# Patient Record
Sex: Female | Born: 1965 | Race: White | Hispanic: No | Marital: Married | Smoking: Never smoker
Health system: Southern US, Community
[De-identification: ages and names within clinical notes are randomized; demographics above are authoritative.]

## PROBLEM LIST (undated history)

## (undated) DIAGNOSIS — R195 Other fecal abnormalities: Secondary | ICD-10-CM

## (undated) DIAGNOSIS — C799 Secondary malignant neoplasm of unspecified site: Secondary | ICD-10-CM

## (undated) DIAGNOSIS — K5790 Diverticulosis of intestine, part unspecified, without perforation or abscess without bleeding: Secondary | ICD-10-CM

## (undated) DIAGNOSIS — C801 Malignant (primary) neoplasm, unspecified: Secondary | ICD-10-CM

## (undated) DIAGNOSIS — D259 Leiomyoma of uterus, unspecified: Secondary | ICD-10-CM

## (undated) DIAGNOSIS — K635 Polyp of colon: Secondary | ICD-10-CM

## (undated) DIAGNOSIS — K631 Perforation of intestine (nontraumatic): Secondary | ICD-10-CM

## (undated) DIAGNOSIS — K449 Diaphragmatic hernia without obstruction or gangrene: Secondary | ICD-10-CM

## (undated) DIAGNOSIS — R102 Pelvic and perineal pain: Secondary | ICD-10-CM

## (undated) HISTORY — PX: ESOPHAGOGASTRODUODENOSCOPY: SHX1529

## (undated) HISTORY — PX: COLONOSCOPY: SHX174

## (undated) HISTORY — DX: Secondary malignant neoplasm of unspecified site: C79.9

## (undated) HISTORY — DX: Malignant (primary) neoplasm, unspecified: C80.1

## (undated) HISTORY — DX: Pelvic and perineal pain: R10.2

## (undated) HISTORY — DX: Perforation of intestine (nontraumatic): K63.1

## (undated) HISTORY — PX: TONSILLECTOMY: SUR1361

## (undated) HISTORY — DX: Other fecal abnormalities: R19.5

---

## 2004-06-21 ENCOUNTER — Ambulatory Visit: Payer: Self-pay | Admitting: Obstetrics and Gynecology

## 2004-07-03 ENCOUNTER — Ambulatory Visit: Payer: Self-pay | Admitting: Obstetrics and Gynecology

## 2005-04-08 ENCOUNTER — Emergency Department: Payer: Self-pay | Admitting: Emergency Medicine

## 2005-04-08 ENCOUNTER — Other Ambulatory Visit: Payer: Self-pay

## 2005-08-09 ENCOUNTER — Ambulatory Visit: Payer: Self-pay | Admitting: Obstetrics and Gynecology

## 2006-07-24 ENCOUNTER — Ambulatory Visit: Payer: Self-pay

## 2007-08-17 ENCOUNTER — Ambulatory Visit: Payer: Self-pay | Admitting: Obstetrics and Gynecology

## 2008-07-14 ENCOUNTER — Ambulatory Visit: Payer: Self-pay | Admitting: Obstetrics and Gynecology

## 2009-07-18 ENCOUNTER — Ambulatory Visit: Payer: Self-pay | Admitting: Obstetrics and Gynecology

## 2010-07-25 ENCOUNTER — Ambulatory Visit: Payer: Self-pay | Admitting: Obstetrics and Gynecology

## 2011-07-30 ENCOUNTER — Ambulatory Visit: Payer: Self-pay | Admitting: Obstetrics and Gynecology

## 2012-07-30 ENCOUNTER — Ambulatory Visit: Payer: Self-pay | Admitting: Obstetrics and Gynecology

## 2013-08-02 ENCOUNTER — Ambulatory Visit: Payer: Self-pay | Admitting: Obstetrics and Gynecology

## 2014-08-01 DIAGNOSIS — R195 Other fecal abnormalities: Secondary | ICD-10-CM

## 2014-08-01 HISTORY — DX: Other fecal abnormalities: R19.5

## 2014-08-03 ENCOUNTER — Ambulatory Visit: Payer: Self-pay | Admitting: Obstetrics and Gynecology

## 2014-08-22 ENCOUNTER — Ambulatory Visit: Payer: Self-pay | Admitting: Gastroenterology

## 2014-12-19 LAB — SURGICAL PATHOLOGY

## 2015-03-15 ENCOUNTER — Emergency Department
Admission: EM | Admit: 2015-03-15 | Discharge: 2015-03-15 | Disposition: A | Discharge status: Home / Self Care | Payer: Managed Care, Other (non HMO) | Attending: Emergency Medicine | Admitting: Emergency Medicine

## 2015-03-15 ENCOUNTER — Encounter: Payer: Self-pay | Admitting: Urgent Care

## 2015-03-15 DIAGNOSIS — Z880 Allergy status to penicillin: Secondary | ICD-10-CM | POA: Diagnosis not present

## 2015-03-15 DIAGNOSIS — R197 Diarrhea, unspecified: Secondary | ICD-10-CM | POA: Diagnosis not present

## 2015-03-15 DIAGNOSIS — R1033 Periumbilical pain: Secondary | ICD-10-CM | POA: Insufficient documentation

## 2015-03-15 DIAGNOSIS — R110 Nausea: Secondary | ICD-10-CM | POA: Insufficient documentation

## 2015-03-15 LAB — CBC WITH DIFFERENTIAL/PLATELET
BASOS ABS: 0 10*3/uL (ref 0–0.1)
Basophils Relative: 0 %
EOS ABS: 0 10*3/uL (ref 0–0.7)
Eosinophils Relative: 0 %
HEMATOCRIT: 39 % (ref 35.0–47.0)
HEMOGLOBIN: 13.2 g/dL (ref 12.0–16.0)
LYMPHS ABS: 0.7 10*3/uL — AB (ref 1.0–3.6)
LYMPHS PCT: 8 %
MCH: 33.5 pg (ref 26.0–34.0)
MCHC: 33.8 g/dL (ref 32.0–36.0)
MCV: 98.9 fL (ref 80.0–100.0)
MONOS PCT: 4 %
Monocytes Absolute: 0.4 10*3/uL (ref 0.2–0.9)
Neutro Abs: 7.9 10*3/uL — ABNORMAL HIGH (ref 1.4–6.5)
Neutrophils Relative %: 88 %
Platelets: 250 10*3/uL (ref 150–440)
RBC: 3.94 MIL/uL (ref 3.80–5.20)
RDW: 12.7 % (ref 11.5–14.5)
WBC: 9.1 10*3/uL (ref 3.6–11.0)

## 2015-03-15 LAB — URINALYSIS COMPLETE WITH MICROSCOPIC (ARMC ONLY)
Bacteria, UA: NONE SEEN
Bilirubin Urine: NEGATIVE
Glucose, UA: NEGATIVE mg/dL
LEUKOCYTES UA: NEGATIVE
Nitrite: NEGATIVE
PH: 5 (ref 5.0–8.0)
PROTEIN: NEGATIVE mg/dL
SPECIFIC GRAVITY, URINE: 1.015 (ref 1.005–1.030)

## 2015-03-15 LAB — COMPREHENSIVE METABOLIC PANEL
ALBUMIN: 4 g/dL (ref 3.5–5.0)
ALT: 24 U/L (ref 14–54)
AST: 27 U/L (ref 15–41)
Alkaline Phosphatase: 39 U/L (ref 38–126)
Anion gap: 9 (ref 5–15)
BUN: 10 mg/dL (ref 6–20)
CO2: 27 mmol/L (ref 22–32)
CREATININE: 0.82 mg/dL (ref 0.44–1.00)
Calcium: 8.9 mg/dL (ref 8.9–10.3)
Chloride: 101 mmol/L (ref 101–111)
GFR calc Af Amer: >60 mL/min (ref >60)
GFR calc non Af Amer: >60 mL/min (ref >60)
Glucose, Bld: 109 mg/dL — ABNORMAL HIGH (ref 65–99)
Potassium: 3.4 mmol/L — ABNORMAL LOW (ref 3.5–5.1)
Sodium: 137 mmol/L (ref 135–145)
Total Bilirubin: 0.6 mg/dL (ref 0.3–1.2)
Total Protein: 7.3 g/dL (ref 6.5–8.1)

## 2015-03-15 LAB — LIPASE, BLOOD: LIPASE: 28 U/L (ref 22–51)

## 2015-03-15 MED ORDER — DICYCLOMINE HCL 20 MG PO TABS: 20.0000 mg | ORAL_TABLET | Freq: Three times a day (TID) | ORAL | Status: DC | PRN

## 2015-03-15 NOTE — Discharge Instructions (Signed)
Please seek medical attention for any high fevers, chest pain, shortness of breath, change in behavior, persistent vomiting, bloody stool or any other new or concerning symptoms.  Abdominal Pain Many things can cause abdominal pain. Usually, abdominal pain is not caused by a disease and will improve without treatment. It can often be observed and treated at home. Your health care provider will do a physical exam and possibly order blood tests and X-rays to help determine the seriousness of your pain. However, in many cases, more time must pass before a clear cause of the pain can be found. Before that point, your health care provider may not know if you need more testing or further treatment. HOME CARE INSTRUCTIONS  Monitor your abdominal pain for any changes. The following actions may help to alleviate any discomfort you are experiencing:  Only take over-the-counter or prescription medicines as directed by your health care provider.  Do not take laxatives unless directed to do so by your health care provider.  Try a clear liquid diet (broth, tea, or water) as directed by your health care provider. Slowly move to a bland diet as tolerated. SEEK MEDICAL CARE IF:  You have unexplained abdominal pain.  You have abdominal pain associated with nausea or diarrhea.  You have pain when you urinate or have a bowel movement.  You experience abdominal pain that wakes you in the night.  You have abdominal pain that is worsened or improved by eating food.  You have abdominal pain that is worsened with eating fatty foods.  You have a fever. SEEK IMMEDIATE MEDICAL CARE IF:   Your pain does not go away within 2 hours.  You keep throwing up (vomiting).  Your pain is felt only in portions of the abdomen, such as the right side or the left lower portion of the abdomen.  You pass bloody or black tarry stools. MAKE SURE YOU:  Understand these instructions.   Will watch your condition.   Will  get help right away if you are not doing well or get worse.  Document Released: 05/22/2005 Document Revised: 08/17/2013 Document Reviewed: 04/21/2013 [facility] Patient Information 2015 [place], [place]. This information is not intended to replace advice given to you by your health care provider. Make sure you discuss any questions you have with your health care provider.

## 2015-03-15 NOTE — ED Notes (Signed)
Patient presents with c/o mid-abdominal pain that began earlier today. (+) diarrhea and nausea. Denies urinary symptoms.

## 2015-03-15 NOTE — ED Provider Notes (Signed)
[facility] Emergency Department Provider Note   ____________________________________________  Time seen: 1925  I have reviewed the triage vital signs and the nursing notes.   HISTORY  Chief Complaint Abdominal Pain and Diarrhea   History limited by: Not Limited   HPI Anna [name] is a 49 y.o. female who presents to the emergency department today because of concerns of abdominal pain and some nausea. The patient states that the pain started early this morning. She states it is located periumbilically. It was mild. She was able to go to work. She states towards the end of work and started to get worse. She did note 3 episodes of diarrhea today. These were all nonbloody. Patient states the pain never changed location or radiated. She denies similar pain in the past. Denies any recent fevers. Denies any recent travel or unusual oral intake.    History reviewed. No pertinent past medical history.  There are no active problems to display for this patient.   Past Surgical History  Procedure Laterality Date  . Tonsillectomy      No current outpatient prescriptions on file.  Allergies Penicillins  No family history on file.  Social History History  Substance Use Topics  . Smoking status: Never Smoker   . Smokeless tobacco: Not on file  . Alcohol Use: No    Review of Systems  Constitutional: Negative for fever. Cardiovascular: Negative for chest pain. Respiratory: Negative for shortness of breath. Gastrointestinal: Positive for periumbilical pain, nausea and diarrhea Genitourinary: Negative for dysuria. Musculoskeletal: Negative for back pain. Skin: Negative for rash. Neurological: Negative for headaches, focal weakness or numbness.   10-point ROS otherwise negative.  ____________________________________________   PHYSICAL EXAM:  VITAL SIGNS: ED Triage Vitals  Enc Vitals Group     BP 03/15/15 1857 153/89 mmHg     Pulse Rate  03/15/15 1857 105     Resp 03/15/15 1857 20     Temp 03/15/15 1857 98.3 F (36.8 C)     Temp Source 03/15/15 1857 Oral     SpO2 03/15/15 1857 100 %     Weight 03/15/15 1857 140 lb (63.504 kg)     Height 03/15/15 1857 5\' 3"  (1.6 m)     Head Cir --      Peak Flow --      Pain Score 03/15/15 1857 4   Constitutional: Alert and oriented. Well appearing and in no distress. Eyes: Conjunctivae are normal. PERRL. Normal extraocular movements. ENT   Head: Normocephalic and atraumatic.   Nose: No congestion/rhinnorhea.   Mouth/Throat: Mucous membranes are moist.   Neck: No stridor. Hematological/Lymphatic/Immunilogical: No cervical lymphadenopathy. Cardiovascular: Normal rate, regular rhythm.  No murmurs, rubs, or gallops. Respiratory: Normal respiratory effort without tachypnea nor retractions. Breath sounds are clear and equal bilaterally. No wheezes/rales/rhonchi. Gastrointestinal: Soft and nontender. No rebound. No guarding. No distention. There is no CVA tenderness. Genitourinary: Deferred Musculoskeletal: Normal range of motion in all extremities. No joint effusions.  No lower extremity tenderness nor edema. Neurologic:  Normal speech and language. No gross focal neurologic deficits are appreciated. Speech is normal.  Skin:  Skin is warm, dry and intact. No rash noted. Psychiatric: Mood and affect are normal. Speech and behavior are normal. Patient exhibits appropriate insight and judgment.  ____________________________________________    LABS (pertinent positives/negatives)  Labs Reviewed  CBC WITH DIFFERENTIAL/PLATELET - Abnormal; Notable for the following:    Neutro Abs 7.9 (*)    Lymphs Abs 0.7 (*)    All  other components within normal limits  COMPREHENSIVE METABOLIC PANEL - Abnormal; Notable for the following:    Potassium 3.4 (*)    Glucose, Bld 109 (*)    All other components within normal limits  URINALYSIS COMPLETEWITH MICROSCOPIC ([facility] ONLY) - Abnormal;  Notable for the following:    Color, Urine YELLOW (*)    APPearance CLEAR (*)    Ketones, ur 1+ (*)    Hgb urine dipstick 2+ (*)    Squamous Epithelial / LPF 0-5 (*)    All other components within normal limits  LIPASE, BLOOD     ____________________________________________   EKG  None  ____________________________________________    RADIOLOGY  None  ____________________________________________   PROCEDURES  Procedure(s) performed: None  Critical Care performed: No  ____________________________________________   INITIAL IMPRESSION / ASSESSMENT AND PLAN / ED COURSE  Pertinent labs & imaging results that were available during my care of the patient were reviewed by me and considered in my medical decision making (see chart for details).  Patient presents to the emergency department today because of abdominal pain, nausea and diarrhea. Physical exam benign without any concerning findings. Will recheck basic blood work and urine.  No concerning findings on blood or urine. Patient without any recurrence pain. Will plan on discharging home. Discussed return precautions with patient.  ____________________________________________   FINAL CLINICAL IMPRESSION(S) / ED DIAGNOSES  Final diagnoses:  Periumbilical abdominal pain     Nance Pear, MD 03/15/15 2117

## 2015-06-15 ENCOUNTER — Other Ambulatory Visit: Payer: Self-pay | Admitting: Obstetrics and Gynecology

## 2015-06-15 DIAGNOSIS — Z1231 Encounter for screening mammogram for malignant neoplasm of breast: Secondary | ICD-10-CM

## 2015-08-07 ENCOUNTER — Ambulatory Visit
Admission: RE | Admit: 2015-08-07 | Discharge: 2015-08-07 | Disposition: A | Discharge status: Home / Self Care | Payer: Managed Care, Other (non HMO) | Source: Ambulatory Visit | Attending: Obstetrics and Gynecology | Admitting: Obstetrics and Gynecology

## 2015-08-07 DIAGNOSIS — Z1231 Encounter for screening mammogram for malignant neoplasm of breast: Secondary | ICD-10-CM | POA: Diagnosis not present

## 2016-07-08 ENCOUNTER — Other Ambulatory Visit: Payer: Self-pay | Admitting: Obstetrics and Gynecology

## 2016-07-08 DIAGNOSIS — Z1231 Encounter for screening mammogram for malignant neoplasm of breast: Secondary | ICD-10-CM

## 2016-08-14 ENCOUNTER — Ambulatory Visit
Admission: RE | Admit: 2016-08-14 | Discharge: 2016-08-14 | Disposition: A | Discharge status: Home / Self Care | Payer: Managed Care, Other (non HMO) | Source: Ambulatory Visit | Attending: Obstetrics and Gynecology | Admitting: Obstetrics and Gynecology

## 2016-08-14 DIAGNOSIS — Z1231 Encounter for screening mammogram for malignant neoplasm of breast: Secondary | ICD-10-CM | POA: Diagnosis not present

## 2016-12-11 ENCOUNTER — Encounter
Admission: RE | Admit: 2016-12-11 | Discharge: 2016-12-11 | Disposition: A | Discharge status: Home / Self Care | Payer: Managed Care, Other (non HMO) | Source: Ambulatory Visit | Attending: Obstetrics and Gynecology | Admitting: Obstetrics and Gynecology

## 2016-12-11 DIAGNOSIS — Z0183 Encounter for blood typing: Secondary | ICD-10-CM | POA: Insufficient documentation

## 2016-12-11 DIAGNOSIS — Z01812 Encounter for preprocedural laboratory examination: Secondary | ICD-10-CM | POA: Diagnosis present

## 2016-12-11 DIAGNOSIS — N83292 Other ovarian cyst, left side: Secondary | ICD-10-CM | POA: Diagnosis not present

## 2016-12-11 HISTORY — DX: Polyp of colon: K63.5

## 2016-12-11 HISTORY — DX: Diverticulosis of intestine, part unspecified, without perforation or abscess without bleeding: K57.90

## 2016-12-11 HISTORY — DX: Diaphragmatic hernia without obstruction or gangrene: K44.9

## 2016-12-11 HISTORY — DX: Leiomyoma of uterus, unspecified: D25.9

## 2016-12-11 LAB — BASIC METABOLIC PANEL
Anion gap: 10 (ref 5–15)
BUN: 9 mg/dL (ref 6–20)
CHLORIDE: 100 mmol/L — AB (ref 101–111)
CO2: 30 mmol/L (ref 22–32)
Calcium: 9.3 mg/dL (ref 8.9–10.3)
Creatinine, Ser: 0.71 mg/dL (ref 0.44–1.00)
GFR calc non Af Amer: >60 mL/min (ref >60)
Glucose, Bld: 87 mg/dL (ref 65–99)
POTASSIUM: 3.4 mmol/L — AB (ref 3.5–5.1)
Sodium: 140 mmol/L (ref 135–145)

## 2016-12-11 LAB — CBC
HEMATOCRIT: 39 % (ref 35.0–47.0)
Hemoglobin: 13.1 g/dL (ref 12.0–16.0)
MCH: 31.4 pg (ref 26.0–34.0)
MCHC: 33.6 g/dL (ref 32.0–36.0)
MCV: 93.3 fL (ref 80.0–100.0)
Platelets: 444 10*3/uL — ABNORMAL HIGH (ref 150–440)
RBC: 4.18 MIL/uL (ref 3.80–5.20)
RDW: 12.3 % (ref 11.5–14.5)
WBC: 10.1 10*3/uL (ref 3.6–11.0)

## 2016-12-11 LAB — TYPE AND SCREEN
ABO/RH(D): A POS
Antibody Screen: NEGATIVE

## 2016-12-11 NOTE — H&P (Signed)
[name] is a 51 y.o. female here L/S LSO  And right salpingectomy . Follow up for Complex left ovarian mass ( 4.2 x 3.9 cm ). Ca125 , AFP , LDH estradiol all wnl . Mass is mainly solid . Morphology index =4 ( 0.3% cancer risk) Evaluation and Management of Ultrasonographically detected ovarian tumors in asymptomatic , [facility] vol 127, no.5, may 2016  Past Medical History:  has a past medical history of Colon polyp (08/22/14); Diverticulosis (08/22/14); Fibroid uterus; Fibroma of foot, right; and HH (hiatus hernia) (08/22/14).  Past Surgical History:  has a past surgical history that includes Tonsillectomy; Colonoscopy (08/22/14); and egd (08/22/14). Family History: family history includes Asthma in her father; Cancer in her mother; Colon cancer in her mother; Diabetes in her mother; High blood pressure (Hypertension) in her mother. Social History:  reports that she has never smoked. She has never used smokeless tobacco. She reports that she does not drink alcohol or use drugs. OB/GYN History:          OB History    Gravida Para Term Preterm AB Living   0 0 0 0 0 0   SAB TAB Ectopic Molar Multiple Live Births   0 0 0   0        Allergies: is allergic to penicillin. Medications:  Current Outpatient Prescriptions:  .  simethicone (MYLICON) 80 MG chewable tablet, Take 1 tablet (80 mg total) by mouth 4 (four) times daily., Disp: 120 tablet, Rfl: 2  Review of Systems: General:                      No fatigue or weight loss Eyes:                           No vision changes Ears:                            No hearing difficulty Respiratory:                No cough or shortness of breath Pulmonary:                  No asthma or shortness of breath Cardiovascular:           No chest pain, palpitations, dyspnea on exertion Gastrointestinal:          No abdominal bloating, chronic diarrhea, constipations, masses, pain or hematochezia Genitourinary:             No  hematuria, dysuria, abnormal vaginal discharge, pelvic pain, Menometrorrhagia Lymphatic:                   No swollen lymph nodes Musculoskeletal:         No muscle weakness Neurologic:                  No extremity weakness, syncope, seizure disorder Psychiatric:                  No history of depression, delusions or suicidal/homicidal ideation    Exam:      Vitals:   11/28/16 1433  BP: (!) 145/96  Pulse: 89    Body mass index is 25.86 kg/m.  WDWN white/ female in NAD   Lungs: CTA  CV : RRR without murmur   Pelvic : V/V : nl cx no lesions  UTX : NSSC  No adnexal mass    Impression:   The encounter diagnosis was Complex cyst of left ovary. Mainly solid  Normal tumor marker   Plan:    L/S LSO and right salpingectomy possible BSO Pt leaning towards former and will  .   risks of the procedure discussed with the pt   .  Caroline Sauger, MD       Electronically signed by Caroline Sauger, MD at 11/28/2016 5:14 PM      Office Visit on 11/28/2016       Note viewed by patient  Department   Name Address Phone Fax  [facility] [address]

## 2016-12-11 NOTE — Patient Instructions (Signed)
  Your procedure is scheduled on: Friday, December 20, 2016 Report to Same Day Surgery 2nd floor medical mall ([place] Entrance-take elevator on left to 2nd floor.  Check in with surgery information desk.) To find out your arrival time please call [phone] between 1PM - 3PM on Thursday, April 26th  Remember: Instructions that are not followed completely may result in serious medical risk, up to and including death, or upon the discretion of your surgeon and anesthesiologist your surgery may need to be rescheduled.    _x___ 1. Do not eat food or drink liquids after midnight. No gum chewing or hard candies.      __x__ 2. No Alcohol for 24 hours before or after surgery.   __x__3. No Smoking for 24 prior to surgery.   ____  4. Bring all medications with you on the day of surgery if instructed.    __x__ 5. Notify your doctor if there is any change in your medical condition     (cold, fever, infections).     Do not wear jewelry, make-up, hairpins, clips or nail polish.  Do not wear lotions, powders, or perfumes. You may wear deodorant.  Do not shave 48 hours prior to surgery. Men may shave face and neck.  Do not bring valuables to the hospital.    [facility] is not responsible for any belongings or valuables.               Contacts, dentures or bridgework may not be worn into surgery.     Patients discharged the day of surgery will not be allowed to drive home.  You will need someone to drive you home and stay with you the night of your procedure.    Please read over the following fact sheets that you were given:   [facility] Preparing for Surgery and or MRSA Information   _x___ Take no medications the morning of surgery     _x___ Use CHG Soap or sage wipes as directed on instruction sheet   X____Stop Anti-inflammatories such as Advil, Aleve, Ibuprofen, Motrin, Naproxen, Naprosyn, Goodies powders or aspirin products. OK to take Tylenol.   _x___ Stop supplements until after  surgery.

## 2016-12-20 ENCOUNTER — Encounter
Admission: AD | Disposition: A | Discharge status: Home / Self Care | Payer: Self-pay | Source: Ambulatory Visit | Attending: Obstetrics and Gynecology

## 2016-12-20 ENCOUNTER — Ambulatory Visit: Payer: Managed Care, Other (non HMO) | Admitting: Anesthesiology

## 2016-12-20 ENCOUNTER — Inpatient Hospital Stay
Admission: AD | Admit: 2016-12-20 | Discharge: 2016-12-21 | DRG: 743 | Disposition: A | Discharge status: Home / Self Care | Payer: Managed Care, Other (non HMO) | Source: Ambulatory Visit | Attending: Obstetrics and Gynecology | Admitting: Obstetrics and Gynecology

## 2016-12-20 ENCOUNTER — Encounter: Payer: Self-pay | Admitting: *Deleted

## 2016-12-20 DIAGNOSIS — D259 Leiomyoma of uterus, unspecified: Secondary | ICD-10-CM | POA: Diagnosis present

## 2016-12-20 DIAGNOSIS — Z880 Allergy status to penicillin: Secondary | ICD-10-CM | POA: Diagnosis not present

## 2016-12-20 DIAGNOSIS — N83292 Other ovarian cyst, left side: Principal | ICD-10-CM | POA: Diagnosis present

## 2016-12-20 DIAGNOSIS — C799 Secondary malignant neoplasm of unspecified site: Secondary | ICD-10-CM | POA: Diagnosis present

## 2016-12-20 DIAGNOSIS — C7962 Secondary malignant neoplasm of left ovary: Secondary | ICD-10-CM | POA: Diagnosis not present

## 2016-12-20 DIAGNOSIS — C786 Secondary malignant neoplasm of retroperitoneum and peritoneum: Secondary | ICD-10-CM | POA: Diagnosis not present

## 2016-12-20 DIAGNOSIS — N736 Female pelvic peritoneal adhesions (postinfective): Secondary | ICD-10-CM | POA: Diagnosis present

## 2016-12-20 DIAGNOSIS — R102 Pelvic and perineal pain: Secondary | ICD-10-CM

## 2016-12-20 DIAGNOSIS — N7093 Salpingitis and oophoritis, unspecified: Secondary | ICD-10-CM | POA: Diagnosis present

## 2016-12-20 DIAGNOSIS — C801 Malignant (primary) neoplasm, unspecified: Secondary | ICD-10-CM

## 2016-12-20 HISTORY — PX: SIGMOIDOSCOPY: SHX6686

## 2016-12-20 HISTORY — PX: LAPAROSCOPIC LYSIS OF ADHESIONS: SHX5905

## 2016-12-20 HISTORY — DX: Secondary malignant neoplasm of unspecified site: C79.9

## 2016-12-20 HISTORY — PX: CYSTOSCOPY: SHX5120

## 2016-12-20 HISTORY — PX: LAPAROSCOPIC BILATERAL SALPINGECTOMY: SHX5889

## 2016-12-20 LAB — ABO/RH: ABO/RH(D): A POS

## 2016-12-20 SURGERY — SALPINGECTOMY, BILATERAL, LAPAROSCOPIC
Anesthesia: General | Laterality: Left

## 2016-12-20 MED ORDER — SUGAMMADEX SODIUM 200 MG/2ML IV SOLN
INTRAVENOUS | Status: AC
  Filled 2016-12-20: qty 2

## 2016-12-20 MED ORDER — BUPIVACAINE HCL 0.5 % IJ SOLN
INTRAMUSCULAR | Status: DC | PRN
  Administered 2016-12-20: 13 mL

## 2016-12-20 MED ORDER — DEXTROSE 5 % IV SOLN
5.0000 mg/kg | INTRAVENOUS | Status: DC
  Administered 2016-12-20: 300 mg via INTRAVENOUS
  Filled 2016-12-20 (×2): qty 7.5

## 2016-12-20 MED ORDER — PHENYLEPHRINE HCL 10 MG/ML IJ SOLN
INTRAMUSCULAR | Status: AC
  Filled 2016-12-20: qty 1

## 2016-12-20 MED ORDER — FAMOTIDINE 20 MG PO TABS
20.0000 mg | ORAL_TABLET | Freq: Once | ORAL | Status: AC
  Administered 2016-12-20: 20 mg via ORAL

## 2016-12-20 MED ORDER — DEXAMETHASONE SODIUM PHOSPHATE 10 MG/ML IJ SOLN
INTRAMUSCULAR | Status: DC | PRN
  Administered 2016-12-20: 10 mg via INTRAVENOUS

## 2016-12-20 MED ORDER — FENTANYL CITRATE (PF) 100 MCG/2ML IJ SOLN
INTRAMUSCULAR | Status: AC
  Filled 2016-12-20: qty 2

## 2016-12-20 MED ORDER — LACTATED RINGERS IV SOLN
INTRAVENOUS | Status: DC
  Administered 2016-12-21: via INTRAVENOUS

## 2016-12-20 MED ORDER — KETOROLAC TROMETHAMINE 30 MG/ML IJ SOLN
INTRAMUSCULAR | Status: DC | PRN
  Administered 2016-12-20: 30 mg via INTRAVENOUS

## 2016-12-20 MED ORDER — ONDANSETRON HCL 4 MG/2ML IJ SOLN: 4.0000 mg | Freq: Once | INTRAMUSCULAR | Status: DC | PRN

## 2016-12-20 MED ORDER — HYDROMORPHONE HCL 1 MG/ML IJ SOLN
INTRAMUSCULAR | Status: AC
  Filled 2016-12-20: qty 1

## 2016-12-20 MED ORDER — ONDANSETRON HCL 4 MG/2ML IJ SOLN
INTRAMUSCULAR | Status: DC | PRN
  Administered 2016-12-20: 4 mg via INTRAVENOUS

## 2016-12-20 MED ORDER — FLUORESCEIN SODIUM 10 % IV SOLN
INTRAVENOUS | Status: DC | PRN
Start: 2016-12-20 — End: 2016-12-20
  Administered 2016-12-20: 50 mg via INTRAVENOUS

## 2016-12-20 MED ORDER — HYDROMORPHONE HCL 1 MG/ML IJ SOLN
INTRAMUSCULAR | Status: DC | PRN
  Administered 2016-12-20 (×3): 0.5 mg via INTRAVENOUS

## 2016-12-20 MED ORDER — OXYCODONE HCL 5 MG PO TABS
5.0000 mg | ORAL_TABLET | ORAL | Status: DC | PRN
  Administered 2016-12-21 (×2): 5 mg via ORAL
  Filled 2016-12-20 (×2): qty 1

## 2016-12-20 MED ORDER — ONDANSETRON HCL 4 MG/2ML IJ SOLN
INTRAMUSCULAR | Status: AC
  Filled 2016-12-20: qty 2

## 2016-12-20 MED ORDER — LIDOCAINE HCL (PF) 2 % IJ SOLN
INTRAMUSCULAR | Status: AC
  Filled 2016-12-20: qty 2

## 2016-12-20 MED ORDER — FENTANYL CITRATE (PF) 100 MCG/2ML IJ SOLN
INTRAMUSCULAR | Status: DC | PRN
  Administered 2016-12-20 (×4): 50 ug via INTRAVENOUS

## 2016-12-20 MED ORDER — MORPHINE SULFATE (PF) 4 MG/ML IV SOLN: 2.0000 mg | INTRAVENOUS | Status: DC | PRN

## 2016-12-20 MED ORDER — PROPOFOL 10 MG/ML IV BOLUS
INTRAVENOUS | Status: AC
  Filled 2016-12-20: qty 20

## 2016-12-20 MED ORDER — GLYCOPYRROLATE 0.2 MG/ML IJ SOLN
INTRAMUSCULAR | Status: AC
  Filled 2016-12-20: qty 1

## 2016-12-20 MED ORDER — FENTANYL CITRATE (PF) 100 MCG/2ML IJ SOLN: 25.0000 ug | INTRAMUSCULAR | Status: DC | PRN

## 2016-12-20 MED ORDER — SILVER NITRATE-POT NITRATE 75-25 % EX MISC
CUTANEOUS | Status: AC
  Filled 2016-12-20: qty 2

## 2016-12-20 MED ORDER — PHENYLEPHRINE HCL 10 MG/ML IJ SOLN
INTRAMUSCULAR | Status: DC | PRN
  Administered 2016-12-20: 100 ug via INTRAVENOUS

## 2016-12-20 MED ORDER — DEXAMETHASONE SODIUM PHOSPHATE 10 MG/ML IJ SOLN
INTRAMUSCULAR | Status: AC
  Filled 2016-12-20: qty 1

## 2016-12-20 MED ORDER — ACETAMINOPHEN 325 MG PO TABS: 650.0000 mg | ORAL_TABLET | ORAL | Status: DC | PRN

## 2016-12-20 MED ORDER — ROCURONIUM BROMIDE 50 MG/5ML IV SOLN
INTRAVENOUS | Status: AC
  Filled 2016-12-20: qty 1

## 2016-12-20 MED ORDER — EPHEDRINE SULFATE 50 MG/ML IJ SOLN
INTRAMUSCULAR | Status: AC
  Filled 2016-12-20: qty 1

## 2016-12-20 MED ORDER — FLUORESCEIN SODIUM 10 % IV SOLN
INTRAVENOUS | Status: AC
  Filled 2016-12-20: qty 5

## 2016-12-20 MED ORDER — MIDAZOLAM HCL 2 MG/2ML IJ SOLN
INTRAMUSCULAR | Status: AC
  Filled 2016-12-20: qty 2

## 2016-12-20 MED ORDER — PROPOFOL 10 MG/ML IV BOLUS
INTRAVENOUS | Status: DC | PRN
  Administered 2016-12-20: 130 mg via INTRAVENOUS

## 2016-12-20 MED ORDER — FAMOTIDINE 20 MG PO TABS
ORAL_TABLET | ORAL | Status: AC
  Filled 2016-12-20: qty 1

## 2016-12-20 MED ORDER — SUCCINYLCHOLINE CHLORIDE 20 MG/ML IJ SOLN
INTRAMUSCULAR | Status: AC
  Filled 2016-12-20: qty 1

## 2016-12-20 MED ORDER — SUGAMMADEX SODIUM 200 MG/2ML IV SOLN
INTRAVENOUS | Status: DC | PRN
  Administered 2016-12-20: 200 mg via INTRAVENOUS

## 2016-12-20 MED ORDER — LACTATED RINGERS IV SOLN
INTRAVENOUS | Status: DC
  Administered 2016-12-20: 06:00:00 via INTRAVENOUS

## 2016-12-20 MED ORDER — LIDOCAINE HCL (CARDIAC) 20 MG/ML IV SOLN
INTRAVENOUS | Status: DC | PRN
  Administered 2016-12-20: 60 mg via INTRAVENOUS

## 2016-12-20 MED ORDER — KETOROLAC TROMETHAMINE 30 MG/ML IJ SOLN
30.0000 mg | Freq: Three times a day (TID) | INTRAMUSCULAR | Status: DC | PRN
  Administered 2016-12-20: 30 mg via INTRAVENOUS
  Filled 2016-12-20: qty 1

## 2016-12-20 MED ORDER — KETOROLAC TROMETHAMINE 30 MG/ML IJ SOLN
INTRAMUSCULAR | Status: AC
  Filled 2016-12-20: qty 1

## 2016-12-20 MED ORDER — ACETAMINOPHEN NICU IV SYRINGE 10 MG/ML
INTRAVENOUS | Status: AC
  Filled 2016-12-20: qty 1

## 2016-12-20 MED ORDER — CLINDAMYCIN PHOSPHATE 900 MG/50ML IV SOLN
900.0000 mg | Freq: Three times a day (TID) | INTRAVENOUS | Status: DC
  Administered 2016-12-20 – 2016-12-21 (×3): 900 mg via INTRAVENOUS
  Filled 2016-12-20 (×5): qty 50

## 2016-12-20 MED ORDER — ACETAMINOPHEN 10 MG/ML IV SOLN
INTRAVENOUS | Status: DC | PRN
  Administered 2016-12-20: 1000 mg via INTRAVENOUS

## 2016-12-20 MED ORDER — BUPIVACAINE HCL (PF) 0.5 % IJ SOLN
INTRAMUSCULAR | Status: AC
  Filled 2016-12-20: qty 30

## 2016-12-20 MED ORDER — ROCURONIUM BROMIDE 100 MG/10ML IV SOLN
INTRAVENOUS | Status: DC | PRN
  Administered 2016-12-20: 10 mg via INTRAVENOUS
  Administered 2016-12-20: 30 mg via INTRAVENOUS
  Administered 2016-12-20: 10 mg via INTRAVENOUS
  Administered 2016-12-20: 15 mg via INTRAVENOUS
  Administered 2016-12-20 (×2): 5 mg via INTRAVENOUS
  Administered 2016-12-20: 10 mg via INTRAVENOUS

## 2016-12-20 MED ORDER — MIDAZOLAM HCL 2 MG/2ML IJ SOLN
INTRAMUSCULAR | Status: DC | PRN
  Administered 2016-12-20: 2 mg via INTRAVENOUS

## 2016-12-20 MED ORDER — PRENATAL MULTIVITAMIN CH
1.0000 | ORAL_TABLET | Freq: Every day | ORAL | Status: DC
  Administered 2016-12-20: 1 via ORAL
  Filled 2016-12-20: qty 1

## 2016-12-20 SURGICAL SUPPLY — 46 items
BAG URO DRAIN 2000ML W/SPOUT (MISCELLANEOUS) ×5 IMPLANT
BLADE SURG SZ11 CARB STEEL (BLADE) ×5 IMPLANT
CANISTER SUCT 1200ML W/VALVE (MISCELLANEOUS) ×5 IMPLANT
CATH ROBINSON RED A/P 16FR (CATHETERS) ×5 IMPLANT
CHLORAPREP W/TINT 26ML (MISCELLANEOUS) ×5 IMPLANT
CLOSURE WOUND 1/4X4 (GAUZE/BANDAGES/DRESSINGS) ×1
DISSECTOR KITTNER STICK (MISCELLANEOUS) ×3 IMPLANT
DISSECTORS/KITTNER STICK (MISCELLANEOUS) ×5
DRAPE INCISE 23X17 IOBAN STRL (DRAPES) ×2
DRAPE INCISE IOBAN 23X17 STRL (DRAPES) ×3 IMPLANT
DRSG TEGADERM 2-3/8X2-3/4 SM (GAUZE/BANDAGES/DRESSINGS) ×5 IMPLANT
ENDOPOUCH RETRIEVER 10 (MISCELLANEOUS) ×10 IMPLANT
GAUZE SPONGE NON-WVN 2X2 STRL (MISCELLANEOUS) ×3 IMPLANT
GLOVE BIO SURGEON STRL SZ8 (GLOVE) ×10 IMPLANT
GOWN STRL REUS W/ TWL LRG LVL3 (GOWN DISPOSABLE) ×6 IMPLANT
GOWN STRL REUS W/ TWL XL LVL3 (GOWN DISPOSABLE) ×3 IMPLANT
GOWN STRL REUS W/TWL LRG LVL3 (GOWN DISPOSABLE) ×4
GOWN STRL REUS W/TWL XL LVL3 (GOWN DISPOSABLE) ×2
GRASPER SUT TROCAR 14GX15 (MISCELLANEOUS) ×5 IMPLANT
IRRIGATION STRYKERFLOW (MISCELLANEOUS) IMPLANT
IRRIGATOR STRYKERFLOW (MISCELLANEOUS)
IV NS 1000ML (IV SOLUTION) ×2
IV NS 1000ML BAXH (IV SOLUTION) ×3 IMPLANT
JACKSON PRATT 7MM (INSTRUMENTS) ×5 IMPLANT
KIT RM TURNOVER CYSTO AR (KITS) ×5 IMPLANT
LABEL OR SOLS (LABEL) ×5 IMPLANT
NS IRRIG 500ML POUR BTL (IV SOLUTION) ×5 IMPLANT
PACK GYN LAPAROSCOPIC (MISCELLANEOUS) ×5 IMPLANT
PAD OB MATERNITY 4.3X12.25 (PERSONAL CARE ITEMS) ×5 IMPLANT
PAD PREP 24X41 OB/GYN DISP (PERSONAL CARE ITEMS) ×5 IMPLANT
SCISSORS METZENBAUM CVD 33 (INSTRUMENTS) ×5 IMPLANT
SET CYSTO W/LG BORE CLAMP LF (SET/KITS/TRAYS/PACK) ×5 IMPLANT
SHEARS HARMONIC ACE PLUS 36CM (ENDOMECHANICALS) ×5 IMPLANT
SLEEVE ENDOPATH XCEL 5M (ENDOMECHANICALS) ×5 IMPLANT
SPONGE VERSALON 2X2 STRL (MISCELLANEOUS) ×2
STRIP CLOSURE SKIN 1/4X4 (GAUZE/BANDAGES/DRESSINGS) ×4 IMPLANT
SUT VIC AB 0 CT2 27 (SUTURE) ×10 IMPLANT
SUT VIC AB 1 CT1 36 (SUTURE) ×5 IMPLANT
SUT VIC AB 2-0 UR6 27 (SUTURE) IMPLANT
SUT VIC AB 4-0 SH 27 (SUTURE)
SUT VIC AB 4-0 SH 27XANBCTRL (SUTURE) IMPLANT
SWABSTK COMLB BENZOIN TINCTURE (MISCELLANEOUS) ×5 IMPLANT
TROCAR ENDO BLADELESS 11MM (ENDOMECHANICALS) ×5 IMPLANT
TROCAR XCEL NON-BLD 5MMX100MML (ENDOMECHANICALS) ×5 IMPLANT
TROCAR XCEL UNIV SLVE 11M 100M (ENDOMECHANICALS) ×5 IMPLANT
TUBING INSUFFLATOR HI FLOW (MISCELLANEOUS) ×5 IMPLANT

## 2016-12-20 NOTE — Progress Notes (Signed)
Patient ID: Anna [name], female   DOB: 1966/06/05, 51 y.o.   MRN: [id number] Reviewed case with patient  Her pain is in good control  VSS  drain : serosanginous fluid  Good urine output A: stable  P; advance diet  Anticipate d/c tomorrow

## 2016-12-20 NOTE — Anesthesia Preprocedure Evaluation (Signed)
Anesthesia Evaluation  Patient identified by MRN, date of birth, ID band Patient awake    Reviewed: Allergy & Precautions, NPO status , Patient's Chart, lab work & pertinent test results, reviewed documented beta blocker date and time   Airway Mallampati: II  TM Distance: >3 FB     Dental  (+) Chipped   Pulmonary           Cardiovascular      Neuro/Psych  Neuromuscular disease    GI/Hepatic   Endo/Other    Renal/GU      Musculoskeletal   Abdominal   Peds  Hematology   Anesthesia Other Findings   Reproductive/Obstetrics                             Anesthesia Physical Anesthesia Plan  ASA: II  Anesthesia Plan: General   Post-op Pain Management:    Induction: Intravenous  Airway Management Planned: Oral ETT  Additional Equipment:   Intra-op Plan:   Post-operative Plan:   Informed Consent: I have reviewed the patients History and Physical, chart, labs and discussed the procedure including the risks, benefits and alternatives for the proposed anesthesia with the patient or authorized representative who has indicated his/her understanding and acceptance.     Plan Discussed with: CRNA  Anesthesia Plan Comments:         Anesthesia Quick Evaluation

## 2016-12-20 NOTE — Brief Op Note (Signed)
12/20/2016  10:56 AM  PATIENT:  Anna [name]  51 y.o. female  PRE-OPERATIVE DIAGNOSIS: pelvic pain ,   Complex left ovarian cyst  POST-OPERATIVE DIAGNOSIS: complex adnexal mass if chronic infection   PROCEDURE:  Laparoscopic BSO                            Extensive adhesiolysis of right adnexa from inferior colon                             Excision of anterior 5 cm calcified uterine fibroid                             Rigid sigmoidoscopy- Dr Hampton Abbot                             Placement of [place] peritoneal drain                              Cystoscopy                               peritoneal washings SURGEON:  Surgeon(s) and Role: Panel 1:    * Boykin Nearing, MD - Primary    * Honor Loh Ward, MD first assist    * Olean Ree, MD - Gen surgery consultant Panel 2:    * Olean Ree, MD - Primary  PHYSICIAN ASSISTANT:   ASSISTANTS: none   ANESTHESIA:   general  EBL:  Total I/O In: 1200 [I.V.:1200] Out: 400 [Urine:300; Blood:100]  BLOOD ADMINISTERED:none  DRAINS: Urinary Catheter (Foley)   LOCAL MEDICATIONS USED:  MARCAINE    and Amount: 5 ml  SPECIMEN:  Source of Specimen:  right tube and ovary with infectious process, left tube and ovary , peritoneal biopsy , peritoneal washings, calcified uterine fibroid   DISPOSITION OF SPECIMEN:  PATHOLOGY  COUNTS:  YES  TOURNIQUET:  * No tourniquets in log *  DICTATION: .Other Dictation: Dictation Number verbal  PLAN OF CARE: Admit to inpatient   PATIENT DISPOSITION:  PACU - hemodynamically stable.   Delay start of Pharmacological VTE agent (>24hrs) due to surgical blood loss or risk of bleeding: not applicable

## 2016-12-20 NOTE — Anesthesia Procedure Notes (Signed)
Procedure Name: Intubation Date/Time: 12/20/2016 7:41 AM Performed by: Darlyne Russian Pre-anesthesia Checklist: Patient identified, Emergency Drugs available, Suction available, Patient being monitored and Timeout performed Patient Re-evaluated:Patient Re-evaluated prior to inductionOxygen Delivery Method: Circle system utilized Preoxygenation: Pre-oxygenation with 100% oxygen Intubation Type: IV induction Ventilation: Mask ventilation without difficulty Laryngoscope Size: Mac and 3 Grade View: Grade I Tube type: Oral Tube size: 7.0 mm Number of attempts: 1 Airway Equipment and Method: Stylet Placement Confirmation: ETT inserted through vocal cords under direct vision,  positive ETCO2 and breath sounds checked- equal and bilateral Secured at: 21 cm Tube secured with: Tape Dental Injury: Teeth and Oropharynx as per pre-operative assessment

## 2016-12-20 NOTE — Op Note (Signed)
  Procedure Date:  12/20/2016  Pre-operative Diagnosis:  Complex left ovarian mass  Post-operative Diagnosis:  Complex left ovarian mass  Procedure:  Laparoscopic adhesiolysis of the complex left ovarian mass to the distal sigmoid colon and rectum, excision of complex left ovarian mass, excision of uterine fibroid, rigid sigmoidoscopy  Surgeon:  Melvyn Neth, MD  Assistant:  Laverta Baltimore, MD.  Anesthesia:  General endotracheal  Estimated Blood Loss:  10 ml  Specimens:  Complex left ovarian mass, uterine fibroid  Complications:  None  Indications for Procedure:  This is a 51 y.o. female who presented with pelvic pain and a complex left ovarian mass.  She was in the operating room today with Dr. Ouida Sills and Dr. Leonides Schanz.  During laparoscopy, it was noted that the mass was significantly adhered to the distal sigmoid colon and rectum, and General Surgery was asked to come into room for assistance with adhesiolysis.  Description of Procedure: The patient was already intubated and prepped/draped.  The patient had an umbilical port for camera, a 12 mm left lower quadrant port, and a 5 mm right lower quadrant port.  An additional 5 mm right upper quadrant port was placed for an additional instrument.  The sigmoid colon was retracted cephalad, with counter-tension being provided on the left ovarian mass.  Using the suction-irrigator, blunt dissection was performed in the plane between the colon and mass.  Laparoscopic scissors were also used for sharp dissection.  There was a significant amount of fibrosis and scarring, concerning for a possible prior abscess formation.  As we were dissecting distally through the adhesion, the ovarian mass was mobilized anteriorly and superiorly in order to free it from the abdominal wall.  The Harmonic device was used to do that dissection.  Careful attention was given to avoid the left iliac vessels.  The left ureter was not visualized but our dissection  plane was away from its usual location and away from the retroperitoneum.  As the mass was mobilized further, any remaining adhesions to the intestine were freed.  On further inspection, there was no injury to the bowel noted. The mass then was evident to be free off the abdominal wall and intestine and then the Harmonic was used to dissect it off the uterine wall.  The uterine fibroid was then dissected off the anterior / superior uterine wall using the Harmonic device, and placed in an EndoCatch bag and removed via the left lower quadrant incision.    The patient was then placed in reverse Trendelenburg position and the pelvis was filled with saline.  A rigid sigmoidoscope was introduced and insufflation leak test was performed.  The sigmoid colon was noted to be distended with air, and there were no bubbles coming from the pelvis, consistent with no bowel injury.  The air was desufflated and the sigmoidoscope was removed.  The fluid in the pelvis was suctioned out.  At that point, Dr. Leonides Schanz scrubbed in to continue helping Dr. Ouida Sills and I scrubbed out, completing my portion of the surgery.   Melvyn Neth, MD

## 2016-12-20 NOTE — Progress Notes (Signed)
ANTIBIOTIC CONSULT NOTE - INITIAL  Pharmacy Consult for Gentamicin Indication: Tubo-ovarian abscess  Allergies  Allergen Reactions  . Penicillins Rash    As a child. Has patient had a PCN reaction causing immediate rash, facial/tongue/throat swelling, SOB or lightheadedness with hypotension: Unknown Has patient had a PCN reaction causing severe rash involving mucus membranes or skin necrosis: Unknown Has patient had a PCN reaction that required hospitalization: No Has patient had a PCN reaction occurring within the last 10 years: No If all of the above answers are "NO", then may proceed with Cephalosporin use.     Patient Measurements: Height: 5\' 3"  (160 cm) Weight: 134 lb (60.8 kg) IBW/kg (Calculated) : 52.4   Vital Signs: Temp: 97.8 F (36.6 C) (04/27 1300) Temp Source: Axillary (04/27 1300) BP: 132/63 (04/27 1300) Pulse Rate: 84 (04/27 1300) Intake/Output from previous day: No intake/output data recorded. Intake/Output from this shift: Total I/O In: 1200 [I.V.:1200] Out: 520 [Urine:390; Drains:30; Blood:100]  Labs: No results for input(s): WBC, HGB, PLT, LABCREA, CREATININE in the last 72 hours. Estimated Creatinine Clearance: 68 mL/min (by C-G formula based on SCr of 0.71 mg/dL). No results for input(s): VANCOTROUGH, VANCOPEAK, VANCORANDOM, GENTTROUGH, GENTPEAK, GENTRANDOM, TOBRATROUGH, TOBRAPEAK, TOBRARND, AMIKACINPEAK, AMIKACINTROU, AMIKACIN in the last 72 hours.   Microbiology: No results found for this or any previous visit (from the past 720 hour(s)).  Medical History: Past Medical History:  Diagnosis Date  . Colon polyp   . Diverticulosis   . Fibroid uterus   . HH (hiatus hernia)     Medications:  Anti-infectives    Start     Dose/Rate Route Frequency Ordered Stop   12/20/16 1400  clindamycin (CLEOCIN) IVPB 900 mg     900 mg 100 mL/hr over 30 Minutes Intravenous Every 8 hours 12/20/16 1116     12/20/16 1300  gentamicin (GARAMYCIN) 300 mg in  dextrose 5 % 100 mL IVPB     5 mg/kg  60.8 kg 107.5 mL/hr over 60 Minutes Intravenous Every 24 hours 12/20/16 1116       Assessment: 51 yo female admitted with pelvic pain, underwent Lap BSO for complex ovarian cyst w/ abscess  Goal of Therapy:  Gentamicin trough level <2 mcg/ml  Plan:  Treat with Gentamicin 5 mg/kg q24hr in combination with Clindamycin .   Will follow up SCr and check random Gentamicin Level 10 hrs after start.  Dalon Reichart K, RPh 12/20/2016,1:39 PM

## 2016-12-20 NOTE — Anesthesia Postprocedure Evaluation (Signed)
Anesthesia Post Note  Patient: Anna [name]  Procedure(s) Performed: Procedure(s) (LRB): LAPAROSCOPIC BILATERAL SALPINGOOPHERECTOMY,  Excision of anterior 5 cm calcified uterine fibroid (Bilateral) CYSTOSCOPY LAPAROSCOPIC LYSIS OF ADHESIONS WITH DISSECTION OF LEFT TUBE AND OVARY. RIGID SIGMOIDOSCOPY  Patient location during evaluation: PACU Anesthesia Type: General Level of consciousness: awake and alert Pain management: pain level controlled Vital Signs Assessment: post-procedure vital signs reviewed and stable Respiratory status: spontaneous breathing, nonlabored ventilation, respiratory function stable and patient connected to nasal cannula oxygen Cardiovascular status: blood pressure returned to baseline and stable Postop Assessment: no signs of nausea or vomiting Anesthetic complications: no     Last Vitals:  Vitals:   12/20/16 1459 12/20/16 1603  BP: (!) 114/56 (!) 105/57  Pulse: 79 79  Resp: 16 16  Temp: 36.6 C 36.8 C    Last Pain:  Vitals:   12/20/16 1603  TempSrc: Oral  PainSc:                  Cameran Pettey S

## 2016-12-20 NOTE — Progress Notes (Signed)
Ready ready for l/s LSO , right salpingectomy and possible right oophorectomy .  Marland Kitchen NPO . All questions answered . Proceed

## 2016-12-20 NOTE — Transfer of Care (Signed)
Immediate Anesthesia Transfer of Care Note  Patient: [place]  Procedure(s) Performed: Procedure(s): LAPAROSCOPIC BILATERAL SALPINGOOPHERECTOMY,  Excision of anterior 5 cm calcified uterine fibroid (Bilateral) CYSTOSCOPY LAPAROSCOPIC LYSIS OF ADHESIONS WITH DISSECTION OF LEFT TUBE AND OVARY. RIGID SIGMOIDOSCOPY  Patient Location: PACU  Anesthesia Type:General  Level of Consciousness: responds to stimulation  Airway & Oxygen Therapy: Patient Spontanous Breathing and Patient connected to nasal cannula oxygen  Post-op Assessment: Report given to RN and Post -op Vital signs reviewed and stable  Post vital signs: Reviewed and stable  Last Vitals:  Vitals:   12/20/16 1114 12/20/16 1117  BP:  128/64  Pulse: 88 90  Resp:  17  Temp:      Last Pain:  Vitals:   12/20/16 0600  TempSrc: Oral         Complications: No apparent anesthesia complications

## 2016-12-20 NOTE — OR Nursing (Signed)
No abx per Dr. Ouida Sills verbal @ [phone]

## 2016-12-20 NOTE — OR Nursing (Signed)
Lab tech in for abo/rh draw @ [phone]

## 2016-12-20 NOTE — Anesthesia Post-op Follow-up Note (Cosign Needed)
Anesthesia QCDR form completed.        

## 2016-12-21 LAB — COMPREHENSIVE METABOLIC PANEL
ALBUMIN: 2.9 g/dL — AB (ref 3.5–5.0)
ALT: 21 U/L (ref 14–54)
ANION GAP: 7 (ref 5–15)
AST: 26 U/L (ref 15–41)
Alkaline Phosphatase: 72 U/L (ref 38–126)
BUN: 7 mg/dL (ref 6–20)
CO2: 30 mmol/L (ref 22–32)
Calcium: 8.7 mg/dL — ABNORMAL LOW (ref 8.9–10.3)
Chloride: 100 mmol/L — ABNORMAL LOW (ref 101–111)
Creatinine, Ser: 0.65 mg/dL (ref 0.44–1.00)
GFR calc Af Amer: >60 mL/min (ref >60)
GFR calc non Af Amer: >60 mL/min (ref >60)
GLUCOSE: 93 mg/dL (ref 65–99)
POTASSIUM: 4.4 mmol/L (ref 3.5–5.1)
Sodium: 137 mmol/L (ref 135–145)
Total Bilirubin: 0.5 mg/dL (ref 0.3–1.2)
Total Protein: 6.3 g/dL — ABNORMAL LOW (ref 6.5–8.1)

## 2016-12-21 LAB — CBC WITH DIFFERENTIAL/PLATELET
BASOS ABS: 0.1 10*3/uL (ref 0–0.1)
Basophils Relative: 0 %
EOS PCT: 0 %
Eosinophils Absolute: 0 10*3/uL (ref 0–0.7)
HEMATOCRIT: 34 % — AB (ref 35.0–47.0)
Hemoglobin: 11.4 g/dL — ABNORMAL LOW (ref 12.0–16.0)
LYMPHS ABS: 0.7 10*3/uL — AB (ref 1.0–3.6)
LYMPHS PCT: 4 %
MCH: 31.4 pg (ref 26.0–34.0)
MCHC: 33.5 g/dL (ref 32.0–36.0)
MCV: 93.6 fL (ref 80.0–100.0)
MONO ABS: 1 10*3/uL — AB (ref 0.2–0.9)
Monocytes Relative: 5 %
NEUTROS ABS: 17.5 10*3/uL — AB (ref 1.4–6.5)
Neutrophils Relative %: 91 %
PLATELETS: 458 10*3/uL — AB (ref 150–440)
RBC: 3.64 MIL/uL — AB (ref 3.80–5.20)
RDW: 12.5 % (ref 11.5–14.5)
WBC: 19.4 10*3/uL — AB (ref 3.6–11.0)

## 2016-12-21 LAB — GENTAMICIN LEVEL, RANDOM: Gentamicin Rm: 0.7 ug/mL

## 2016-12-21 MED ORDER — DOCUSATE SODIUM 100 MG PO CAPS: 100.0000 mg | ORAL_CAPSULE | Freq: Two times a day (BID) | ORAL | 0 refills | Status: DC

## 2016-12-21 NOTE — Discharge Summary (Addendum)
Physician Discharge Summary  Patient ID: Anna [name] MRN: [id number] DOB/AGE: [birth year] 51 y.o.  Admit date: 12/20/2016 Discharge date: 12/21/2016  Admission Diagnoses:complex left ovarian cyst   Discharge Diagnoses: probable left tubovarian abscess Significant pelvic adhesive ds    Discharged Condition: good  Hospital Course: underwent L/S BSO , adhesiolysis colon to left adnexa  Drain removed POD#1 . Gentamycin and cleocin for 24 hrs  Consults: general surgery intra op Dr Hampton Abbot   Significant Diagnostic Studies: labs: Results for orders placed or performed during the hospital encounter of 12/20/16 (from the past 24 hour(s))  Gentamicin level, random     Status: None   Collection Time: 12/21/16  1:51 AM  Result Value Ref Range   Gentamicin Rm 0.7 ug/mL  Comprehensive metabolic panel     Status: Abnormal   Collection Time: 12/21/16  1:51 AM  Result Value Ref Range   Sodium 137 135 - 145 mmol/L   Potassium 4.4 3.5 - 5.1 mmol/L   Chloride 100 (L) 101 - 111 mmol/L   CO2 30 22 - 32 mmol/L   Glucose, Bld 93 65 - 99 mg/dL   BUN 7 6 - 20 mg/dL   Creatinine, Ser 0.65 0.44 - 1.00 mg/dL   Calcium 8.7 (L) 8.9 - 10.3 mg/dL   Total Protein 6.3 (L) 6.5 - 8.1 g/dL   Albumin 2.9 (L) 3.5 - 5.0 g/dL   AST 26 15 - 41 U/L   ALT 21 14 - 54 U/L   Alkaline Phosphatase 72 38 - 126 U/L   Total Bilirubin 0.5 0.3 - 1.2 mg/dL   GFR calc non Af Amer >60 >60 mL/min   GFR calc Af Amer >60 >60 mL/min   Anion gap 7 5 - 15  CBC WITH DIFFERENTIAL     Status: Abnormal   Collection Time: 12/21/16  1:51 AM  Result Value Ref Range   WBC 19.4 (H) 3.6 - 11.0 K/uL   RBC 3.64 (L) 3.80 - 5.20 MIL/uL   Hemoglobin 11.4 (L) 12.0 - 16.0 g/dL   HCT 34.0 (L) 35.0 - 47.0 %   MCV 93.6 80.0 - 100.0 fL   MCH 31.4 26.0 - 34.0 pg   MCHC 33.5 32.0 - 36.0 g/dL   RDW 12.5 11.5 - 14.5 %   Platelets 458 (H) 150 - 440 K/uL   Neutrophils Relative % 91 %   Neutro Abs 17.5 (H) 1.4 - 6.5 K/uL   Lymphocytes  Relative 4 %   Lymphs Abs 0.7 (L) 1.0 - 3.6 K/uL   Monocytes Relative 5 %   Monocytes Absolute 1.0 (H) 0.2 - 0.9 K/uL   Eosinophils Relative 0 %   Eosinophils Absolute 0.0 0 - 0.7 K/uL   Basophils Relative 0 %   Basophils Absolute 0.1 0 - 0.1 K/uL   Treatments:  Discharge Exam: Blood pressure (!) 101/51, pulse 81, temperature 98.3 F (36.8 C), temperature source Oral, resp. rate 18, height 5\' 3"  (1.6 m), weight 134 lb (60.8 kg), last menstrual period 03/01/2015, SpO2 95 %. General appearance: alert and cooperative Head: Normocephalic, without obvious abnormality, atraumatic Resp: clear to auscultation bilaterally Cardio: regular rate and rhythm, S1, S2 normal, no murmur, click, rub or gallop GI: hypoactive BS , non distended abd . Appropriate TTP   Disposition: 01-Home or Self Care   Already dispensed Naprosyn 500 mg BID and norco 5/325 1-2 q 4-6 prn pain  Colace 100 mg bid prn  miralax to prevent constipation    Pt is instructed  to call if increasing abdominal pain , fever, SOB . , diarrhea .  Regular easily digested food. No heavy lifting  Signed: Gwen Her Schermerhorn 12/21/2016, 9:51 AM

## 2016-12-21 NOTE — Progress Notes (Signed)
Patient discharged home with spouse. Discharge instructions, prescriptions and follow up appointment given to and reviewed with patient and spouse. Patient verbalized understanding. Escorted out via wheelchair by auxiliary.  

## 2016-12-21 NOTE — Progress Notes (Signed)
ANTIBIOTIC CONSULT NOTE - INITIAL  Pharmacy Consult for Gentamicin Indication: Tubo-ovarian abscess       Allergies  Allergen Reactions  . Penicillins Rash    As a child. Has patient had a PCN reaction causing immediate rash, facial/tongue/throat swelling, SOB or lightheadedness with hypotension: Unknown Has patient had a PCN reaction causing severe rash involving mucus membranes or skin necrosis: Unknown Has patient had a PCN reaction that required hospitalization: No Has patient had a PCN reaction occurring within the last 10 years: No If all of the above answers are "NO", then may proceed with Cephalosporin use.     Patient Measurements: Height: 5\' 3"  (160 cm) Weight: 134 lb (60.8 kg) IBW/kg (Calculated) : 52.4   Vital Signs: Temp: 97.8 F (36.6 C) (04/27 1300) Temp Source: Axillary (04/27 1300) BP: 132/63 (04/27 1300) Pulse Rate: 84 (04/27 1300) Intake/Output from previous day: No intake/output data recorded. Intake/Output from this shift: Total I/O In: 1200 [I.V.:1200] Out: 520 [Urine:390; Drains:30; Blood:100]  Labs: Recent Labs (last 2 labs)   No results for input(s): WBC, HGB, PLT, LABCREA, CREATININE in the last 72 hours.   Estimated Creatinine Clearance: 68 mL/min (by C-G formula based on SCr of 0.71 mg/dL). Recent Labs (last 2 labs)   No results for input(s): VANCOTROUGH, VANCOPEAK, VANCORANDOM, GENTTROUGH, GENTPEAK, GENTRANDOM, TOBRATROUGH, TOBRAPEAK, TOBRARND, AMIKACINPEAK, AMIKACINTROU, AMIKACIN in the last 72 hours.     Microbiology: No results found for this or any previous visit (from the past 720 hour(s)).  Medical History:     Past Medical History:  Diagnosis Date  . Colon polyp   . Diverticulosis   . Fibroid uterus   . HH (hiatus hernia)     Medications:            Anti-infectives    Start     Dose/Rate Route Frequency Ordered Stop   12/20/16 1400  clindamycin (CLEOCIN) IVPB 900 mg     900 mg 100 mL/hr over 30  Minutes Intravenous Every 8 hours 12/20/16 1116     12/20/16 1300  gentamicin (GARAMYCIN) 300 mg in dextrose 5 % 100 mL IVPB     5 mg/kg  60.8 kg 107.5 mL/hr over 60 Minutes Intravenous Every 24 hours 12/20/16 1116       Assessment: 51 yo female admitted with pelvic pain, underwent Lap BSO for complex ovarian cyst w/ abscess  Goal of Therapy:  Gentamicin trough level <2 mcg/ml  Plan:  Treat with Gentamicin 5 mg/kg q24hr in combination with Clindamycin .   Will follow up SCr and check random Gentamicin Level 10 hrs after start.  4/28 @ 0151 Gent trough: 0.7 appropriate. Level is < 1 mcg/mL after 12 hours which is appropriate, will continue current regimen and recheck gent trough 4/29 @ 0100. Scr 0.71 - 0.65 stable  Thank you for this consult.  Tobie Lords, PharmD, BCPS Clinical Pharmacist 12/21/2016

## 2016-12-23 NOTE — Op Note (Signed)
NAME:  [name], Anna                     ACCOUNT NO.:  MEDICAL RECORD NO.:  [id number]  LOCATION:                                 FACILITY:  PHYSICIAN:  Laverta Baltimore, MD     DATE OF BIRTH:  DATE OF PROCEDURE: DATE OF DISCHARGE:                              OPERATIVE REPORT   PREOPERATIVE DIAGNOSIS:  Complex left ovarian cyst.  POSTOPERATIVE DIAGNOSIS: 1. Chronic left tubo-ovarian abscess. 2. Extensive abdominopelvic adhesions. 3. Peritoneal nodules.  PROCEDURE PERFORMED: 1. Extensive pelvic abdominal adhesiolysis incorporating greater than     75% of the operating case. 2. Bilateral salpingo-oophorectomy. 3. Pelvic washings. 4. Peritoneal biopsy. 5. Cystoscopy. 6. Sigmoidoscopy.  SURGEON:  Laverta Baltimore, MD  ANESTHESIA:  General endotracheal anesthesia.  SURGEON:  Laverta Baltimore, MD.  FIRST ASSISTANT:  Honor Loh Ward, MD.  CONSULTING PHYSICIAN:  Dr. Olean Ree, General Surgery.  INDICATIONS:  A 51 year old gravida 0 presented to me approximately 4 months ago with bloating and pelvic pain.  The patient underwent a pelvic ultrasound that showed a complex left ovarian cyst, mainly solid. The patient denies any prior fever or unintentional weight loss.  The patient does have a history of diverticulosis.  DESCRIPTION OF PROCEDURE:  After adequate general endotracheal anesthesia, the patient was placed in dorsal supine position and legs in the [place] stirrups.  Abdomen, perineum, and vagina were prepped and draped in normal sterile fashion.  A time-out was performed.  A 15 mm infraumbilical incision was made after injecting with 0.5% Marcaine. The laparoscope was advanced into the abdominal cavity under direct visualization with the Optiview cannula.  The abdomen was insufflated with carbon dioxide.  A second port placement was placed 3 cm medial to the left anterior iliac spine and under direct visualization, the trocar was placed.  A third port was  placed 3 cm medial to the right anterior iliac spine and a 5 mm trocar was placed under direct visualization. The patient was placed in Trendelenburg.  Initial impression showed some serosanguineous free fluid, flat peritoneal nodules approximately 3 to 5 mm in size in whitish appearance throughout the lower abdomen.  The patient's left adnexa revealed a dilated adnexal mass that was firm and densely adherent to the descending colon deep into the pelvis.  There was a 5 cm calcified pedunculated fibroid to the anterior uterus and the right fallopian tube and ovary appeared normal.  Given the dense adherence of the descending pelvic colon to the left adnexal mass, General Surgery was consulted and Dr. Olean Ree presented approximately 10 minutes later.  During that time, some gentle dissection of the colon that was more proximally heaves to the left sidewall.  This was taken down with Harmonic Scalpel with traction and countertraction.  The left adnexal mass was gently dissected off the left lateral sidewall as well.  Once Dr. Hampton Abbot scrubbed into the case for the next approximately 2 hours, meticulous dissection of the colon from the adnexal mass was performed.  It appeared that this adnexal mass was inflammatory in nature, therefore consistent with either a TOA or potential diverticular abscess that adhered from the colon to the adnexal mass.  The  left infundibulopelvic ligament was cauterized with the dissection and ultimately the mass, which was extremely nodular and firm, was disarticulated from the left cornua.  The mass measured approximately 5 x 4 cm.  There was some serosal oozing from the dissection, which ultimately abated.  The right infundibulopelvic ligament was cauterized and the right fallopian tube and ovary were removed with Harmonic Scalpel without difficulty.  Again, they were transected from the right uterine cornua.  The calcified fibroid was removed with the  Harmonic Scalpel as well.  Oozing portion of the uterine serosa ultimately was controlled with Bovie cautery.  Given the amount of dissection around the intestine, Dr. Hampton Abbot opted to perform a sigmoidoscopy with insufflation of air while the abdominal cavity was filled with water to look for gas bubbles coming from the colon, which would reflect bowel injury, there was none.  The patient's abdomen was then irrigated and suctioned.  There was one of the representative peritoneal nodules that was removed with Harmonic Scalpel and sent to pathology for permanent section.  The patient's abdomen was copiously irrigated.  Good hemostasis was noted.  Of note, Dr. Hampton Abbot did place a fourth port site in the right upper quadrant 5 mm port under direct visualization to aid for traction and countertraction during the dissection.  The upper abdomen showed omental adhesions, especially to the left upper quadrant.  These were left intact.  Again, given the amount of dissection to the left sidewall and the left pelvic brim, surgeon felt it was prudent to do a cystoscopy, which was performed with normal saline distending medium and a 0.5 mL of fluorescein that was injected intravenously.  The ureteral orifices were identified bilaterally and normal pluming of urine with fluorescein was noted from both ostia.  The previously placed Foley was then replaced again to aid in drainage of the bladder.  Gloves were changed.  Attention was then redirected to the abdomen again.  All surgical sites appeared hemostatic.  An endobag was placed into the patient's abdomen and the left adnexum.  The left adnexal structures were removed through the endobag and a second bag was placed and the uterine fibroid was removed through this left lower port site.  The patient's abdomen was deflated then and the infraumbilical fascia was closed with 2-0 Vicryl suture. The left lower port site was closed with a running 2-0 Vicryl  fascial suture and all skin incisions were closed with 4-0 Vicryl suture.  Of note, a #7 Blake flat drain was placed prior to removal of the right lower port and the drain was brought through this port site and attached to a bulb.  There were no complications.  ESTIMATED BLOOD LOSS:  100 mL.  INTRAOPERATIVE FLUIDS:  1200 mL.  The patient tolerated the procedure well and was taken to the recovery room in good condition.          ______________________________ Laverta Baltimore, MD    TS/MEDQ  D:  12/20/2016  T:  12/21/2016  Job:  [id number]

## 2016-12-25 ENCOUNTER — Other Ambulatory Visit: Payer: Self-pay | Admitting: Pathology

## 2016-12-25 LAB — SURGICAL PATHOLOGY

## 2016-12-25 LAB — CYTOLOGY - NON PAP

## 2016-12-30 ENCOUNTER — Encounter: Payer: Self-pay | Admitting: Radiology

## 2016-12-30 ENCOUNTER — Inpatient Hospital Stay
Admission: EM | Admit: 2016-12-30 | Discharge: 2017-01-10 | DRG: 853 | Disposition: A | Discharge status: Home / Self Care | Payer: Managed Care, Other (non HMO) | Attending: Surgery | Admitting: Surgery

## 2016-12-30 ENCOUNTER — Emergency Department: Payer: Managed Care, Other (non HMO)

## 2016-12-30 DIAGNOSIS — E876 Hypokalemia: Secondary | ICD-10-CM | POA: Diagnosis not present

## 2016-12-30 DIAGNOSIS — Z79899 Other long term (current) drug therapy: Secondary | ICD-10-CM

## 2016-12-30 DIAGNOSIS — C801 Malignant (primary) neoplasm, unspecified: Secondary | ICD-10-CM | POA: Diagnosis not present

## 2016-12-30 DIAGNOSIS — C7960 Secondary malignant neoplasm of unspecified ovary: Secondary | ICD-10-CM | POA: Diagnosis present

## 2016-12-30 DIAGNOSIS — D696 Thrombocytopenia, unspecified: Secondary | ICD-10-CM | POA: Diagnosis not present

## 2016-12-30 DIAGNOSIS — K631 Perforation of intestine (nontraumatic): Secondary | ICD-10-CM | POA: Diagnosis present

## 2016-12-30 DIAGNOSIS — K5780 Diverticulitis of intestine, part unspecified, with perforation and abscess without bleeding: Secondary | ICD-10-CM | POA: Diagnosis present

## 2016-12-30 DIAGNOSIS — K651 Peritoneal abscess: Secondary | ICD-10-CM | POA: Diagnosis present

## 2016-12-30 DIAGNOSIS — E871 Hypo-osmolality and hyponatremia: Secondary | ICD-10-CM | POA: Diagnosis present

## 2016-12-30 DIAGNOSIS — R918 Other nonspecific abnormal finding of lung field: Secondary | ICD-10-CM | POA: Diagnosis not present

## 2016-12-30 DIAGNOSIS — K449 Diaphragmatic hernia without obstruction or gangrene: Secondary | ICD-10-CM | POA: Diagnosis present

## 2016-12-30 DIAGNOSIS — R0600 Dyspnea, unspecified: Secondary | ICD-10-CM

## 2016-12-30 DIAGNOSIS — T8149XA Infection following a procedure, other surgical site, initial encounter: Secondary | ICD-10-CM

## 2016-12-30 DIAGNOSIS — Z9049 Acquired absence of other specified parts of digestive tract: Secondary | ICD-10-CM | POA: Diagnosis not present

## 2016-12-30 DIAGNOSIS — K630 Abscess of intestine: Secondary | ICD-10-CM | POA: Diagnosis present

## 2016-12-30 DIAGNOSIS — C187 Malignant neoplasm of sigmoid colon: Secondary | ICD-10-CM | POA: Diagnosis present

## 2016-12-30 DIAGNOSIS — R6521 Severe sepsis with septic shock: Secondary | ICD-10-CM | POA: Diagnosis present

## 2016-12-30 DIAGNOSIS — C7802 Secondary malignant neoplasm of left lung: Secondary | ICD-10-CM | POA: Diagnosis not present

## 2016-12-30 DIAGNOSIS — K567 Ileus, unspecified: Secondary | ICD-10-CM | POA: Diagnosis not present

## 2016-12-30 DIAGNOSIS — R109 Unspecified abdominal pain: Secondary | ICD-10-CM

## 2016-12-30 DIAGNOSIS — K5790 Diverticulosis of intestine, part unspecified, without perforation or abscess without bleeding: Secondary | ICD-10-CM | POA: Diagnosis not present

## 2016-12-30 DIAGNOSIS — N739 Female pelvic inflammatory disease, unspecified: Secondary | ICD-10-CM

## 2016-12-30 DIAGNOSIS — E860 Dehydration: Secondary | ICD-10-CM | POA: Diagnosis present

## 2016-12-30 DIAGNOSIS — Z90722 Acquired absence of ovaries, bilateral: Secondary | ICD-10-CM | POA: Diagnosis not present

## 2016-12-30 DIAGNOSIS — Z880 Allergy status to penicillin: Secondary | ICD-10-CM

## 2016-12-30 DIAGNOSIS — C562 Malignant neoplasm of left ovary: Secondary | ICD-10-CM | POA: Diagnosis not present

## 2016-12-30 DIAGNOSIS — A419 Sepsis, unspecified organism: Principal | ICD-10-CM | POA: Diagnosis present

## 2016-12-30 DIAGNOSIS — Z90710 Acquired absence of both cervix and uterus: Secondary | ICD-10-CM | POA: Diagnosis not present

## 2016-12-30 DIAGNOSIS — R1900 Intra-abdominal and pelvic swelling, mass and lump, unspecified site: Secondary | ICD-10-CM | POA: Diagnosis not present

## 2016-12-30 DIAGNOSIS — D473 Essential (hemorrhagic) thrombocythemia: Secondary | ICD-10-CM | POA: Diagnosis not present

## 2016-12-30 DIAGNOSIS — Z86010 Personal history of colonic polyps: Secondary | ICD-10-CM | POA: Diagnosis not present

## 2016-12-30 DIAGNOSIS — T8143XA Infection following a procedure, organ and space surgical site, initial encounter: Secondary | ICD-10-CM

## 2016-12-30 DIAGNOSIS — C7801 Secondary malignant neoplasm of right lung: Secondary | ICD-10-CM | POA: Diagnosis not present

## 2016-12-30 DIAGNOSIS — C569 Malignant neoplasm of unspecified ovary: Secondary | ICD-10-CM | POA: Diagnosis not present

## 2016-12-30 DIAGNOSIS — Z931 Gastrostomy status: Secondary | ICD-10-CM | POA: Diagnosis not present

## 2016-12-30 DIAGNOSIS — Z933 Colostomy status: Secondary | ICD-10-CM | POA: Diagnosis not present

## 2016-12-30 DIAGNOSIS — D649 Anemia, unspecified: Secondary | ICD-10-CM | POA: Diagnosis not present

## 2016-12-30 DIAGNOSIS — C775 Secondary and unspecified malignant neoplasm of intrapelvic lymph nodes: Secondary | ICD-10-CM | POA: Diagnosis not present

## 2016-12-30 DIAGNOSIS — C787 Secondary malignant neoplasm of liver and intrahepatic bile duct: Secondary | ICD-10-CM | POA: Diagnosis not present

## 2016-12-30 DIAGNOSIS — C7962 Secondary malignant neoplasm of left ovary: Secondary | ICD-10-CM | POA: Diagnosis not present

## 2016-12-30 DIAGNOSIS — D631 Anemia in chronic kidney disease: Secondary | ICD-10-CM | POA: Diagnosis not present

## 2016-12-30 HISTORY — DX: Malignant (primary) neoplasm, unspecified: C80.1

## 2016-12-30 LAB — BASIC METABOLIC PANEL
ANION GAP: 15 (ref 5–15)
BUN: 21 mg/dL — ABNORMAL HIGH (ref 6–20)
CALCIUM: 8.4 mg/dL — AB (ref 8.9–10.3)
CO2: 32 mmol/L (ref 22–32)
Chloride: 81 mmol/L — ABNORMAL LOW (ref 101–111)
Creatinine, Ser: 0.72 mg/dL (ref 0.44–1.00)
GLUCOSE: 110 mg/dL — AB (ref 65–99)
POTASSIUM: 3.4 mmol/L — AB (ref 3.5–5.1)
Sodium: 128 mmol/L — ABNORMAL LOW (ref 135–145)

## 2016-12-30 LAB — URINALYSIS, COMPLETE (UACMP) WITH MICROSCOPIC
BACTERIA UA: NONE SEEN
BILIRUBIN URINE: NEGATIVE
GLUCOSE, UA: NEGATIVE mg/dL
Hgb urine dipstick: NEGATIVE
KETONES UR: 20 mg/dL — AB
LEUKOCYTES UA: NEGATIVE
NITRITE: NEGATIVE
PROTEIN: 30 mg/dL — AB
Specific Gravity, Urine: 1.02 (ref 1.005–1.030)
pH: 5 (ref 5.0–8.0)

## 2016-12-30 LAB — CBC
HEMATOCRIT: 34.7 % — AB (ref 35.0–47.0)
HEMOGLOBIN: 11.9 g/dL — AB (ref 12.0–16.0)
MCH: 30.6 pg (ref 26.0–34.0)
MCHC: 34.3 g/dL (ref 32.0–36.0)
MCV: 89.2 fL (ref 80.0–100.0)
Platelets: 635 10*3/uL — ABNORMAL HIGH (ref 150–440)
RBC: 3.89 MIL/uL (ref 3.80–5.20)
RDW: 13.6 % (ref 11.5–14.5)
WBC: 41 10*3/uL — ABNORMAL HIGH (ref 3.6–11.0)

## 2016-12-30 MED ORDER — MEPERIDINE HCL 50 MG/ML IJ SOLN: 50.0000 mg | INTRAMUSCULAR | Status: DC | PRN

## 2016-12-30 MED ORDER — LEVOFLOXACIN IN D5W 750 MG/150ML IV SOLN
750.0000 mg | Freq: Once | INTRAVENOUS | Status: AC
  Administered 2016-12-30: 750 mg via INTRAVENOUS
  Filled 2016-12-30 (×2): qty 150

## 2016-12-30 MED ORDER — LACTATED RINGERS IV SOLN: INTRAVENOUS | Status: DC

## 2016-12-30 MED ORDER — LEVOFLOXACIN IN D5W 750 MG/150ML IV SOLN
750.0000 mg | INTRAVENOUS | Status: DC
  Administered 2016-12-31 – 2017-01-01 (×2): 750 mg via INTRAVENOUS
  Filled 2016-12-30 (×3): qty 150

## 2016-12-30 MED ORDER — ONDANSETRON HCL 4 MG PO TABS
4.0000 mg | ORAL_TABLET | Freq: Four times a day (QID) | ORAL | Status: DC | PRN
  Filled 2016-12-30: qty 1

## 2016-12-30 MED ORDER — PRENATAL MULTIVITAMIN CH
1.0000 | ORAL_TABLET | Freq: Every day | ORAL | Status: DC
  Administered 2016-12-31: 1 via ORAL
  Filled 2016-12-30 (×2): qty 1

## 2016-12-30 MED ORDER — IOPAMIDOL (ISOVUE-300) INJECTION 61%
15.0000 mL | INTRAVENOUS | Status: AC
  Administered 2016-12-30 (×2): 15 mL via ORAL
  Filled 2016-12-30 (×2): qty 15

## 2016-12-30 MED ORDER — SODIUM CHLORIDE 0.9 % IV BOLUS (SEPSIS)
1000.0000 mL | Freq: Once | INTRAVENOUS | Status: AC
  Administered 2016-12-30: 1000 mL via INTRAVENOUS

## 2016-12-30 MED ORDER — ONDANSETRON HCL 4 MG/2ML IJ SOLN: 4.0000 mg | Freq: Four times a day (QID) | INTRAMUSCULAR | Status: DC | PRN

## 2016-12-30 MED ORDER — ONDANSETRON HCL 4 MG/2ML IJ SOLN
INTRAMUSCULAR | Status: AC
  Administered 2016-12-30: 4 mg via INTRAVENOUS
  Filled 2016-12-30: qty 2

## 2016-12-30 MED ORDER — ONDANSETRON HCL 4 MG/2ML IJ SOLN
4.0000 mg | Freq: Once | INTRAMUSCULAR | Status: AC
  Administered 2016-12-30: 4 mg via INTRAVENOUS

## 2016-12-30 MED ORDER — RANITIDINE NICU IV SYRINGE 25 MG/ML: 50.0000 mg | INJECTION | Freq: Two times a day (BID) | INTRAMUSCULAR | Status: DC

## 2016-12-30 MED ORDER — FAMOTIDINE IN NACL 20-0.9 MG/50ML-% IV SOLN
20.0000 mg | Freq: Two times a day (BID) | INTRAVENOUS | Status: DC
  Administered 2016-12-31 – 2017-01-03 (×8): 20 mg via INTRAVENOUS
  Filled 2016-12-30 (×10): qty 50

## 2016-12-30 MED ORDER — METRONIDAZOLE IN NACL 5-0.79 MG/ML-% IV SOLN
500.0000 mg | Freq: Three times a day (TID) | INTRAVENOUS | Status: DC
  Administered 2016-12-31 – 2017-01-01 (×5): 500 mg via INTRAVENOUS
  Filled 2016-12-30 (×9): qty 100

## 2016-12-30 MED ORDER — ENOXAPARIN SODIUM 40 MG/0.4ML ~~LOC~~ SOLN: 40.0000 mg | SUBCUTANEOUS | Status: DC

## 2016-12-30 MED ORDER — METRONIDAZOLE IN NACL 5-0.79 MG/ML-% IV SOLN
500.0000 mg | Freq: Once | INTRAVENOUS | Status: AC
  Administered 2016-12-30: 500 mg via INTRAVENOUS
  Filled 2016-12-30 (×2): qty 100

## 2016-12-30 MED ORDER — IOPAMIDOL (ISOVUE-370) INJECTION 76%
75.0000 mL | Freq: Once | INTRAVENOUS | Status: AC | PRN
  Administered 2016-12-30: 75 mL via INTRAVENOUS
  Filled 2016-12-30: qty 75

## 2016-12-30 NOTE — Progress Notes (Signed)
Pharmacy Antibiotic Note  Anna [name] is a 51 y.o. female admitted on 12/30/2016 with intra-abdominal infection.  Pharmacy has been consulted for levaquin/metronidazole dosing.  Plan: Patient received levaquin 750 mg IV and metronidazole 500 mg IV x 1  Will continue levaquin 750 mg IV daily and metronidazole 500 mg IV q8h. QTc 452 5/7  Height: 5\' 3"  (160 cm) Weight: 134 lb (60.8 kg) IBW/kg (Calculated) : 52.4  Temp (24hrs), Avg:98.5 F (36.9 C), Min:98.5 F (36.9 C), Max:98.5 F (36.9 C)   Recent Labs Lab 12/30/16 1803  WBC 41.0*  CREATININE 0.72    Estimated Creatinine Clearance: 68 mL/min (by C-G formula based on SCr of 0.72 mg/dL).    Allergies  Allergen Reactions  . Penicillins Rash    As a child. Has patient had a PCN reaction causing immediate rash, facial/tongue/throat swelling, SOB or lightheadedness with hypotension: Unknown Has patient had a PCN reaction causing severe rash involving mucus membranes or skin necrosis: Unknown Has patient had a PCN reaction that required hospitalization: No Has patient had a PCN reaction occurring within the last 10 years: No If all of the above answers are "NO", then may proceed with Cephalosporin use.      Thank you for allowing pharmacy to be a part of this patient's care.  Tobie Lords, PharmD, BCPS Clinical Pharmacist 12/30/2016

## 2016-12-30 NOTE — ED Triage Notes (Signed)
Pt via pov from Dr Schermerhorn's office with sob, weakness, fatigue. She states she had surgery to remove ovaries, which turned into more serious surgery which has ended with a cancer diagnosis. Pt states she has not had any energy, has had diarrhea, has not slept, and sob started yesterday. She states she "just can't breathe right." She is winded by going across the room. NAD noted.

## 2016-12-30 NOTE — ED Provider Notes (Signed)
[facility] Emergency Department Provider Note  ____________________________________________  Time seen: Approximately 9:38 PM  I have reviewed the triage vital signs and the nursing notes.   HISTORY  Chief Complaint Shortness of Breath and Weakness    HPI Anna [name] is a 51 y.o. female sent to the ED from gynecology clinic due to shortness of breath weakness and fatigue.She recently had abdominal surgery 2 weeks ago for removal of a left ovarian mass which was then determined to be a left TOA complicated by adhesion to the colon. She had bilateral salpingo-oophorectomy. 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 She reports worsening generalized abdominal pain as well as shortness of breath. Denies any chest pain or pleuritic pain. No fever or chills. Reports that she is eating and drinking fluids, but unable to eat solids and mostly just eating Jell-O.   Past Medical History:  Diagnosis Date  . Cancer ([place])   . Cancer with unknown primary site [facility])   . Colon polyp   . Diverticulosis   . Fibroid uterus   . HH (hiatus hernia)      Patient Active Problem List   Diagnosis Date Noted  . Large bowel perforation ([place]) 12/31/2016  . Perforation of sigmoid colon ([place])   . Abscess of abdominal cavity ([place])   . Dehydration, moderate 12/30/2016  . Metastatic adenocarcinoma ([place]) 12/20/2016     Past Surgical History:  Procedure Laterality Date  . COLONOSCOPY    . CYSTOSCOPY  12/20/2016   Procedure: CYSTOSCOPY;  Surgeon: Boykin Nearing, MD;  Location: [facility] ORS;  Service: Gynecology;;  . ESOPHAGOGASTRODUODENOSCOPY    . LAPAROSCOPIC BILATERAL SALPINGECTOMY Bilateral 12/20/2016   Procedure: LAPAROSCOPIC BILATERAL SALPINGOOPHERECTOMY,  Excision of anterior 5 cm calcified uterine fibroid;  Surgeon: Boykin Nearing, MD;  Location: [facility] ORS;  Service: Gynecology;  Laterality: Bilateral;  . LAPAROSCOPIC LYSIS OF ADHESIONS  12/20/2016   Procedure:  LAPAROSCOPIC LYSIS OF ADHESIONS WITH DISSECTION OF LEFT TUBE AND OVARY.;  Surgeon: Olean Ree, MD;  Location: [facility] ORS;  Service: General;;  . LAPAROTOMY N/A 12/31/2016   Procedure: EXPLORATORY LAPAROTOMY, sigmoid colectomy, removal of omentum, colostomy;  Surgeon: Jules Husbands, MD;  Location: [facility] ORS;  Service: General;  Laterality: N/A;  . SIGMOIDOSCOPY  12/20/2016   Procedure: RIGID SIGMOIDOSCOPY;  Surgeon: Olean Ree, MD;  Location: [facility] ORS;  Service: General;;  . TONSILLECTOMY       Prior to Admission medications   Medication Sig Start Date End Date Taking? Authorizing Provider  acetaminophen (TYLENOL) 500 MG tablet Take 500 mg by mouth as needed for mild pain.   Yes [provider]  estradiol (VIVELLE-DOT) 0.025 MG/24HR Place 1 patch onto the skin 2 (two) times a week.   Yes [provider]  HYDROcodone-acetaminophen (NORCO/VICODIN) 5-325 MG tablet Take 1 tablet by mouth every 6 (six) hours as needed for moderate pain.   Yes [provider]  naproxen (NAPROSYN) 500 MG tablet Take 500 mg by mouth 2 (two) times daily with a meal.   Yes [provider]     Allergies Penicillins   Family History  Problem Relation Age of Onset  . Colon cancer Mother 75  . Diabetes Mother   . Asthma Father   . Breast cancer Neg Hx     Social History Social History  Substance Use Topics  . Smoking status: Never Smoker  . Smokeless tobacco: Never Used  . Alcohol use No    Review of Systems  Constitutional:   No fever  or chills.  ENT:   No sore throat. No rhinorrhea. Lymphatic: No swollen glands, No extremity swelling Endocrine: No hot/cold flashes. No significant weight change. No neck swelling. Cardiovascular:   No chest pain or syncope. Respiratory:   Positive shortness of breath without cough. Gastrointestinal:   Positive generalized abdominal pain with diarrhea. No vomiting..  Genitourinary:   Negative for dysuria or difficulty  urinating. Musculoskeletal:   Negative for focal pain or swelling Neurological:   Negative for headaches or weakness. All other systems reviewed and are negative except as documented above in ROS and HPI.  ____________________________________________   PHYSICAL EXAM:  VITAL SIGNS: ED Triage Vitals [12/30/16 1751]  Enc Vitals Group     BP 110/61     Pulse Rate (!) 121     Resp 20     Temp 98.5 F (36.9 C)     Temp Source Oral     SpO2 98 %     Weight 134 lb (60.8 kg)     Height 5\' 3"  (1.6 m)     Head Circumference      Peak Flow      Pain Score 4     Pain Loc      Pain Edu?      Excl. in [place]?     Vital signs reviewed, nursing assessments reviewed.   Constitutional:   Alert and oriented. Well appearing and in no distress. Eyes:   No scleral icterus. No conjunctival pallor. PERRL. EOMI.  No nystagmus. ENT   Head:   Normocephalic and atraumatic.   Nose:   No congestion/rhinnorhea. No septal hematoma   Mouth/Throat:   Dry mucous membranes, no pharyngeal erythema. No peritonsillar mass.    Neck:   No stridor. No SubQ emphysema. No meningismus. Hematological/Lymphatic/Immunilogical:   No cervical lymphadenopathy. Cardiovascular:   Tachycardia heart rate 120. Symmetric bilateral radial and DP pulses.  No murmurs.  Respiratory:   Normal respiratory effort without tachypnea nor retractions. Breath sounds are clear and equal bilaterally. No wheezes/rales/rhonchi. Gastrointestinal:   Soft with generalized tenderness. Non distended. There is no CVA tenderness.  No rebound, rigidity, or guarding. Genitourinary:   deferred Musculoskeletal:   Normal range of motion in all extremities. No joint effusions.  No lower extremity tenderness.  No edema. Neurologic:   Normal speech and language.  CN 2-10 normal. Motor grossly intact. No gross focal neurologic deficits are appreciated.  Skin:    Skin is warm, dry and intact. No rash noted.  No petechiae, purpura, or  bullae.  ____________________________________________    LABS (pertinent positives/negatives) (all labs ordered are listed, but only abnormal results are displayed) Labs Reviewed  BASIC METABOLIC PANEL - Abnormal; Notable for the following:       Result Value   Sodium 128 (*)    Potassium 3.4 (*)    Chloride 81 (*)    Glucose, Bld 110 (*)    BUN 21 (*)    Calcium 8.4 (*)    All other components within normal limits  CBC - Abnormal; Notable for the following:    WBC 41.0 (*)    Hemoglobin 11.9 (*)    HCT 34.7 (*)    Platelets 635 (*)    All other components within normal limits  URINALYSIS, COMPLETE (UACMP) WITH MICROSCOPIC - Abnormal; Notable for the following:    Color, Urine AMBER (*)    APPearance HAZY (*)    Ketones, ur 20 (*)    Protein, ur 30 (*)  Squamous Epithelial / LPF 0-5 (*)    All other components within normal limits  COMPREHENSIVE METABOLIC PANEL - Abnormal; Notable for the following:    Sodium 130 (*)    Potassium 3.2 (*)    Chloride 93 (*)    Glucose, Bld 102 (*)    Calcium 7.3 (*)    Total Protein 4.5 (*)    Albumin 1.7 (*)    AST 43 (*)    Total Bilirubin 3.0 (*)    All other components within normal limits  CBC WITH DIFFERENTIAL/PLATELET - Abnormal; Notable for the following:    WBC 33.2 (*)    RBC 3.49 (*)    Hemoglobin 10.5 (*)    HCT 31.1 (*)    Platelets 580 (*)    Neutro Abs 31.8 (*)    Lymphs Abs 0.7 (*)    All other components within normal limits  BASIC METABOLIC PANEL - Abnormal; Notable for the following:    Sodium 133 (*)    Chloride 96 (*)    Calcium 7.1 (*)    All other components within normal limits  CBC - Abnormal; Notable for the following:    WBC 23.5 (*)    RBC 2.52 (*)    Hemoglobin 7.7 (*)    HCT 22.9 (*)    All other components within normal limits  CBC - Abnormal; Notable for the following:    WBC 22.6 (*)    RBC 2.46 (*)    Hemoglobin 7.4 (*)    HCT 21.9 (*)    All other components within normal limits   BASIC METABOLIC PANEL - Abnormal; Notable for the following:    Sodium 134 (*)    Chloride 99 (*)    Glucose, Bld 118 (*)    Calcium 7.0 (*)    All other components within normal limits  CBC - Abnormal; Notable for the following:    WBC 17.9 (*)    RBC 2.52 (*)    Hemoglobin 7.8 (*)    HCT 22.7 (*)    Platelets 572 (*)    All other components within normal limits  BASIC METABOLIC PANEL - Abnormal; Notable for the following:    Potassium 3.0 (*)    Chloride 100 (*)    Glucose, Bld 126 (*)    Calcium 7.1 (*)    All other components within normal limits  CBC - Abnormal; Notable for the following:    WBC 17.7 (*)    RBC 2.77 (*)    Hemoglobin 8.4 (*)    HCT 25.0 (*)    Platelets 685 (*)    All other components within normal limits  BASIC METABOLIC PANEL - Abnormal; Notable for the following:    Potassium 3.2 (*)    Chloride 98 (*)    CO2 33 (*)    Glucose, Bld 104 (*)    Calcium 7.4 (*)    All other components within normal limits  CBC - Abnormal; Notable for the following:    WBC 21.0 (*)    RBC 2.53 (*)    Hemoglobin 7.8 (*)    HCT 23.0 (*)    Platelets 774 (*)    All other components within normal limits  BASIC METABOLIC PANEL - Abnormal; Notable for the following:    Potassium 3.4 (*)    Chloride 100 (*)    Glucose, Bld 106 (*)    Calcium 7.2 (*)    All other components within normal limits  CULTURE, BLOOD (  ROUTINE X 2)  CULTURE, BLOOD (ROUTINE X 2)  ANAEROBIC CULTURE  AEROBIC CULTURE (SUPERFICIAL SPECIMEN)  MRSA PCR SCREENING  AEROBIC/ANAEROBIC CULTURE (SURGICAL/DEEP WOUND)  HIV ANTIBODY (ROUTINE TESTING)  MAGNESIUM  PHOSPHORUS  GLUCOSE, CAPILLARY  PROCALCITONIN  LACTIC ACID, PLASMA  POTASSIUM  PROCALCITONIN  MAGNESIUM  CEA  CANCER ANTIGEN 27.29  CANCER ANTIGEN 19-9  PROCALCITONIN  CANCER ANTIGEN 15-3  MAGNESIUM  CBC  BASIC METABOLIC PANEL  CBG MONITORING, ED  SURGICAL PATHOLOGY    ____________________________________________   EKG  Interpreted by me Sinus tachycardia rate 128, normal axis and intervals. Normal QRS and ST segments. Unremarkable T waves.  ____________________________________________    RADIOLOGY  Ct Abdomen Pelvis W Contrast  Result Date: 01/05/2017 CLINICAL DATA:  History of adenocarcinoma. Recent perforation of the sigmoid colon with subsequent surgery. Leukocytosis. EXAM: CT ABDOMEN AND PELVIS WITH CONTRAST TECHNIQUE: Multidetector CT imaging of the abdomen and pelvis was performed using the standard protocol following bolus administration of intravenous contrast. CONTRAST:  181mL ISOVUE-300 IOPAMIDOL (ISOVUE-300) INJECTION 61%, <See Chart> ISOVUE-300 IOPAMIDOL (ISOVUE-300) INJECTION 61% COMPARISON:  Dec 30, 2016 FINDINGS: Lower chest: Pulmonary nodularity again identified, suspicious for metastatic disease given history. There is a small left pleural effusion. Bibasilar opacities are likely atelectasis. No other acute abnormalities in the lung bases. Hepatobiliary: A low-attenuation lesion in the left hepatic lobe measures 14 mm, unchanged. A cyst is seen along the inferior right hepatic lobe. No other definitive solid masses are seen within the liver. The portal vein is patent. The gallbladder is unremarkable. No other abnormalities in the liver. Pancreas: Unremarkable. No pancreatic ductal dilatation or surrounding inflammatory changes. Spleen: Normal in size without focal abnormality. Adrenals/Urinary Tract: The adrenal glands, kidneys, and ureters are normal. Air in the bladder is likely from recent catheterization. Stomach/Bowel: The distal esophagus and stomach are normal. Small bowel loops are mildly prominent but not grossly dilated. Contrast does not reach the terminal ileum but does traverse the small bowel into the region of the mid ileum. The patient is status post partial colonic resection. There is gas in the colon to the level of the left  lower quadrant ostomy. Vascular/Lymphatic: The abdominal aorta is normal in caliber. Shotty nodes in the retroperitoneum are again identified. The previously identified 12 mm node to the left of the aorta on image 35 is unchanged. No new adenopathy identified. Reproductive: Fibroids in the uterus. By report, the ovaries have been removed. Other: There is free fluid within the abdomen and pelvis which could be postoperative. There is an organized collection of fluid as seen on axial image 73. There is peripheral enhancement as well some air within this collection. The main body of this collection measures 10.4 by 5.5 by 4.7 cm. A smaller collection of fluid is seen in the bowel mesenteries on series 2, image 53 measuring 4.7 by 2.1 cm. This is not nearly as organized and could simply be postoperative. No other definitive organized collections are seen. There is free air scattered throughout the abdomen and pelvis consistent with the recent postoperative status. Musculoskeletal: No acute changes. IMPRESSION: 1. There is an organizing collection of fluid measuring 10 x 6 x 5 cm in the pelvis. There is peripheral enhancement around this collection as well some air in the collection. Given history, the findings are suspicious for developing abscess. A smaller collection in the bowel mesentary is less specific and could simply be postoperative. 2. Pulmonary nodule suspicious for metastatic disease. 3. Low-attenuation lesion in the left hepatic lobe is nonspecific.  However, given history, a metastatic lesion could have this appearance. 4. Probable mild ileus.  No definite obstruction. 5. Shotty nodes in the retroperitoneum could be reactive. However, given history, it would be difficult to completely exclude metastatic disease. These results will be called to the ordering clinician or representative by the Radiologist Assistant, and communication documented in the PACS or zVision Dashboard. Electronically Signed   By:  Dorise Bullion III M.D   On: 01/05/2017 12:26   Ct Image Guided Drainage By Percutaneous Catheter  Result Date: 01/05/2017 INDICATION: 51 year old female with a peripherally enhancing fluid collection and increasing leukocytosis status post sigmoid colon resection, left lower quadrant colostomy creation and Hartmann's pouch procedure for perforated sigmoid colon. Imaging findings are concerning for postoperative abscess. EXAM: CT GUIDED DRAINAGE OF right lower quadrant intraperitoneal abdominal ABSCESS MEDICATIONS: The patient is currently admitted to the hospital and receiving intravenous antibiotics. The antibiotics were administered within an appropriate time frame prior to the initiation of the procedure. ANESTHESIA/SEDATION: 2 mg IV Versed 100 mcg IV Fentanyl Moderate Sedation Time:  20 minutes The patient was continuously monitored during the procedure by the interventional radiology nurse under my direct supervision. COMPLICATIONS: None immediate. TECHNIQUE: Informed written consent was obtained from the patient after a thorough discussion of the procedural risks, benefits and alternatives. All questions were addressed. Maximal Sterile Barrier Technique was utilized including caps, mask, sterile gowns, sterile gloves, sterile drape, hand hygiene and skin antiseptic. A timeout was performed prior to the initiation of the procedure. PROCEDURE: The operative field was prepped with Chlorhexidine in a sterile fashion, and a sterile drape was applied covering the operative field. A sterile gown and sterile gloves were used for the procedure. Local anesthesia was provided with 1% Lidocaine. A small dermatotomy was made. Using intermittent CT guidance, an 18 gauge trocar needle was advanced through the right lower quadrant abdominal wall taking care to avoid the inferior epigastric vessels. The needle was advanced into the fluid collection. An Amplatz wire was then advanced the needle and coiled within the fluid  collection. The needle was removed. The skin tract was dilated to 47 [place] and a Cook 12 [place] all-purpose drainage catheter was advanced over the wire and formed in the fluid collection. Aspiration yields approximately 40 mL of turbid serosanguineous fluid containing internal debris. A sample was sent to the laboratory for culture. Post drain placement axial CT imaging demonstrates excellent position of the drainage catheter and near complete resolution of the fluid collection. The catheter was secured to the skin with 0 Prolene suture and a sterile bandage. The patient tolerated the procedure well. FINDINGS: Approximately 40 mL turbid serosanguineous fluid with internal debris. IMPRESSION: Technically successful placement of a 40 French percutaneous drain into the right lower quadrant postoperative fluid collection with aspiration as described above. Maintain tube to JP bulb suction. When drain output is minimal, recommend repeat CT scan of the pelvis with intravenous contrast prior to drain removal. Electronically Signed   By: Jacqulynn Cadet M.D.   On: 01/05/2017 16:00    ____________________________________________   PROCEDURES Procedures CRITICAL CARE Performed by: Joni Fears, Jonnatan Hanners   Total critical care time: 35 minutes  Critical care time was exclusive of separately billable procedures and treating other patients.  Critical care was necessary to treat or prevent imminent or life-threatening deterioration.  Critical care was time spent personally by me on the following activities: development of treatment plan with patient and/or surrogate as well as nursing, discussions with consultants, evaluation of patient's response  to treatment, examination of patient, obtaining history from patient or surrogate, ordering and performing treatments and interventions, ordering and review of laboratory studies, ordering and review of radiographic studies, pulse oximetry and re-evaluation of patient's  condition.  ____________________________________________   INITIAL IMPRESSION / ASSESSMENT AND PLAN / ED COURSE  Pertinent labs & imaging results that were available during my care of the patient were reviewed by me and considered in my medical decision making (see chart for details).  Patient presents for shortness of breath tachycardia generalized abdominal pain after recent surgery and new diagnosis of cancer. Differential includes bowel obstruction, ileus, pulmonary embolism, C. difficile colitis, dehydration, abdominal abscess, among others. Found to be hyponatremic with a severely elevated white blood cell count of 41,000 on labs.  Started with IV saline. CT chest abdomen pelvis to evaluate for PE or acute intra-abdominal process. Levaquin and Flagyl given penicillin allergy with possible intra-abdominal infection. Plan for hospitalization.         ____________________________________________   FINAL CLINICAL IMPRESSION(S) / ED DIAGNOSES  Final diagnoses:  Dyspnea  Hyponatremia  Perforation of sigmoid colon ([place])  Abscess of abdominal cavity [facility])      Current Discharge Medication List       Portions of this note were generated with dragon dictation software. Dictation errors may occur despite best attempts at proofreading.    Carrie Mew, MD 01/05/17 2326

## 2016-12-31 ENCOUNTER — Encounter
Admission: EM | Disposition: A | Discharge status: Home / Self Care | Payer: Self-pay | Source: Home / Self Care | Attending: Surgery

## 2016-12-31 ENCOUNTER — Inpatient Hospital Stay: Payer: Managed Care, Other (non HMO) | Admitting: Anesthesiology

## 2016-12-31 ENCOUNTER — Encounter: Payer: Self-pay | Admitting: Anesthesiology

## 2016-12-31 DIAGNOSIS — K631 Perforation of intestine (nontraumatic): Secondary | ICD-10-CM

## 2016-12-31 DIAGNOSIS — K651 Peritoneal abscess: Secondary | ICD-10-CM

## 2016-12-31 HISTORY — PX: LAPAROTOMY: SHX154

## 2016-12-31 HISTORY — DX: Perforation of intestine (nontraumatic): K63.1

## 2016-12-31 LAB — COMPREHENSIVE METABOLIC PANEL
ALT: 30 U/L (ref 14–54)
AST: 43 U/L — AB (ref 15–41)
Albumin: 1.7 g/dL — ABNORMAL LOW (ref 3.5–5.0)
Alkaline Phosphatase: 100 U/L (ref 38–126)
Anion gap: 10 (ref 5–15)
BUN: 16 mg/dL (ref 6–20)
CHLORIDE: 93 mmol/L — AB (ref 101–111)
CO2: 27 mmol/L (ref 22–32)
Calcium: 7.3 mg/dL — ABNORMAL LOW (ref 8.9–10.3)
Creatinine, Ser: 0.62 mg/dL (ref 0.44–1.00)
GFR calc Af Amer: >60 mL/min (ref >60)
Glucose, Bld: 102 mg/dL — ABNORMAL HIGH (ref 65–99)
POTASSIUM: 3.2 mmol/L — AB (ref 3.5–5.1)
Sodium: 130 mmol/L — ABNORMAL LOW (ref 135–145)
Total Bilirubin: 3 mg/dL — ABNORMAL HIGH (ref 0.3–1.2)
Total Protein: 4.5 g/dL — ABNORMAL LOW (ref 6.5–8.1)

## 2016-12-31 LAB — MAGNESIUM: MAGNESIUM: 1.7 mg/dL (ref 1.7–2.4)

## 2016-12-31 LAB — CBC WITH DIFFERENTIAL/PLATELET
Basophils Absolute: 0 10*3/uL (ref 0–0.1)
Basophils Relative: 0 %
EOS PCT: 0 %
Eosinophils Absolute: 0 10*3/uL (ref 0–0.7)
HEMATOCRIT: 31.1 % — AB (ref 35.0–47.0)
Hemoglobin: 10.5 g/dL — ABNORMAL LOW (ref 12.0–16.0)
LYMPHS ABS: 0.7 10*3/uL — AB (ref 1.0–3.6)
Lymphocytes Relative: 2 %
MCH: 30.2 pg (ref 26.0–34.0)
MCHC: 33.9 g/dL (ref 32.0–36.0)
MCV: 89 fL (ref 80.0–100.0)
MONOS PCT: 2 %
Monocytes Absolute: 0.7 10*3/uL (ref 0.2–0.9)
Neutro Abs: 31.8 10*3/uL — ABNORMAL HIGH (ref 1.4–6.5)
Neutrophils Relative %: 96 %
PLATELETS: 580 10*3/uL — AB (ref 150–440)
RBC: 3.49 MIL/uL — AB (ref 3.80–5.20)
RDW: 13.9 % (ref 11.5–14.5)
WBC: 33.2 10*3/uL — AB (ref 3.6–11.0)

## 2016-12-31 LAB — MRSA PCR SCREENING: MRSA BY PCR: NEGATIVE

## 2016-12-31 LAB — GLUCOSE, CAPILLARY: GLUCOSE-CAPILLARY: 95 mg/dL (ref 65–99)

## 2016-12-31 LAB — PHOSPHORUS: PHOSPHORUS: 2.9 mg/dL (ref 2.5–4.6)

## 2016-12-31 LAB — LACTIC ACID, PLASMA: Lactic Acid, Venous: 1 mmol/L (ref 0.5–1.9)

## 2016-12-31 LAB — POTASSIUM: Potassium: 3.9 mmol/L (ref 3.5–5.1)

## 2016-12-31 LAB — PROCALCITONIN: Procalcitonin: 1.27 ng/mL

## 2016-12-31 SURGERY — LAPAROTOMY, EXPLORATORY
Anesthesia: General | Site: Abdomen | Wound class: Contaminated

## 2016-12-31 MED ORDER — SODIUM CHLORIDE 0.9 % IV SOLN
INTRAVENOUS | Status: DC | PRN
  Administered 2016-12-31: 03:00:00

## 2016-12-31 MED ORDER — LACTATED RINGERS IV SOLN
INTRAVENOUS | Status: DC
  Administered 2016-12-31 – 2017-01-01 (×5): via INTRAVENOUS

## 2016-12-31 MED ORDER — POTASSIUM CHLORIDE CRYS ER 20 MEQ PO TBCR
40.0000 meq | EXTENDED_RELEASE_TABLET | Freq: Once | ORAL | Status: AC
  Administered 2016-12-31: 40 meq via ORAL
  Filled 2016-12-31: qty 2

## 2016-12-31 MED ORDER — SODIUM CHLORIDE 0.9 % IV BOLUS (SEPSIS)
1000.0000 mL | Freq: Once | INTRAVENOUS | Status: AC
  Administered 2016-12-31: 1000 mL via INTRAVENOUS

## 2016-12-31 MED ORDER — LACTATED RINGERS IV SOLN
INTRAVENOUS | Status: DC | PRN
  Administered 2016-12-31: 03:00:00 via INTRAVENOUS

## 2016-12-31 MED ORDER — ONDANSETRON 4 MG PO TBDP
4.0000 mg | ORAL_TABLET | Freq: Four times a day (QID) | ORAL | Status: DC | PRN
  Filled 2016-12-31: qty 1

## 2016-12-31 MED ORDER — CIPROFLOXACIN IN D5W 400 MG/200ML IV SOLN: 400.0000 mg | Freq: Two times a day (BID) | INTRAVENOUS | Status: DC

## 2016-12-31 MED ORDER — CIPROFLOXACIN IN D5W 400 MG/200ML IV SOLN
400.0000 mg | Freq: Two times a day (BID) | INTRAVENOUS | Status: DC
  Administered 2016-12-31 – 2017-01-10 (×21): 400 mg via INTRAVENOUS
  Filled 2016-12-31 (×22): qty 200

## 2016-12-31 MED ORDER — MORPHINE SULFATE (PF) 4 MG/ML IV SOLN
4.0000 mg | INTRAVENOUS | Status: DC | PRN
  Administered 2016-12-31: 4 mg via INTRAVENOUS
  Filled 2016-12-31: qty 1

## 2016-12-31 MED ORDER — DIPHENHYDRAMINE HCL 12.5 MG/5ML PO ELIX
12.5000 mg | ORAL_SOLUTION | Freq: Four times a day (QID) | ORAL | Status: DC | PRN
  Filled 2016-12-31: qty 5

## 2016-12-31 MED ORDER — PROPOFOL 10 MG/ML IV BOLUS
INTRAVENOUS | Status: DC | PRN
  Administered 2016-12-31: 120 mg via INTRAVENOUS

## 2016-12-31 MED ORDER — ROCURONIUM BROMIDE 100 MG/10ML IV SOLN
INTRAVENOUS | Status: DC | PRN
  Administered 2016-12-31 (×2): 20 mg via INTRAVENOUS
  Administered 2016-12-31: 10 mg via INTRAVENOUS

## 2016-12-31 MED ORDER — ALBUMIN HUMAN 25 % IV SOLN
25.0000 g | Freq: Once | INTRAVENOUS | Status: AC
  Administered 2016-12-31: 25 g via INTRAVENOUS
  Filled 2016-12-31 (×2): qty 100

## 2016-12-31 MED ORDER — SUGAMMADEX SODIUM 200 MG/2ML IV SOLN
INTRAVENOUS | Status: AC
  Filled 2016-12-31: qty 2

## 2016-12-31 MED ORDER — LIDOCAINE HCL (CARDIAC) 20 MG/ML IV SOLN
INTRAVENOUS | Status: DC | PRN
  Administered 2016-12-31: 100 mg via INTRAVENOUS

## 2016-12-31 MED ORDER — ROCURONIUM BROMIDE 100 MG/10ML IV SOLN
INTRAVENOUS | Status: AC
  Filled 2016-12-31: qty 1

## 2016-12-31 MED ORDER — ALBUMIN HUMAN 25 % IV SOLN
12.5000 g | Freq: Once | INTRAVENOUS | Status: AC
  Administered 2016-12-31: 12.5 g via INTRAVENOUS
  Filled 2016-12-31: qty 50

## 2016-12-31 MED ORDER — ENOXAPARIN SODIUM 40 MG/0.4ML ~~LOC~~ SOLN
40.0000 mg | SUBCUTANEOUS | Status: DC
  Administered 2017-01-01 – 2017-01-10 (×9): 40 mg via SUBCUTANEOUS
  Filled 2016-12-31 (×10): qty 0.4

## 2016-12-31 MED ORDER — FENTANYL CITRATE (PF) 100 MCG/2ML IJ SOLN
25.0000 ug | INTRAMUSCULAR | Status: AC | PRN
  Administered 2016-12-31 (×6): 25 ug via INTRAVENOUS

## 2016-12-31 MED ORDER — FENTANYL CITRATE (PF) 100 MCG/2ML IJ SOLN
50.0000 ug | Freq: Once | INTRAMUSCULAR | Status: AC
  Administered 2016-12-31 (×2): 25 ug via INTRAVENOUS

## 2016-12-31 MED ORDER — PANTOPRAZOLE SODIUM 40 MG IV SOLR: 40.0000 mg | Freq: Every day | INTRAVENOUS | Status: DC

## 2016-12-31 MED ORDER — FENTANYL CITRATE (PF) 100 MCG/2ML IJ SOLN
INTRAMUSCULAR | Status: AC
Start: 2016-12-31 — End: 2016-12-31
  Administered 2016-12-31: 25 ug via INTRAVENOUS
  Filled 2016-12-31: qty 2

## 2016-12-31 MED ORDER — SUCCINYLCHOLINE CHLORIDE 20 MG/ML IJ SOLN
INTRAMUSCULAR | Status: DC | PRN
  Administered 2016-12-31: 100 mg via INTRAVENOUS

## 2016-12-31 MED ORDER — FENTANYL CITRATE (PF) 100 MCG/2ML IJ SOLN
INTRAMUSCULAR | Status: AC
  Filled 2016-12-31: qty 2

## 2016-12-31 MED ORDER — ALBUMIN HUMAN 5 % IV SOLN
INTRAVENOUS | Status: DC | PRN
  Administered 2016-12-31: 01:00:00 via INTRAVENOUS

## 2016-12-31 MED ORDER — BUPIVACAINE-EPINEPHRINE (PF) 0.25% -1:200000 IJ SOLN
INTRAMUSCULAR | Status: DC | PRN
  Administered 2016-12-31: 30 mL via PERINEURAL

## 2016-12-31 MED ORDER — METRONIDAZOLE IN NACL 5-0.79 MG/ML-% IV SOLN
500.0000 mg | Freq: Three times a day (TID) | INTRAVENOUS | Status: DC
  Filled 2016-12-31 (×5): qty 100

## 2016-12-31 MED ORDER — DIPHENHYDRAMINE HCL 50 MG/ML IJ SOLN: 12.5000 mg | Freq: Four times a day (QID) | INTRAMUSCULAR | Status: DC | PRN

## 2016-12-31 MED ORDER — LACTATED RINGERS IV SOLN
INTRAVENOUS | Status: DC | PRN
  Administered 2016-12-31 (×3): via INTRAVENOUS

## 2016-12-31 MED ORDER — ACETAMINOPHEN 10 MG/ML IV SOLN
1000.0000 mg | Freq: Four times a day (QID) | INTRAVENOUS | Status: DC
  Filled 2016-12-31 (×2): qty 100

## 2016-12-31 MED ORDER — FENTANYL CITRATE (PF) 250 MCG/5ML IJ SOLN
INTRAMUSCULAR | Status: AC
  Filled 2016-12-31: qty 5

## 2016-12-31 MED ORDER — SUCCINYLCHOLINE CHLORIDE 20 MG/ML IJ SOLN
INTRAMUSCULAR | Status: AC
  Filled 2016-12-31: qty 1

## 2016-12-31 MED ORDER — SUGAMMADEX SODIUM 200 MG/2ML IV SOLN
INTRAVENOUS | Status: DC | PRN
  Administered 2016-12-31: 121.6 mg via INTRAVENOUS

## 2016-12-31 MED ORDER — FENTANYL CITRATE (PF) 100 MCG/2ML IJ SOLN
INTRAMUSCULAR | Status: DC | PRN
  Administered 2016-12-31 (×7): 50 ug via INTRAVENOUS

## 2016-12-31 MED ORDER — FENTANYL CITRATE (PF) 100 MCG/2ML IJ SOLN
INTRAMUSCULAR | Status: AC
  Administered 2016-12-31: 25 ug via INTRAVENOUS
  Filled 2016-12-31: qty 2

## 2016-12-31 MED ORDER — MAGNESIUM SULFATE 2 GM/50ML IV SOLN
2.0000 g | Freq: Once | INTRAVENOUS | Status: AC
  Administered 2016-12-31: 2 g via INTRAVENOUS
  Filled 2016-12-31: qty 50

## 2016-12-31 MED ORDER — ONDANSETRON HCL 4 MG/2ML IJ SOLN
4.0000 mg | Freq: Four times a day (QID) | INTRAMUSCULAR | Status: DC | PRN
  Filled 2016-12-31: qty 2

## 2016-12-31 MED ORDER — PROMETHAZINE HCL 25 MG/ML IJ SOLN: 6.2500 mg | INTRAMUSCULAR | Status: DC | PRN

## 2016-12-31 MED ORDER — ONDANSETRON HCL 4 MG/2ML IJ SOLN
INTRAMUSCULAR | Status: DC | PRN
  Administered 2016-12-31: 4 mg via INTRAVENOUS

## 2016-12-31 MED ORDER — KETOROLAC TROMETHAMINE 30 MG/ML IJ SOLN
30.0000 mg | Freq: Four times a day (QID) | INTRAMUSCULAR | Status: AC
  Administered 2016-12-31 – 2017-01-04 (×20): 30 mg via INTRAVENOUS
  Filled 2016-12-31 (×21): qty 1

## 2016-12-31 MED ORDER — ONDANSETRON HCL 4 MG/2ML IJ SOLN
INTRAMUSCULAR | Status: AC
  Filled 2016-12-31: qty 2

## 2016-12-31 SURGICAL SUPPLY — 61 items
ADHESIVE MASTISOL STRL (MISCELLANEOUS) ×3 IMPLANT
APPLIER CLIP 11 MED OPEN (CLIP)
APPLIER CLIP 13 LRG OPEN (CLIP)
BLADE CLIPPER SURG (BLADE) ×3 IMPLANT
BLADE SURG 15 STRL LF DISP TIS (BLADE) ×1 IMPLANT
BLADE SURG 15 STRL SS (BLADE) ×2
BNDG GAUZE 4.5X4.1 6PLY STRL (MISCELLANEOUS) ×3 IMPLANT
BULB RESERV EVAC DRAIN JP 100C (MISCELLANEOUS) ×6 IMPLANT
CANISTER SUCT 1200ML W/VALVE (MISCELLANEOUS) ×3 IMPLANT
CANISTER SUCT 3000ML PPV (MISCELLANEOUS) ×6 IMPLANT
CHLORAPREP W/TINT 26ML (MISCELLANEOUS) ×3 IMPLANT
CLIP APPLIE 11 MED OPEN (CLIP) IMPLANT
CLIP APPLIE 13 LRG OPEN (CLIP) IMPLANT
DRAIN CHANNEL JP 19F (MISCELLANEOUS) ×6 IMPLANT
DRAPE LAPAROTOMY 100X77 ABD (DRAPES) ×3 IMPLANT
DRAPE TABLE BACK 80X90 (DRAPES) IMPLANT
DRSG TEGADERM 2-3/8X2-3/4 SM (GAUZE/BANDAGES/DRESSINGS) IMPLANT
DRSG TELFA 3X8 NADH (GAUZE/BANDAGES/DRESSINGS) IMPLANT
ELECT BLADE 6.5 EXT (BLADE) ×3 IMPLANT
ELECT CAUTERY BLADE 6.4 (BLADE) ×3 IMPLANT
ELECT REM PT RETURN 9FT ADLT (ELECTROSURGICAL) ×3
ELECTRODE REM PT RTRN 9FT ADLT (ELECTROSURGICAL) ×1 IMPLANT
GAUZE SPONGE 4X4 12PLY STRL (GAUZE/BANDAGES/DRESSINGS) ×3 IMPLANT
GLOVE BIO SURGEON STRL SZ7 (GLOVE) ×27 IMPLANT
GOWN STRL REUS W/ TWL LRG LVL3 (GOWN DISPOSABLE) ×6 IMPLANT
GOWN STRL REUS W/TWL LRG LVL3 (GOWN DISPOSABLE) ×12
HANDLE SUCTION POOLE (INSTRUMENTS) IMPLANT
HANDLE YANKAUER SUCT BULB TIP (MISCELLANEOUS) IMPLANT
LIGASURE IMPACT 36 18CM CVD LR (INSTRUMENTS) ×3 IMPLANT
NEEDLE HYPO 22GX1.5 SAFETY (NEEDLE) IMPLANT
NEEDLE HYPO 25X1 1.5 SAFETY (NEEDLE) ×3 IMPLANT
NS IRRIG 1000ML POUR BTL (IV SOLUTION) ×12 IMPLANT
NS IRRIG 500ML POUR BTL (IV SOLUTION) ×6 IMPLANT
PACK BASIN MAJOR ARMC (MISCELLANEOUS) ×3 IMPLANT
PAD DRESSING TELFA 3X8 NADH (GAUZE/BANDAGES/DRESSINGS) IMPLANT
POUCH DRAIN 1 3/4 SMALL GREEN (OSTOMY) ×3 IMPLANT
RELOAD PROXIMATE 75MM BLUE (ENDOMECHANICALS) IMPLANT
RELOAD STAPLE 75 3.8 BLU REG (ENDOMECHANICALS) IMPLANT
SPONGE LAP 18X18 5 PK (GAUZE/BANDAGES/DRESSINGS) ×9 IMPLANT
SPONGE LAP 18X36 2PK (MISCELLANEOUS) IMPLANT
STAPLER CUT CVD 40MM BLUE (STAPLE) ×3 IMPLANT
STAPLER PROXIMATE 75MM BLUE (STAPLE) ×3 IMPLANT
STAPLER SKIN PROX 35W (STAPLE) ×3 IMPLANT
SUCTION POOLE HANDLE (INSTRUMENTS)
SUT ETHILON 3-0 FS-10 30 BLK (SUTURE) ×3
SUT PDS AB 0 CT1 27 (SUTURE) ×3 IMPLANT
SUT PDS AB 1 TP1 96 (SUTURE) ×6 IMPLANT
SUT SILK 2 0 (SUTURE) ×2
SUT SILK 2 0 SH CR/8 (SUTURE) ×3 IMPLANT
SUT SILK 2 0SH CR/8 30 (SUTURE) ×3 IMPLANT
SUT SILK 2-0 18XBRD TIE 12 (SUTURE) ×1 IMPLANT
SUT VIC AB 0 CT1 36 (SUTURE) ×6 IMPLANT
SUT VIC AB 2-0 SH 27 (SUTURE) ×8
SUT VIC AB 2-0 SH 27XBRD (SUTURE) ×4 IMPLANT
SUTURE EHLN 3-0 FS-10 30 BLK (SUTURE) ×1 IMPLANT
SWAB CULTURE AMIES ANAERIB BLU (MISCELLANEOUS) ×3 IMPLANT
SYR 30ML LL (SYRINGE) ×6 IMPLANT
SYR 3ML LL SCALE MARK (SYRINGE) IMPLANT
TAPE MICROFOAM 4IN (TAPE) IMPLANT
TRAY FOLEY W/METER SILVER 16FR (SET/KITS/TRAYS/PACK) ×3 IMPLANT
WAFER FLANGE 1 3/4 SMALL GREEN (OSTOMY) ×3 IMPLANT

## 2016-12-31 NOTE — Progress Notes (Addendum)
Patient seen and examined. She has done remarkably well. She is still tachycardic and showing signs of sepsis. Hemoglobin is stable Urine output is about 350 cc per nursing records  PE NAD Abd: soft, JP serosanguinous, colostomy pink and patent, no gas No peritonitis  A/P Doing well Continue resuscitation, will add albumin and may need crystalloid bolus Keep foley and NGT May transfer to floor later today PICC lina and TPN tomorrow expected ileus D/w nursing staff in detail about importance of strict I/O and continuation of IVF

## 2016-12-31 NOTE — Progress Notes (Signed)
Notes and surgery report  reviewed today   Met with pt and her husband tonight and again reviewed the initial surgery with the help of Dr Hampton Abbot.  Probable delayed perforation which led to abscess formation . Pt stable for now  and under the care of Dr Dahlia Byes.  I will arrange for the oncologist to speak with the pt while she is in the hospital to give the next plan of care .  I will continue to follow .   Gwen Her Demetrias Goodbar MD

## 2016-12-31 NOTE — Anesthesia Postprocedure Evaluation (Signed)
Anesthesia Post Note  Patient: Anna [name]  Procedure(s) Performed: Procedure(s) (LRB): EXPLORATORY LAPAROTOMY, sigmoid colectomy, removal of omentum, colostomy (N/A)  Patient location during evaluation: ICU Anesthesia Type: General Level of consciousness: awake, awake and alert, oriented and patient cooperative Pain management: pain level controlled Vital Signs Assessment: post-procedure vital signs reviewed and stable Respiratory status: spontaneous breathing, nonlabored ventilation and respiratory function stable Cardiovascular status: stable Anesthetic complications: no     Last Vitals:  Vitals:   12/31/16 0600 12/31/16 0700  BP: 130/70 132/72  Pulse: (!) 118 (!) 115  Resp: 19 (!) 22  Temp:      Last Pain:  Vitals:   12/31/16 0530  TempSrc:   PainSc: 2                  Carlye Panameno,  Shayann Garbutt R

## 2016-12-31 NOTE — H&P (Signed)
H/P ( Note done under different tittle before)  HPI Anna [name] is a 51 y.o. 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 Shepresents with increased abdominal pain, nausea vomiting and diarrhea. She states that she has never felt well after the surgery and she is has taken a significant amount of time to recover. She does feel weak. Decrease appetite. He went to her gynecology oncology office today for a follow-up visit and was told to go to the emergency room for further workup. Here in the ER her white count was 41,000, sodium was 128 and chloride of 81 BUN of 21. CT scan I have personally reviewed there is evidence of a large abscess along with a perforation in the sigmoid colon. There is also evidence of free air and significant inflammatory response throughout the descending colon.  HPI      Past Medical History:  Diagnosis Date  . Cancer ([place])   . Colon polyp   . Diverticulosis   . Fibroid uterus   . HH (hiatus hernia)          Past Surgical History:  Procedure Laterality Date  . COLONOSCOPY    . CYSTOSCOPY  12/20/2016   Procedure: CYSTOSCOPY;  Surgeon: Boykin Nearing, MD;  Location: [facility] ORS;  Service: Gynecology;;  . ESOPHAGOGASTRODUODENOSCOPY    . LAPAROSCOPIC BILATERAL SALPINGECTOMY Bilateral 12/20/2016   Procedure: LAPAROSCOPIC BILATERAL SALPINGOOPHERECTOMY,  Excision of anterior 5 cm calcified uterine fibroid;  Surgeon: Boykin Nearing, MD;  Location: [facility] ORS;  Service: Gynecology;  Laterality: Bilateral;  . LAPAROSCOPIC LYSIS OF ADHESIONS  12/20/2016   Procedure: LAPAROSCOPIC LYSIS OF ADHESIONS WITH DISSECTION OF LEFT TUBE AND OVARY.;  Surgeon: Olean Ree, MD;  Location: [facility] ORS;  Service: General;;  . SIGMOIDOSCOPY  12/20/2016   Procedure: RIGID SIGMOIDOSCOPY;  Surgeon: Olean Ree, MD;  Location: [facility] ORS;  Service:  General;;  . TONSILLECTOMY           Family History  Problem Relation Age of Onset  . Colon cancer Mother 62  . Diabetes Mother   . Asthma Father   . Breast cancer Neg Hx     Social History     Social History  Substance Use Topics  . Smoking status: Never Smoker  . Smokeless tobacco: Never Used  . Alcohol use No         Allergies  Allergen Reactions  . Penicillins Rash    As a child. Has patient had a PCN reaction causing immediate rash, facial/tongue/throat swelling, SOB or lightheadedness with hypotension: Unknown Has patient had a PCN reaction causing severe rash involving mucus membranes or skin necrosis: Unknown Has patient had a PCN reaction that required hospitalization: No Has patient had a PCN reaction occurring within the last 10 years: No If all of the above answers are "NO", then may proceed with Cephalosporin use.              Current Facility-Administered Medications  Medication Dose Route Frequency Provider Last Rate Last Dose  . albumin human 25 % solution 12.5 g  12.5 g Intravenous Once [place]on, [place] F, MD      . famotidine (PEPCID) IVPB 20 mg premix  20 mg Intravenous Q12H Schermerhorn, Gwen Her, MD      . lactated ringers infusion   Intravenous Continuous Schermerhorn, Gwen Her, MD      . levofloxacin (LEVAQUIN) IVPB 750 mg  750  mg Intravenous Q24H Carrie Mew, MD      . meperidine (DEMEROL) injection 50 mg  50 mg Intravenous Q4H PRN Schermerhorn, Gwen Her, MD      . metroNIDAZOLE (FLAGYL) IVPB 500 mg  500 mg Intravenous Vida Roller, MD      . ondansetron [facility]) tablet 4 mg  4 mg Oral Q6H PRN Schermerhorn, Gwen Her, MD       Or  . ondansetron [facility]) injection 4 mg  4 mg Intravenous Q6H PRN Schermerhorn, Gwen Her, MD      . prenatal multivitamin tablet 1 tablet  1 tablet Oral Q1200 Schermerhorn, Gwen Her, MD             Current Outpatient Prescriptions  Medication Sig Dispense Refill  . acetaminophen (TYLENOL)  500 MG tablet Take 500 mg by mouth as needed for mild pain.    Marland Kitchen estradiol (VIVELLE-DOT) 0.025 MG/24HR Place 1 patch onto the skin 2 (two) times a week.    Marland Kitchen HYDROcodone-acetaminophen (NORCO/VICODIN) 5-325 MG tablet Take 1 tablet by mouth every 6 (six) hours as needed for moderate pain.    . naproxen (NAPROSYN) 500 MG tablet Take 500 mg by mouth 2 (two) times daily with a meal.       Review of Systems Full ROS  was asked and was negative except for the information on the HPI  Physical Exam Blood pressure 133/76, pulse (!) 118, temperature 98.3 F (36.8 C), temperature source Oral, resp. rate (!) 24, height 5\' 3"  (1.6 m), weight 60.8 kg (134 lb), last menstrual period 03/01/2015, SpO2 96 %. CONSTITUTIONAL: debilitated female , tachycardic EYES: Pupils are equal, round, and reactive to light, Sclera are non-icteric. EARS, NOSE, MOUTH AND THROAT: The oropharynx is clear. The oral mucosa is pink and moist. Hearing is intact to voice. LYMPH NODES:  Lymph nodes in the neck are normal. RESPIRATORY:  Lungs are clear. There is normal respiratory effort, with equal breath sounds bilaterally, and without pathologic use of accessory muscles. CARDIOVASCULAR: Heart is regular without murmurs, Tachy in the 130s no  gallops, or rubs. GI: is midly distended, incisions c/d/I. There is some focal peritonitis suprapubic area w rebound tenderness GU: Rectal deferred.   MUSCULOSKELETAL: Normal muscle strength and tone. No cyanosis or edema.   SKIN: Turgor is good and there are no pathologic skin lesions or ulcers. NEUROLOGIC: Motor and sensation is grossly normal. Cranial nerves are grossly intact. PSYCH:  Oriented to person, place and time. Affect is normal.  Data Reviewed  I have personally reviewed the patient's imaging, laboratory findings and medical records.    Assessment   Plan 51 year old female with an acute abdomen following recent gynecological surgery involving significant  lysis of adhesions now with evidence of perforation in the sigmoid colon and with signs of sepsis including tachycardia increasing white count and an acute abdomen on physical exam. I do think that the best course of action is to take the patient to the operating room for exploratory laparotomy and possibly Hartmann's procedure. We'll definitely start resuscitating her to try to correct her electrolyte abnormalities but I do think that the most prudent and safest thing to do is to explore her. I discussed with her and the husband in detail about the operation, risk benefits and possible complications including but not limited to: Bleeding, infection, anesthetic complications, chronic pain, prolonged hospitalization and even death. They understand and wish to proceed. Unfortunately I do not see any other reasonable alternative other than laparotomy for damage  control. We will also start broad-spectrum antibiotics. For all of the above obvious reasons I do consider this a surgical emergency.  Caroleen Hamman, MD [facility] General Surgeon

## 2016-12-31 NOTE — ED Provider Notes (Signed)
-----------------------------------------  12:16 AM on 12/31/2016 -----------------------------------------   Blood pressure 133/76, pulse (!) 118, temperature 98.3 F (36.8 C), temperature source Oral, resp. rate (!) 24, height 5' 3" (1.6 m), weight 134 lb (60.8 kg), last menstrual period 03/01/2015, SpO2 96 %.  Assuming care from Dr. Joni Fears.  In short, Anna [name] is a 51 y.o. female with a chief complaint of Shortness of Breath and Weakness .  Refer to the original H&P for additional details.  The current plan of care is to disposition the patient.      The patient was seen by the surgeon and he feels that transfer and the patient will delay her care. She needs surgery immediately to drain her abscesses with resection of her sigmoid. I spoke to Dr. Drue Flirt at [facility] who is GYN oncology and he agrees that the patient needs her emergent surgical needs met and they will consider taking on the patient afterwards. The patient will be admitted to the surgical service and Dr. Ambrose Mantle, Eduard Roux, MD 12/31/16 [phone]

## 2016-12-31 NOTE — Progress Notes (Addendum)
Pharmacy Consult for Electrolyte Monitoring and Replacement Indication: hypokalemia  Allergies  Allergen Reactions  . Penicillins Rash    As a child. Has patient had a PCN reaction causing immediate rash, facial/tongue/throat swelling, SOB or lightheadedness with hypotension: Unknown Has patient had a PCN reaction causing severe rash involving mucus membranes or skin necrosis: Unknown Has patient had a PCN reaction that required hospitalization: No Has patient had a PCN reaction occurring within the last 10 years: No If all of the above answers are "NO", then may proceed with Cephalosporin use.     Patient Measurements: Height: 5\' 3"  (160 cm) Weight: 143 lb 11.8 oz (65.2 kg) IBW/kg (Calculated) : 52.4  Vital Signs: Temp: 98.6 F (37 C) (05/08 1200) Temp Source: Oral (05/08 1200) BP: 119/80 (05/08 1100) Pulse Rate: 124 (05/08 1100) Intake/Output from previous day: 05/07 0701 - 05/08 0700 In: 8891 [I.V.:3600; IV Piggyback:250] Out: 540 [Urine:300; Drains:90; Blood:100] Intake/Output from this shift: Total I/O In: 870 [I.V.:720; IV Piggyback:150] Out: 50 [Urine:50]  Labs:  Recent Labs  12/30/16 1803 12/31/16 0627  WBC 41.0* 33.2*  HGB 11.9* 10.5*  HCT 34.7* 31.1*  PLT 635* 580*  CREATININE 0.72 0.62  MG  --  1.7  PHOS  --  2.9  ALBUMIN  --  1.7*  PROT  --  4.5*  AST  --  43*  ALT  --  30  ALKPHOS  --  100  BILITOT  --  3.0*   Estimated Creatinine Clearance: 74.7 mL/min (by C-G formula based on SCr of 0.62 mg/dL).   Potassium (mmol/L)  Date Value  12/31/2016 3.2 (L)   Sodium (mmol/L)  Date Value  12/31/2016 130 (L)   Calcium (mg/dL)  Date Value  12/31/2016 7.3 (L)   CCa= 9.1   Medical History: Past Medical History:  Diagnosis Date  . Cancer ([place])   . Colon polyp   . Diverticulosis   . Fibroid uterus   . HH (hiatus hernia)     Assessment: 51 y/o F s/p ex lap for perforated sigmoid colon.    Plan:  Potassium chloride 40 meq po once and  magnesium sulfate 2 g iv once. Will recheck K at 1800.   5/8 1804 K WNL. Recheck with AM labs. -NAC  Ulice Dash D 12/31/2016,3:07 PM

## 2016-12-31 NOTE — Op Note (Addendum)
PROCEDURES: 1. lysis of adhesions 2. Hartmann's procedure 3. Omentectomy  4. Drainage of multiple interloop abscesses 5. Placement blake drains x 2  Pre-operative Diagnosis: perforated sigmoid colon  Post-operative Diagnosis: same  Anesthesia: General endotracheal anesthesia  ASA Class: 3  Surgeon: Caroleen Hamman , MD FACS  Anesthesia: Gen. with endotracheal tube  Findings: Perforated mid sigmoid colon with purulent peritonitis and obvious contamination of the abdominal cavity. Multiple interloop small bowel abscesses and pelvic abscess Necrotic omentum Significant inflammatory response within the small bowel secondary to perforation. Very hostile abdomen due to significant inflammatory response  Estimated Blood Loss: 100cc         Drains: x 2 # 19 FR blake drain one in the pelvis and the other on the right side          Specimens: Sigmoid colon and greater omentum          Complications: none          Procedure Details  The patient was seen again in the Holding Room. The benefits, complications, treatment options, and expected outcomes were discussed with the patient. The risks of bleeding, infection, recurrence of symptoms, failure to resolve symptoms,  bowel injury, any of which could require further surgery were reviewed with the patient.   The patient was taken to Operating Room, identified as Anna [name] and the procedure verified.  A Time Out was held and the above information confirmed.  Prior to the induction of general anesthesia, antibiotic prophylaxis was administered. VTE prophylaxis was in place. General endotracheal anesthesia was then administered and tolerated well. After the induction, the abdomen was prepped with Chloraprep and draped in the sterile fashion. The patient was positioned in the supine position.   Midline laparotomy was performance standard fashion using 10 blade knife. Electrocautery was used to dissect through the cutaneous tissue and  incised the fascia. The peritoneum was elevated and entered sharply. The abdominal incision was extended superiorly and inferiorly to have adequate exposure. We encountered significant possible once we entered into abdominal cavity. We drained multiple interloop abscesses and once we entered the multiple cavities. We obtained cultures. Also dissection was used and a finger fracture technique was used to drain a pelvic abscess that was in continuity with the perforation. The abdomen was very hostile due to significant inflammatory response and there was significant thick adhesions from the small bowel to small bowel and from the abdominal wall to the small bowel. Very meticulous dissection was used with a combination of sharp Metzenbaum's and finger fracturing. Once we have a good exposure of the small bowel were able to visualize and to gain adequate exposure to the pelvis. The omentum and small bowel was retracted cephalad and we gained access to the pelvis and visualize the perforation in the sigmoid colon there was significant inflammatory response around it. Using a Balfour retractor and a bladder blade  retractor  I was able to gain more exposure to the pelvis and commenced our dissection. Cautery was used to score the mesentery of the sigmoid. We were very careful to stay in close proximity to the sigmoid colon. Using LigaSure device we were able to divide the mesentery of the sigmoid colon and creating ad windowed distally to the perforation. Once we have adequate circumferential dissection around the distal sigmoid we divided the sigmoid using standard contour stapler. Attention then was turned to the proximal side and using the LigaSure device were able to divide the mesentery until a proximal viable area was  identified. The proximal colon was divided with a standard Endo GIA 75 mm stapler. We were very careful not to injure the ureter and because of the significant inflammatory response we were unable to  visualize it but we stayed very close to the sigmoid colon to prevent any potential injury. The abdominal cavity was irrigated with multiple liters of warm normal saline until clear fluid was aspirated. We confirmed adequate position of the NG tube. The small bowel was run from the ligament of Treitz all the way to the terminal ileum and there was no evidence of bowel injury again there was significant inflammatory response reactive from the perforation. The attention was turned to the greater omentum that there was a significant portion that was necrotic. Using the LigaSure device were able to divide the greater omentum in the standard fashion. The specimen was sent.  A 19 Blake drain was placed in the pelvis and another drain was placed in the right side were multiple interloop abscesses were already drained.  The drains weresutured in place with a 2-0 silk. A second look showed no evidence of any bleeding or any other injuries.  We created the defect on the left side of the abdominal wall to be able to externalize the colon. We perform a cruciate incision on the fascia and make sure we brought the descending colon stump through the defect and through the muscle.  We changed gloves and place a new tray to close the abdomen with a 0 PDS suture in a running fashion. wet to dry was used as a dressing for the sub q. The Colostomy was matured in the standard fashion using multiple interrupted 2-0 Vicryl. Liposomal Marcaine was injected on the incision site incorporation sub q and fascia under direct visualization.  Needle and laparotomy count were correct and there were no immediate complications.  Caroleen Hamman, MD, FACS

## 2016-12-31 NOTE — Transfer of Care (Signed)
Immediate Anesthesia Transfer of Care Note  Patient: Anna [name]  Procedure(s) Performed: Procedure(s): EXPLORATORY LAPAROTOMY, sigmoid colectomy, removal of omentum, colostomy (N/A)  Patient Location: PACU  Anesthesia Type:General  Level of Consciousness: awake, alert , oriented and patient cooperative  Airway & Oxygen Therapy: Patient Spontanous Breathing and Patient connected to face mask oxygen  Post-op Assessment: Report given to RN and Post -op Vital signs reviewed and stable  Post vital signs: Reviewed and stable  Last Vitals:  Vitals:   12/30/16 2330 12/31/16 0000  BP: 133/76 136/74  Pulse: (!) 118 (!) 134  Resp: (!) 24 (!) 21  Temp:      Last Pain:  Vitals:   12/30/16 2157  TempSrc: Oral  PainSc:          Complications: No apparent anesthesia complications

## 2016-12-31 NOTE — Anesthesia Post-op Follow-up Note (Cosign Needed)
Anesthesia QCDR form completed.        

## 2016-12-31 NOTE — Progress Notes (Signed)
Pharmacy Antibiotic Note  Anna [name] is a 51 y.o. female admitted on 12/30/2016 with intra-abdominal infection.  Pharmacy has been consulted for levaquin/metronidazole dosing.  Plan: Will continue levaquin 750 mg IV daily and metronidazole 500 mg IV q8h. QTc 452 5/7  Height: 5\' 3"  (160 cm) Weight: 143 lb 11.8 oz (65.2 kg) IBW/kg (Calculated) : 52.4  Temp (24hrs), Avg:98.4 F (36.9 C), Min:97.9 F (36.6 C), Max:98.9 F (37.2 C)   Recent Labs Lab 12/30/16 1803 12/31/16 0627 12/31/16 0734  WBC 41.0* 33.2*  --   CREATININE 0.72 0.62  --   LATICACIDVEN  --   --  1.0    Estimated Creatinine Clearance: 74.7 mL/min (by C-G formula based on SCr of 0.62 mg/dL).    Allergies  Allergen Reactions  . Penicillins Rash    As a child. Has patient had a PCN reaction causing immediate rash, facial/tongue/throat swelling, SOB or lightheadedness with hypotension: Unknown Has patient had a PCN reaction causing severe rash involving mucus membranes or skin necrosis: Unknown Has patient had a PCN reaction that required hospitalization: No Has patient had a PCN reaction occurring within the last 10 years: No If all of the above answers are "NO", then may proceed with Cephalosporin use.      Thank you for allowing pharmacy to be a part of this patient's care.  Ulice Dash, PharmD Clinical Pharmacist  12/31/2016

## 2016-12-31 NOTE — Hospital Discharge Follow-Up (Addendum)
Patient ID: Anna [name], female   DOB: 03/06/1966, 51 y.o.   MRN: [id number]  HPI Anna [name] is a 51 y.o. 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 Shepresents with increased abdominal pain, nausea vomiting and diarrhea. She states that she has never felt well after the surgery and she is has taken a significant amount of time to recover. She does feel weak. Decrease appetite. He went to her gynecology oncology office today for a follow-up visit and was told to go to the emergency room for further workup. Here in the ER her white count was 41,000, sodium was 128 and chloride of 81 BUN of 21. CT scan I have personally reviewed there is evidence of a large abscess along with a perforation in the sigmoid colon. There is also evidence of free air and significant inflammatory response throughout the descending colon.  HPI  Past Medical History:  Diagnosis Date  . Cancer ([place])   . Colon polyp   . Diverticulosis   . Fibroid uterus   . HH (hiatus hernia)     Past Surgical History:  Procedure Laterality Date  . COLONOSCOPY    . CYSTOSCOPY  12/20/2016   Procedure: CYSTOSCOPY;  Surgeon: Boykin Nearing, MD;  Location: [facility];  Service: Gynecology;;  . ESOPHAGOGASTRODUODENOSCOPY    . LAPAROSCOPIC BILATERAL SALPINGECTOMY Bilateral 12/20/2016   Procedure: LAPAROSCOPIC BILATERAL SALPINGOOPHERECTOMY,  Excision of anterior 5 cm calcified uterine fibroid;  Surgeon: Boykin Nearing, MD;  Location: [facility];  Service: Gynecology;  Laterality: Bilateral;  . LAPAROSCOPIC LYSIS OF ADHESIONS  12/20/2016   Procedure: LAPAROSCOPIC LYSIS OF ADHESIONS WITH DISSECTION OF LEFT TUBE AND OVARY.;  Surgeon: Olean Ree, MD;  Location: [facility];  Service: General;;  . SIGMOIDOSCOPY  12/20/2016   Procedure: RIGID SIGMOIDOSCOPY;  Surgeon: Olean Ree, MD;  Location: [facility];  Service:  General;;  . TONSILLECTOMY      Family History  Problem Relation Age of Onset  . Colon cancer Mother 35  . Diabetes Mother   . Asthma Father   . Breast cancer Neg Hx     Social History Social History  Substance Use Topics  . Smoking status: Never Smoker  . Smokeless tobacco: Never Used  . Alcohol use No    Allergies  Allergen Reactions  . Penicillins Rash    As a child. Has patient had a PCN reaction causing immediate rash, facial/tongue/throat swelling, SOB or lightheadedness with hypotension: Unknown Has patient had a PCN reaction causing severe rash involving mucus membranes or skin necrosis: Unknown Has patient had a PCN reaction that required hospitalization: No Has patient had a PCN reaction occurring within the last 10 years: No If all of the above answers are "NO", then may proceed with Cephalosporin use.     Current Facility-Administered Medications  Medication Dose Route Frequency Provider Last Rate Last Dose  . albumin human 25 % solution 12.5 g  12.5 g Intravenous Once Jazman Reuter, [place] F, MD      . famotidine (PEPCID) IVPB 20 mg premix  20 mg Intravenous Q12H Schermerhorn, Gwen Her, MD      . lactated ringers infusion   Intravenous Continuous Schermerhorn, Gwen Her, MD      . levofloxacin (LEVAQUIN) IVPB 750 mg  750 mg Intravenous Q24H Carrie Mew, MD      . meperidine (DEMEROL) injection 50 mg  50 mg Intravenous Q4H PRN Schermerhorn,  Gwen Her, MD      . metroNIDAZOLE (FLAGYL) IVPB 500 mg  500 mg Intravenous Vida Roller, MD      . ondansetron [facility]) tablet 4 mg  4 mg Oral Q6H PRN Schermerhorn, Gwen Her, MD       Or  . ondansetron [facility]) injection 4 mg  4 mg Intravenous Q6H PRN Schermerhorn, Gwen Her, MD      . prenatal multivitamin tablet 1 tablet  1 tablet Oral Q1200 Schermerhorn, Gwen Her, MD       Current Outpatient Prescriptions  Medication Sig Dispense Refill  . acetaminophen (TYLENOL) 500 MG tablet Take 500 mg by mouth as needed for  mild pain.    Marland Kitchen estradiol (VIVELLE-DOT) 0.025 MG/24HR Place 1 patch onto the skin 2 (two) times a week.    Marland Kitchen HYDROcodone-acetaminophen (NORCO/VICODIN) 5-325 MG tablet Take 1 tablet by mouth every 6 (six) hours as needed for moderate pain.    . naproxen (NAPROSYN) 500 MG tablet Take 500 mg by mouth 2 (two) times daily with a meal.       Review of Systems Full ROS  was asked and was negative except for the information on the HPI  Physical Exam Blood pressure 133/76, pulse (!) 118, temperature 98.3 F (36.8 C), temperature source Oral, resp. rate (!) 24, height 5\' 3"  (1.6 m), weight 60.8 kg (134 lb), last menstrual period 03/01/2015, SpO2 96 %. CONSTITUTIONAL: debilitated female , tachycardic EYES: Pupils are equal, round, and reactive to light, Sclera are non-icteric. EARS, NOSE, MOUTH AND THROAT: The oropharynx is clear. The oral mucosa is pink and moist. Hearing is intact to voice. LYMPH NODES:  Lymph nodes in the neck are normal. RESPIRATORY:  Lungs are clear. There is normal respiratory effort, with equal breath sounds bilaterally, and without pathologic use of accessory muscles. CARDIOVASCULAR: Heart is regular without murmurs, Tachy in the 130s no  gallops, or rubs. GI: is midly distended, incisions c/d/I. There is some focal peritonitis suprapubic area w rebound tenderness GU: Rectal deferred.   MUSCULOSKELETAL: Normal muscle strength and tone. No cyanosis or edema.   SKIN: Turgor is good and there are no pathologic skin lesions or ulcers. NEUROLOGIC: Motor and sensation is grossly normal. Cranial nerves are grossly intact. PSYCH:  Oriented to person, place and time. Affect is normal.  Data Reviewed  I have personally reviewed the patient's imaging, laboratory findings and medical records.    Assessment   Plan 51 year old female with an acute abdomen following recent gynecological surgery involving significant lysis of adhesions now with evidence of perforation in the sigmoid  colon and with signs of sepsis including tachycardia increasing white count and an acute abdomen on physical exam. I do think that the best course of action is to take the patient to the operating room for exploratory laparotomy and possibly Hartmann's procedure. We'll definitely start resuscitating her to try to correct her electrolyte abnormalities but I do think that the most prudent and safest thing to do is to explore her. I discussed with her and the husband in detail about the operation, risk benefits and possible complications including but not limited to: Bleeding, infection, anesthetic complications, chronic pain, prolonged hospitalization and even death. They understand and wish to proceed. Unfortunately I do not see any other reasonable alternative other than laparotomy for damage control. We will also start broad-spectrum antibiotics. For all of the above obvious reasons I do consider this a surgical emergency.  Caroleen Hamman, MD [facility] 12/31/2016, 12:07  AM

## 2016-12-31 NOTE — Anesthesia Procedure Notes (Signed)
Procedure Name: Intubation Date/Time: 12/31/2016 1:23 AM Performed by: Lendon Colonel Pre-anesthesia Checklist: Patient identified, Patient being monitored, Timeout performed, Emergency Drugs available and Suction available Patient Re-evaluated:Patient Re-evaluated prior to inductionOxygen Delivery Method: Circle system utilized Preoxygenation: Pre-oxygenation with 100% oxygen Intubation Type: IV induction, Rapid sequence and Cricoid Pressure applied Laryngoscope Size: Miller and 2 Grade View: Grade II Tube type: Oral Tube size: 7.0 mm Number of attempts: 1 Airway Equipment and Method: Stylet Placement Confirmation: ETT inserted through vocal cords under direct vision,  positive ETCO2 and breath sounds checked- equal and bilateral Secured at: 20 cm Tube secured with: Tape Dental Injury: Teeth and Oropharynx as per pre-operative assessment

## 2016-12-31 NOTE — Consult Note (Signed)
Name: Anna [name] MRN: [id number] DOB: 03/13/1966    ADMISSION DATE:  12/30/2016 CONSULTATION DATE:  12/31/2016  REFERRING MD :  Dr. Dahlia Byes  CHIEF COMPLAINT:  s/p Exploratory Laporotomy due to perforated sigmoid colon with purulent peritonitis and contamination of the abdominal cavity  BRIEF PATIENT DESCRIPTION:  51 yo female admitted 05/7 s/p exploratory laparotomy due perforated sigmoid colon with placement of bilateral blake drains and LUQ colostomy.  SIGNIFICANT EVENTS  05/8-Pt admitted to ICU s/p exploratory laparotomy   STUDIES:  CT Abd Pelvis and CT Angio Chest 05/7>>Perforation at the mid sigmoid colon, with a large contiguous collection of free fluid and air tracking across the right side of the abdomen and left hemipelvis, measuring 20-30 cm in size. Underlying diffuse mesenteric edema, with scattered additional free air throughout the abdomen and pelvis. Findings compatible with evolving abscess throughout the abdomen and pelvis. Diffuse wall thickening along the ascending and proximal sigmoid colon, likely reflecting underlying infection. Diffuse distention of small-bowel loops likely reflects underlying ileus. No evidence of pulmonary embolus. Scattered small nodules of varying size throughout the lungs, measuring up to 1.1 cm in size. These are suspicious for metastatic disease, given the patient's known metastatic adenocarcinoma. Vague 1.4 cm hypodensity at the left hepatic lobe raises suspicion for metastatic disease. Retroperitoneal nodes measure up to 1.2 cm in short axis. This is nonspecific, though metastatic disease cannot be excluded. Underlying retroperitoneal edema noted. Irregular enhancing nodules arising at the uterus. These may reflect uterine fibroids, though metastatic disease cannot be excluded. No definite breast or pancreatic mass is seen. Given recent surgical pathology results, the primary source of the patient's adenocarcinoma is unclear.  HISTORY OF  PRESENT ILLNESS:   This is a 51 yo female with a PMH of Hiatal Hernia, Fibroid Uterus, Diverticulosis, and Colon Polyp. 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  According to the pt she never felt well following the surgery. She was suppose to get scheduled with [facility] by her OBGYN office yest 05/7 for additional workup and treatment recommendations.  However, she presented to [facility] ER 05/7 after a follow-up visit with her OBGYN who instructed her to come to the ER for further workup due to pt c/o abdominal pain, nausea, vomiting, diarrhea, poor po intake, and shortness of breath.  In the ER lab results revealed WBC 41,000, Na 128, chloride 81, and BUN 21. In the ER CT Abd Pelvis and CT Angio Chest results revealed a large abscess along with a perforation in the sigmoid colon, evidence of free air and significant inflammatory response throughout the descending colon.  Due to abnormal CT results surgery consult placed by ER physician who recommended emergent exploratory laparotomy to drain abscesses with resection of her sigmoid colon.  ER physician spoke with Dr. Drue Flirt at [facility] who is GYN Oncology who agreed with emergent surgical needs and he would consider pt transfer to [facility] once stable postop if necessary.  Therefore, she was emergently taken to the OR for lysis of adhesions, hartmann's procedure, omentectomy, drainage of multiple interloop abscesses, LUQ colostomy, and placement of bilateral blake drains on 05/8.  Following surgery she was admitted to the ICU for further workup and management and PCCM consulted.  PAST MEDICAL HISTORY :   has a past medical history of Cancer ([place]); Colon polyp; Diverticulosis; Fibroid uterus; and HH (hiatus hernia).  has a past surgical history that includes Tonsillectomy; Colonoscopy;  Esophagogastroduodenoscopy; Laparoscopic bilateral salpingectomy (Bilateral, 12/20/2016); Cystoscopy (12/20/2016); Laparoscopic lysis of adhesions (12/20/2016); and Sigmoidoscopy (12/20/2016). Prior to Admission medications   Medication Sig Start Date End Date Taking? Authorizing Provider  acetaminophen (TYLENOL) 500 MG tablet Take 500 mg by mouth as needed for mild pain.   Yes [provider]  estradiol (VIVELLE-DOT) 0.025 MG/24HR Place 1 patch onto the skin 2 (two) times a week.   Yes [provider]  HYDROcodone-acetaminophen (NORCO/VICODIN) 5-325 MG tablet Take 1 tablet by mouth every 6 (six) hours as needed for moderate pain.   Yes [provider]  naproxen (NAPROSYN) 500 MG tablet Take 500 mg by mouth 2 (two) times daily with a meal.   Yes [provider]   Allergies  Allergen Reactions  . Penicillins Rash    As a child. Has patient had a PCN reaction causing immediate rash, facial/tongue/throat swelling, SOB or lightheadedness with hypotension: Unknown Has patient had a PCN reaction causing severe rash involving mucus membranes or skin necrosis: Unknown Has patient had a PCN reaction that required hospitalization: No Has patient had a PCN reaction occurring within the last 10 years: No If all of the above answers are "NO", then may proceed with Cephalosporin use.     FAMILY HISTORY:  family history includes Asthma in her father; Colon cancer (age of onset: 39) in her mother; Diabetes in her mother. SOCIAL HISTORY:  reports that she has never smoked. She has never used smokeless tobacco. She reports that she does not drink alcohol or use drugs.  REVIEW OF SYSTEMS: Positives in BOLD  Constitutional: fever, chills, weight loss, malaise/fatigue, poor po intake, and diaphoresis.  HENT: Negative for hearing loss, ear pain, nosebleeds, congestion, sore throat, neck pain, tinnitus and ear discharge.  Eyes: Negative for blurred vision, double vision,  photophobia, pain, discharge and redness.  Respiratory: cough, hemoptysis, sputum production, shortness of breath, wheezing and stridor.   Cardiovascular: Negative for chest pain, palpitations, orthopnea, claudication, leg swelling and PND.  Gastrointestinal: heartburn, nausea, vomiting, abdominal pain, diarrhea, constipation, blood in stool and melena.  Genitourinary: Negative for dysuria, urgency, frequency, hematuria and flank pain.  Musculoskeletal: Negative for myalgias, back pain, joint pain and falls.  Skin: Negative for itching and rash.  Neurological: dizziness, tingling, tremors, sensory change, speech change, focal weakness, seizures, loss of consciousness, weakness and headaches.  Endo/Heme/Allergies: Negative for environmental allergies and polydipsia. Does not bruise/bleed easily.  SUBJECTIVE:  Pt states she is currently not having pain, however does have abdominal pressure with sensation of needing to void.  VITAL SIGNS: Temp:  [97.9 F (36.6 C)-98.9 F (37.2 C)] 97.9 F (36.6 C) (05/08 0510) Pulse Rate:  [114-134] 125 (05/08 0540) Resp:  [12-24] 16 (05/08 0540) BP: (110-140)/(61-76) 130/66 (05/08 0540) SpO2:  [92 %-100 %] 92 % (05/08 0540) Weight:  [60.8 kg (134 lb)] 60.8 kg (134 lb) (05/07 1801)  PHYSICAL EXAMINATION: General: well developed Caucasian female, NAD Neuro: alert and oriented, following commands HEENT: supple, no JVD Cardiovascular: sinus tach, s1s2, no M/R/G Lungs: rhonchi throughout, even, non labored Abdomen: no audible bowel sounds present, midline abdominal dressing in place dry and intact, bilateral abdominal jp drains with serosanguinous drainage, LUQ colostomy Musculoskeletal: normal bulk and tone, no edema Skin: midline abdominal incision dressing dry and intact   Recent Labs Lab 12/30/16 1803  NA 128*  K 3.4*  CL 81*  CO2 32  BUN 21*  CREATININE 0.72  GLUCOSE 110*  Recent Labs Lab 12/30/16 1803  HGB 11.9*  HCT 34.7*  WBC  41.0*  PLT 635*   Ct Angio Chest Pe W Or Wo Contrast  Result Date: 12/30/2016 CLINICAL DATA:  Acute onset of generalized weakness and lethargy. Shortness of breath, generalized abdominal pain and diarrhea. Vomiting. Status post recent bilateral salpingo-oophorectomy. Initial encounter. EXAM: CT ANGIOGRAPHY CHEST CT ABDOMEN AND PELVIS WITH CONTRAST TECHNIQUE: Multidetector CT imaging of the chest was performed using the standard protocol during bolus administration of intravenous contrast. Multiplanar CT image reconstructions and MIPs were obtained to evaluate the vascular anatomy. Multidetector CT imaging of the abdomen and pelvis was performed using the standard protocol during bolus administration of intravenous contrast. CONTRAST:  75 mL of Isovue 370 IV contrast COMPARISON:  None. FINDINGS: CTA CHEST FINDINGS Cardiovascular:  There is no evidence of pulmonary embolus. The heart remains normal in size. The thoracic aorta is unremarkable in appearance. No calcific atherosclerotic disease is seen. The great vessels are unremarkable in appearance. Mediastinum/Nodes: No mediastinal lymphadenopathy is seen. No pericardial effusion is identified. The thyroid gland is unremarkable. No axillary lymphadenopathy is appreciated. Lungs/Pleura: Scattered small nodules of varying size are noted throughout the lungs, measuring up to 1.1 cm in size and suspicious for metastatic disease. Mild bibasilar atelectasis is noted. No pleural effusion or pneumothorax is seen. Musculoskeletal: No acute osseous abnormalities are identified. The visualized musculature is unremarkable in appearance. The breasts are unremarkable in appearance, though the lateral left breast is incompletely characterized. Review of the MIP images confirms the above findings. CT ABDOMEN and PELVIS FINDINGS Hepatobiliary: A vague 1.4 cm hypodensity at the left hepatic lobe raises suspicion for metastatic disease. The liver is otherwise unremarkable. The  gallbladder is grossly unremarkable in appearance. The common bile duct remains normal in caliber. Pancreas: The pancreas is grossly unremarkable in appearance. No definite pancreatic mass is identified. Spleen: The spleen is unremarkable in appearance. Adrenals/Urinary Tract: The adrenal glands are grossly unremarkable. The kidneys are within normal limits. There is no evidence of hydronephrosis. No renal or ureteral stones are identified. No perinephric stranding is seen. Stomach/Bowel: There appears to be perforation at the mid sigmoid colon, with a large contiguous collection of free fluid and air tracking across the right side of the abdomen and left hemipelvis, measuring 20-30 cm in size. Underlying diffuse mesenteric edema is noted, and scattered additional free air is seen tracking throughout the abdomen and pelvis. Findings are compatible with evolving abscess throughout the abdomen and pelvis. There is diffuse distention of small-bowel loops, concerning for underlying ileus. The stomach is grossly unremarkable in appearance. There is diffuse wall thickening along the ascending and proximal sigmoid colon, likely reflecting underlying infection. Vascular/Lymphatic: The abdominal aorta is unremarkable in appearance. The inferior vena cava is grossly unremarkable. Retroperitoneal nodes measure up to 1.2 cm in short axis. Diffuse retroperitoneal edema is seen. No definite pelvic sidewall lymphadenopathy is appreciated. Reproductive: Irregular enhancing nodules are seen arising at the uterus. These may reflect uterine fibroids, though metastatic disease cannot be excluded. The bladder is mildly distended and grossly unremarkable. Other: No additional soft tissue abnormalities are seen. Musculoskeletal: No acute osseous abnormalities are identified. The visualized musculature is unremarkable in appearance. Review of the MIP images confirms the above findings. IMPRESSION: 1. Perforation at the mid sigmoid colon,  with a large contiguous collection of free fluid and air tracking across the right side of the abdomen and left hemipelvis, measuring 20-30 cm in size. Underlying diffuse mesenteric edema, with scattered additional  free air throughout the abdomen and pelvis. Findings compatible with evolving abscess throughout the abdomen and pelvis. 2. Diffuse wall thickening along the ascending and proximal sigmoid colon, likely reflecting underlying infection. 3. Diffuse distention of small-bowel loops likely reflects underlying ileus. 4. No evidence of pulmonary embolus. 5. Scattered small nodules of varying size throughout the lungs, measuring up to 1.1 cm in size. These are suspicious for metastatic disease, given the patient's known metastatic adenocarcinoma. 6. Vague 1.4 cm hypodensity at the left hepatic lobe raises suspicion for metastatic disease. 7. Retroperitoneal nodes measure up to 1.2 cm in short axis. This is nonspecific, though metastatic disease cannot be excluded. Underlying retroperitoneal edema noted. 8. Irregular enhancing nodules arising at the uterus. These may reflect uterine fibroids, though metastatic disease cannot be excluded. 9. No definite breast or pancreatic mass is seen. Given recent surgical pathology results, the primary source of the patient's adenocarcinoma is unclear. Critical Value/emergent results were called by telephone at the time of interpretation on 12/30/2016 at 10:51 pm to Dr. Carrie Mew, who verbally acknowledged these results. Electronically Signed   By: Garald Balding M.D.   On: 12/30/2016 23:05   Ct Abdomen Pelvis W Contrast  Result Date: 12/30/2016 CLINICAL DATA:  Acute onset of generalized weakness and lethargy. Shortness of breath, generalized abdominal pain and diarrhea. Vomiting. Status post recent bilateral salpingo-oophorectomy. Initial encounter. EXAM: CT ANGIOGRAPHY CHEST CT ABDOMEN AND PELVIS WITH CONTRAST TECHNIQUE: Multidetector CT imaging of the chest was  performed using the standard protocol during bolus administration of intravenous contrast. Multiplanar CT image reconstructions and MIPs were obtained to evaluate the vascular anatomy. Multidetector CT imaging of the abdomen and pelvis was performed using the standard protocol during bolus administration of intravenous contrast. CONTRAST:  75 mL of Isovue 370 IV contrast COMPARISON:  None. FINDINGS: CTA CHEST FINDINGS Cardiovascular:  There is no evidence of pulmonary embolus. The heart remains normal in size. The thoracic aorta is unremarkable in appearance. No calcific atherosclerotic disease is seen. The great vessels are unremarkable in appearance. Mediastinum/Nodes: No mediastinal lymphadenopathy is seen. No pericardial effusion is identified. The thyroid gland is unremarkable. No axillary lymphadenopathy is appreciated. Lungs/Pleura: Scattered small nodules of varying size are noted throughout the lungs, measuring up to 1.1 cm in size and suspicious for metastatic disease. Mild bibasilar atelectasis is noted. No pleural effusion or pneumothorax is seen. Musculoskeletal: No acute osseous abnormalities are identified. The visualized musculature is unremarkable in appearance. The breasts are unremarkable in appearance, though the lateral left breast is incompletely characterized. Review of the MIP images confirms the above findings. CT ABDOMEN and PELVIS FINDINGS Hepatobiliary: A vague 1.4 cm hypodensity at the left hepatic lobe raises suspicion for metastatic disease. The liver is otherwise unremarkable. The gallbladder is grossly unremarkable in appearance. The common bile duct remains normal in caliber. Pancreas: The pancreas is grossly unremarkable in appearance. No definite pancreatic mass is identified. Spleen: The spleen is unremarkable in appearance. Adrenals/Urinary Tract: The adrenal glands are grossly unremarkable. The kidneys are within normal limits. There is no evidence of hydronephrosis. No renal  or ureteral stones are identified. No perinephric stranding is seen. Stomach/Bowel: There appears to be perforation at the mid sigmoid colon, with a large contiguous collection of free fluid and air tracking across the right side of the abdomen and left hemipelvis, measuring 20-30 cm in size. Underlying diffuse mesenteric edema is noted, and scattered additional free air is seen tracking throughout the abdomen and pelvis. Findings are compatible with evolving abscess  throughout the abdomen and pelvis. There is diffuse distention of small-bowel loops, concerning for underlying ileus. The stomach is grossly unremarkable in appearance. There is diffuse wall thickening along the ascending and proximal sigmoid colon, likely reflecting underlying infection. Vascular/Lymphatic: The abdominal aorta is unremarkable in appearance. The inferior vena cava is grossly unremarkable. Retroperitoneal nodes measure up to 1.2 cm in short axis. Diffuse retroperitoneal edema is seen. No definite pelvic sidewall lymphadenopathy is appreciated. Reproductive: Irregular enhancing nodules are seen arising at the uterus. These may reflect uterine fibroids, though metastatic disease cannot be excluded. The bladder is mildly distended and grossly unremarkable. Other: No additional soft tissue abnormalities are seen. Musculoskeletal: No acute osseous abnormalities are identified. The visualized musculature is unremarkable in appearance. Review of the MIP images confirms the above findings. IMPRESSION: 1. Perforation at the mid sigmoid colon, with a large contiguous collection of free fluid and air tracking across the right side of the abdomen and left hemipelvis, measuring 20-30 cm in size. Underlying diffuse mesenteric edema, with scattered additional free air throughout the abdomen and pelvis. Findings compatible with evolving abscess throughout the abdomen and pelvis. 2. Diffuse wall thickening along the ascending and proximal sigmoid colon,  likely reflecting underlying infection. 3. Diffuse distention of small-bowel loops likely reflects underlying ileus. 4. No evidence of pulmonary embolus. 5. Scattered small nodules of varying size throughout the lungs, measuring up to 1.1 cm in size. These are suspicious for metastatic disease, given the patient's known metastatic adenocarcinoma. 6. Vague 1.4 cm hypodensity at the left hepatic lobe raises suspicion for metastatic disease. 7. Retroperitoneal nodes measure up to 1.2 cm in short axis. This is nonspecific, though metastatic disease cannot be excluded. Underlying retroperitoneal edema noted. 8. Irregular enhancing nodules arising at the uterus. These may reflect uterine fibroids, though metastatic disease cannot be excluded. 9. No definite breast or pancreatic mass is seen. Given recent surgical pathology results, the primary source of the patient's adenocarcinoma is unclear. Critical Value/emergent results were called by telephone at the time of interpretation on 12/30/2016 at 10:51 pm to Dr. Carrie Mew, who verbally acknowledged these results. Electronically Signed   By: Garald Balding M.D.   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  P: Supplemental O2 to maintain O2 sats >92% Prn CXR and ABG's Pulmonary hygiene Prn bronchodilator therapy  Continue abx Trend PCT's and Lactic acid Trend WBC's and monitor fever curve Aggressive fluid resuscitation to maintain map >65 if unable to maintain map goal following fluid resuscitation will start pressor therapy Follow cultures Trend BMP's Replace electrolytes as indicated  Monitor UOP Prn  morphine and toradol for pain management  Surgery consulted appreciate input Keep NPO for now will defer to surgery  Lovenox for VTE prophylaxis  Monitor for s/sx of bleeding Trend CBC's   Marda Stalker, [place] Pager [phone] (please enter 7 digits) PCCM Consult Pager [phone] (please enter 7 digits)

## 2016-12-31 NOTE — Progress Notes (Signed)
Pt now being transferred to room 203.  Leda Gauze from the IV team called to verify that Dr. Dahlia Byes wants PICC line placed due to pending blood cultures.  Dr. Dahlia Byes was paged and information was relayed.  Per Dr. Dahlia Byes, place PICC line in AM.  Will make Leda Gauze from the IV team aware.

## 2016-12-31 NOTE — Progress Notes (Signed)
Day of Surgery   Subjective:  Patient awake and conversant. Voiced understanding of what happened overnight. States she has some soreness to her lower abdomen but otherwise no complaints. He was weaned off of vasopressors early this morning.  Vital signs in last 24 hours: Temp:  [97.9 F (36.6 C)-98.9 F (37.2 C)] 97.9 F (36.6 C) (05/08 0510) Pulse Rate:  [114-134] 115 (05/08 0700) Resp:  [12-24] 22 (05/08 0700) BP: (110-140)/(61-76) 132/72 (05/08 0700) SpO2:  [92 %-100 %] 92 % (05/08 0700) Weight:  [60.8 kg (134 lb)-65.2 kg (143 lb 11.8 oz)] 65.2 kg (143 lb 11.8 oz) (05/08 0644) Last BM Date: 12/30/16  Intake/Output from previous day: 05/07 0701 - 05/08 0700 In: 3850 [I.V.:3600; IV Piggyback:250] Out: 540 [Urine:300; Drains:90; Blood:100]  GI: Abdomen is soft, nondistended, appropriately tender to palpation. Dressing is clean, dry, intact. Ostomy in place left lower quadrant without output. bilateral JP drains with serosanguineous output.  Lab Results:  CBC  Recent Labs  12/30/16 1803 12/31/16 0627  WBC 41.0* 33.2*  HGB 11.9* 10.5*  HCT 34.7* 31.1*  PLT 635* 580*   CMP     Component Value Date/Time   NA 130 (L) 12/31/2016 0627   K 3.2 (L) 12/31/2016 0627   CL 93 (L) 12/31/2016 0627   CO2 27 12/31/2016 0627   GLUCOSE 102 (H) 12/31/2016 0627   BUN 16 12/31/2016 0627   CREATININE 0.62 12/31/2016 0627   CALCIUM 7.3 (L) 12/31/2016 0627   PROT 4.5 (L) 12/31/2016 0627   ALBUMIN 1.7 (L) 12/31/2016 0627   AST 43 (H) 12/31/2016 0627   ALT 30 12/31/2016 0627   ALKPHOS 100 12/31/2016 0627   BILITOT 3.0 (H) 12/31/2016 0627   GFRNONAA >60 12/31/2016 0627   GFRAA >60 12/31/2016 6270   PT/INR No results for input(s): LABPROT, INR in the last 72 hours.  Studies/Results: Ct Angio Chest Pe W Or Wo Contrast  Result Date: 12/30/2016 CLINICAL DATA:  Acute onset of generalized weakness and lethargy. Shortness of breath, generalized abdominal pain and diarrhea. Vomiting. Status  post recent bilateral salpingo-oophorectomy. Initial encounter. EXAM: CT ANGIOGRAPHY CHEST CT ABDOMEN AND PELVIS WITH CONTRAST TECHNIQUE: Multidetector CT imaging of the chest was performed using the standard protocol during bolus administration of intravenous contrast. Multiplanar CT image reconstructions and MIPs were obtained to evaluate the vascular anatomy. Multidetector CT imaging of the abdomen and pelvis was performed using the standard protocol during bolus administration of intravenous contrast. CONTRAST:  75 mL of Isovue 370 IV contrast COMPARISON:  None. FINDINGS: CTA CHEST FINDINGS Cardiovascular:  There is no evidence of pulmonary embolus. The heart remains normal in size. The thoracic aorta is unremarkable in appearance. No calcific atherosclerotic disease is seen. The great vessels are unremarkable in appearance. Mediastinum/Nodes: No mediastinal lymphadenopathy is seen. No pericardial effusion is identified. The thyroid gland is unremarkable. No axillary lymphadenopathy is appreciated. Lungs/Pleura: Scattered small nodules of varying size are noted throughout the lungs, measuring up to 1.1 cm in size and suspicious for metastatic disease. Mild bibasilar atelectasis is noted. No pleural effusion or pneumothorax is seen. Musculoskeletal: No acute osseous abnormalities are identified. The visualized musculature is unremarkable in appearance. The breasts are unremarkable in appearance, though the lateral left breast is incompletely characterized. Review of the MIP images confirms the above findings. CT ABDOMEN and PELVIS FINDINGS Hepatobiliary: A vague 1.4 cm hypodensity at the left hepatic lobe raises suspicion for metastatic disease. The liver is otherwise unremarkable. The gallbladder is grossly unremarkable in appearance. The  common bile duct remains normal in caliber. Pancreas: The pancreas is grossly unremarkable in appearance. No definite pancreatic mass is identified. Spleen: The spleen is  unremarkable in appearance. Adrenals/Urinary Tract: The adrenal glands are grossly unremarkable. The kidneys are within normal limits. There is no evidence of hydronephrosis. No renal or ureteral stones are identified. No perinephric stranding is seen. Stomach/Bowel: There appears to be perforation at the mid sigmoid colon, with a large contiguous collection of free fluid and air tracking across the right side of the abdomen and left hemipelvis, measuring 20-30 cm in size. Underlying diffuse mesenteric edema is noted, and scattered additional free air is seen tracking throughout the abdomen and pelvis. Findings are compatible with evolving abscess throughout the abdomen and pelvis. There is diffuse distention of small-bowel loops, concerning for underlying ileus. The stomach is grossly unremarkable in appearance. There is diffuse wall thickening along the ascending and proximal sigmoid colon, likely reflecting underlying infection. Vascular/Lymphatic: The abdominal aorta is unremarkable in appearance. The inferior vena cava is grossly unremarkable. Retroperitoneal nodes measure up to 1.2 cm in short axis. Diffuse retroperitoneal edema is seen. No definite pelvic sidewall lymphadenopathy is appreciated. Reproductive: Irregular enhancing nodules are seen arising at the uterus. These may reflect uterine fibroids, though metastatic disease cannot be excluded. The bladder is mildly distended and grossly unremarkable. Other: No additional soft tissue abnormalities are seen. Musculoskeletal: No acute osseous abnormalities are identified. The visualized musculature is unremarkable in appearance. Review of the MIP images confirms the above findings. IMPRESSION: 1. Perforation at the mid sigmoid colon, with a large contiguous collection of free fluid and air tracking across the right side of the abdomen and left hemipelvis, measuring 20-30 cm in size. Underlying diffuse mesenteric edema, with scattered additional free air  throughout the abdomen and pelvis. Findings compatible with evolving abscess throughout the abdomen and pelvis. 2. Diffuse wall thickening along the ascending and proximal sigmoid colon, likely reflecting underlying infection. 3. Diffuse distention of small-bowel loops likely reflects underlying ileus. 4. No evidence of pulmonary embolus. 5. Scattered small nodules of varying size throughout the lungs, measuring up to 1.1 cm in size. These are suspicious for metastatic disease, given the patient's known metastatic adenocarcinoma. 6. Vague 1.4 cm hypodensity at the left hepatic lobe raises suspicion for metastatic disease. 7. Retroperitoneal nodes measure up to 1.2 cm in short axis. This is nonspecific, though metastatic disease cannot be excluded. Underlying retroperitoneal edema noted. 8. Irregular enhancing nodules arising at the uterus. These may reflect uterine fibroids, though metastatic disease cannot be excluded. 9. No definite breast or pancreatic mass is seen. Given recent surgical pathology results, the primary source of the patient's adenocarcinoma is unclear. Critical Value/emergent results were called by telephone at the time of interpretation on 12/30/2016 at 10:51 pm to Dr. Carrie Mew, who verbally acknowledged these results. Electronically Signed   By: Garald Balding M.D.   On: 12/30/2016 23:05   Ct Abdomen Pelvis W Contrast  Result Date: 12/30/2016 CLINICAL DATA:  Acute onset of generalized weakness and lethargy. Shortness of breath, generalized abdominal pain and diarrhea. Vomiting. Status post recent bilateral salpingo-oophorectomy. Initial encounter. EXAM: CT ANGIOGRAPHY CHEST CT ABDOMEN AND PELVIS WITH CONTRAST TECHNIQUE: Multidetector CT imaging of the chest was performed using the standard protocol during bolus administration of intravenous contrast. Multiplanar CT image reconstructions and MIPs were obtained to evaluate the vascular anatomy. Multidetector CT imaging of the abdomen and  pelvis was performed using the standard protocol during bolus administration of intravenous contrast. CONTRAST:  75 mL of Isovue 370 IV contrast COMPARISON:  None. FINDINGS: CTA CHEST FINDINGS Cardiovascular:  There is no evidence of pulmonary embolus. The heart remains normal in size. The thoracic aorta is unremarkable in appearance. No calcific atherosclerotic disease is seen. The great vessels are unremarkable in appearance. Mediastinum/Nodes: No mediastinal lymphadenopathy is seen. No pericardial effusion is identified. The thyroid gland is unremarkable. No axillary lymphadenopathy is appreciated. Lungs/Pleura: Scattered small nodules of varying size are noted throughout the lungs, measuring up to 1.1 cm in size and suspicious for metastatic disease. Mild bibasilar atelectasis is noted. No pleural effusion or pneumothorax is seen. Musculoskeletal: No acute osseous abnormalities are identified. The visualized musculature is unremarkable in appearance. The breasts are unremarkable in appearance, though the lateral left breast is incompletely characterized. Review of the MIP images confirms the above findings. CT ABDOMEN and PELVIS FINDINGS Hepatobiliary: A vague 1.4 cm hypodensity at the left hepatic lobe raises suspicion for metastatic disease. The liver is otherwise unremarkable. The gallbladder is grossly unremarkable in appearance. The common bile duct remains normal in caliber. Pancreas: The pancreas is grossly unremarkable in appearance. No definite pancreatic mass is identified. Spleen: The spleen is unremarkable in appearance. Adrenals/Urinary Tract: The adrenal glands are grossly unremarkable. The kidneys are within normal limits. There is no evidence of hydronephrosis. No renal or ureteral stones are identified. No perinephric stranding is seen. Stomach/Bowel: There appears to be perforation at the mid sigmoid colon, with a large contiguous collection of free fluid and air tracking across the right side  of the abdomen and left hemipelvis, measuring 20-30 cm in size. Underlying diffuse mesenteric edema is noted, and scattered additional free air is seen tracking throughout the abdomen and pelvis. Findings are compatible with evolving abscess throughout the abdomen and pelvis. There is diffuse distention of small-bowel loops, concerning for underlying ileus. The stomach is grossly unremarkable in appearance. There is diffuse wall thickening along the ascending and proximal sigmoid colon, likely reflecting underlying infection. Vascular/Lymphatic: The abdominal aorta is unremarkable in appearance. The inferior vena cava is grossly unremarkable. Retroperitoneal nodes measure up to 1.2 cm in short axis. Diffuse retroperitoneal edema is seen. No definite pelvic sidewall lymphadenopathy is appreciated. Reproductive: Irregular enhancing nodules are seen arising at the uterus. These may reflect uterine fibroids, though metastatic disease cannot be excluded. The bladder is mildly distended and grossly unremarkable. Other: No additional soft tissue abnormalities are seen. Musculoskeletal: No acute osseous abnormalities are identified. The visualized musculature is unremarkable in appearance. Review of the MIP images confirms the above findings. IMPRESSION: 1. Perforation at the mid sigmoid colon, with a large contiguous collection of free fluid and air tracking across the right side of the abdomen and left hemipelvis, measuring 20-30 cm in size. Underlying diffuse mesenteric edema, with scattered additional free air throughout the abdomen and pelvis. Findings compatible with evolving abscess throughout the abdomen and pelvis. 2. Diffuse wall thickening along the ascending and proximal sigmoid colon, likely reflecting underlying infection. 3. Diffuse distention of small-bowel loops likely reflects underlying ileus. 4. No evidence of pulmonary embolus. 5. Scattered small nodules of varying size throughout the lungs, measuring  up to 1.1 cm in size. These are suspicious for metastatic disease, given the patient's known metastatic adenocarcinoma. 6. Vague 1.4 cm hypodensity at the left hepatic lobe raises suspicion for metastatic disease. 7. Retroperitoneal nodes measure up to 1.2 cm in short axis. This is nonspecific, though metastatic disease cannot be excluded. Underlying retroperitoneal edema noted. 8. Irregular enhancing nodules arising at  the uterus. These may reflect uterine fibroids, though metastatic disease cannot be excluded. 9. No definite breast or pancreatic mass is seen. Given recent surgical pathology results, the primary source of the patient's adenocarcinoma is unclear. Critical Value/emergent results were called by telephone at the time of interpretation on 12/30/2016 at 10:51 pm to Dr. Carrie Mew, who verbally acknowledged these results. Electronically Signed   By: Garald Balding M.D.   On: 12/30/2016 23:05    Assessment/Plan: 51 year old female status post export her laparotomy with sigmoid colectomy and colostomy formation. Doing well. Discussed with critical care, okay to transfer out of the ICU today. Continue IV fluids, NG tube decompression, incentive spirometer usage. Patient will need to get out of bed to chair today. Anticipate significant ileus due to abdominal infections. Appreciate critical care assistance. Continue IV antibiotics.   Clayburn Pert, MD [facility]  [facility] [phone] [facility] [phone]  12/31/2016

## 2016-12-31 NOTE — Progress Notes (Signed)
Report called by Glennie Hawk to assigned nurse taking care of pt scheduled to be moved to room 203.  Pt is alert and oriented.  Fluids running at 150cc/hr.  Scheduled flagyl currently infusing.  Husband at bedside awaiting transport.

## 2016-12-31 NOTE — Anesthesia Preprocedure Evaluation (Addendum)
Anesthesia Evaluation  Patient identified by MRN, date of birth, ID band Patient awake    Reviewed: Allergy & Precautions, NPO status , Patient's Chart, lab work & pertinent test results, reviewed documented beta blocker date and time   History of Anesthesia Complications Negative for: history of anesthetic complications  Airway Mallampati: II  TM Distance: >3 FB     Dental  (+) Chipped, Loose   Pulmonary neg pulmonary ROS,           Cardiovascular Exercise Tolerance: Good negative cardio ROS       Neuro/Psych negative neurological ROS  negative psych ROS   GI/Hepatic Neg liver ROS, hiatal hernia, GERD  ,  Endo/Other  negative endocrine ROS  Renal/GU negative Renal ROS  negative genitourinary   Musculoskeletal   Abdominal   Peds  Hematology negative hematology ROS (+)   Anesthesia Other Findings Past Medical History: No date: Cancer ([place]) No date: Colon polyp No date: Diverticulosis No date: Fibroid uterus No date: HH (hiatus hernia)   Reproductive/Obstetrics negative OB ROS                             Anesthesia Physical  Anesthesia Plan  ASA: II and emergent  Anesthesia Plan: General ETT, Rapid Sequence and Cricoid Pressure   Post-op Pain Management:    Induction: Intravenous  Airway Management Planned: Oral ETT  Additional Equipment:   Intra-op Plan:   Post-operative Plan:   Informed Consent: I have reviewed the patients History and Physical, chart, labs and discussed the procedure including the risks, benefits and alternatives for the proposed anesthesia with the patient or authorized representative who has indicated his/her understanding and acceptance.     Plan Discussed with: CRNA  Anesthesia Plan Comments:        Anesthesia Quick Evaluation

## 2016-12-31 NOTE — Progress Notes (Signed)
Spoke to Floor RN about pending blood culture. She will call MD to verifiy if we will place picc before or after blood culture result.

## 2017-01-01 DIAGNOSIS — C801 Malignant (primary) neoplasm, unspecified: Secondary | ICD-10-CM

## 2017-01-01 DIAGNOSIS — Z86010 Personal history of colonic polyps: Secondary | ICD-10-CM

## 2017-01-01 DIAGNOSIS — K5790 Diverticulosis of intestine, part unspecified, without perforation or abscess without bleeding: Secondary | ICD-10-CM

## 2017-01-01 DIAGNOSIS — K449 Diaphragmatic hernia without obstruction or gangrene: Secondary | ICD-10-CM

## 2017-01-01 DIAGNOSIS — Z7901 Long term (current) use of anticoagulants: Secondary | ICD-10-CM

## 2017-01-01 DIAGNOSIS — Z9049 Acquired absence of other specified parts of digestive tract: Secondary | ICD-10-CM

## 2017-01-01 DIAGNOSIS — Z90722 Acquired absence of ovaries, bilateral: Secondary | ICD-10-CM

## 2017-01-01 DIAGNOSIS — Z931 Gastrostomy status: Secondary | ICD-10-CM

## 2017-01-01 DIAGNOSIS — R109 Unspecified abdominal pain: Secondary | ICD-10-CM

## 2017-01-01 DIAGNOSIS — R1900 Intra-abdominal and pelvic swelling, mass and lump, unspecified site: Secondary | ICD-10-CM

## 2017-01-01 DIAGNOSIS — Z800 Family history of malignant neoplasm of digestive organs: Secondary | ICD-10-CM

## 2017-01-01 DIAGNOSIS — R918 Other nonspecific abnormal finding of lung field: Secondary | ICD-10-CM

## 2017-01-01 DIAGNOSIS — Z79899 Other long term (current) drug therapy: Secondary | ICD-10-CM

## 2017-01-01 LAB — MAGNESIUM: Magnesium: 2.3 mg/dL (ref 1.7–2.4)

## 2017-01-01 LAB — BASIC METABOLIC PANEL
Anion gap: 6 (ref 5–15)
BUN: 13 mg/dL (ref 6–20)
CALCIUM: 7.1 mg/dL — AB (ref 8.9–10.3)
CO2: 31 mmol/L (ref 22–32)
Chloride: 96 mmol/L — ABNORMAL LOW (ref 101–111)
Creatinine, Ser: 0.44 mg/dL (ref 0.44–1.00)
GFR calc Af Amer: >60 mL/min (ref >60)
GFR calc non Af Amer: >60 mL/min (ref >60)
GLUCOSE: 92 mg/dL (ref 65–99)
Potassium: 3.6 mmol/L (ref 3.5–5.1)
Sodium: 133 mmol/L — ABNORMAL LOW (ref 135–145)

## 2017-01-01 LAB — HIV ANTIBODY (ROUTINE TESTING W REFLEX): HIV SCREEN 4TH GENERATION: NONREACTIVE

## 2017-01-01 LAB — CBC
HCT: 22.9 % — ABNORMAL LOW (ref 35.0–47.0)
HEMOGLOBIN: 7.7 g/dL — AB (ref 12.0–16.0)
MCH: 30.5 pg (ref 26.0–34.0)
MCHC: 33.6 g/dL (ref 32.0–36.0)
MCV: 90.8 fL (ref 80.0–100.0)
Platelets: 424 10*3/uL (ref 150–440)
RBC: 2.52 MIL/uL — AB (ref 3.80–5.20)
RDW: 13.8 % (ref 11.5–14.5)
WBC: 23.5 10*3/uL — ABNORMAL HIGH (ref 3.6–11.0)

## 2017-01-01 LAB — PROCALCITONIN: Procalcitonin: 1.34 ng/mL

## 2017-01-01 MED ORDER — METRONIDAZOLE IN NACL 5-0.79 MG/ML-% IV SOLN
500.0000 mg | Freq: Three times a day (TID) | INTRAVENOUS | Status: DC
  Administered 2017-01-02 – 2017-01-10 (×26): 500 mg via INTRAVENOUS
  Filled 2017-01-01 (×28): qty 100

## 2017-01-01 MED ORDER — DEXTROSE IN LACTATED RINGERS 5 % IV SOLN
INTRAVENOUS | Status: DC
  Administered 2017-01-01 – 2017-01-05 (×4): via INTRAVENOUS

## 2017-01-01 NOTE — Consult Note (Signed)
[facility] NOTE  Patient Care Team: Schermerhorn, Gwen Her, MD as PCP - General (Obstetrics and Gynecology)  CHIEF COMPLAINTS/PURPOSE OF CONSULTATION:  High-grade adenocarcinoma  HISTORY OF PRESENTING ILLNESS:  Anna [name] 51 y.o.  female who recently had bilateral salpingo-oophorectomy- complex left adnexal mass- thought likely to be of benign origin [ pre-op CA 125-Normal]. Unfortunately at surgery the left adnexal mass was closely attached to the descending colon; the mass had to be dissected off the colon. Interestingly patient noted to have peritoneal nodules. Pathology- interestingly showed high grade adenocarcinoma- immunohistochemistry not suggestive of ovarian/fallopian; but of pancreas/breast.    Patient was admitted to the hospital yesterday for worsening abdominal pain feeling poorly- noted to have a sigmoid colon perforation/pelvic abscess. She is currently status post colectomy; status post colostomy.    Today's patient's postoperative day 1; overall seems to be improving. No fever no chills. Complaints of pain at the site of the surgery. Has NG tube in place.  ROS: A complete 10 point review of system is done which is negative except mentioned above in history of present illness  MEDICAL HISTORY:  Past Medical History:  Diagnosis Date  . Cancer ([place])   . Colon polyp   . Diverticulosis   . Fibroid uterus   . HH (hiatus hernia)     SURGICAL HISTORY: Past Surgical History:  Procedure Laterality Date  . COLONOSCOPY    . CYSTOSCOPY  12/20/2016   Procedure: CYSTOSCOPY;  Surgeon: Boykin Nearing, MD;  Location: [facility];  Service: Gynecology;;  . ESOPHAGOGASTRODUODENOSCOPY    . LAPAROSCOPIC BILATERAL SALPINGECTOMY Bilateral 12/20/2016   Procedure: LAPAROSCOPIC BILATERAL SALPINGOOPHERECTOMY,  Excision of anterior 5 cm calcified uterine fibroid;  Surgeon: Boykin Nearing, MD;  Location: [facility];  Service: Gynecology;   Laterality: Bilateral;  . LAPAROSCOPIC LYSIS OF ADHESIONS  12/20/2016   Procedure: LAPAROSCOPIC LYSIS OF ADHESIONS WITH DISSECTION OF LEFT TUBE AND OVARY.;  Surgeon: Olean Ree, MD;  Location: [facility];  Service: General;;  . LAPAROTOMY N/A 12/31/2016   Procedure: EXPLORATORY LAPAROTOMY, sigmoid colectomy, removal of omentum, colostomy;  Surgeon: Jules Husbands, MD;  Location: [facility];  Service: General;  Laterality: N/A;  . SIGMOIDOSCOPY  12/20/2016   Procedure: RIGID SIGMOIDOSCOPY;  Surgeon: Olean Ree, MD;  Location: [facility];  Service: General;;  . TONSILLECTOMY      SOCIAL HISTORY:No smoking or alcohol. Patient works in [employer]. She lives with her husband in [place]. No children.  Social History   Social History  . Marital status: Married    Spouse name: N/A  . Number of children: N/A  . Years of education: N/A   Occupational History  . Not on file.   Social History Main Topics  . Smoking status: Never Smoker  . Smokeless tobacco: Never Used  . Alcohol use No  . Drug use: No  . Sexual activity: Not on file   Other Topics Concern  . Not on file   Social History Narrative  . No narrative on file    FAMILY HISTORY: Family History  Problem Relation Age of Onset  . Colon cancer Mother 83  . Diabetes Mother   . Asthma Father   . Breast cancer Neg Hx     ALLERGIES:  is allergic to penicillins.  MEDICATIONS:  Current Facility-Administered Medications  Medication Dose Route Frequency Provider Last Rate Last Dose  . ciprofloxacin (CIPRO) IVPB 400 mg  400 mg Intravenous Q12H Pabon, Marjory Lies, MD  Stopped at 01/01/17 4098  . diphenhydrAMINE (BENADRYL) 12.5 MG/5ML elixir 12.5 mg  12.5 mg Oral Q6H PRN Pabon, Diego F, MD       Or  . diphenhydrAMINE (BENADRYL) injection 12.5 mg  12.5 mg Intravenous Q6H PRN Pabon, Diego F, MD      . enoxaparin (LOVENOX) injection 40 mg  40 mg Subcutaneous Q24H Pabon, Diego F, MD   40 mg at 01/01/17 0839  . famotidine (PEPCID) IVPB 20  mg premix  20 mg Intravenous Q12H Schermerhorn, Gwen Her, MD   Stopped at 01/01/17 1120  . ketorolac (TORADOL) 30 MG/ML injection 30 mg  30 mg Intravenous Q6H Pabon, Diego F, MD   30 mg at 01/01/17 1200  . lactated ringers infusion   Intravenous Continuous Pabon, [place] F, MD 150 mL/hr at 01/01/17 1200    . levofloxacin (LEVAQUIN) IVPB 750 mg  750 mg Intravenous Q24H Carrie Mew, MD   Stopped at 12/31/16 2306  . metroNIDAZOLE (FLAGYL) IVPB 500 mg  500 mg Intravenous Q8H Carrie Mew, MD   Stopped at 01/01/17 1153  . morphine 4 MG/ML injection 4 mg  4 mg Intravenous Q2H PRN Caroleen Hamman F, MD   4 mg at 12/31/16 0859  . ondansetron (ZOFRAN) tablet 4 mg  4 mg Oral Q6H PRN Schermerhorn, Gwen Her, MD       Or  . ondansetron [facility]) injection 4 mg  4 mg Intravenous Q6H PRN Schermerhorn, Gwen Her, MD      . ondansetron (ZOFRAN-ODT) disintegrating tablet 4 mg  4 mg Oral Q6H PRN Pabon, Diego F, MD       Or  . ondansetron (ZOFRAN) injection 4 mg  4 mg Intravenous Q6H PRN Pabon, Diego F, MD          .  PHYSICAL EXAMINATION:  Vitals:   12/31/16 2116 01/01/17 0516  BP: 111/63 (!) 120/57  Pulse: (!) 109 (!) 114  Resp: 20 20  Temp: 98.4 F (36.9 C) 98.5 F (36.9 C)   Filed Weights   12/30/16 1801 12/31/16 0644 01/01/17 0401  Weight: 134 lb (60.8 kg) 143 lb 11.8 oz (65.2 kg) 137 lb (62.1 kg)    GENERAL: Well-nourished well-developed; Alert, no distress and comfortable.   Alone. NG tube in place. EYES: no pallor or icterus OROPHARYNX: no thrush or ulceration. NECK: supple, no masses felt LYMPH:  no palpable lymphadenopathy in the cervical, axillary or inguinal regions LUNGS: decreased breath sounds to auscultation at bases and  No wheeze or crackles HEART/CVS: regular rate & rhythm and no murmurs; No lower extremity edema ABDOMEN: abdomen soft, non-tender and colostomy in place.  Musculoskeletal:no cyanosis of digits and no clubbing  PSYCH: alert & oriented x 3 with fluent  speech NEURO: no focal motor/sensory deficits SKIN:  no rashes or significant lesions  LABORATORY DATA:  I have reviewed the data as listed Lab Results  Component Value Date   WBC 23.5 (H) 01/01/2017   HGB 7.7 (L) 01/01/2017   HCT 22.9 (L) 01/01/2017   MCV 90.8 01/01/2017   PLT 424 01/01/2017    Recent Labs  12/21/16 0151 12/30/16 1803 12/31/16 0627 12/31/16 1804 01/01/17 0609  NA 137 128* 130*  --  133*  K 4.4 3.4* 3.2* 3.9 3.6  CL 100* 81* 93*  --  96*  CO2 30 32 27  --  31  GLUCOSE 93 110* 102*  --  92  BUN 7 21* 16  --  13  CREATININE 0.65 0.72 0.62  --  0.44  CALCIUM 8.7* 8.4* 7.3*  --  7.1*  GFRNONAA >60 >60 >60  --  >60  GFRAA >60 >60 >60  --  >60  PROT 6.3*  --  4.5*  --   --   ALBUMIN 2.9*  --  1.7*  --   --   AST 26  --  43*  --   --   ALT 21  --  30  --   --   ALKPHOS 72  --  100  --   --   BILITOT 0.5  --  3.0*  --   --     RADIOGRAPHIC STUDIES: I have personally reviewed the radiological images as listed and agreed with the findings in the report. Ct Angio Chest Pe W Or Wo Contrast  Result Date: 12/30/2016 CLINICAL DATA:  Acute onset of generalized weakness and lethargy. Shortness of breath, generalized abdominal pain and diarrhea. Vomiting. Status post recent bilateral salpingo-oophorectomy. Initial encounter. EXAM: CT ANGIOGRAPHY CHEST CT ABDOMEN AND PELVIS WITH CONTRAST TECHNIQUE: Multidetector CT imaging of the chest was performed using the standard protocol during bolus administration of intravenous contrast. Multiplanar CT image reconstructions and MIPs were obtained to evaluate the vascular anatomy. Multidetector CT imaging of the abdomen and pelvis was performed using the standard protocol during bolus administration of intravenous contrast. CONTRAST:  75 mL of Isovue 370 IV contrast COMPARISON:  None. FINDINGS: CTA CHEST FINDINGS Cardiovascular:  There is no evidence of pulmonary embolus. The heart remains normal in size. The thoracic aorta is  unremarkable in appearance. No calcific atherosclerotic disease is seen. The great vessels are unremarkable in appearance. Mediastinum/Nodes: No mediastinal lymphadenopathy is seen. No pericardial effusion is identified. The thyroid gland is unremarkable. No axillary lymphadenopathy is appreciated. Lungs/Pleura: Scattered small nodules of varying size are noted throughout the lungs, measuring up to 1.1 cm in size and suspicious for metastatic disease. Mild bibasilar atelectasis is noted. No pleural effusion or pneumothorax is seen. Musculoskeletal: No acute osseous abnormalities are identified. The visualized musculature is unremarkable in appearance. The breasts are unremarkable in appearance, though the lateral left breast is incompletely characterized. Review of the MIP images confirms the above findings. CT ABDOMEN and PELVIS FINDINGS Hepatobiliary: A vague 1.4 cm hypodensity at the left hepatic lobe raises suspicion for metastatic disease. The liver is otherwise unremarkable. The gallbladder is grossly unremarkable in appearance. The common bile duct remains normal in caliber. Pancreas: The pancreas is grossly unremarkable in appearance. No definite pancreatic mass is identified. Spleen: The spleen is unremarkable in appearance. Adrenals/Urinary Tract: The adrenal glands are grossly unremarkable. The kidneys are within normal limits. There is no evidence of hydronephrosis. No renal or ureteral stones are identified. No perinephric stranding is seen. Stomach/Bowel: There appears to be perforation at the mid sigmoid colon, with a large contiguous collection of free fluid and air tracking across the right side of the abdomen and left hemipelvis, measuring 20-30 cm in size. Underlying diffuse mesenteric edema is noted, and scattered additional free air is seen tracking throughout the abdomen and pelvis. Findings are compatible with evolving abscess throughout the abdomen and pelvis. There is diffuse distention of  small-bowel loops, concerning for underlying ileus. The stomach is grossly unremarkable in appearance. There is diffuse wall thickening along the ascending and proximal sigmoid colon, likely reflecting underlying infection. Vascular/Lymphatic: The abdominal aorta is unremarkable in appearance. The inferior vena cava is grossly unremarkable. Retroperitoneal nodes measure up to 1.2 cm in short axis. Diffuse retroperitoneal edema is seen.  No definite pelvic sidewall lymphadenopathy is appreciated. Reproductive: Irregular enhancing nodules are seen arising at the uterus. These may reflect uterine fibroids, though metastatic disease cannot be excluded. The bladder is mildly distended and grossly unremarkable. Other: No additional soft tissue abnormalities are seen. Musculoskeletal: No acute osseous abnormalities are identified. The visualized musculature is unremarkable in appearance. Review of the MIP images confirms the above findings. IMPRESSION: 1. Perforation at the mid sigmoid colon, with a large contiguous collection of free fluid and air tracking across the right side of the abdomen and left hemipelvis, measuring 20-30 cm in size. Underlying diffuse mesenteric edema, with scattered additional free air throughout the abdomen and pelvis. Findings compatible with evolving abscess throughout the abdomen and pelvis. 2. Diffuse wall thickening along the ascending and proximal sigmoid colon, likely reflecting underlying infection. 3. Diffuse distention of small-bowel loops likely reflects underlying ileus. 4. No evidence of pulmonary embolus. 5. Scattered small nodules of varying size throughout the lungs, measuring up to 1.1 cm in size. These are suspicious for metastatic disease, given the patient's known metastatic adenocarcinoma. 6. Vague 1.4 cm hypodensity at the left hepatic lobe raises suspicion for metastatic disease. 7. Retroperitoneal nodes measure up to 1.2 cm in short axis. This is nonspecific, though  metastatic disease cannot be excluded. Underlying retroperitoneal edema noted. 8. Irregular enhancing nodules arising at the uterus. These may reflect uterine fibroids, though metastatic disease cannot be excluded. 9. No definite breast or pancreatic mass is seen. Given recent surgical pathology results, the primary source of the patient's adenocarcinoma is unclear. Critical Value/emergent results were called by telephone at the time of interpretation on 12/30/2016 at 10:51 pm to Dr. Carrie Mew, who verbally acknowledged these results. Electronically Signed   By: Garald Balding M.D.   On: 12/30/2016 23:05   Ct Abdomen Pelvis W Contrast  Result Date: 12/30/2016 CLINICAL DATA:  Acute onset of generalized weakness and lethargy. Shortness of breath, generalized abdominal pain and diarrhea. Vomiting. Status post recent bilateral salpingo-oophorectomy. Initial encounter. EXAM: CT ANGIOGRAPHY CHEST CT ABDOMEN AND PELVIS WITH CONTRAST TECHNIQUE: Multidetector CT imaging of the chest was performed using the standard protocol during bolus administration of intravenous contrast. Multiplanar CT image reconstructions and MIPs were obtained to evaluate the vascular anatomy. Multidetector CT imaging of the abdomen and pelvis was performed using the standard protocol during bolus administration of intravenous contrast. CONTRAST:  75 mL of Isovue 370 IV contrast COMPARISON:  None. FINDINGS: CTA CHEST FINDINGS Cardiovascular:  There is no evidence of pulmonary embolus. The heart remains normal in size. The thoracic aorta is unremarkable in appearance. No calcific atherosclerotic disease is seen. The great vessels are unremarkable in appearance. Mediastinum/Nodes: No mediastinal lymphadenopathy is seen. No pericardial effusion is identified. The thyroid gland is unremarkable. No axillary lymphadenopathy is appreciated. Lungs/Pleura: Scattered small nodules of varying size are noted throughout the lungs, measuring up to 1.1 cm  in size and suspicious for metastatic disease. Mild bibasilar atelectasis is noted. No pleural effusion or pneumothorax is seen. Musculoskeletal: No acute osseous abnormalities are identified. The visualized musculature is unremarkable in appearance. The breasts are unremarkable in appearance, though the lateral left breast is incompletely characterized. Review of the MIP images confirms the above findings. CT ABDOMEN and PELVIS FINDINGS Hepatobiliary: A vague 1.4 cm hypodensity at the left hepatic lobe raises suspicion for metastatic disease. The liver is otherwise unremarkable. The gallbladder is grossly unremarkable in appearance. The common bile duct remains normal in caliber. Pancreas: The pancreas is grossly unremarkable in  appearance. No definite pancreatic mass is identified. Spleen: The spleen is unremarkable in appearance. Adrenals/Urinary Tract: The adrenal glands are grossly unremarkable. The kidneys are within normal limits. There is no evidence of hydronephrosis. No renal or ureteral stones are identified. No perinephric stranding is seen. Stomach/Bowel: There appears to be perforation at the mid sigmoid colon, with a large contiguous collection of free fluid and air tracking across the right side of the abdomen and left hemipelvis, measuring 20-30 cm in size. Underlying diffuse mesenteric edema is noted, and scattered additional free air is seen tracking throughout the abdomen and pelvis. Findings are compatible with evolving abscess throughout the abdomen and pelvis. There is diffuse distention of small-bowel loops, concerning for underlying ileus. The stomach is grossly unremarkable in appearance. There is diffuse wall thickening along the ascending and proximal sigmoid colon, likely reflecting underlying infection. Vascular/Lymphatic: The abdominal aorta is unremarkable in appearance. The inferior vena cava is grossly unremarkable. Retroperitoneal nodes measure up to 1.2 cm in short axis. Diffuse  retroperitoneal edema is seen. No definite pelvic sidewall lymphadenopathy is appreciated. Reproductive: Irregular enhancing nodules are seen arising at the uterus. These may reflect uterine fibroids, though metastatic disease cannot be excluded. The bladder is mildly distended and grossly unremarkable. Other: No additional soft tissue abnormalities are seen. Musculoskeletal: No acute osseous abnormalities are identified. The visualized musculature is unremarkable in appearance. Review of the MIP images confirms the above findings. IMPRESSION: 1. Perforation at the mid sigmoid colon, with a large contiguous collection of free fluid and air tracking across the right side of the abdomen and left hemipelvis, measuring 20-30 cm in size. Underlying diffuse mesenteric edema, with scattered additional free air throughout the abdomen and pelvis. Findings compatible with evolving abscess throughout the abdomen and pelvis. 2. Diffuse wall thickening along the ascending and proximal sigmoid colon, likely reflecting underlying infection. 3. Diffuse distention of small-bowel loops likely reflects underlying ileus. 4. No evidence of pulmonary embolus. 5. Scattered small nodules of varying size throughout the lungs, measuring up to 1.1 cm in size. These are suspicious for metastatic disease, given the patient's known metastatic adenocarcinoma. 6. Vague 1.4 cm hypodensity at the left hepatic lobe raises suspicion for metastatic disease. 7. Retroperitoneal nodes measure up to 1.2 cm in short axis. This is nonspecific, though metastatic disease cannot be excluded. Underlying retroperitoneal edema noted. 8. Irregular enhancing nodules arising at the uterus. These may reflect uterine fibroids, though metastatic disease cannot be excluded. 9. No definite breast or pancreatic mass is seen. Given recent surgical pathology results, the primary source of the patient's adenocarcinoma is unclear. Critical Value/emergent results were called by  telephone at the time of interpretation on 12/30/2016 at 10:51 pm to Dr. Carrie Mew, who verbally acknowledged these results. Electronically Signed   By: Garald Balding M.D.   On: 12/30/2016 23:05    ASSESSMENT & PLAN:   # 51 year old female patient status post recent bilateral salpingo-oophorectomy; currently admitted to the hospital for sigmoid bowel perforation.  # High grade adenocarcinoma; CT imaging- bilateral lung nodules;liver met- no definite pancreatic mass. s/p BSO- however, IHC- s/o metastatic disease rather than primary ovarian/ fallopian origin. Question primary breast or pancreatic based on IHC. Patient will need further pathologic work up [facility]; [facility]]. Will order Tumor markers. Discussed with Dr.Berchuck- recommend follow up in GynOnc clinic after discharge for endometrial biopsy. Patient will need outpatient PET scan/mammogram.   # Sigmoid colon perforation- status post colectomy with colostomy. Followed by surgery.   #  We'll continue to follow the patient in the hospital. The above plan of care was discussed with patient in detail.  # Thank you Dr. Ouida Sills for allowing me to participate in the care of your pleasant patient. Please do not hesitate to contact me with questions or concerns in the interim. Also spoke to [place].   All questions were answered. The patient knows to call the clinic with any problems, questions or concerns.    Cammie Sickle, MD 01/01/2017 1:39 PM

## 2017-01-01 NOTE — Progress Notes (Signed)
1 Day Post-Op   Subjective:  Patient reports no acute events overnight. States her abdomen is sore but improving. Denies any nausea. Has not yet been out of bed.  Vital signs in last 24 hours: Temp:  [98.4 F (36.9 C)-98.6 F (37 C)] 98.5 F (36.9 C) (05/09 0516) Pulse Rate:  [109-124] 114 (05/09 0516) Resp:  [13-24] 20 (05/09 0516) BP: (111-132)/(57-81) 120/57 (05/09 0516) SpO2:  [87 %-97 %] 95 % (05/09 0516) Weight:  [62.1 kg (137 lb)] 62.1 kg (137 lb) (05/09 0401) Last BM Date: 12/30/16  Intake/Output from previous day: 05/08 0701 - 05/09 0700 In: 4737.5 [I.V.:3487.5; IV Piggyback:950] Out: 1230 [Urine:900; Emesis/NG output:50; Drains:280]  GI: Ostomy present left lower quadrant without any gas or stool yet. Midline dressing is in place and is clean, dry, intact. No evidence of purulence or spreading erythema.  Lab Results:  CBC  Recent Labs  12/31/16 0627 01/01/17 0609  WBC 33.2* 23.5*  HGB 10.5* 7.7*  HCT 31.1* 22.9*  PLT 580* 424   CMP     Component Value Date/Time   NA 133 (L) 01/01/2017 0609   K 3.6 01/01/2017 0609   CL 96 (L) 01/01/2017 0609   CO2 31 01/01/2017 0609   GLUCOSE 92 01/01/2017 0609   BUN 13 01/01/2017 0609   CREATININE 0.44 01/01/2017 0609   CALCIUM 7.1 (L) 01/01/2017 0609   PROT 4.5 (L) 12/31/2016 0627   ALBUMIN 1.7 (L) 12/31/2016 0627   AST 43 (H) 12/31/2016 0627   ALT 30 12/31/2016 0627   ALKPHOS 100 12/31/2016 0627   BILITOT 3.0 (H) 12/31/2016 0627   GFRNONAA >60 01/01/2017 0609   GFRAA >60 01/01/2017 0609   PT/INR No results for input(s): LABPROT, INR in the last 72 hours.  Studies/Results: Ct Angio Chest Pe W Or Wo Contrast  Result Date: 12/30/2016 CLINICAL DATA:  Acute onset of generalized weakness and lethargy. Shortness of breath, generalized abdominal pain and diarrhea. Vomiting. Status post recent bilateral salpingo-oophorectomy. Initial encounter. EXAM: CT ANGIOGRAPHY CHEST CT ABDOMEN AND PELVIS WITH CONTRAST TECHNIQUE:  Multidetector CT imaging of the chest was performed using the standard protocol during bolus administration of intravenous contrast. Multiplanar CT image reconstructions and MIPs were obtained to evaluate the vascular anatomy. Multidetector CT imaging of the abdomen and pelvis was performed using the standard protocol during bolus administration of intravenous contrast. CONTRAST:  75 mL of Isovue 370 IV contrast COMPARISON:  None. FINDINGS: CTA CHEST FINDINGS Cardiovascular:  There is no evidence of pulmonary embolus. The heart remains normal in size. The thoracic aorta is unremarkable in appearance. No calcific atherosclerotic disease is seen. The great vessels are unremarkable in appearance. Mediastinum/Nodes: No mediastinal lymphadenopathy is seen. No pericardial effusion is identified. The thyroid gland is unremarkable. No axillary lymphadenopathy is appreciated. Lungs/Pleura: Scattered small nodules of varying size are noted throughout the lungs, measuring up to 1.1 cm in size and suspicious for metastatic disease. Mild bibasilar atelectasis is noted. No pleural effusion or pneumothorax is seen. Musculoskeletal: No acute osseous abnormalities are identified. The visualized musculature is unremarkable in appearance. The breasts are unremarkable in appearance, though the lateral left breast is incompletely characterized. Review of the MIP images confirms the above findings. CT ABDOMEN and PELVIS FINDINGS Hepatobiliary: A vague 1.4 cm hypodensity at the left hepatic lobe raises suspicion for metastatic disease. The liver is otherwise unremarkable. The gallbladder is grossly unremarkable in appearance. The common bile duct remains normal in caliber. Pancreas: The pancreas is grossly unremarkable in appearance. No  definite pancreatic mass is identified. Spleen: The spleen is unremarkable in appearance. Adrenals/Urinary Tract: The adrenal glands are grossly unremarkable. The kidneys are within normal limits. There is  no evidence of hydronephrosis. No renal or ureteral stones are identified. No perinephric stranding is seen. Stomach/Bowel: There appears to be perforation at the mid sigmoid colon, with a large contiguous collection of free fluid and air tracking across the right side of the abdomen and left hemipelvis, measuring 20-30 cm in size. Underlying diffuse mesenteric edema is noted, and scattered additional free air is seen tracking throughout the abdomen and pelvis. Findings are compatible with evolving abscess throughout the abdomen and pelvis. There is diffuse distention of small-bowel loops, concerning for underlying ileus. The stomach is grossly unremarkable in appearance. There is diffuse wall thickening along the ascending and proximal sigmoid colon, likely reflecting underlying infection. Vascular/Lymphatic: The abdominal aorta is unremarkable in appearance. The inferior vena cava is grossly unremarkable. Retroperitoneal nodes measure up to 1.2 cm in short axis. Diffuse retroperitoneal edema is seen. No definite pelvic sidewall lymphadenopathy is appreciated. Reproductive: Irregular enhancing nodules are seen arising at the uterus. These may reflect uterine fibroids, though metastatic disease cannot be excluded. The bladder is mildly distended and grossly unremarkable. Other: No additional soft tissue abnormalities are seen. Musculoskeletal: No acute osseous abnormalities are identified. The visualized musculature is unremarkable in appearance. Review of the MIP images confirms the above findings. IMPRESSION: 1. Perforation at the mid sigmoid colon, with a large contiguous collection of free fluid and air tracking across the right side of the abdomen and left hemipelvis, measuring 20-30 cm in size. Underlying diffuse mesenteric edema, with scattered additional free air throughout the abdomen and pelvis. Findings compatible with evolving abscess throughout the abdomen and pelvis. 2. Diffuse wall thickening along  the ascending and proximal sigmoid colon, likely reflecting underlying infection. 3. Diffuse distention of small-bowel loops likely reflects underlying ileus. 4. No evidence of pulmonary embolus. 5. Scattered small nodules of varying size throughout the lungs, measuring up to 1.1 cm in size. These are suspicious for metastatic disease, given the patient's known metastatic adenocarcinoma. 6. Vague 1.4 cm hypodensity at the left hepatic lobe raises suspicion for metastatic disease. 7. Retroperitoneal nodes measure up to 1.2 cm in short axis. This is nonspecific, though metastatic disease cannot be excluded. Underlying retroperitoneal edema noted. 8. Irregular enhancing nodules arising at the uterus. These may reflect uterine fibroids, though metastatic disease cannot be excluded. 9. No definite breast or pancreatic mass is seen. Given recent surgical pathology results, the primary source of the patient's adenocarcinoma is unclear. Critical Value/emergent results were called by telephone at the time of interpretation on 12/30/2016 at 10:51 pm to Dr. Carrie Mew, who verbally acknowledged these results. Electronically Signed   By: Garald Balding M.D.   On: 12/30/2016 23:05   Ct Abdomen Pelvis W Contrast  Result Date: 12/30/2016 CLINICAL DATA:  Acute onset of generalized weakness and lethargy. Shortness of breath, generalized abdominal pain and diarrhea. Vomiting. Status post recent bilateral salpingo-oophorectomy. Initial encounter. EXAM: CT ANGIOGRAPHY CHEST CT ABDOMEN AND PELVIS WITH CONTRAST TECHNIQUE: Multidetector CT imaging of the chest was performed using the standard protocol during bolus administration of intravenous contrast. Multiplanar CT image reconstructions and MIPs were obtained to evaluate the vascular anatomy. Multidetector CT imaging of the abdomen and pelvis was performed using the standard protocol during bolus administration of intravenous contrast. CONTRAST:  75 mL of Isovue 370 IV contrast  COMPARISON:  None. FINDINGS: CTA CHEST FINDINGS Cardiovascular:  There is no evidence of pulmonary embolus. The heart remains normal in size. The thoracic aorta is unremarkable in appearance. No calcific atherosclerotic disease is seen. The great vessels are unremarkable in appearance. Mediastinum/Nodes: No mediastinal lymphadenopathy is seen. No pericardial effusion is identified. The thyroid gland is unremarkable. No axillary lymphadenopathy is appreciated. Lungs/Pleura: Scattered small nodules of varying size are noted throughout the lungs, measuring up to 1.1 cm in size and suspicious for metastatic disease. Mild bibasilar atelectasis is noted. No pleural effusion or pneumothorax is seen. Musculoskeletal: No acute osseous abnormalities are identified. The visualized musculature is unremarkable in appearance. The breasts are unremarkable in appearance, though the lateral left breast is incompletely characterized. Review of the MIP images confirms the above findings. CT ABDOMEN and PELVIS FINDINGS Hepatobiliary: A vague 1.4 cm hypodensity at the left hepatic lobe raises suspicion for metastatic disease. The liver is otherwise unremarkable. The gallbladder is grossly unremarkable in appearance. The common bile duct remains normal in caliber. Pancreas: The pancreas is grossly unremarkable in appearance. No definite pancreatic mass is identified. Spleen: The spleen is unremarkable in appearance. Adrenals/Urinary Tract: The adrenal glands are grossly unremarkable. The kidneys are within normal limits. There is no evidence of hydronephrosis. No renal or ureteral stones are identified. No perinephric stranding is seen. Stomach/Bowel: There appears to be perforation at the mid sigmoid colon, with a large contiguous collection of free fluid and air tracking across the right side of the abdomen and left hemipelvis, measuring 20-30 cm in size. Underlying diffuse mesenteric edema is noted, and scattered additional free air  is seen tracking throughout the abdomen and pelvis. Findings are compatible with evolving abscess throughout the abdomen and pelvis. There is diffuse distention of small-bowel loops, concerning for underlying ileus. The stomach is grossly unremarkable in appearance. There is diffuse wall thickening along the ascending and proximal sigmoid colon, likely reflecting underlying infection. Vascular/Lymphatic: The abdominal aorta is unremarkable in appearance. The inferior vena cava is grossly unremarkable. Retroperitoneal nodes measure up to 1.2 cm in short axis. Diffuse retroperitoneal edema is seen. No definite pelvic sidewall lymphadenopathy is appreciated. Reproductive: Irregular enhancing nodules are seen arising at the uterus. These may reflect uterine fibroids, though metastatic disease cannot be excluded. The bladder is mildly distended and grossly unremarkable. Other: No additional soft tissue abnormalities are seen. Musculoskeletal: No acute osseous abnormalities are identified. The visualized musculature is unremarkable in appearance. Review of the MIP images confirms the above findings. IMPRESSION: 1. Perforation at the mid sigmoid colon, with a large contiguous collection of free fluid and air tracking across the right side of the abdomen and left hemipelvis, measuring 20-30 cm in size. Underlying diffuse mesenteric edema, with scattered additional free air throughout the abdomen and pelvis. Findings compatible with evolving abscess throughout the abdomen and pelvis. 2. Diffuse wall thickening along the ascending and proximal sigmoid colon, likely reflecting underlying infection. 3. Diffuse distention of small-bowel loops likely reflects underlying ileus. 4. No evidence of pulmonary embolus. 5. Scattered small nodules of varying size throughout the lungs, measuring up to 1.1 cm in size. These are suspicious for metastatic disease, given the patient's known metastatic adenocarcinoma. 6. Vague 1.4 cm  hypodensity at the left hepatic lobe raises suspicion for metastatic disease. 7. Retroperitoneal nodes measure up to 1.2 cm in short axis. This is nonspecific, though metastatic disease cannot be excluded. Underlying retroperitoneal edema noted. 8. Irregular enhancing nodules arising at the uterus. These may reflect uterine fibroids, though metastatic disease cannot be excluded. 9. No definite  breast or pancreatic mass is seen. Given recent surgical pathology results, the primary source of the patient's adenocarcinoma is unclear. Critical Value/emergent results were called by telephone at the time of interpretation on 12/30/2016 at 10:51 pm to Dr. Carrie Mew, who verbally acknowledged these results. Electronically Signed   By: Garald Balding M.D.   On: 12/30/2016 23:05    Assessment/Plan: 51 year old female status post export her laparotomy with Hartmann procedure and drainage of pelvic abscesses. Doing very well. Due to low NG tube output discussed the patient that we will remove her NG tube today. Discussed that should she have any recurrence of distention or nausea another tube might be required to be placed. She voiced understanding. She had discussed PICC line and TPN with Dr. Dahlia Byes last night, however given that the NG tube feeding remove this morning we will hold off on that until she declares whether not she had some ileus. Encourage patient to get out of bed. Foley catheter be removed later today by her nurse if she is able to get out of bed. Encourage incentive spirometer usage.   Clayburn Pert, MD [facility]  Day [facility] [phone] Night [facility] [phone]  01/01/2017

## 2017-01-01 NOTE — Care Management (Signed)
Patient post op day 1 1. lysis of adhesions 2. Hartmann's procedure 3. Omentectomy  4. Drainage of multiple interloop abscesses 5. Placement blake drains x 2  Patient lives at home with husband.  WOC placed.  Wetzel Bjornstad to meet with husband and patient tomorrow for pouch change.  RNCM spoke with Dwanye at [employer].  Pe Dwanye home health services can be ordered now, and faxed to [facility].  At that time it will go to review, be determined if services will be covered, and [facility] will set up home health services.  RNCM following  .

## 2017-01-01 NOTE — Plan of Care (Signed)
Problem: Activity: Goal: Ability to tolerate increased activity will improve Outcome: Progressing Patient has ambulated in hallway multiple times as well as been up to chair most of day.  Tolerated well.

## 2017-01-01 NOTE — Progress Notes (Signed)
Initial Nutrition Assessment  DOCUMENTATION CODES:   Not applicable  INTERVENTION:  Agree with plan to hold off on TPN at this time as patient is not yet malnourished and NGT may be discontinued today.  Will continue to monitor plan of care and recommend appropriate nutrition intervention. Once diet able to be advanced patient would benefit from an oral nutrition supplement to prevent further unintentional weight loss.  NUTRITION DIAGNOSIS:   Inadequate oral intake related to inability to eat as evidenced by NPO status.  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 Patient presented with increased abdominal pain, N/V, diarrhea found to have large abscess and perforation in sigmoid colon and signs of sepsis.   -Patient s/p exploratory laparotomy with lysis of adhesions, Harmann's procedure, omentectomy, drainage of multiple interloop abscesses, placement of blake drains x 2, creation of colostomy on 5/8. -Per chart plan initially was for placement of PICC line today for initiation of TPN in setting of expected ileus. -Per chart today plan is to remove NGT due to low output and will hold off on PICC line for TPN unless she does develop ileus. Order for PICC line now discontinued.  Spoke with patient at bedside. She reports her appetite has been poor since before her first operation on 4/27, but got worse directly before current admission. She reports that for a few days after discharge on 4/28 she was able to eat small meals, but then  for at least the past wek she has only been able to tolerate foods such as gelatin and apple sauce. Her husband also purchased her some Ensure she has been sipping, but not regularly. She reports she has had reflux, loose stools, and two episodes of emesis after drinking water. Previously patient would have a small snack in the morning at work, then would eat a full lunch and dinner.  Patient reports UBW in "140s" range. Per chart patient was 145 lbs on 07/08/2016, 141 lbs on 11/21/2016, 136 lbs on 12/11/2016. She has lost approximately 9 lbs (6.2% body weight) over approximately 4 months, which is not significant for time frame. Even the weight loss of 5 lbs (3.5 % body weight) from 3/29 to 4/18 is not significant for time frame.  Medications reviewed and include: ciprofloxacin, famotidine, LR @ 150 ml/hr.  Labs reviewed: Sodium 133, Chloride 96. Calcium 7.3 on 5/8 (corrected to 9.14 with Albumin level of 1.7).   I/O: 50 ml output in NGT past 24 hrs.  Nutrition-Focused physical exam completed. Findings are mild-moderate fat depletion in upper arm region, mild-moderate muscle depletion in temple region, and no edema.   Patient is at risk for malnutrition, but does not meet the criteria at this time.  Discussed with RN.   Diet Order:  Diet NPO time specified Except for: Ice Chips  Skin:  Wound (see comment) (multiple closed surgical incisions)  Last BM:  12/30/2016  Height:   Ht Readings from Last 1 Encounters:  12/31/16 5\' 3"  (1.6 m)    Weight:   Wt Readings from Last 1 Encounters:  01/01/17 137 lb (62.1 kg)    Ideal Body Weight:  52.3 kg  BMI:  Body mass index is 24.27 kg/m.  Estimated Nutritional Needs:   Kcal:  1565-1805 (MSJ x 1.3-1.5)  Protein:  75-95 grams (1.2-1.5 grams/kg)  Fluid:  1.8-2.1 L/day (30-35 ml/kg)  EDUCATION NEEDS:   No education needs identified at this time  Willey Blade, MS, RD, LDN Pager: [phone] After Hours Pager: [phone]

## 2017-01-01 NOTE — Progress Notes (Signed)
Patient ID: Anna [name], female   DOB: Dec 13, 1965, 51 y.o.   MRN: [id number] Pt doing better today  Afebrile and decreasing WBC . NGT output improving .  Spoke with oncology today who was going to talk with dr Fransisca Connors , gyn onc to formulate a plan for identification and treatment .  Cont to follow

## 2017-01-01 NOTE — Progress Notes (Signed)
Pharmacy Consult for Electrolyte Monitoring and Replacement Indication: hypokalemia  Allergies  Allergen Reactions  . Penicillins Rash    As a child. Has patient had a PCN reaction causing immediate rash, facial/tongue/throat swelling, SOB or lightheadedness with hypotension: Unknown Has patient had a PCN reaction causing severe rash involving mucus membranes or skin necrosis: Unknown Has patient had a PCN reaction that required hospitalization: No Has patient had a PCN reaction occurring within the last 10 years: No If all of the above answers are "NO", then may proceed with Cephalosporin use.     Patient Measurements: Height: 5\' 3"  (160 cm) Weight: 137 lb (62.1 kg) IBW/kg (Calculated) : 52.4  Vital Signs: Temp: 98.5 F (36.9 C) (05/09 0516) Temp Source: Oral (05/09 0516) BP: 120/57 (05/09 0516) Pulse Rate: 114 (05/09 0516) Intake/Output from previous day: 05/08 0701 - 05/09 0700 In: 4737.5 [I.V.:3487.5; IV Piggyback:950] Out: 1230 [Urine:900; Emesis/NG output:50; Drains:280] Intake/Output from this shift: No intake/output data recorded.  Labs:  Recent Labs  12/30/16 1803 12/31/16 0627 01/01/17 0609  WBC 41.0* 33.2* 23.5*  HGB 11.9* 10.5* 7.7*  HCT 34.7* 31.1* 22.9*  PLT 635* 580* 424  CREATININE 0.72 0.62 0.44  MG  --  1.7 2.3  PHOS  --  2.9  --   ALBUMIN  --  1.7*  --   PROT  --  4.5*  --   AST  --  43*  --   ALT  --  30  --   ALKPHOS  --  100  --   BILITOT  --  3.0*  --    Estimated Creatinine Clearance: 68 mL/min (by C-G formula based on SCr of 0.44 mg/dL).   Potassium (mmol/L)  Date Value  01/01/2017 3.6   Sodium (mmol/L)  Date Value  01/01/2017 133 (L)   Calcium (mg/dL)  Date Value  01/01/2017 7.1 (L)   CCa= 9.1   Medical History: Past Medical History:  Diagnosis Date  . Cancer ([place])   . Colon polyp   . Diverticulosis   . Fibroid uterus   . HH (hiatus hernia)     Assessment: 51 y/o F s/p ex lap for perforated sigmoid colon.     Plan:  Potassium chloride 40 meq po once and magnesium sulfate 2 g iv once. Will recheck K at 1800.   5/8 1804 K WNL. Recheck with AM labs. -NAC  5/9 0609 K 3.6, Mg 2.3, Ca 7.1, adjusted Ca 8.9. No further supplement warranted at this time. Will recheck electrolytes tomorrow with AM labs.   Laural Benes, Pharm.D., BCPS Clinical Pharmacist 01/01/2017,11:41 AM

## 2017-01-01 NOTE — Consult Note (Signed)
[place] Nurse ostomy consult note Stoma type/location: LLQ Colostomy Stomal assessment/size: 1 3/8" visualized through pouch.  Pink and moist  edematous Peristomal assessment: not assessed Treatment options for stomal/peristomal skin: none today Output blood tinged liquid in pouch only at this time.  Ostomy pouching: 2pc. 2 1/4" pouch  Education provided: Arranged to meet with patient and spouse tomorrow at mid day for pouch change and teaching session. Left written materials and pouches.  Discussed emptying and encouraged to attempt this if oputput begins in pouch.  Enrolled patient in [facility] program: No WOC team will follow.   Domenic Moras RN BSN [place] Pager [phone]

## 2017-01-02 ENCOUNTER — Encounter: Payer: Self-pay | Admitting: *Deleted

## 2017-01-02 LAB — BASIC METABOLIC PANEL
Anion gap: 7 (ref 5–15)
BUN: 10 mg/dL (ref 6–20)
CO2: 28 mmol/L (ref 22–32)
CREATININE: 0.5 mg/dL (ref 0.44–1.00)
Calcium: 7 mg/dL — ABNORMAL LOW (ref 8.9–10.3)
Chloride: 99 mmol/L — ABNORMAL LOW (ref 101–111)
GFR calc Af Amer: >60 mL/min (ref >60)
GFR calc non Af Amer: >60 mL/min (ref >60)
GLUCOSE: 118 mg/dL — AB (ref 65–99)
POTASSIUM: 3.5 mmol/L (ref 3.5–5.1)
SODIUM: 134 mmol/L — AB (ref 135–145)

## 2017-01-02 LAB — CBC
HCT: 21.9 % — ABNORMAL LOW (ref 35.0–47.0)
Hemoglobin: 7.4 g/dL — ABNORMAL LOW (ref 12.0–16.0)
MCH: 29.9 pg (ref 26.0–34.0)
MCHC: 33.6 g/dL (ref 32.0–36.0)
MCV: 89 fL (ref 80.0–100.0)
PLATELETS: 403 10*3/uL (ref 150–440)
RBC: 2.46 MIL/uL — AB (ref 3.80–5.20)
RDW: 14.1 % (ref 11.5–14.5)
WBC: 22.6 10*3/uL — ABNORMAL HIGH (ref 3.6–11.0)

## 2017-01-02 LAB — CANCER ANTIGEN 27.29: CA 27.29: 10.9 U/mL (ref 0.0–38.6)

## 2017-01-02 LAB — CEA: CEA: 1 ng/mL (ref 0.0–4.7)

## 2017-01-02 LAB — CANCER ANTIGEN 19-9: CA 19 9: 3 U/mL (ref 0–35)

## 2017-01-02 LAB — PROCALCITONIN: Procalcitonin: 0.62 ng/mL

## 2017-01-02 MED ORDER — BOOST / RESOURCE BREEZE PO LIQD
1.0000 | Freq: Three times a day (TID) | ORAL | Status: DC
  Administered 2017-01-02 – 2017-01-06 (×5): 1 via ORAL

## 2017-01-02 MED ORDER — PRO-STAT SUGAR FREE PO LIQD
30.0000 mL | Freq: Three times a day (TID) | ORAL | Status: DC
  Administered 2017-01-02 – 2017-01-10 (×19): 30 mL via ORAL

## 2017-01-02 NOTE — Care Management (Signed)
Faxed home health orders, WOC consult, Surgeon notes to [facility].

## 2017-01-02 NOTE — Progress Notes (Addendum)
Nutrition Follow Up Note  DOCUMENTATION CODES:   Not applicable  INTERVENTION:   Boost Breeze po TID, each supplement provides 250 kcal and 9 grams of protein  Prostat liquid protein PO 30 ml TID with meals, each supplement provides 100 kcal, 15 grams protein.  NUTRITION DIAGNOSIS:   Inadequate oral intake related to inability to eat as evidenced by NPO status. Pt advanced to clear liquid diet.  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 Patient presented with increased abdominal pain, N/V, diarrhea found to have large abscess and perforation in sigmoid colon and signs of sepsis.   -Patient s/p exploratory laparotomy with lysis of adhesions, Harmann's procedure, omentectomy, drainage of multiple interloop abscesses, placement of blake drains x 2, creation of colostomy on 5/8.  Met with pt in room today. Pt reports that she is feeling better today and starting to walk around some. Pt still does not have a great appetite but reports that it is slowly returning. Pt advanced to clear liquid diet today but has not ordered a meal yet. Pt had NGT pulled 5/9. Still no output in ostomy bag. Pt starting to develop edema in BLE. RD discussed with pt the importance of adequate protein intake and improved nutritional status. Pt is willing to try supplements; RD will order Boost Breeze and Prostat. Per chart, pt with 9lbs weight gain since admit likely r/t edema and inflammation. Continue to encourage intake of meals and supplements. Advance diet as tolerated.   Medications reviewed and  include: lovenox, ciprofloxacin, famotidine, metronidazole  Labs reviewed: Na 134(L), Cl 99(L). Ca 7.0(L), P 2.9 wnl-5/8, Mg 2.3 wnl- 5/9 Wbc- 22.6(H), Hgb 7.4(L), Hct 21.9(L)  Diet Order:  Diet clear liquid Room service appropriate? Yes; Fluid consistency: Thin  Skin:  Wound (see comment) (multiple closed surgical incisions)  Last BM:  5/7 (colostomy )  Height:   Ht Readings from Last 1 Encounters:  12/31/16 5' 3"  (1.6 m)    Weight:   Wt Readings from Last 1 Encounters:  01/02/17 143 lb (64.9 kg)    Ideal Body Weight:  52.3 kg  BMI:  Body mass index is 25.33 kg/m.  Estimated Nutritional Needs:   Kcal:  1565-1805 (MSJ x 1.3-1.5)  Protein:  75-95 grams (1.2-1.5 grams/kg)  Fluid:  1.8-2.1 L/day (30-35 ml/kg)  EDUCATION NEEDS:   No education needs identified at this time  Koleen Distance MS, RD, LDN Pager #- [phone]

## 2017-01-02 NOTE — Progress Notes (Signed)
Anna [name]   DOB:September 13, 1965   NH#:[id number]    Subjective: Sleeping. Denies significant pain. Colostomy work well. No nausea no vomiting. Denies any significant shortness of breath or cough. No fevers or chills.  Objective:  Vitals:   01/02/17 0404 01/02/17 1326  BP: 122/65 135/63  Pulse: (!) 108 (!) 106  Resp: 16 20  Temp: 98.6 F (37 C) 98.4 F (36.9 C)     Intake/Output Summary (Last 24 hours) at 01/02/17 1338 Last data filed at 01/02/17 0730  Gross per 24 hour  Intake             2816 ml  Output             1170 ml  Net             1646 ml     GENERAL: Well-nourished well-developed; Alert, no distress and comfortable.   Alone. EYES: no pallor or icterus OROPHARYNX: no thrush or ulceration. NECK: supple, no masses felt LYMPH:  no palpable lymphadenopathy in the cervical, axillary or inguinal regions LUNGS: decreased breath sounds to auscultation at bases and  No wheeze or crackles HEART/CVS: regular rate & rhythm and no murmurs; No lower extremity edema ABDOMEN: abdomen soft, non-tender and colostomy in place.  Musculoskeletal:no cyanosis of digits and no clubbing  PSYCH: alert & oriented x 3 with fluent speech NEURO: no focal motor/sensory deficits SKIN:  no rashes or significant lesions  Labs:  Lab Results  Component Value Date   WBC 22.6 (H) 01/02/2017   HGB 7.4 (L) 01/02/2017   HCT 21.9 (L) 01/02/2017   MCV 89.0 01/02/2017   PLT 403 01/02/2017   NEUTROABS 31.8 (H) 12/31/2016    Lab Results  Component Value Date   NA 134 (L) 01/02/2017   K 3.5 01/02/2017   CL 99 (L) 01/02/2017   CO2 28 01/02/2017    Studies:  No results found.  Assessment & Plan:   # 51 year old female patient status post recent bilateral salpingo-oophorectomy; currently admitted to the hospital for sigmoid bowel perforation.  # High grade adenocarcinoma; CT imaging- bilateral lung nodules;liver met. Based on IHC not suggestive of ovarian/fallopian origin. Ordered cancer  type ID; [facility] 1. Awaiting on the tumor markers. Discussed with Dr.Berchuck- recommend follow up in GynOnc clinic after discharge for endometrial biopsy. Patient will need outpatient PET scan/mammogram.   # Sigmoid colon perforation- status post colectomy with colostomy. Based on preliminary pathology- concern for involvement by tumor. Awaiting final results. Discussed at the tumor conference today.  Cammie Sickle, MD 01/02/2017  1:38 PM

## 2017-01-02 NOTE — Consult Note (Signed)
[place] Nurse ostomy consult note Stoma type/location: LLQ colostomy  Midline abdominal wound, LLQ JP drain in place.  Pouch change with spouse at bedside.  Stomal assessment/size: 1 3/8" pink and moist stoma, edematous.  Peristomal assessment: intact.  Stoma is located in a skin fold at 3 and 9 o'clock.  Will add barrier ring to promote secure fit.  Treatment options for stomal/peristomal skin: barrier ring Output bloody liquid in pouch only.  Ostomy pouching: 1pc./2pc.  Education provided: Pouch change performed with spouse and patient observing.  Discussed showering, frequency of pouch change, emptying and peristomal skin care.  Enrolled patient in [place] program: Yes today [place] team will follow Domenic Moras RN BSN [facility] Pager [phone]

## 2017-01-02 NOTE — Progress Notes (Signed)
2 Days Post-Op   Subjective:  Patient reports continuing to do well. Has tolerated the removal of her NG tube without any nausea or vomiting. Her pain has been well controlled. She has been out of bed numerous times.  Vital signs in last 24 hours: Temp:  [98.4 F (36.9 C)-100 F (37.8 C)] 98.6 F (37 C) (05/10 0404) Pulse Rate:  [108-113] 108 (05/10 0404) Resp:  [16-20] 16 (05/10 0404) BP: (120-131)/(60-66) 122/65 (05/10 0404) SpO2:  [93 %-97 %] 93 % (05/10 0404) Weight:  [64.9 kg (143 lb)] 64.9 kg (143 lb) (05/10 0500) Last BM Date: 12/30/16  Intake/Output from previous day: 05/09 0701 - 05/10 0700 In: 3429 [P.O.:315; I.V.:2572; IV Piggyback:542] Out: 1320 [Urine:1250; Drains:70]  GI: Abdomen is soft, nondistended, appropriately tender to palpation. Midline wound with dressing in place. Deeper tissue is healthy appearing fat without any signs of infection or purulence. Ostomy present in the left lower quadrant with liquid in the ostomy bag and minimal gas but no stool. Bilateral JP drains with serosanguineous output.  Lab Results:  CBC  Recent Labs  01/01/17 0609 01/02/17 0537  WBC 23.5* 22.6*  HGB 7.7* 7.4*  HCT 22.9* 21.9*  PLT 424 403   CMP     Component Value Date/Time   NA 134 (L) 01/02/2017 0537   K 3.5 01/02/2017 0537   CL 99 (L) 01/02/2017 0537   CO2 28 01/02/2017 0537   GLUCOSE 118 (H) 01/02/2017 0537   BUN 10 01/02/2017 0537   CREATININE 0.50 01/02/2017 0537   CALCIUM 7.0 (L) 01/02/2017 0537   PROT 4.5 (L) 12/31/2016 0627   ALBUMIN 1.7 (L) 12/31/2016 0627   AST 43 (H) 12/31/2016 0627   ALT 30 12/31/2016 0627   ALKPHOS 100 12/31/2016 0627   BILITOT 3.0 (H) 12/31/2016 0627   GFRNONAA >60 01/02/2017 0537   GFRAA >60 01/02/2017 0537   PT/INR No results for input(s): LABPROT, INR in the last 72 hours.  Studies/Results: No results found.  Assessment/Plan: 51 year old female status post sigmoid colectomy with Hartman's colostomy. Drainage of pelvic  abscess. Doing well. Plan to start clear liquid diet today. Encourage ambulation, incentive spirometer usage. Continue IV antibiotics for intra-abdominal infection. Appreciate pharmacy assistance with antibiotic management.   Clayburn Pert, MD [facility]  Day [facility] [phone] Night [facility] [phone]  01/02/2017

## 2017-01-02 NOTE — Care Management (Addendum)
Spoke with Dr Adonis Huguenin regarding need for home health orders so can initiate auth with [facility].  he is unable to give estimated discharge date at present.  Still not sure if a wound vac will be needed.

## 2017-01-02 NOTE — Progress Notes (Signed)
Seen nad examined Doing great AVSS Good UO No complaints Taking Clears Ambulating  PE NAD Abd: dressing intact. Serous drainage. No peritonitis. Ostomy pink/ patent  A/p Doing well Decrease IVF Path d/w pt in detail, pending official final report

## 2017-01-02 NOTE — Progress Notes (Signed)
POD#2  Continued clinical improvement NGT out  Onc + gyn onc input on chart  I spoke with pt and family today . I will be out of town 'til [place], but I will continue with progress remotely .   Laverta Baltimore MD

## 2017-01-03 LAB — SURGICAL PATHOLOGY

## 2017-01-03 LAB — CBC
HCT: 22.7 % — ABNORMAL LOW (ref 35.0–47.0)
Hemoglobin: 7.8 g/dL — ABNORMAL LOW (ref 12.0–16.0)
MCH: 30.8 pg (ref 26.0–34.0)
MCHC: 34.2 g/dL (ref 32.0–36.0)
MCV: 90.1 fL (ref 80.0–100.0)
Platelets: 572 10*3/uL — ABNORMAL HIGH (ref 150–440)
RBC: 2.52 MIL/uL — ABNORMAL LOW (ref 3.80–5.20)
RDW: 14 % (ref 11.5–14.5)
WBC: 17.9 10*3/uL — ABNORMAL HIGH (ref 3.6–11.0)

## 2017-01-03 LAB — BASIC METABOLIC PANEL
Anion gap: 5 (ref 5–15)
BUN: 7 mg/dL (ref 6–20)
CHLORIDE: 100 mmol/L — AB (ref 101–111)
CO2: 31 mmol/L (ref 22–32)
CREATININE: 0.44 mg/dL (ref 0.44–1.00)
Calcium: 7.1 mg/dL — ABNORMAL LOW (ref 8.9–10.3)
Glucose, Bld: 126 mg/dL — ABNORMAL HIGH (ref 65–99)
POTASSIUM: 3 mmol/L — AB (ref 3.5–5.1)
SODIUM: 136 mmol/L (ref 135–145)

## 2017-01-03 LAB — CANCER ANTIGEN 15-3: CAN 15 3: 14.8 U/mL (ref 0.0–25.0)

## 2017-01-03 MED ORDER — POTASSIUM CHLORIDE CRYS ER 20 MEQ PO TBCR
40.0000 meq | EXTENDED_RELEASE_TABLET | Freq: Once | ORAL | Status: AC
  Administered 2017-01-03: 40 meq via ORAL
  Filled 2017-01-03: qty 2

## 2017-01-03 MED ORDER — ALUM & MAG HYDROXIDE-SIMETH 200-200-20 MG/5ML PO SUSP
30.0000 mL | Freq: Four times a day (QID) | ORAL | Status: DC | PRN
  Administered 2017-01-03: 30 mL via ORAL
  Filled 2017-01-03: qty 30

## 2017-01-03 NOTE — Progress Notes (Signed)
3 Days Post-Op   Subjective:  Patient continues to do well. She has tolerated her clear liquid diet. She has been ambulate eating. Pain has been well controlled.  Vital signs in last 24 hours: Temp:  [98 F (36.7 C)-98.4 F (36.9 C)] 98 F (36.7 C) (05/11 0404) Pulse Rate:  [106-115] 107 (05/11 0404) Resp:  [18-20] 18 (05/11 0404) BP: (126-135)/(63-71) 126/64 (05/11 0404) SpO2:  [94 %-98 %] 94 % (05/11 0404) Weight:  [67.9 kg (149 lb 9.6 oz)] 67.9 kg (149 lb 9.6 oz) (05/11 0406) Last BM Date: 12/30/16  Intake/Output from previous day: 05/10 0701 - 05/11 0700 In: 2670 [P.O.:120; I.V.:2000; IV Piggyback:550] Out: 2868 [Urine:2850; Drains:18]  GI: Abdomen is soft, appropriately tender to palpation, nondistended. Midline dressing is clean, dry, intact. Ostomy is pink, patent and present in the left lower quadrant with gas within the bag but no stool yet on my exam. JP drains bilaterally with serous and was output.  Lab Results:  CBC  Recent Labs  01/02/17 0537 01/03/17 0543  WBC 22.6* 17.9*  HGB 7.4* 7.8*  HCT 21.9* 22.7*  PLT 403 572*   CMP     Component Value Date/Time   NA 136 01/03/2017 0543   K 3.0 (L) 01/03/2017 0543   CL 100 (L) 01/03/2017 0543   CO2 31 01/03/2017 0543   GLUCOSE 126 (H) 01/03/2017 0543   BUN 7 01/03/2017 0543   CREATININE 0.44 01/03/2017 0543   CALCIUM 7.1 (L) 01/03/2017 0543   PROT 4.5 (L) 12/31/2016 0627   ALBUMIN 1.7 (L) 12/31/2016 0627   AST 43 (H) 12/31/2016 0627   ALT 30 12/31/2016 0627   ALKPHOS 100 12/31/2016 0627   BILITOT 3.0 (H) 12/31/2016 0627   GFRNONAA >60 01/03/2017 0543   GFRAA >60 01/03/2017 0543   PT/INR No results for input(s): LABPROT, INR in the last 72 hours.  Studies/Results: No results found.  Assessment/Plan: 51 year old female status post export her laparotomy with creation of an end colostomy. Patient has been tolerating her clear liquid diet. Possible advancement of diet later today if she continues to show  bowel function. Discussed with the nursing staff that we would attempt to place a wound VAC to the abdomen at the bedside today. Plan to remove 1 of the 2 JP drains at that time. Encourage ambulation, incentive spirometer usage. Continue IV antibiotics until white count has normalized.   Clayburn Pert, MD [facility]  Day [facility] [phone] Night [facility] [phone]  01/03/2017

## 2017-01-03 NOTE — Plan of Care (Signed)
Problem: Bowel/Gastric: Goal: Will not experience complications related to bowel motility Outcome: Progressing Colostomy passing flatus. Tolerating Clear liquid diet.

## 2017-01-03 NOTE — Plan of Care (Signed)
Problem: Bowel/Gastric: Goal: Will not experience complications related to bowel motility Outcome: Progressing Pt has had stool output from ostomy today.

## 2017-01-03 NOTE — Plan of Care (Signed)
Problem: Nutrition: Goal: Ability to attain and maintain optimal nutritional status will improve Outcome: Progressing Pt is tolerating prostat

## 2017-01-03 NOTE — Consult Note (Signed)
[place] Nurse ostomy follow up Stoma type/location: LLQ Colostomy with JP drains in place.  Surgical services to place NPWT [facility]) therapy.  Discharge plans tentatively for [facility] with [facility] when medically ready.  Spouse at bedside for teachign today.  Stomal assessment/size: 1 3/8" pink and moist  Small amount soft brown stool in pouch.   Peristomal assessment: intact.  Border trimmed to accomodate NPWT dressing.   Treatment options for stomal/peristomal skin: Barrier ring due to skin folds at 3 and 9 o'clock Output soft brown stool Ostomy pouching: 2pc. 2 1/4' pouch with barrier ring. Made "discharge bag" of 4 pouching systems with barrier ring in drawer of patient room in case of discharge.  Education provided: Pouch change performed with spouse assisting.   Reinforced showering, purpose of barrier ring and need to trim barrier due to NPWT dressing.  Enrolled patient in [place] Start Discharge program: Yes   [place] team will follow.  Domenic Moras RN BSN [place] Pager [phone]

## 2017-01-03 NOTE — Care Management (Signed)
Notified by Stanton Kidney from [facility] that patient has been approved for home health services at discharge.  Corene Cornea with [facility] notified.  Plan to place wound vac.

## 2017-01-04 LAB — CBC
HEMATOCRIT: 25 % — AB (ref 35.0–47.0)
HEMOGLOBIN: 8.4 g/dL — AB (ref 12.0–16.0)
MCH: 30.3 pg (ref 26.0–34.0)
MCHC: 33.6 g/dL (ref 32.0–36.0)
MCV: 90.2 fL (ref 80.0–100.0)
Platelets: 685 10*3/uL — ABNORMAL HIGH (ref 150–440)
RBC: 2.77 MIL/uL — AB (ref 3.80–5.20)
RDW: 14 % (ref 11.5–14.5)
WBC: 17.7 10*3/uL — ABNORMAL HIGH (ref 3.6–11.0)

## 2017-01-04 LAB — MAGNESIUM: MAGNESIUM: 1.7 mg/dL (ref 1.7–2.4)

## 2017-01-04 LAB — BASIC METABOLIC PANEL
Anion gap: 8 (ref 5–15)
BUN: 8 mg/dL (ref 6–20)
CHLORIDE: 98 mmol/L — AB (ref 101–111)
CO2: 33 mmol/L — AB (ref 22–32)
CREATININE: 0.57 mg/dL (ref 0.44–1.00)
Calcium: 7.4 mg/dL — ABNORMAL LOW (ref 8.9–10.3)
GFR calc non Af Amer: >60 mL/min (ref >60)
Glucose, Bld: 104 mg/dL — ABNORMAL HIGH (ref 65–99)
POTASSIUM: 3.2 mmol/L — AB (ref 3.5–5.1)
SODIUM: 139 mmol/L (ref 135–145)

## 2017-01-04 LAB — AEROBIC CULTURE  (SUPERFICIAL SPECIMEN)

## 2017-01-04 LAB — CULTURE, BLOOD (ROUTINE X 2)
CULTURE: NO GROWTH
CULTURE: NO GROWTH
SPECIAL REQUESTS: ADEQUATE
Special Requests: ADEQUATE

## 2017-01-04 LAB — AEROBIC CULTURE W GRAM STAIN (SUPERFICIAL SPECIMEN)

## 2017-01-04 MED ORDER — OXYCODONE HCL 5 MG PO TABS
5.0000 mg | ORAL_TABLET | ORAL | Status: DC | PRN
  Administered 2017-01-05: 5 mg via ORAL
  Administered 2017-01-06 – 2017-01-08 (×3): 10 mg via ORAL
  Filled 2017-01-04: qty 1
  Filled 2017-01-04 (×3): qty 2

## 2017-01-04 MED ORDER — MORPHINE SULFATE (PF) 2 MG/ML IV SOLN
2.0000 mg | INTRAVENOUS | Status: DC | PRN
  Administered 2017-01-10: 2 mg via INTRAVENOUS
  Filled 2017-01-04: qty 1

## 2017-01-04 MED ORDER — ACETAMINOPHEN 500 MG PO TABS: 1000.0000 mg | ORAL_TABLET | Freq: Four times a day (QID) | ORAL | Status: DC | PRN

## 2017-01-04 MED ORDER — POTASSIUM CHLORIDE CRYS ER 20 MEQ PO TBCR
40.0000 meq | EXTENDED_RELEASE_TABLET | Freq: Once | ORAL | Status: AC
Start: 2017-01-04 — End: 2017-01-04
  Administered 2017-01-04: 40 meq via ORAL
  Filled 2017-01-04: qty 2

## 2017-01-04 MED ORDER — PANTOPRAZOLE SODIUM 40 MG PO TBEC
40.0000 mg | DELAYED_RELEASE_TABLET | Freq: Every day | ORAL | Status: DC
  Administered 2017-01-06 – 2017-01-10 (×5): 40 mg via ORAL
  Filled 2017-01-04 (×5): qty 1

## 2017-01-04 MED ORDER — MAGNESIUM SULFATE 2 GM/50ML IV SOLN
2.0000 g | Freq: Once | INTRAVENOUS | Status: AC
  Administered 2017-01-04: 2 g via INTRAVENOUS
  Filled 2017-01-04: qty 50

## 2017-01-04 NOTE — Progress Notes (Signed)
4 Days Post-Op   Subjective:  Patient reports that she had an uneventful light. Wound VAC was placed by my partner Dr.Piscoya. She has been tolerating a diet and having ostomy function. She denies any active complaints. Her pain is currently well controlled.  Vital signs in last 24 hours: Temp:  [98.6 F (37 C)-99.8 F (37.7 C)] 99.8 F (37.7 C) (05/12 0437) Pulse Rate:  [103-116] 103 (05/12 0437) Resp:  [17-18] 18 (05/12 0437) BP: (130-148)/(77-82) 148/82 (05/12 0437) SpO2:  [96 %-98 %] 96 % (05/12 0437) Weight:  [66.4 kg (146 lb 4.8 oz)] 66.4 kg (146 lb 4.8 oz) (05/12 0444) Last BM Date: 01/03/17  Intake/Output from previous day: 05/11 0701 - 05/12 0700 In: 4403 [P.O.:1060; I.V.:267; IV Piggyback:150] Out: 4660 [Urine:4500; Drains:30; Stool:130]  GI: Abdomen is soft, minimally tender abdomen nondistended. Midline wound with wound VAC in place with a good seal. Left lower quadrant ostomy in place that is pink, patent, productive of gas and stool. Bilateral JP drains with serosanguineous fluid.  Lab Results:  CBC  Recent Labs  01/03/17 0543 01/04/17 0305  WBC 17.9* 17.7*  HGB 7.8* 8.4*  HCT 22.7* 25.0*  PLT 572* 685*   CMP     Component Value Date/Time   NA 139 01/04/2017 0305   K 3.2 (L) 01/04/2017 0305   CL 98 (L) 01/04/2017 0305   CO2 33 (H) 01/04/2017 0305   GLUCOSE 104 (H) 01/04/2017 0305   BUN 8 01/04/2017 0305   CREATININE 0.57 01/04/2017 0305   CALCIUM 7.4 (L) 01/04/2017 0305   PROT 4.5 (L) 12/31/2016 0627   ALBUMIN 1.7 (L) 12/31/2016 0627   AST 43 (H) 12/31/2016 0627   ALT 30 12/31/2016 0627   ALKPHOS 100 12/31/2016 0627   BILITOT 3.0 (H) 12/31/2016 0627   GFRNONAA >60 01/04/2017 0305   GFRAA >60 01/04/2017 0305   PT/INR No results for input(s): LABPROT, INR in the last 72 hours.  Studies/Results: No results found.  Assessment/Plan: 51 year old female status post sigmoid colectomy with Hartman's colostomy. Doing well. Continues to have mildly  elevated leukocytosis. Discussed with the patient should remain elevated tomorrow that we repeat a CT scan as she is a high risk for intra-abdominal abscess. Given the low volume and serous and was nature of her JP drains the left lower quadrant JP drain was removed today at bedside without difficulty as it was the lower output. Encourage ambulation, incentive spirometer usage. Continue IV antibiotics due to leukocytosis. Potassium being replaced today.   Clayburn Pert, MD [facility]  Day [facility] [phone] Night [facility] [phone]  01/04/2017

## 2017-01-05 ENCOUNTER — Inpatient Hospital Stay: Payer: Managed Care, Other (non HMO)

## 2017-01-05 ENCOUNTER — Encounter: Payer: Self-pay | Admitting: Radiology

## 2017-01-05 LAB — BASIC METABOLIC PANEL
ANION GAP: 5 (ref 5–15)
BUN: 9 mg/dL (ref 6–20)
CALCIUM: 7.2 mg/dL — AB (ref 8.9–10.3)
CHLORIDE: 100 mmol/L — AB (ref 101–111)
CO2: 30 mmol/L (ref 22–32)
Creatinine, Ser: 0.46 mg/dL (ref 0.44–1.00)
GFR calc non Af Amer: >60 mL/min (ref >60)
Glucose, Bld: 106 mg/dL — ABNORMAL HIGH (ref 65–99)
POTASSIUM: 3.4 mmol/L — AB (ref 3.5–5.1)
Sodium: 135 mmol/L (ref 135–145)

## 2017-01-05 LAB — CBC
HEMATOCRIT: 23 % — AB (ref 35.0–47.0)
HEMOGLOBIN: 7.8 g/dL — AB (ref 12.0–16.0)
MCH: 30.9 pg (ref 26.0–34.0)
MCHC: 33.9 g/dL (ref 32.0–36.0)
MCV: 91.1 fL (ref 80.0–100.0)
Platelets: 774 10*3/uL — ABNORMAL HIGH (ref 150–440)
RBC: 2.53 MIL/uL — AB (ref 3.80–5.20)
RDW: 14 % (ref 11.5–14.5)
WBC: 21 10*3/uL — ABNORMAL HIGH (ref 3.6–11.0)

## 2017-01-05 LAB — ANAEROBIC CULTURE

## 2017-01-05 MED ORDER — FENTANYL CITRATE (PF) 100 MCG/2ML IJ SOLN
INTRAMUSCULAR | Status: AC | PRN
  Administered 2017-01-05 (×2): 50 ug via INTRAVENOUS

## 2017-01-05 MED ORDER — MIDAZOLAM HCL 2 MG/2ML IJ SOLN
INTRAMUSCULAR | Status: AC | PRN
  Administered 2017-01-05 (×2): 1 mg via INTRAVENOUS

## 2017-01-05 MED ORDER — POTASSIUM CHLORIDE CRYS ER 20 MEQ PO TBCR: 40.0000 meq | EXTENDED_RELEASE_TABLET | Freq: Once | ORAL | Status: DC

## 2017-01-05 MED ORDER — IOPAMIDOL (ISOVUE-300) INJECTION 61%
100.0000 mL | Freq: Once | INTRAVENOUS | Status: AC | PRN
  Administered 2017-01-05: 100 mL via INTRAVENOUS

## 2017-01-05 MED ORDER — MIDAZOLAM HCL 2 MG/2ML IJ SOLN
INTRAMUSCULAR | Status: AC
  Filled 2017-01-05: qty 4

## 2017-01-05 MED ORDER — FENTANYL CITRATE (PF) 100 MCG/2ML IJ SOLN
INTRAMUSCULAR | Status: AC
  Filled 2017-01-05: qty 2

## 2017-01-05 MED ORDER — IOPAMIDOL (ISOVUE-300) INJECTION 61%
15.0000 mL | INTRAVENOUS | Status: AC
  Administered 2017-01-05 (×2): 15 mL via ORAL

## 2017-01-05 NOTE — Sedation Documentation (Signed)
Report called to Abby, will finish recovery in inpt room

## 2017-01-05 NOTE — Consult Note (Signed)
Chief Complaint: Patient was seen in consultation today for  Chief Complaint  Patient presents with  . Shortness of Breath  . Weakness   at the request of Dr. Clayburn Pert  Referring Physician(s): Clayburn Pert, MD   Patient Status: [facility] - In-pt  History of Present Illness: Anna [name] is a 51 y.o. female who presented on 12/31/16 with perforation of the sigmoid colon and multiple intraperitoneal abscesses.  She underwent ex-lap with sigmoid resection, LLQ colostomy and Hartman's pouch creation.  Additional clearing of abscesses and lysis of adhesions.    Has done well post-op and is relatively asymptomatic but WBC has been trending upward.  CT scan today demonstrates a peripherally enhancing fluid collection in the RLQ concerning for abscess.      Past Medical History:  Diagnosis Date  . Cancer ([place])   . Cancer with unknown primary site [facility])   . Colon polyp   . Diverticulosis   . Fibroid uterus   . HH (hiatus hernia)     Past Surgical History:  Procedure Laterality Date  . COLONOSCOPY    . CYSTOSCOPY  12/20/2016   Procedure: CYSTOSCOPY;  Surgeon: Boykin Nearing, MD;  Location: [facility] ORS;  Service: Gynecology;;  . ESOPHAGOGASTRODUODENOSCOPY    . LAPAROSCOPIC BILATERAL SALPINGECTOMY Bilateral 12/20/2016   Procedure: LAPAROSCOPIC BILATERAL SALPINGOOPHERECTOMY,  Excision of anterior 5 cm calcified uterine fibroid;  Surgeon: Boykin Nearing, MD;  Location: [facility] ORS;  Service: Gynecology;  Laterality: Bilateral;  . LAPAROSCOPIC LYSIS OF ADHESIONS  12/20/2016   Procedure: LAPAROSCOPIC LYSIS OF ADHESIONS WITH DISSECTION OF LEFT TUBE AND OVARY.;  Surgeon: Olean Ree, MD;  Location: [facility] ORS;  Service: General;;  . LAPAROTOMY N/A 12/31/2016   Procedure: EXPLORATORY LAPAROTOMY, sigmoid colectomy, removal of omentum, colostomy;  Surgeon: Jules Husbands, MD;  Location: [facility] ORS;  Service: General;  Laterality: N/A;  . SIGMOIDOSCOPY  12/20/2016   Procedure:  RIGID SIGMOIDOSCOPY;  Surgeon: Olean Ree, MD;  Location: [facility] ORS;  Service: General;;  . TONSILLECTOMY      Allergies: Penicillins  Medications: Prior to Admission medications   Medication Sig Start Date End Date Taking? Authorizing Provider  acetaminophen (TYLENOL) 500 MG tablet Take 500 mg by mouth as needed for mild pain.   Yes [provider]  estradiol (VIVELLE-DOT) 0.025 MG/24HR Place 1 patch onto the skin 2 (two) times a week.   Yes [provider]  HYDROcodone-acetaminophen (NORCO/VICODIN) 5-325 MG tablet Take 1 tablet by mouth every 6 (six) hours as needed for moderate pain.   Yes [provider]  naproxen (NAPROSYN) 500 MG tablet Take 500 mg by mouth 2 (two) times daily with a meal.   Yes [provider]     Family History  Problem Relation Age of Onset  . Colon cancer Mother 82  . Diabetes Mother   . Asthma Father   . Breast cancer Neg Hx     Social History   Social History  . Marital status: Married    Spouse name: N/A  . Number of children: N/A  . Years of education: N/A   Social History Main Topics  . Smoking status: Never Smoker  . Smokeless tobacco: Never Used  . Alcohol use No  . Drug use: No  . Sexual activity: Not Asked   Other Topics Concern  . None   Social History Narrative  . None    Review of Systems: A 12 point ROS discussed and pertinent positives are indicated in the  HPI above.  All other systems are negative.  Review of Systems  Vital Signs: BP 140/80 (BP Location: Right Arm)   Pulse (!) 109   Temp 97.9 F (36.6 C)   Resp 18   Ht 5\' 3"  (1.6 m)   Wt 149 lb 4.8 oz (67.7 kg)   LMP 03/01/2015   SpO2 99%   BMI 26.45 kg/m   Physical Exam  Constitutional: She is oriented to person, place, and time. She appears well-developed and well-nourished. No distress.  HENT:  Head: Normocephalic and atraumatic.  Eyes: Left eye exhibits no discharge. No scleral icterus.  Cardiovascular: Regular  rhythm.  Tachycardia present.   Pulmonary/Chest: Effort normal.  Abdominal: Soft. She exhibits no distension. There is tenderness.  Neurological: She is alert and oriented to person, place, and time.  Skin: Skin is warm and dry.  Psychiatric: She has a normal mood and affect. Her behavior is normal.  Vitals reviewed.   Imaging: Ct Angio Chest Pe W Or Wo Contrast  Result Date: 12/30/2016 CLINICAL DATA:  Acute onset of generalized weakness and lethargy. Shortness of breath, generalized abdominal pain and diarrhea. Vomiting. Status post recent bilateral salpingo-oophorectomy. Initial encounter. EXAM: CT ANGIOGRAPHY CHEST CT ABDOMEN AND PELVIS WITH CONTRAST TECHNIQUE: Multidetector CT imaging of the chest was performed using the standard protocol during bolus administration of intravenous contrast. Multiplanar CT image reconstructions and MIPs were obtained to evaluate the vascular anatomy. Multidetector CT imaging of the abdomen and pelvis was performed using the standard protocol during bolus administration of intravenous contrast. CONTRAST:  75 mL of Isovue 370 IV contrast COMPARISON:  None. FINDINGS: CTA CHEST FINDINGS Cardiovascular:  There is no evidence of pulmonary embolus. The heart remains normal in size. The thoracic aorta is unremarkable in appearance. No calcific atherosclerotic disease is seen. The great vessels are unremarkable in appearance. Mediastinum/Nodes: No mediastinal lymphadenopathy is seen. No pericardial effusion is identified. The thyroid gland is unremarkable. No axillary lymphadenopathy is appreciated. Lungs/Pleura: Scattered small nodules of varying size are noted throughout the lungs, measuring up to 1.1 cm in size and suspicious for metastatic disease. Mild bibasilar atelectasis is noted. No pleural effusion or pneumothorax is seen. Musculoskeletal: No acute osseous abnormalities are identified. The visualized musculature is unremarkable in appearance. The breasts are  unremarkable in appearance, though the lateral left breast is incompletely characterized. Review of the MIP images confirms the above findings. CT ABDOMEN and PELVIS FINDINGS Hepatobiliary: A vague 1.4 cm hypodensity at the left hepatic lobe raises suspicion for metastatic disease. The liver is otherwise unremarkable. The gallbladder is grossly unremarkable in appearance. The common bile duct remains normal in caliber. Pancreas: The pancreas is grossly unremarkable in appearance. No definite pancreatic mass is identified. Spleen: The spleen is unremarkable in appearance. Adrenals/Urinary Tract: The adrenal glands are grossly unremarkable. The kidneys are within normal limits. There is no evidence of hydronephrosis. No renal or ureteral stones are identified. No perinephric stranding is seen. Stomach/Bowel: There appears to be perforation at the mid sigmoid colon, with a large contiguous collection of free fluid and air tracking across the right side of the abdomen and left hemipelvis, measuring 20-30 cm in size. Underlying diffuse mesenteric edema is noted, and scattered additional free air is seen tracking throughout the abdomen and pelvis. Findings are compatible with evolving abscess throughout the abdomen and pelvis. There is diffuse distention of small-bowel loops, concerning for underlying ileus. The stomach is grossly unremarkable in appearance. There is diffuse wall thickening along the ascending and proximal  sigmoid colon, likely reflecting underlying infection. Vascular/Lymphatic: The abdominal aorta is unremarkable in appearance. The inferior vena cava is grossly unremarkable. Retroperitoneal nodes measure up to 1.2 cm in short axis. Diffuse retroperitoneal edema is seen. No definite pelvic sidewall lymphadenopathy is appreciated. Reproductive: Irregular enhancing nodules are seen arising at the uterus. These may reflect uterine fibroids, though metastatic disease cannot be excluded. The bladder is mildly  distended and grossly unremarkable. Other: No additional soft tissue abnormalities are seen. Musculoskeletal: No acute osseous abnormalities are identified. The visualized musculature is unremarkable in appearance. Review of the MIP images confirms the above findings. IMPRESSION: 1. Perforation at the mid sigmoid colon, with a large contiguous collection of free fluid and air tracking across the right side of the abdomen and left hemipelvis, measuring 20-30 cm in size. Underlying diffuse mesenteric edema, with scattered additional free air throughout the abdomen and pelvis. Findings compatible with evolving abscess throughout the abdomen and pelvis. 2. Diffuse wall thickening along the ascending and proximal sigmoid colon, likely reflecting underlying infection. 3. Diffuse distention of small-bowel loops likely reflects underlying ileus. 4. No evidence of pulmonary embolus. 5. Scattered small nodules of varying size throughout the lungs, measuring up to 1.1 cm in size. These are suspicious for metastatic disease, given the patient's known metastatic adenocarcinoma. 6. Vague 1.4 cm hypodensity at the left hepatic lobe raises suspicion for metastatic disease. 7. Retroperitoneal nodes measure up to 1.2 cm in short axis. This is nonspecific, though metastatic disease cannot be excluded. Underlying retroperitoneal edema noted. 8. Irregular enhancing nodules arising at the uterus. These may reflect uterine fibroids, though metastatic disease cannot be excluded. 9. No definite breast or pancreatic mass is seen. Given recent surgical pathology results, the primary source of the patient's adenocarcinoma is unclear. Critical Value/emergent results were called by telephone at the time of interpretation on 12/30/2016 at 10:51 pm to Dr. Carrie Mew, who verbally acknowledged these results. Electronically Signed   By: Garald Balding M.D.   On: 12/30/2016 23:05   Ct Abdomen Pelvis W Contrast  Result Date:  01/05/2017 CLINICAL DATA:  History of adenocarcinoma. Recent perforation of the sigmoid colon with subsequent surgery. Leukocytosis. EXAM: CT ABDOMEN AND PELVIS WITH CONTRAST TECHNIQUE: Multidetector CT imaging of the abdomen and pelvis was performed using the standard protocol following bolus administration of intravenous contrast. CONTRAST:  169mL ISOVUE-300 IOPAMIDOL (ISOVUE-300) INJECTION 61%, <See Chart> ISOVUE-300 IOPAMIDOL (ISOVUE-300) INJECTION 61% COMPARISON:  Dec 30, 2016 FINDINGS: Lower chest: Pulmonary nodularity again identified, suspicious for metastatic disease given history. There is a small left pleural effusion. Bibasilar opacities are likely atelectasis. No other acute abnormalities in the lung bases. Hepatobiliary: A low-attenuation lesion in the left hepatic lobe measures 14 mm, unchanged. A cyst is seen along the inferior right hepatic lobe. No other definitive solid masses are seen within the liver. The portal vein is patent. The gallbladder is unremarkable. No other abnormalities in the liver. Pancreas: Unremarkable. No pancreatic ductal dilatation or surrounding inflammatory changes. Spleen: Normal in size without focal abnormality. Adrenals/Urinary Tract: The adrenal glands, kidneys, and ureters are normal. Air in the bladder is likely from recent catheterization. Stomach/Bowel: The distal esophagus and stomach are normal. Small bowel loops are mildly prominent but not grossly dilated. Contrast does not reach the terminal ileum but does traverse the small bowel into the region of the mid ileum. The patient is status post partial colonic resection. There is gas in the colon to the level of the left lower quadrant ostomy. Vascular/Lymphatic: The abdominal  aorta is normal in caliber. Shotty nodes in the retroperitoneum are again identified. The previously identified 12 mm node to the left of the aorta on image 35 is unchanged. No new adenopathy identified. Reproductive: Fibroids in the uterus.  By report, the ovaries have been removed. Other: There is free fluid within the abdomen and pelvis which could be postoperative. There is an organized collection of fluid as seen on axial image 73. There is peripheral enhancement as well some air within this collection. The main body of this collection measures 10.4 by 5.5 by 4.7 cm. A smaller collection of fluid is seen in the bowel mesenteries on series 2, image 53 measuring 4.7 by 2.1 cm. This is not nearly as organized and could simply be postoperative. No other definitive organized collections are seen. There is free air scattered throughout the abdomen and pelvis consistent with the recent postoperative status. Musculoskeletal: No acute changes. IMPRESSION: 1. There is an organizing collection of fluid measuring 10 x 6 x 5 cm in the pelvis. There is peripheral enhancement around this collection as well some air in the collection. Given history, the findings are suspicious for developing abscess. A smaller collection in the bowel mesentary is less specific and could simply be postoperative. 2. Pulmonary nodule suspicious for metastatic disease. 3. Low-attenuation lesion in the left hepatic lobe is nonspecific. However, given history, a metastatic lesion could have this appearance. 4. Probable mild ileus.  No definite obstruction. 5. Shotty nodes in the retroperitoneum could be reactive. However, given history, it would be difficult to completely exclude metastatic disease. These results will be called to the ordering clinician or representative by the Radiologist Assistant, and communication documented in the PACS or zVision Dashboard. Electronically Signed   By: Dorise Bullion III M.D   On: 01/05/2017 12:26   Ct Abdomen Pelvis W Contrast  Result Date: 12/30/2016 CLINICAL DATA:  Acute onset of generalized weakness and lethargy. Shortness of breath, generalized abdominal pain and diarrhea. Vomiting. Status post recent bilateral salpingo-oophorectomy. Initial  encounter. EXAM: CT ANGIOGRAPHY CHEST CT ABDOMEN AND PELVIS WITH CONTRAST TECHNIQUE: Multidetector CT imaging of the chest was performed using the standard protocol during bolus administration of intravenous contrast. Multiplanar CT image reconstructions and MIPs were obtained to evaluate the vascular anatomy. Multidetector CT imaging of the abdomen and pelvis was performed using the standard protocol during bolus administration of intravenous contrast. CONTRAST:  75 mL of Isovue 370 IV contrast COMPARISON:  None. FINDINGS: CTA CHEST FINDINGS Cardiovascular:  There is no evidence of pulmonary embolus. The heart remains normal in size. The thoracic aorta is unremarkable in appearance. No calcific atherosclerotic disease is seen. The great vessels are unremarkable in appearance. Mediastinum/Nodes: No mediastinal lymphadenopathy is seen. No pericardial effusion is identified. The thyroid gland is unremarkable. No axillary lymphadenopathy is appreciated. Lungs/Pleura: Scattered small nodules of varying size are noted throughout the lungs, measuring up to 1.1 cm in size and suspicious for metastatic disease. Mild bibasilar atelectasis is noted. No pleural effusion or pneumothorax is seen. Musculoskeletal: No acute osseous abnormalities are identified. The visualized musculature is unremarkable in appearance. The breasts are unremarkable in appearance, though the lateral left breast is incompletely characterized. Review of the MIP images confirms the above findings. CT ABDOMEN and PELVIS FINDINGS Hepatobiliary: A vague 1.4 cm hypodensity at the left hepatic lobe raises suspicion for metastatic disease. The liver is otherwise unremarkable. The gallbladder is grossly unremarkable in appearance. The common bile duct remains normal in caliber. Pancreas: The pancreas is grossly  unremarkable in appearance. No definite pancreatic mass is identified. Spleen: The spleen is unremarkable in appearance. Adrenals/Urinary Tract: The  adrenal glands are grossly unremarkable. The kidneys are within normal limits. There is no evidence of hydronephrosis. No renal or ureteral stones are identified. No perinephric stranding is seen. Stomach/Bowel: There appears to be perforation at the mid sigmoid colon, with a large contiguous collection of free fluid and air tracking across the right side of the abdomen and left hemipelvis, measuring 20-30 cm in size. Underlying diffuse mesenteric edema is noted, and scattered additional free air is seen tracking throughout the abdomen and pelvis. Findings are compatible with evolving abscess throughout the abdomen and pelvis. There is diffuse distention of small-bowel loops, concerning for underlying ileus. The stomach is grossly unremarkable in appearance. There is diffuse wall thickening along the ascending and proximal sigmoid colon, likely reflecting underlying infection. Vascular/Lymphatic: The abdominal aorta is unremarkable in appearance. The inferior vena cava is grossly unremarkable. Retroperitoneal nodes measure up to 1.2 cm in short axis. Diffuse retroperitoneal edema is seen. No definite pelvic sidewall lymphadenopathy is appreciated. Reproductive: Irregular enhancing nodules are seen arising at the uterus. These may reflect uterine fibroids, though metastatic disease cannot be excluded. The bladder is mildly distended and grossly unremarkable. Other: No additional soft tissue abnormalities are seen. Musculoskeletal: No acute osseous abnormalities are identified. The visualized musculature is unremarkable in appearance. Review of the MIP images confirms the above findings. IMPRESSION: 1. Perforation at the mid sigmoid colon, with a large contiguous collection of free fluid and air tracking across the right side of the abdomen and left hemipelvis, measuring 20-30 cm in size. Underlying diffuse mesenteric edema, with scattered additional free air throughout the abdomen and pelvis. Findings compatible with  evolving abscess throughout the abdomen and pelvis. 2. Diffuse wall thickening along the ascending and proximal sigmoid colon, likely reflecting underlying infection. 3. Diffuse distention of small-bowel loops likely reflects underlying ileus. 4. No evidence of pulmonary embolus. 5. Scattered small nodules of varying size throughout the lungs, measuring up to 1.1 cm in size. These are suspicious for metastatic disease, given the patient's known metastatic adenocarcinoma. 6. Vague 1.4 cm hypodensity at the left hepatic lobe raises suspicion for metastatic disease. 7. Retroperitoneal nodes measure up to 1.2 cm in short axis. This is nonspecific, though metastatic disease cannot be excluded. Underlying retroperitoneal edema noted. 8. Irregular enhancing nodules arising at the uterus. These may reflect uterine fibroids, though metastatic disease cannot be excluded. 9. No definite breast or pancreatic mass is seen. Given recent surgical pathology results, the primary source of the patient's adenocarcinoma is unclear. Critical Value/emergent results were called by telephone at the time of interpretation on 12/30/2016 at 10:51 pm to Dr. Carrie Mew, who verbally acknowledged these results. Electronically Signed   By: Garald Balding M.D.   On: 12/30/2016 23:05    Labs:  CBC:  Recent Labs  01/02/17 0537 01/03/17 0543 01/04/17 0305 01/05/17 0421  WBC 22.6* 17.9* 17.7* 21.0*  HGB 7.4* 7.8* 8.4* 7.8*  HCT 21.9* 22.7* 25.0* 23.0*  PLT 403 572* 685* 774*    COAGS: No results for input(s): INR, APTT in the last 8760 hours.  BMP:  Recent Labs  01/02/17 0537 01/03/17 0543 01/04/17 0305 01/05/17 0421  NA 134* 136 139 135  K 3.5 3.0* 3.2* 3.4*  CL 99* 100* 98* 100*  CO2 28 31 33* 30  GLUCOSE 118* 126* 104* 106*  BUN 10 7 8 9   CALCIUM 7.0* 7.1* 7.4* 7.2*  CREATININE 0.50 0.44 0.57 0.46  GFRNONAA >60 >60 >60 >60  GFRAA >60 >60 >60 >60    LIVER FUNCTION TESTS:  Recent Labs  12/21/16 0151  12/31/16 0627  BILITOT 0.5 3.0*  AST 26 43*  ALT 21 30  ALKPHOS 72 100  PROT 6.3* 4.5*  ALBUMIN 2.9* 1.7*    TUMOR MARKERS:  Recent Labs  01/01/17 1411  CEA 1.0  CA199 3    Assessment and Plan:  Rim enhancing fluid collection is consistent with post operative abscess.  Relatively high risk for repeat laparotomy 4 days post-op.  Pt would benefit from percutaneous drainage.   Risks, benefits and alternatives discussed with the patient.  She understands and desires to proceed with drain placement under CT guidance.   1.) To CT for drain placement.   Thank you for this interesting consult.  I greatly enjoyed [place] and look forward to participating in their care.  A copy of this report was sent to the requesting provider on this date.  Electronically Signed: Jacqulynn Cadet 01/05/2017, 3:02 PM   I spent a total of 20 Minutes in face to face in clinical consultation, greater than 50% of which was counseling/coordinating care for intraperitoneal abscess.

## 2017-01-05 NOTE — Progress Notes (Signed)
5 Days Post-Op   Subjective:  Patient without active complaint this morning. She is tolerating her regular diet and having ostomy function. She has been ambulating with ease. Not having any significant pain.  Vital signs in last 24 hours: Temp:  [98 F (36.7 C)-98.9 F (37.2 C)] 98.9 F (37.2 C) (05/13 0351) Pulse Rate:  [99-118] 112 (05/13 0351) Resp:  [16-19] 18 (05/13 0351) BP: (129-136)/(73-76) 136/73 (05/13 0351) SpO2:  [96 %-99 %] 96 % (05/13 0351) Weight:  [67.7 kg (149 lb 4.8 oz)] 67.7 kg (149 lb 4.8 oz) (05/13 0351) Last BM Date: 01/04/17  Intake/Output from previous day: 05/12 0701 - 05/13 0700 In: 600 [P.O.:600] Out: 3745 [Urine:3300; Drains:15; Stool:430]  GI: Abdomen is soft, nontender, nondistended. Wound VAC in place the midline with a good seal. JP drain to right lower quadrant draining a serous and was fluid. Ostomy present left lower quadrant that is productive of gas and stool.  Lab Results:  CBC  Recent Labs  01/04/17 0305 01/05/17 0421  WBC 17.7* 21.0*  HGB 8.4* 7.8*  HCT 25.0* 23.0*  PLT 685* 774*   CMP     Component Value Date/Time   NA 135 01/05/2017 0421   K 3.4 (L) 01/05/2017 0421   CL 100 (L) 01/05/2017 0421   CO2 30 01/05/2017 0421   GLUCOSE 106 (H) 01/05/2017 0421   BUN 9 01/05/2017 0421   CREATININE 0.46 01/05/2017 0421   CALCIUM 7.2 (L) 01/05/2017 0421   PROT 4.5 (L) 12/31/2016 0627   ALBUMIN 1.7 (L) 12/31/2016 0627   AST 43 (H) 12/31/2016 0627   ALT 30 12/31/2016 0627   ALKPHOS 100 12/31/2016 0627   BILITOT 3.0 (H) 12/31/2016 0627   GFRNONAA >60 01/05/2017 0421   GFRAA >60 01/05/2017 0421   PT/INR No results for input(s): LABPROT, INR in the last 72 hours.  Studies/Results: No results found.  Assessment/Plan: 51 year old female status post emergent drainage of pelvic abscess and Hartman's procedure. Clinically doing well but her white blood cell count continues to trend upward. Discussed this finding with the patient and  the plan to obtain a CT scan. She'll be made nothing by mouth until after the CT scan is interpreted and a plan was formulated. She is at high risk for intra-abdominal abscess which is what we are looking for the CT scan. Encourage ambulation, incentive spirometer usage. Continue IV antibiotics.   Clayburn Pert, MD [facility]  Day [facility] [phone] Night [facility] [phone]  01/05/2017

## 2017-01-05 NOTE — Progress Notes (Signed)
Per Dr. Adonis Huguenin okay to place pt on a regular diet.

## 2017-01-05 NOTE — Progress Notes (Signed)
[facility] Radiology called CT scan results to RN. Dr. Adonis Huguenin notified that results are back and to look at results when able.

## 2017-01-05 NOTE — Procedures (Signed)
Interventional Radiology Procedure Note  Procedure: Placement of a 65F drain into the RLQ fluid collection.  Aspiration yields 40 mL turbid serosanguinous fluid with debris.  Sent for culture.  Complications: None  Estimated Blood Loss: None  Recommendations: - Cx pending - Monitor drain output  Signed,  Criselda Peaches, MD

## 2017-01-05 NOTE — Progress Notes (Signed)
Consult received "Post IR Procedure Consult - Anticoagulant/Antiplatelet PTA/Inpatient Med List Review by Pharmacist"  No PTA anticoagulants or antiplatelets identified.   Enoxaparin was ordered 40 mg subcutaneously once daily for VTE prophylaxis. The order was never stopped/discontinued, although dose was held this AM because patient was off the floor. Nurse may administer dose at this time. Order is still active. No other anticoagulants or antiplatelets were identified.  Anaalicia Reimann A. [place], [place].D., BCPS Clinical Pharmacist 01/05/2017 16:41

## 2017-01-06 DIAGNOSIS — C569 Malignant neoplasm of unspecified ovary: Secondary | ICD-10-CM

## 2017-01-06 DIAGNOSIS — C787 Secondary malignant neoplasm of liver and intrahepatic bile duct: Secondary | ICD-10-CM

## 2017-01-06 DIAGNOSIS — Z933 Colostomy status: Secondary | ICD-10-CM

## 2017-01-06 LAB — CBC
HCT: 23.6 % — ABNORMAL LOW (ref 35.0–47.0)
Hemoglobin: 7.9 g/dL — ABNORMAL LOW (ref 12.0–16.0)
MCH: 30.2 pg (ref 26.0–34.0)
MCHC: 33.3 g/dL (ref 32.0–36.0)
MCV: 90.8 fL (ref 80.0–100.0)
PLATELETS: 881 10*3/uL — AB (ref 150–440)
RBC: 2.6 MIL/uL — ABNORMAL LOW (ref 3.80–5.20)
RDW: 14.3 % (ref 11.5–14.5)
WBC: 18.6 10*3/uL — AB (ref 3.6–11.0)

## 2017-01-06 LAB — BASIC METABOLIC PANEL
ANION GAP: 8 (ref 5–15)
BUN: 6 mg/dL (ref 6–20)
CO2: 31 mmol/L (ref 22–32)
CREATININE: 0.41 mg/dL — AB (ref 0.44–1.00)
Calcium: 7.4 mg/dL — ABNORMAL LOW (ref 8.9–10.3)
Chloride: 98 mmol/L — ABNORMAL LOW (ref 101–111)
GFR calc Af Amer: >60 mL/min (ref >60)
GFR calc non Af Amer: >60 mL/min (ref >60)
Glucose, Bld: 107 mg/dL — ABNORMAL HIGH (ref 65–99)
Potassium: 3 mmol/L — ABNORMAL LOW (ref 3.5–5.1)
SODIUM: 137 mmol/L (ref 135–145)

## 2017-01-06 MED ORDER — KCL IN DEXTROSE-NACL 20-5-0.45 MEQ/L-%-% IV SOLN
INTRAVENOUS | Status: DC
  Administered 2017-01-06: 1000 mL via INTRAVENOUS
  Administered 2017-01-07 – 2017-01-09 (×2): via INTRAVENOUS
  Filled 2017-01-06 (×9): qty 1000

## 2017-01-06 MED ORDER — POTASSIUM CHLORIDE CRYS ER 20 MEQ PO TBCR
40.0000 meq | EXTENDED_RELEASE_TABLET | ORAL | Status: AC
  Administered 2017-01-06: 40 meq via ORAL
  Filled 2017-01-06: qty 2

## 2017-01-06 NOTE — Care Management (Signed)
Spoke with patient and husband about process of discharging home with home health services and wound vac changes.  Patient in agreement.  I have notified her that Christella Scheuermann has approved for home health services through [facility].  Jason from [facility] has met with patient at bedside.  Patient to contact [employer] to determine copay for woundvac.  Patient states that she does not have PCP.  PCP mew patient appointment has been made for Dr. Olivia Mackie Mclean-Scocuzza 5/25 at 10 am.  Added to discharge followup.  RNCM following.

## 2017-01-06 NOTE — Consult Note (Signed)
[facility] Nurse wound consult note Reason for Consult:Negative pressure wound therapy, VAC dressing change.  Wound type:Surgical wound Pressure Injury POA: N/a Measurement: 18 cm x 4 cm x 2.2 cm  Wound AES:LPNP pink nongranulating, adipose tissue present.  Drainage (amount, consistency, odor) minimal serosanguinous.  Periwound:intact.  LLQ Colostoy present Dressing procedure/placement/frequency:Wuond VAC. Change MON/WED/Fri. WOC Nurse ostomy follow up Stoma type/location: LLQ Colostomy Stomal assessment/size: 1 1/4"  Peristomal assessment: intact Treatment options for stomal/peristomal skin: barrier ring Output soft brown stool Ostomy pouching: 2pc. 2 1/4' pouch with barrier ring  Education provided: POuch change and dressing change performed today.  Patient tolerated well.  Enrolled patient in [place] Start Discharge program: Yes Will not follow at this time.  Please re-consult if needed.  Domenic Moras RN BSN Power Pager [phone]

## 2017-01-06 NOTE — Progress Notes (Signed)
6 Days Post-Op  Subjective: Status post Hartman's procedure. Patient will require a wound VAC change today. She has no complaints at this time and is tolerating a regular diet.  Objective: Vital signs in last 24 hours: Temp:  [97.9 F (36.6 C)-98.8 F (37.1 C)] 98.8 F (37.1 C) (05/14 0432) Pulse Rate:  [105-120] 120 (05/14 0432) Resp:  [17-30] 22 (05/14 0432) BP: (115-140)/(65-81) 131/71 (05/14 0432) SpO2:  [96 %-100 %] 96 % (05/14 0432) Weight:  [138 lb (62.6 kg)] 138 lb (62.6 kg) (05/14 0432) Last BM Date: 01/04/17  Intake/Output from previous day: 05/13 0701 - 05/14 0700 In: 1669.7 [P.O.:240; I.V.:1229.7; IV Piggyback:200] Out: 9371 [Urine:6300; Drains:30; Stool:125] Intake/Output this shift: No intake/output data recorded.  Physical exam:  Ostomy functional abdomen is soft and nontender wound VAC in place to separate drainage catheters are in place one of them having been placed yesterday. Abdomen is otherwise soft and nontender calves are nontender  Lab Results: CBC   Recent Labs  01/05/17 0421 01/06/17 0326  WBC 21.0* 18.6*  HGB 7.8* 7.9*  HCT 23.0* 23.6*  PLT 774* 881*   BMET  Recent Labs  01/05/17 0421 01/06/17 0326  NA 135 137  K 3.4* 3.0*  CL 100* 98*  CO2 30 31  GLUCOSE 106* 107*  BUN 9 6  CREATININE 0.46 0.41*  CALCIUM 7.2* 7.4*   PT/INR No results for input(s): LABPROT, INR in the last 72 hours. ABG No results for input(s): PHART, HCO3 in the last 72 hours.  Invalid input(s): PCO2, PO2  Studies/Results: Ct Abdomen Pelvis W Contrast  Result Date: 01/05/2017 CLINICAL DATA:  History of adenocarcinoma. Recent perforation of the sigmoid colon with subsequent surgery. Leukocytosis. EXAM: CT ABDOMEN AND PELVIS WITH CONTRAST TECHNIQUE: Multidetector CT imaging of the abdomen and pelvis was performed using the standard protocol following bolus administration of intravenous contrast. CONTRAST:  148mL ISOVUE-300 IOPAMIDOL (ISOVUE-300) INJECTION 61%,  <See Chart> ISOVUE-300 IOPAMIDOL (ISOVUE-300) INJECTION 61% COMPARISON:  Dec 30, 2016 FINDINGS: Lower chest: Pulmonary nodularity again identified, suspicious for metastatic disease given history. There is a small left pleural effusion. Bibasilar opacities are likely atelectasis. No other acute abnormalities in the lung bases. Hepatobiliary: A low-attenuation lesion in the left hepatic lobe measures 14 mm, unchanged. A cyst is seen along the inferior right hepatic lobe. No other definitive solid masses are seen within the liver. The portal vein is patent. The gallbladder is unremarkable. No other abnormalities in the liver. Pancreas: Unremarkable. No pancreatic ductal dilatation or surrounding inflammatory changes. Spleen: Normal in size without focal abnormality. Adrenals/Urinary Tract: The adrenal glands, kidneys, and ureters are normal. Air in the bladder is likely from recent catheterization. Stomach/Bowel: The distal esophagus and stomach are normal. Small bowel loops are mildly prominent but not grossly dilated. Contrast does not reach the terminal ileum but does traverse the small bowel into the region of the mid ileum. The patient is status post partial colonic resection. There is gas in the colon to the level of the left lower quadrant ostomy. Vascular/Lymphatic: The abdominal aorta is normal in caliber. Shotty nodes in the retroperitoneum are again identified. The previously identified 12 mm node to the left of the aorta on image 35 is unchanged. No new adenopathy identified. Reproductive: Fibroids in the uterus. By report, the ovaries have been removed. Other: There is free fluid within the abdomen and pelvis which could be postoperative. There is an organized collection of fluid as seen on axial image 73. There is peripheral enhancement as  well some air within this collection. The main body of this collection measures 10.4 by 5.5 by 4.7 cm. A smaller collection of fluid is seen in the bowel mesenteries on  series 2, image 53 measuring 4.7 by 2.1 cm. This is not nearly as organized and could simply be postoperative. No other definitive organized collections are seen. There is free air scattered throughout the abdomen and pelvis consistent with the recent postoperative status. Musculoskeletal: No acute changes. IMPRESSION: 1. There is an organizing collection of fluid measuring 10 x 6 x 5 cm in the pelvis. There is peripheral enhancement around this collection as well some air in the collection. Given history, the findings are suspicious for developing abscess. A smaller collection in the bowel mesentary is less specific and could simply be postoperative. 2. Pulmonary nodule suspicious for metastatic disease. 3. Low-attenuation lesion in the left hepatic lobe is nonspecific. However, given history, a metastatic lesion could have this appearance. 4. Probable mild ileus.  No definite obstruction. 5. Shotty nodes in the retroperitoneum could be reactive. However, given history, it would be difficult to completely exclude metastatic disease. These results will be called to the ordering clinician or representative by the Radiologist Assistant, and communication documented in the PACS or zVision Dashboard. Electronically Signed   By: Dorise Bullion III M.D   On: 01/05/2017 12:26   Ct Image Guided Drainage By Percutaneous Catheter  Result Date: 01/05/2017 INDICATION: 51 year old female with a peripherally enhancing fluid collection and increasing leukocytosis status post sigmoid colon resection, left lower quadrant colostomy creation and Hartmann's pouch procedure for perforated sigmoid colon. Imaging findings are concerning for postoperative abscess. EXAM: CT GUIDED DRAINAGE OF right lower quadrant intraperitoneal abdominal ABSCESS MEDICATIONS: The patient is currently admitted to the hospital and receiving intravenous antibiotics. The antibiotics were administered within an appropriate time frame prior to the initiation  of the procedure. ANESTHESIA/SEDATION: 2 mg IV Versed 100 mcg IV Fentanyl Moderate Sedation Time:  20 minutes The patient was continuously monitored during the procedure by the interventional radiology nurse under my direct supervision. COMPLICATIONS: None immediate. TECHNIQUE: Informed written consent was obtained from the patient after a thorough discussion of the procedural risks, benefits and alternatives. All questions were addressed. Maximal Sterile Barrier Technique was utilized including caps, mask, sterile gowns, sterile gloves, sterile drape, hand hygiene and skin antiseptic. A timeout was performed prior to the initiation of the procedure. PROCEDURE: The operative field was prepped with Chlorhexidine in a sterile fashion, and a sterile drape was applied covering the operative field. A sterile gown and sterile gloves were used for the procedure. Local anesthesia was provided with 1% Lidocaine. A small dermatotomy was made. Using intermittent CT guidance, an 18 gauge trocar needle was advanced through the right lower quadrant abdominal wall taking care to avoid the inferior epigastric vessels. The needle was advanced into the fluid collection. An Amplatz wire was then advanced the needle and coiled within the fluid collection. The needle was removed. The skin tract was dilated to 68 [place] and a [place] 12 [place] all-purpose drainage catheter was advanced over the wire and formed in the fluid collection. Aspiration yields approximately 40 mL of turbid serosanguineous fluid containing internal debris. A sample was sent to the laboratory for culture. Post drain placement axial CT imaging demonstrates excellent position of the drainage catheter and near complete resolution of the fluid collection. The catheter was secured to the skin with 0 Prolene suture and a sterile bandage. The patient tolerated the procedure well. FINDINGS:  Approximately 40 mL turbid serosanguineous fluid with internal debris. IMPRESSION:  Technically successful placement of a 40 French percutaneous drain into the right lower quadrant postoperative fluid collection with aspiration as described above. Maintain tube to JP bulb suction. When drain output is minimal, recommend repeat CT scan of the pelvis with intravenous contrast prior to drain removal. Electronically Signed   By: Jacqulynn Cadet M.D.   On: 01/05/2017 16:00    Anti-infectives: Anti-infectives    Start     Dose/Rate Route Frequency Ordered Stop   01/02/17 0100  metroNIDAZOLE (FLAGYL) IVPB 500 mg     500 mg 100 mL/hr over 60 Minutes Intravenous Every 8 hours 01/01/17 2333     12/31/16 2100  levofloxacin (LEVAQUIN) IVPB 750 mg  Status:  Discontinued     750 mg 100 mL/hr over 90 Minutes Intravenous Every 24 hours 12/30/16 2143 01/02/17 1402   12/31/16 0600  metroNIDAZOLE (FLAGYL) IVPB 500 mg  Status:  Discontinued     500 mg 100 mL/hr over 60 Minutes Intravenous Every 8 hours 12/30/16 2143 01/01/17 2333   12/31/16 0600  ciprofloxacin (CIPRO) IVPB 400 mg     400 mg 200 mL/hr over 60 Minutes Intravenous Every 12 hours 12/31/16 0414     12/31/16 0400  ciprofloxacin (CIPRO) IVPB 400 mg  Status:  Discontinued     400 mg 200 mL/hr over 60 Minutes Intravenous Every 12 hours 12/31/16 0358 12/31/16 0414   12/31/16 0400  metroNIDAZOLE (FLAGYL) IVPB 500 mg  Status:  Discontinued     500 mg 100 mL/hr over 60 Minutes Intravenous Every 8 hours 12/31/16 0358 12/31/16 0415   12/30/16 2130  levofloxacin (LEVAQUIN) IVPB 750 mg     750 mg 100 mL/hr over 90 Minutes Intravenous  Once 12/30/16 2129 12/30/16 2315   12/30/16 2130  metroNIDAZOLE (FLAGYL) IVPB 500 mg     500 mg 100 mL/hr over 60 Minutes Intravenous  Once 12/30/16 2129 12/30/16 2249      Assessment/Plan: s/p Procedure(s): EXPLORATORY LAPAROTOMY, sigmoid colectomy, removal of omentum, colostomy   Slight improvement in white blood cell count. Will check again tomorrow. She is tolerating a regular diet drainage  catheters are in place. Recommend continue IV antibiotics for now and possibly repeat CT scan at the end of the week.  Florene Glen, MD, FACS  01/06/2017

## 2017-01-06 NOTE — Progress Notes (Addendum)
Nutrition Follow Up Note  DOCUMENTATION CODES:   Not applicable  INTERVENTION:   Prostat liquid protein PO 30 ml TID with meals, each supplement provides 100 kcal, 15 grams protein.  NUTRITION DIAGNOSIS:   Increased nutrient needs related to cancer, other (see comment) (recent surgery ) as evidenced by increased estimated needs from protein.   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 Patient presented with increased abdominal pain, N/V, diarrhea found to have large abscess and perforation in sigmoid colon and signs of sepsis.   -Patient s/p exploratory laparotomy with lysis of adhesions, Harmann's procedure, omentectomy, drainage of multiple interloop abscesses, placement of blake drains x 2, creation of colostomy on 5/8.  Pt advanced to regular diet and tolerating well. Pt eating 75% meals. Pt likes Prostat and would like to continue them; RD offered Ensure and told pt she can ask for Ensure anytime. Per chart, pt weight stable. Pt with wound vac in place. Ostomy with good output.    Medications reviewed and include: lovenox, protonix, KCl, ciprofloxacin, oxycodone, metronidazole  Labs reviewed: K 3.0(L), Cl 98(L), creat 0.41(L), Ca 7.4(L), P 2.9 wnl-5/8, Mg 1.7 wnl- 5/12 Wbc- 18.6(H), Hgb 7.9(L), Hct 23.6(L)  Diet Order:  Diet regular Room service appropriate? Yes; Fluid consistency: Thin  Skin:  Wound (see comment) (multiple closed surgical incisions)  Last BM:  5/12 (Ostomy )  Height:   Ht Readings from Last 1 Encounters:  12/31/16 5\' 3"  (1.6 m)     Weight:   Wt Readings from Last 1 Encounters:  01/06/17 138 lb (62.6 kg)    Ideal Body Weight:  52.3 kg  BMI:  Body mass index is 24.45 kg/m.  Estimated Nutritional Needs:   Kcal:  1565-1805 (MSJ x 1.3-1.5)  Protein:  75-95 grams (1.2-1.5 grams/kg)  Fluid:  1.8-2.1 L/day (30-35 ml/kg)  EDUCATION NEEDS:   No education needs identified at this time  Koleen Distance MS, RD, LDN Pager #- [phone]

## 2017-01-06 NOTE — Progress Notes (Signed)
Anna [name]   DOB:Dec 11, 1965   YT#:[id number]    Subjective: Events over the weekend noted; patient had a CT scan that showed developing abscess in the pelvis for which she ended up getting a drain through IR. She continues to be on antibiotics. No fevers or chills. She is tolerating food. States the colostomy is working well.  Objective:  Vitals:   01/06/17 0432 01/06/17 1140  BP: 131/71 124/72  Pulse: (!) 120 (!) 120  Resp: (!) 22   Temp: 98.8 F (37.1 C) 98.3 F (36.8 C)     Intake/Output Summary (Last 24 hours) at 01/06/17 1701 Last data filed at 01/06/17 1140  Gross per 24 hour  Intake          1360.67 ml  Output             3705 ml  Net         -2344.33 ml     GENERAL: Well-nourished well-developed; Alert, no distress and comfortable.   Alone. EYES: no pallor or icterus OROPHARYNX: no thrush or ulceration. NECK: supple, no masses felt LYMPH:  no palpable lymphadenopathy in the cervical, axillary or inguinal regions LUNGS: decreased breath sounds to auscultation at bases and  No wheeze or crackles HEART/CVS: regular rate & rhythm and no murmurs; No lower extremity edema ABDOMEN: abdomen soft, non-tender and colostomy in place.  Musculoskeletal:no cyanosis of digits and no clubbing  PSYCH: alert & oriented x 3 with fluent speech NEURO: no focal motor/sensory deficits SKIN:  no rashes or significant lesions  Labs:  Lab Results  Component Value Date   WBC 18.6 (H) 01/06/2017   HGB 7.9 (L) 01/06/2017   HCT 23.6 (L) 01/06/2017   MCV 90.8 01/06/2017   PLT 881 (H) 01/06/2017   NEUTROABS 31.8 (H) 12/31/2016    Lab Results  Component Value Date   NA 137 01/06/2017   K 3.0 (L) 01/06/2017   CL 98 (L) 01/06/2017   CO2 31 01/06/2017    Studies:  Ct Abdomen Pelvis W Contrast  Result Date: 01/05/2017 CLINICAL DATA:  History of adenocarcinoma. Recent perforation of the sigmoid colon with subsequent surgery. Leukocytosis. EXAM: CT ABDOMEN AND PELVIS WITH CONTRAST  TECHNIQUE: Multidetector CT imaging of the abdomen and pelvis was performed using the standard protocol following bolus administration of intravenous contrast. CONTRAST:  137m ISOVUE-300 IOPAMIDOL (ISOVUE-300) INJECTION 61%, <See Chart> ISOVUE-300 IOPAMIDOL (ISOVUE-300) INJECTION 61% COMPARISON:  Dec 30, 2016 FINDINGS: Lower chest: Pulmonary nodularity again identified, suspicious for metastatic disease given history. There is a small left pleural effusion. Bibasilar opacities are likely atelectasis. No other acute abnormalities in the lung bases. Hepatobiliary: A low-attenuation lesion in the left hepatic lobe measures 14 mm, unchanged. A cyst is seen along the inferior right hepatic lobe. No other definitive solid masses are seen within the liver. The portal vein is patent. The gallbladder is unremarkable. No other abnormalities in the liver. Pancreas: Unremarkable. No pancreatic ductal dilatation or surrounding inflammatory changes. Spleen: Normal in size without focal abnormality. Adrenals/Urinary Tract: The adrenal glands, kidneys, and ureters are normal. Air in the bladder is likely from recent catheterization. Stomach/Bowel: The distal esophagus and stomach are normal. Small bowel loops are mildly prominent but not grossly dilated. Contrast does not reach the terminal ileum but does traverse the small bowel into the region of the mid ileum. The patient is status post partial colonic resection. There is gas in the colon to the level of the left lower quadrant ostomy. Vascular/Lymphatic: The abdominal  aorta is normal in caliber. Shotty nodes in the retroperitoneum are again identified. The previously identified 12 mm node to the left of the aorta on image 35 is unchanged. No new adenopathy identified. Reproductive: Fibroids in the uterus. By report, the ovaries have been removed. Other: There is free fluid within the abdomen and pelvis which could be postoperative. There is an organized collection of fluid as  seen on axial image 73. There is peripheral enhancement as well some air within this collection. The main body of this collection measures 10.4 by 5.5 by 4.7 cm. A smaller collection of fluid is seen in the bowel mesenteries on series 2, image 53 measuring 4.7 by 2.1 cm. This is not nearly as organized and could simply be postoperative. No other definitive organized collections are seen. There is free air scattered throughout the abdomen and pelvis consistent with the recent postoperative status. Musculoskeletal: No acute changes. IMPRESSION: 1. There is an organizing collection of fluid measuring 10 x 6 x 5 cm in the pelvis. There is peripheral enhancement around this collection as well some air in the collection. Given history, the findings are suspicious for developing abscess. A smaller collection in the bowel mesentary is less specific and could simply be postoperative. 2. Pulmonary nodule suspicious for metastatic disease. 3. Low-attenuation lesion in the left hepatic lobe is nonspecific. However, given history, a metastatic lesion could have this appearance. 4. Probable mild ileus.  No definite obstruction. 5. Shotty nodes in the retroperitoneum could be reactive. However, given history, it would be difficult to completely exclude metastatic disease. These results will be called to the ordering clinician or representative by the Radiologist Assistant, and communication documented in the PACS or zVision Dashboard. Electronically Signed   By: Dorise Bullion III M.D   On: 01/05/2017 12:26   Ct Image Guided Drainage By Percutaneous Catheter  Result Date: 01/05/2017 INDICATION: 51 year old female with a peripherally enhancing fluid collection and increasing leukocytosis status post sigmoid colon resection, left lower quadrant colostomy creation and Hartmann's pouch procedure for perforated sigmoid colon. Imaging findings are concerning for postoperative abscess. EXAM: CT GUIDED DRAINAGE OF right lower  quadrant intraperitoneal abdominal ABSCESS MEDICATIONS: The patient is currently admitted to the hospital and receiving intravenous antibiotics. The antibiotics were administered within an appropriate time frame prior to the initiation of the procedure. ANESTHESIA/SEDATION: 2 mg IV Versed 100 mcg IV Fentanyl Moderate Sedation Time:  20 minutes The patient was continuously monitored during the procedure by the interventional radiology nurse under my direct supervision. COMPLICATIONS: None immediate. TECHNIQUE: Informed written consent was obtained from the patient after a thorough discussion of the procedural risks, benefits and alternatives. All questions were addressed. Maximal Sterile Barrier Technique was utilized including caps, mask, sterile gowns, sterile gloves, sterile drape, hand hygiene and skin antiseptic. A timeout was performed prior to the initiation of the procedure. PROCEDURE: The operative field was prepped with Chlorhexidine in a sterile fashion, and a sterile drape was applied covering the operative field. A sterile gown and sterile gloves were used for the procedure. Local anesthesia was provided with 1% Lidocaine. A small dermatotomy was made. Using intermittent CT guidance, an 18 gauge trocar needle was advanced through the right lower quadrant abdominal wall taking care to avoid the inferior epigastric vessels. The needle was advanced into the fluid collection. An Amplatz wire was then advanced the needle and coiled within the fluid collection. The needle was removed. The skin tract was dilated to 72 [place] and a [place] 12 [place]  all-purpose drainage catheter was advanced over the wire and formed in the fluid collection. Aspiration yields approximately 40 mL of turbid serosanguineous fluid containing internal debris. A sample was sent to the laboratory for culture. Post drain placement axial CT imaging demonstrates excellent position of the drainage catheter and near complete resolution of the  fluid collection. The catheter was secured to the skin with 0 Prolene suture and a sterile bandage. The patient tolerated the procedure well. FINDINGS: Approximately 40 mL turbid serosanguineous fluid with internal debris. IMPRESSION: Technically successful placement of a 47 French percutaneous drain into the right lower quadrant postoperative fluid collection with aspiration as described above. Maintain tube to JP bulb suction. When drain output is minimal, recommend repeat CT scan of the pelvis with intravenous contrast prior to drain removal. Electronically Signed   By: Jacqulynn Cadet M.D.   On: 01/05/2017 16:00    Assessment & Plan:   # 51 year old female patient status post recent bilateral salpingo-oophorectomy; currently admitted to the hospital for sigmoid bowel perforation.  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 Discussed with pathology- IHC not suggestive of old region/fallopian origin. Awaiting cancer type ID Foundation 1 testing. Patient will need GynOnc evaluation. Patient will need outpatient PET scan/mammogram.   # Sigmoid colon perforation- status post colectomy with colostomy; again suspicious for involvement with malignancy [similar to one discussed above]. Again await further workup as discussed above.  # Discussed the patient at length of the above plan. I also spoke to the patient's husband at [phone] regarding the plan.   Cammie Sickle, MD 01/06/2017  5:01 PM

## 2017-01-07 LAB — BASIC METABOLIC PANEL
ANION GAP: 4 — AB (ref 5–15)
BUN: 9 mg/dL (ref 6–20)
CALCIUM: 7.5 mg/dL — AB (ref 8.9–10.3)
CO2: 31 mmol/L (ref 22–32)
CREATININE: 0.52 mg/dL (ref 0.44–1.00)
Chloride: 101 mmol/L (ref 101–111)
Glucose, Bld: 109 mg/dL — ABNORMAL HIGH (ref 65–99)
Potassium: 3.6 mmol/L (ref 3.5–5.1)
Sodium: 136 mmol/L (ref 135–145)

## 2017-01-07 LAB — CBC WITH DIFFERENTIAL/PLATELET
Band Neutrophils: 1 %
Basophils Absolute: 0 10*3/uL (ref 0–0.1)
Basophils Relative: 0 %
Blasts: 0 %
Eosinophils Absolute: 0 10*3/uL (ref 0–0.7)
Eosinophils Relative: 0 %
HCT: 23.9 % — ABNORMAL LOW (ref 35.0–47.0)
Hemoglobin: 8.1 g/dL — ABNORMAL LOW (ref 12.0–16.0)
Lymphocytes Relative: 9 %
Lymphs Abs: 1.6 10*3/uL (ref 1.0–3.6)
MCH: 31 pg (ref 26.0–34.0)
MCHC: 34.1 g/dL (ref 32.0–36.0)
MCV: 91 fL (ref 80.0–100.0)
Metamyelocytes Relative: 1 %
Monocytes Absolute: 0.7 10*3/uL (ref 0.2–0.9)
Monocytes Relative: 4 %
Myelocytes: 0 %
Neutro Abs: 15.8 10*3/uL — ABNORMAL HIGH (ref 1.4–6.5)
Neutrophils Relative %: 85 %
Other: 0 %
Platelets: 970 10*3/uL (ref 150–440)
Promyelocytes Absolute: 0 %
RBC: 2.62 MIL/uL — ABNORMAL LOW (ref 3.80–5.20)
RDW: 14.6 % — ABNORMAL HIGH (ref 11.5–14.5)
WBC: 18.1 10*3/uL — ABNORMAL HIGH (ref 3.6–11.0)
nRBC: 0 /100 WBC

## 2017-01-07 LAB — MAGNESIUM: Magnesium: 1.6 mg/dL — ABNORMAL LOW (ref 1.7–2.4)

## 2017-01-07 MED ORDER — IOPAMIDOL (ISOVUE-300) INJECTION 61%
15.0000 mL | INTRAVENOUS | Status: AC
  Administered 2017-01-08 (×2): 15 mL via ORAL

## 2017-01-07 NOTE — Progress Notes (Signed)
Notified dr.davis of critical platelets of 970. Acknowledged and no new orders placed.

## 2017-01-07 NOTE — Progress Notes (Signed)
7 Days Post-Op  Subjective: Patient is status post colostomy and recent IR drainage of a pelvic abscess. She feels well as tolerating a regular diet. Of note her platelet count has been elevated. This likely secondary thrombocytosis from inflammatory process.  Objective: Vital signs in last 24 hours: Temp:  [98 F (36.7 C)-98.6 F (37 C)] 98 F (36.7 C) (05/15 0523) Pulse Rate:  [113-120] 113 (05/15 0523) Resp:  [16] 16 (05/15 0523) BP: (120-135)/(64-72) 120/64 (05/15 0523) SpO2:  [98 %-100 %] 98 % (05/15 0523) Weight:  [139 lb 14.4 oz (63.5 kg)] 139 lb 14.4 oz (63.5 kg) (05/15 0523) Last BM Date: 01/05/17  Intake/Output from previous day: 05/14 0701 - 05/15 0700 In: 2380 [P.O.:600; I.V.:1180; IV Piggyback:600] Out: 2810 [Urine:2700; Drains:110] Intake/Output this shift: No intake/output data recorded.  Physical exam:  Ostomy functional abdomen soft nontender nondistended nontympanitic drains in place.  Lab Results: CBC   Recent Labs  01/06/17 0326 01/07/17 0454  WBC 18.6* 18.1*  HGB 7.9* 8.1*  HCT 23.6* 23.9*  PLT 881* 970*   BMET  Recent Labs  01/06/17 0326 01/07/17 0454  NA 137 136  K 3.0* 3.6  CL 98* 101  CO2 31 31  GLUCOSE 107* 109*  BUN 6 9  CREATININE 0.41* 0.52  CALCIUM 7.4* 7.5*   PT/INR No results for input(s): LABPROT, INR in the last 72 hours. ABG No results for input(s): PHART, HCO3 in the last 72 hours.  Invalid input(s): PCO2, PO2  Studies/Results: Ct Abdomen Pelvis W Contrast  Result Date: 01/05/2017 CLINICAL DATA:  History of adenocarcinoma. Recent perforation of the sigmoid colon with subsequent surgery. Leukocytosis. EXAM: CT ABDOMEN AND PELVIS WITH CONTRAST TECHNIQUE: Multidetector CT imaging of the abdomen and pelvis was performed using the standard protocol following bolus administration of intravenous contrast. CONTRAST:  13mL ISOVUE-300 IOPAMIDOL (ISOVUE-300) INJECTION 61%, <See Chart> ISOVUE-300 IOPAMIDOL (ISOVUE-300)  INJECTION 61% COMPARISON:  Dec 30, 2016 FINDINGS: Lower chest: Pulmonary nodularity again identified, suspicious for metastatic disease given history. There is a small left pleural effusion. Bibasilar opacities are likely atelectasis. No other acute abnormalities in the lung bases. Hepatobiliary: A low-attenuation lesion in the left hepatic lobe measures 14 mm, unchanged. A cyst is seen along the inferior right hepatic lobe. No other definitive solid masses are seen within the liver. The portal vein is patent. The gallbladder is unremarkable. No other abnormalities in the liver. Pancreas: Unremarkable. No pancreatic ductal dilatation or surrounding inflammatory changes. Spleen: Normal in size without focal abnormality. Adrenals/Urinary Tract: The adrenal glands, kidneys, and ureters are normal. Air in the bladder is likely from recent catheterization. Stomach/Bowel: The distal esophagus and stomach are normal. Small bowel loops are mildly prominent but not grossly dilated. Contrast does not reach the terminal ileum but does traverse the small bowel into the region of the mid ileum. The patient is status post partial colonic resection. There is gas in the colon to the level of the left lower quadrant ostomy. Vascular/Lymphatic: The abdominal aorta is normal in caliber. Shotty nodes in the retroperitoneum are again identified. The previously identified 12 mm node to the left of the aorta on image 35 is unchanged. No new adenopathy identified. Reproductive: Fibroids in the uterus. By report, the ovaries have been removed. Other: There is free fluid within the abdomen and pelvis which could be postoperative. There is an organized collection of fluid as seen on axial image 73. There is peripheral enhancement as well some air within this collection. The main body of  this collection measures 10.4 by 5.5 by 4.7 cm. A smaller collection of fluid is seen in the bowel mesenteries on series 2, image 53 measuring 4.7 by 2.1 cm.  This is not nearly as organized and could simply be postoperative. No other definitive organized collections are seen. There is free air scattered throughout the abdomen and pelvis consistent with the recent postoperative status. Musculoskeletal: No acute changes. IMPRESSION: 1. There is an organizing collection of fluid measuring 10 x 6 x 5 cm in the pelvis. There is peripheral enhancement around this collection as well some air in the collection. Given history, the findings are suspicious for developing abscess. A smaller collection in the bowel mesentary is less specific and could simply be postoperative. 2. Pulmonary nodule suspicious for metastatic disease. 3. Low-attenuation lesion in the left hepatic lobe is nonspecific. However, given history, a metastatic lesion could have this appearance. 4. Probable mild ileus.  No definite obstruction. 5. Shotty nodes in the retroperitoneum could be reactive. However, given history, it would be difficult to completely exclude metastatic disease. These results will be called to the ordering clinician or representative by the Radiologist Assistant, and communication documented in the PACS or zVision Dashboard. Electronically Signed   By: Dorise Bullion III M.D   On: 01/05/2017 12:26   Ct Image Guided Drainage By Percutaneous Catheter  Result Date: 01/05/2017 INDICATION: 51 year old female with a peripherally enhancing fluid collection and increasing leukocytosis status post sigmoid colon resection, left lower quadrant colostomy creation and Hartmann's pouch procedure for perforated sigmoid colon. Imaging findings are concerning for postoperative abscess. EXAM: CT GUIDED DRAINAGE OF right lower quadrant intraperitoneal abdominal ABSCESS MEDICATIONS: The patient is currently admitted to the hospital and receiving intravenous antibiotics. The antibiotics were administered within an appropriate time frame prior to the initiation of the procedure. ANESTHESIA/SEDATION: 2 mg  IV Versed 100 mcg IV Fentanyl Moderate Sedation Time:  20 minutes The patient was continuously monitored during the procedure by the interventional radiology nurse under my direct supervision. COMPLICATIONS: None immediate. TECHNIQUE: Informed written consent was obtained from the patient after a thorough discussion of the procedural risks, benefits and alternatives. All questions were addressed. Maximal Sterile Barrier Technique was utilized including caps, mask, sterile gowns, sterile gloves, sterile drape, hand hygiene and skin antiseptic. A timeout was performed prior to the initiation of the procedure. PROCEDURE: The operative field was prepped with Chlorhexidine in a sterile fashion, and a sterile drape was applied covering the operative field. A sterile gown and sterile gloves were used for the procedure. Local anesthesia was provided with 1% Lidocaine. A small dermatotomy was made. Using intermittent CT guidance, an 18 gauge trocar needle was advanced through the right lower quadrant abdominal wall taking care to avoid the inferior epigastric vessels. The needle was advanced into the fluid collection. An Amplatz wire was then advanced the needle and coiled within the fluid collection. The needle was removed. The skin tract was dilated to 55 [place] and a Cook 12 [place] all-purpose drainage catheter was advanced over the wire and formed in the fluid collection. Aspiration yields approximately 40 mL of turbid serosanguineous fluid containing internal debris. A sample was sent to the laboratory for culture. Post drain placement axial CT imaging demonstrates excellent position of the drainage catheter and near complete resolution of the fluid collection. The catheter was secured to the skin with 0 Prolene suture and a sterile bandage. The patient tolerated the procedure well. FINDINGS: Approximately 40 mL turbid serosanguineous fluid with internal debris. IMPRESSION:  Technically successful placement of a 12  French percutaneous drain into the right lower quadrant postoperative fluid collection with aspiration as described above. Maintain tube to JP bulb suction. When drain output is minimal, recommend repeat CT scan of the pelvis with intravenous contrast prior to drain removal. Electronically Signed   By: Jacqulynn Cadet M.D.   On: 01/05/2017 16:00    Anti-infectives: Anti-infectives    Start     Dose/Rate Route Frequency Ordered Stop   01/02/17 0100  metroNIDAZOLE (FLAGYL) IVPB 500 mg     500 mg 100 mL/hr over 60 Minutes Intravenous Every 8 hours 01/01/17 2333     12/31/16 2100  levofloxacin (LEVAQUIN) IVPB 750 mg  Status:  Discontinued     750 mg 100 mL/hr over 90 Minutes Intravenous Every 24 hours 12/30/16 2143 01/02/17 1402   12/31/16 0600  metroNIDAZOLE (FLAGYL) IVPB 500 mg  Status:  Discontinued     500 mg 100 mL/hr over 60 Minutes Intravenous Every 8 hours 12/30/16 2143 01/01/17 2333   12/31/16 0600  ciprofloxacin (CIPRO) IVPB 400 mg     400 mg 200 mL/hr over 60 Minutes Intravenous Every 12 hours 12/31/16 0414     12/31/16 0400  ciprofloxacin (CIPRO) IVPB 400 mg  Status:  Discontinued     400 mg 200 mL/hr over 60 Minutes Intravenous Every 12 hours 12/31/16 0358 12/31/16 0414   12/31/16 0400  metroNIDAZOLE (FLAGYL) IVPB 500 mg  Status:  Discontinued     500 mg 100 mL/hr over 60 Minutes Intravenous Every 8 hours 12/31/16 0358 12/31/16 0415   12/30/16 2130  levofloxacin (LEVAQUIN) IVPB 750 mg     750 mg 100 mL/hr over 90 Minutes Intravenous  Once 12/30/16 2129 12/30/16 2315   12/30/16 2130  metroNIDAZOLE (FLAGYL) IVPB 500 mg     500 mg 100 mL/hr over 60 Minutes Intravenous  Once 12/30/16 2129 12/30/16 2249      Assessment/Plan: s/p Procedure(s): EXPLORATORY LAPAROTOMY, sigmoid colectomy, removal of omentum, colostomy   Patient is doing quite well. She had a an IR drainage procedure on Sunday I would repeat a set CAT scan tomorrow to reassess for cavity collapsed and possible  switching to oral antibiotics and discharge in the next day or 2 or elevated platelet count is secondary thrombocytosis and does not require treatment at this time.  Florene Glen, MD, FACS  01/07/2017

## 2017-01-08 ENCOUNTER — Inpatient Hospital Stay: Payer: Managed Care, Other (non HMO)

## 2017-01-08 DIAGNOSIS — C7801 Secondary malignant neoplasm of right lung: Secondary | ICD-10-CM

## 2017-01-08 DIAGNOSIS — N739 Female pelvic inflammatory disease, unspecified: Secondary | ICD-10-CM

## 2017-01-08 DIAGNOSIS — D631 Anemia in chronic kidney disease: Secondary | ICD-10-CM

## 2017-01-08 DIAGNOSIS — C562 Malignant neoplasm of left ovary: Secondary | ICD-10-CM

## 2017-01-08 DIAGNOSIS — C7802 Secondary malignant neoplasm of left lung: Secondary | ICD-10-CM

## 2017-01-08 DIAGNOSIS — D649 Anemia, unspecified: Secondary | ICD-10-CM

## 2017-01-08 DIAGNOSIS — D473 Essential (hemorrhagic) thrombocythemia: Secondary | ICD-10-CM

## 2017-01-08 LAB — CBC WITH DIFFERENTIAL/PLATELET
BAND NEUTROPHILS: 1 %
BASOS ABS: 0 10*3/uL (ref 0–0.1)
BLASTS: 0 %
Basophils Relative: 0 %
EOS ABS: 0.2 10*3/uL (ref 0–0.7)
Eosinophils Relative: 1 %
HEMATOCRIT: 23.9 % — AB (ref 35.0–47.0)
Hemoglobin: 7.9 g/dL — ABNORMAL LOW (ref 12.0–16.0)
Lymphocytes Relative: 8 %
Lymphs Abs: 1.4 10*3/uL (ref 1.0–3.6)
MCH: 30 pg (ref 26.0–34.0)
MCHC: 33 g/dL (ref 32.0–36.0)
MCV: 90.8 fL (ref 80.0–100.0)
METAMYELOCYTES PCT: 2 %
MONOS PCT: 6 %
MYELOCYTES: 0 %
Monocytes Absolute: 1.1 10*3/uL — ABNORMAL HIGH (ref 0.2–0.9)
Neutro Abs: 14.9 10*3/uL — ABNORMAL HIGH (ref 1.4–6.5)
Neutrophils Relative %: 82 %
Other: 0 %
PLATELETS: 1042 10*3/uL — AB (ref 150–440)
Promyelocytes Absolute: 0 %
RBC: 2.64 MIL/uL — AB (ref 3.80–5.20)
RDW: 14.9 % — AB (ref 11.5–14.5)
WBC: 17.6 10*3/uL — AB (ref 3.6–11.0)
nRBC: 0 /100 WBC

## 2017-01-08 LAB — BASIC METABOLIC PANEL
ANION GAP: 6 (ref 5–15)
BUN: 9 mg/dL (ref 6–20)
CALCIUM: 7.8 mg/dL — AB (ref 8.9–10.3)
CO2: 29 mmol/L (ref 22–32)
Chloride: 102 mmol/L (ref 101–111)
Creatinine, Ser: 0.57 mg/dL (ref 0.44–1.00)
Glucose, Bld: 107 mg/dL — ABNORMAL HIGH (ref 65–99)
POTASSIUM: 3.5 mmol/L (ref 3.5–5.1)
Sodium: 137 mmol/L (ref 135–145)

## 2017-01-08 MED ORDER — IOPAMIDOL (ISOVUE-300) INJECTION 61%
85.0000 mL | Freq: Once | INTRAVENOUS | Status: AC | PRN
  Administered 2017-01-08: 85 mL via INTRAVENOUS

## 2017-01-08 MED ORDER — SODIUM CHLORIDE 0.9% FLUSH
5.0000 mL | Freq: Three times a day (TID) | INTRAVENOUS | Status: DC
  Administered 2017-01-08 – 2017-01-09 (×2): 5 mL

## 2017-01-08 NOTE — Progress Notes (Signed)
8 Days Post-Op  Subjective: This patient with acute diverticulitis with abscess in the pelvis which was recently drained. She has no complaints today is feeling well tolerating a diet she is scheduled for CT scan later today and is drinking oral contrast.  Objective: Vital signs in last 24 hours: Temp:  [98.2 F (36.8 C)-98.9 F (37.2 C)] 98.2 F (36.8 C) (05/16 0510) Pulse Rate:  [112-123] 112 (05/16 0510) Resp:  [17-18] 17 (05/16 0510) BP: (111-125)/(65-68) 122/68 (05/16 0510) SpO2:  [97 %-100 %] 99 % (05/16 0510) Weight:  [136 lb 14.4 oz (62.1 kg)] 136 lb 14.4 oz (62.1 kg) (05/16 0500) Last BM Date: 01/07/17  Intake/Output from previous day: 05/15 0701 - 05/16 0700 In: 2135.3 [P.O.:360; I.V.:1322.3; IV Piggyback:450] Out: 2753 [Urine:2250; Drains:23; Stool:480] Intake/Output this shift: Total I/O In: -  Out: 500 [Urine:400; Stool:100]  Physical exam:  Afebrile abdomen soft and minimally tender around the drains ostomy functional  Lab Results: CBC   Recent Labs  01/07/17 0454 01/08/17 0538  WBC 18.1* 17.6*  HGB 8.1* 7.9*  HCT 23.9* 23.9*  PLT 970* 1,042*   BMET  Recent Labs  01/07/17 0454 01/08/17 0538  NA 136 137  K 3.6 3.5  CL 101 102  CO2 31 29  GLUCOSE 109* 107*  BUN 9 9  CREATININE 0.52 0.57  CALCIUM 7.5* 7.8*   PT/INR No results for input(s): LABPROT, INR in the last 72 hours. ABG No results for input(s): PHART, HCO3 in the last 72 hours.  Invalid input(s): PCO2, PO2  Studies/Results: No results found.  Anti-infectives: Anti-infectives    Start     Dose/Rate Route Frequency Ordered Stop   01/02/17 0100  metroNIDAZOLE (FLAGYL) IVPB 500 mg     500 mg 100 mL/hr over 60 Minutes Intravenous Every 8 hours 01/01/17 2333     12/31/16 2100  levofloxacin (LEVAQUIN) IVPB 750 mg  Status:  Discontinued     750 mg 100 mL/hr over 90 Minutes Intravenous Every 24 hours 12/30/16 2143 01/02/17 1402   12/31/16 0600  metroNIDAZOLE (FLAGYL) IVPB 500 mg   Status:  Discontinued     500 mg 100 mL/hr over 60 Minutes Intravenous Every 8 hours 12/30/16 2143 01/01/17 2333   12/31/16 0600  ciprofloxacin (CIPRO) IVPB 400 mg     400 mg 200 mL/hr over 60 Minutes Intravenous Every 12 hours 12/31/16 0414     12/31/16 0400  ciprofloxacin (CIPRO) IVPB 400 mg  Status:  Discontinued     400 mg 200 mL/hr over 60 Minutes Intravenous Every 12 hours 12/31/16 0358 12/31/16 0414   12/31/16 0400  metroNIDAZOLE (FLAGYL) IVPB 500 mg  Status:  Discontinued     500 mg 100 mL/hr over 60 Minutes Intravenous Every 8 hours 12/31/16 0358 12/31/16 0415   12/30/16 2130  levofloxacin (LEVAQUIN) IVPB 750 mg     750 mg 100 mL/hr over 90 Minutes Intravenous  Once 12/30/16 2129 12/30/16 2315   12/30/16 2130  metroNIDAZOLE (FLAGYL) IVPB 500 mg     500 mg 100 mL/hr over 60 Minutes Intravenous  Once 12/30/16 2129 12/30/16 2249      Assessment/Plan: s/p Procedure(s): EXPLORATORY LAPAROTOMY, sigmoid colectomy, removal of omentum, colostomy   White blood cell count remains elevated CT scan is scheduled for today and the patient is drinking her contrast will review the CT scan and discuss options and plans for discharge in the near future.  Florene Glen, MD, FACS  01/08/2017

## 2017-01-08 NOTE — Progress Notes (Signed)
Anna [name]   DOB:12/02/1965   AY#:[id number]    Subjective: Patient is morning had a CT scan to reevaluate the pelvic abscess. No fever no chills. She is tolerating diet well. No nausea vomiting. Colostomy working well. Objective:  Vitals:   01/08/17 0510 01/08/17 1358  BP: 122/68 120/63  Pulse: (!) 112 98  Resp: 17 18  Temp: 98.2 F (36.8 C) 98.3 F (36.8 C)     Intake/Output Summary (Last 24 hours) at 01/08/17 1706 Last data filed at 01/08/17 1500  Gross per 24 hour  Intake             1223 ml  Output             4485 ml  Net            -3262 ml     GENERAL: Well-nourished well-developed; Alert, no distress and comfortable. Accompanied by her husband. EYES: no pallor or icterus OROPHARYNX: no thrush or ulceration. NECK: supple, no masses felt LUNGS: decreased breath sounds to auscultation at bases and  No wheeze or crackles HEART/CVS: regular rate & rhythm and no murmurs; No lower extremity edema ABDOMEN: abdomen soft, non-tender and colostomy in place.  Musculoskeletal:no cyanosis of digits and no clubbing  PSYCH: alert & oriented x 3 with fluent speech NEURO: no focal motor/sensory deficits SKIN:  no rashes or significant lesions  Labs:  Lab Results  Component Value Date   WBC 17.6 (H) 01/08/2017   HGB 7.9 (L) 01/08/2017   HCT 23.9 (L) 01/08/2017   MCV 90.8 01/08/2017   PLT 1,042 (HH) 01/08/2017   NEUTROABS 14.9 (H) 01/08/2017    Lab Results  Component Value Date   NA 137 01/08/2017   K 3.5 01/08/2017   CL 102 01/08/2017   CO2 29 01/08/2017    Studies:  Ct Abdomen Pelvis W Contrast  Result Date: 01/08/2017 CLINICAL DATA:  Diverticular abscess. EXAM: CT ABDOMEN AND PELVIS WITH CONTRAST TECHNIQUE: Multidetector CT imaging of the abdomen and pelvis was performed using the standard protocol following bolus administration of intravenous contrast. CONTRAST:  34m ISOVUE-300 IOPAMIDOL (ISOVUE-300) INJECTION 61% COMPARISON:  CT scan of Jan 05, 2017.  FINDINGS: Lower chest: Bibasilar pulmonary nodules are noted concerning for metastatic disease. Mild bibasilar subsegmental atelectasis is noted as well. Hepatobiliary: Probable sludge is noted within the gallbladder. Stable 1.4 cm low density with irregular margins is noted in the left hepatic lobe which is not changed compared to prior exam. This is concerning for possible metastatic focus. Pancreas: Unremarkable. No pancreatic ductal dilatation or surrounding inflammatory changes. Spleen: Normal in size without focal abnormality. Adrenals/Urinary Tract: Adrenal glands are unremarkable. Kidneys are normal, without renal calculi, focal lesion, or hydronephrosis. Bladder is unremarkable. Stomach/Bowel: Visualized esophagus and stomach appear normal. Mildly dilated proximal small bowel loops are noted concerning for ileus. 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 Status post partial resection of sigmoid colon. Vascular/Lymphatic: No significant vascular findings are present. No enlarged abdominal or pelvic lymph nodes. Reproductive: Multiple small uterine fibroids are noted. Reportedly both ovaries have been removed. Other: There is been interval placement of percutaneous drain into pelvic abscess described on prior exam. The abscess is significantly decreased in size, although it measures 4.3 x 1.8 cm superiorly with 2.9 x 1.0 cm fluid collections seen to the right of the uterus and 2.4 x 1.5 cm fluid collection seen to the left of the uterus. Stable position of surgical drain is seen entering the right anterior abdominal wall, with  distal tip in the right lower quadrant. Musculoskeletal: No acute or significant osseous findings. IMPRESSION: Bibasilar pulmonary nodules are again noted concerning for metastatic disease. Probable sludge seen within the gallbladder. Stable 1.4 cm low density noted in left hepatic lobe concerning for possible metastatic disease. 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 Interval  placement of percutaneous drain into the pelvic abscess described on prior exam. This abscess is significantly smaller compared to prior exam. Stable position of surgical drain seen involving right side of abdomen. Electronically Signed   By: Marijo Conception, M.D.   On: 01/08/2017 09:08    Assessment & Plan:   # 51 year old female patient status post recent bilateral salpingo-oophorectomy; currently admitted to the hospital for sigmoid bowel perforation.  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 Likely metastatic disease question primary. Awaiting cancer type ID/Foundation on testing.  # Sigmoid colon perforation- status post colectomy with colostomy; again suspicious for involvement with malignancy [similar to one discussed above].   # Anemia- secondary to hospitalization acute infection.  # Thrombocytosis- platelets above 1 million- likely reactive. If not improved and recommend workup for myeloproliferative neoplasm.  # Pelvic abscess- status post drainage; on antibiotics. Repeat CT scan showed improvement of the pelvic abscess.  # Discussed the patient/husband at length of the above plan. She will need outpatient follow-up-discussed the patient has been.  Cammie Sickle, MD 01/08/2017  5:06 PM

## 2017-01-09 NOTE — Progress Notes (Signed)
9 Days Post-Op  Subjective: Patient feels well today is tolerating a regular diet.  Objective: Vital signs in last 24 hours: Temp:  [98.3 F (36.8 C)-99.5 F (37.5 C)] 99 F (37.2 C) (05/17 0524) Pulse Rate:  [98-125] 109 (05/17 0524) Resp:  [16-18] 16 (05/17 0524) BP: (115-120)/(57-63) 119/62 (05/17 0524) SpO2:  [98 %-99 %] 98 % (05/17 0524) Last BM Date: 01/08/17  Intake/Output from previous day: 05/16 0701 - 05/17 0700 In: 1491 [P.O.:240; I.V.:1046; IV Piggyback:200] Out: 3154 [Urine:4300; Drains:27; Stool:350] Intake/Output this shift: Total I/O In: 389.2 [I.V.:89.2; IV Piggyback:300] Out: 0   Physical exam:  Wound VAC in place abdomen is soft nontender drains in place as well.  Lab Results: CBC   Recent Labs  01/07/17 0454 01/08/17 0538  WBC 18.1* 17.6*  HGB 8.1* 7.9*  HCT 23.9* 23.9*  PLT 970* 1,042*   BMET  Recent Labs  01/07/17 0454 01/08/17 0538  NA 136 137  K 3.6 3.5  CL 101 102  CO2 31 29  GLUCOSE 109* 107*  BUN 9 9  CREATININE 0.52 0.57  CALCIUM 7.5* 7.8*   PT/INR No results for input(s): LABPROT, INR in the last 72 hours. ABG No results for input(s): PHART, HCO3 in the last 72 hours.  Invalid input(s): PCO2, PO2  Studies/Results: Ct Abdomen Pelvis W Contrast  Result Date: 01/08/2017 CLINICAL DATA:  Diverticular abscess. EXAM: CT ABDOMEN AND PELVIS WITH CONTRAST TECHNIQUE: Multidetector CT imaging of the abdomen and pelvis was performed using the standard protocol following bolus administration of intravenous contrast. CONTRAST:  58mL ISOVUE-300 IOPAMIDOL (ISOVUE-300) INJECTION 61% COMPARISON:  CT scan of Jan 05, 2017. FINDINGS: Lower chest: Bibasilar pulmonary nodules are noted concerning for metastatic disease. Mild bibasilar subsegmental atelectasis is noted as well. Hepatobiliary: Probable sludge is noted within the gallbladder. Stable 1.4 cm low density with irregular margins is noted in the left hepatic lobe which is not changed  compared to prior exam. This is concerning for possible metastatic focus. Pancreas: Unremarkable. No pancreatic ductal dilatation or surrounding inflammatory changes. Spleen: Normal in size without focal abnormality. Adrenals/Urinary Tract: Adrenal glands are unremarkable. Kidneys are normal, without renal calculi, focal lesion, or hydronephrosis. Bladder is unremarkable. Stomach/Bowel: Visualized esophagus and stomach appear normal. Mildly dilated proximal small bowel loops are noted concerning for ileus. 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 Status post partial resection of sigmoid colon. Vascular/Lymphatic: No significant vascular findings are present. No enlarged abdominal or pelvic lymph nodes. Reproductive: Multiple small uterine fibroids are noted. Reportedly both ovaries have been removed. Other: There is been interval placement of percutaneous drain into pelvic abscess described on prior exam. The abscess is significantly decreased in size, although it measures 4.3 x 1.8 cm superiorly with 2.9 x 1.0 cm fluid collections seen to the right of the uterus and 2.4 x 1.5 cm fluid collection seen to the left of the uterus. Stable position of surgical drain is seen entering the right anterior abdominal wall, with distal tip in the right lower quadrant. Musculoskeletal: No acute or significant osseous findings. IMPRESSION: Bibasilar pulmonary nodules are again noted concerning for metastatic disease. Probable sludge seen within the gallbladder. Stable 1.4 cm low density noted in left hepatic lobe concerning for possible metastatic disease. 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 Interval placement of percutaneous drain into the pelvic abscess described on prior exam. This abscess is significantly smaller compared to prior exam. Stable position of surgical drain seen involving right side of abdomen. Electronically Signed   By:  Marijo Conception, M.D.   On: 01/08/2017 09:08     Anti-infectives: Anti-infectives    Start     Dose/Rate Route Frequency Ordered Stop   01/02/17 0100  metroNIDAZOLE (FLAGYL) IVPB 500 mg     500 mg 100 mL/hr over 60 Minutes Intravenous Every 8 hours 01/01/17 2333     12/31/16 2100  levofloxacin (LEVAQUIN) IVPB 750 mg  Status:  Discontinued     750 mg 100 mL/hr over 90 Minutes Intravenous Every 24 hours 12/30/16 2143 01/02/17 1402   12/31/16 0600  metroNIDAZOLE (FLAGYL) IVPB 500 mg  Status:  Discontinued     500 mg 100 mL/hr over 60 Minutes Intravenous Every 8 hours 12/30/16 2143 01/01/17 2333   12/31/16 0600  ciprofloxacin (CIPRO) IVPB 400 mg     400 mg 200 mL/hr over 60 Minutes Intravenous Every 12 hours 12/31/16 0414     12/31/16 0400  ciprofloxacin (CIPRO) IVPB 400 mg  Status:  Discontinued     400 mg 200 mL/hr over 60 Minutes Intravenous Every 12 hours 12/31/16 0358 12/31/16 0414   12/31/16 0400  metroNIDAZOLE (FLAGYL) IVPB 500 mg  Status:  Discontinued     500 mg 100 mL/hr over 60 Minutes Intravenous Every 8 hours 12/31/16 0358 12/31/16 0415   12/30/16 2130  levofloxacin (LEVAQUIN) IVPB 750 mg     750 mg 100 mL/hr over 90 Minutes Intravenous  Once 12/30/16 2129 12/30/16 2315   12/30/16 2130  metroNIDAZOLE (FLAGYL) IVPB 500 mg     500 mg 100 mL/hr over 60 Minutes Intravenous  Once 12/30/16 2129 12/30/16 2249      Assessment/Plan: s/p Procedure(s): EXPLORATORY LAPAROTOMY, sigmoid colectomy, removal of omentum, colostomy   CT scan is been personally reviewed showing considerable inflammatory process in the pelvis still with minimal small fluid collections. No drainable abscess. Patient to continue on IV antibiotics today and we'll check a white blood cell count tomorrow possibly with discharge tomorrow with drains in place and wound VAC changes by nursing at home.  Florene Glen, MD, FACS  01/09/2017

## 2017-01-10 DIAGNOSIS — C775 Secondary and unspecified malignant neoplasm of intrapelvic lymph nodes: Secondary | ICD-10-CM

## 2017-01-10 DIAGNOSIS — D696 Thrombocytopenia, unspecified: Secondary | ICD-10-CM

## 2017-01-10 DIAGNOSIS — Z90710 Acquired absence of both cervix and uterus: Secondary | ICD-10-CM

## 2017-01-10 DIAGNOSIS — C7962 Secondary malignant neoplasm of left ovary: Secondary | ICD-10-CM

## 2017-01-10 LAB — CBC WITH DIFFERENTIAL/PLATELET
Basophils Absolute: 0.1 10*3/uL (ref 0–0.1)
Basophils Relative: 1 %
EOS ABS: 0.1 10*3/uL (ref 0–0.7)
Eosinophils Relative: 1 %
HEMATOCRIT: 23.3 % — AB (ref 35.0–47.0)
HEMOGLOBIN: 7.9 g/dL — AB (ref 12.0–16.0)
LYMPHS ABS: 0.8 10*3/uL — AB (ref 1.0–3.6)
Lymphocytes Relative: 7 %
MCH: 30.9 pg (ref 26.0–34.0)
MCHC: 33.7 g/dL (ref 32.0–36.0)
MCV: 91.7 fL (ref 80.0–100.0)
MONOS PCT: 6 %
Monocytes Absolute: 0.7 10*3/uL (ref 0.2–0.9)
NEUTROS PCT: 85 %
Neutro Abs: 10.1 10*3/uL — ABNORMAL HIGH (ref 1.4–6.5)
Platelets: 1029 10*3/uL (ref 150–440)
RBC: 2.54 MIL/uL — ABNORMAL LOW (ref 3.80–5.20)
RDW: 15.4 % — ABNORMAL HIGH (ref 11.5–14.5)
WBC: 11.8 10*3/uL — ABNORMAL HIGH (ref 3.6–11.0)

## 2017-01-10 LAB — AEROBIC/ANAEROBIC CULTURE (SURGICAL/DEEP WOUND)

## 2017-01-10 LAB — AEROBIC/ANAEROBIC CULTURE W GRAM STAIN (SURGICAL/DEEP WOUND): Culture: NO GROWTH

## 2017-01-10 MED ORDER — OXYCODONE-ACETAMINOPHEN 5-325 MG PO TABS: 1.0000 | ORAL_TABLET | Freq: Four times a day (QID) | ORAL | 0 refills | Status: DC | PRN

## 2017-01-10 MED ORDER — METRONIDAZOLE 500 MG PO TABS
500.0000 mg | ORAL_TABLET | Freq: Three times a day (TID) | ORAL | 1 refills | Status: DC
Start: 2017-01-10 — End: 2017-01-24

## 2017-01-10 MED ORDER — CIPROFLOXACIN HCL 500 MG PO TABS: 500.0000 mg | ORAL_TABLET | Freq: Two times a day (BID) | ORAL | 1 refills | Status: DC

## 2017-01-10 NOTE — Discharge Instructions (Signed)
Regular diet Resume home medications Take oral antibiotics and oral analgesics as were prescribed May shower Home health care to assist with ostomy and wound VAC and drains. Follow-up with [facility] in 10 days Return to the emergency room for fevers increasing abdominal pain or nausea and vomiting.

## 2017-01-10 NOTE — Care Management (Signed)
Patient to discharge home today.  Patient has been transitioned to [facility], and will discharge with remaining supplies.  [facility]ne Cornea with [facility] notified of discharge.  Patient new PCP appointment for 5/25 and is on discharge AVS.  RNCM signing off

## 2017-01-10 NOTE — Progress Notes (Signed)
Patient discharge teaching given, including activity, diet, follow-up appoints, and medications. Home health was set up. Patient verbalized understanding of all discharge instructions. RN went over how to flush catheter , pt demonstrated with teach back. IV access was d/c'd. Vitals are stable. Skin is intact except as charted in most recent assessments. Pt to be escorted out by volunteer, to be driven home by family.  Travonna Swindle [employer]

## 2017-01-10 NOTE — Progress Notes (Signed)
Anna [name]   DOB:05/05/1966   MR#:[id number]    Subjective: No fever no chills. She is tolerating diet well. No nausea vomiting. Colostomy working well. Patient anticipating discharge today. Objective:  Vitals:   01/09/17 2102 01/10/17 0421  BP: 133/73 127/62  Pulse: (!) 128 (!) 109  Resp: 16 16  Temp: 99.1 F (37.3 C) 98.3 F (36.8 C)     Intake/Output Summary (Last 24 hours) at 01/10/17 1850 Last data filed at 01/10/17 1200  Gross per 24 hour  Intake             2065 ml  Output             1730 ml  Net              335 ml     GENERAL: Well-nourished well-developed; Alert, no distress and comfortable. Accompanied by her friend. EYES: no pallor or icterus OROPHARYNX: no thrush or ulceration. NECK: supple, no masses felt LUNGS: decreased breath sounds to auscultation at bases and  No wheeze or crackles HEART/CVS: regular rate & rhythm and no murmurs; No lower extremity edema ABDOMEN: abdomen soft, non-tender and colostomy in place.  Musculoskeletal:no cyanosis of digits and no clubbing  PSYCH: alert & oriented x 3 with fluent speech NEURO: no focal motor/sensory deficits SKIN:  no rashes or significant lesions  Labs:  Lab Results  Component Value Date   WBC 11.8 (H) 01/10/2017   HGB 7.9 (L) 01/10/2017   HCT 23.3 (L) 01/10/2017   MCV 91.7 01/10/2017   PLT 1,029 (HH) 01/10/2017   NEUTROABS 10.1 (H) 01/10/2017    Lab Results  Component Value Date   NA 137 01/08/2017   K 3.5 01/08/2017   CL 102 01/08/2017   CO2 29 01/08/2017    Studies:  No results found.  Assessment & Plan:   # 51-year-old female patient status post recent bilateral salpingo-oophorectomy; currently admitted to the hospital for sigmoid bowel perforation.  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 Likely metastatic disease question primary. Awaiting cancer type ID/Foundation  on testing.  # pelvic abscess status post drainage- improving. Antibiotics as per primary team.  # Anemia-7.9- secondary to hospitalization acute infection.  # Thrombocytosis- platelets above 1 million- likely reactive. If not improved and recommend workup for myeloproliferative neoplasm.  # patient will need outpatient workup with a PET scan once the acute issues resolve. We will make appointments for her to follow up in the clinic after discharge.  Govinda R Brahmanday, MD 01/10/2017  6:50 PM   

## 2017-01-10 NOTE — Progress Notes (Signed)
10 Days Post-Op  Subjective: Patient feels well tolerating a regular diet wants to go home today. She feels ready to be discharged.  Objective: Vital signs in last 24 hours: Temp:  [98.3 F (36.8 C)-99.1 F (37.3 C)] 98.3 F (36.8 C) (05/18 0421) Pulse Rate:  [109-128] 109 (05/18 0421) Resp:  [16] 16 (05/18 0421) BP: (127-133)/(62-73) 127/62 (05/18 0421) SpO2:  [100 %] 100 % (05/18 0421) Last BM Date: 01/10/17  Intake/Output from previous day: 05/17 0701 - 05/18 0700 In: 2782.3 [P.O.:720; I.V.:1362.3; IV Piggyback:700] Out: 7989 [Urine:2100; Drains:30; Stool:625] Intake/Output this shift: Total I/O In: 241 [I.V.:241] Out: 400 [Urine:400]  Physical exam:  Heart rate remains slightly tachycardic as it has the entire hospitalization but she is asymptomatic. Afebrile. Abdomen soft wound VAC in place no drainage no erythema ostomy in place and drains in place. Nontender calves  Lab Results: CBC   Recent Labs  01/08/17 0538 01/10/17 0602  WBC 17.6* 11.8*  HGB 7.9* 7.9*  HCT 23.9* 23.3*  PLT 1,042* 1,029*   BMET  Recent Labs  01/08/17 0538  NA 137  K 3.5  CL 102  CO2 29  GLUCOSE 107*  BUN 9  CREATININE 0.57  CALCIUM 7.8*   PT/INR No results for input(s): LABPROT, INR in the last 72 hours. ABG No results for input(s): PHART, HCO3 in the last 72 hours.  Invalid input(s): PCO2, PO2  Studies/Results: Ct Abdomen Pelvis W Contrast  Result Date: 01/08/2017 CLINICAL DATA:  Diverticular abscess. EXAM: CT ABDOMEN AND PELVIS WITH CONTRAST TECHNIQUE: Multidetector CT imaging of the abdomen and pelvis was performed using the standard protocol following bolus administration of intravenous contrast. CONTRAST:  37mL ISOVUE-300 IOPAMIDOL (ISOVUE-300) INJECTION 61% COMPARISON:  CT scan of Jan 05, 2017. FINDINGS: Lower chest: Bibasilar pulmonary nodules are noted concerning for metastatic disease. Mild bibasilar subsegmental atelectasis is noted as well. Hepatobiliary: Probable  sludge is noted within the gallbladder. Stable 1.4 cm low density with irregular margins is noted in the left hepatic lobe which is not changed compared to prior exam. This is concerning for possible metastatic focus. Pancreas: Unremarkable. No pancreatic ductal dilatation or surrounding inflammatory changes. Spleen: Normal in size without focal abnormality. Adrenals/Urinary Tract: Adrenal glands are unremarkable. Kidneys are normal, without renal calculi, focal lesion, or hydronephrosis. Bladder is unremarkable. Stomach/Bowel: Visualized esophagus and stomach appear normal. Mildly dilated proximal small bowel loops are noted concerning for ileus. 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 Status post partial resection of sigmoid colon. Vascular/Lymphatic: No significant vascular findings are present. No enlarged abdominal or pelvic lymph nodes. Reproductive: Multiple small uterine fibroids are noted. Reportedly both ovaries have been removed. Other: There is been interval placement of percutaneous drain into pelvic abscess described on prior exam. The abscess is significantly decreased in size, although it measures 4.3 x 1.8 cm superiorly with 2.9 x 1.0 cm fluid collections seen to the right of the uterus and 2.4 x 1.5 cm fluid collection seen to the left of the uterus. Stable position of surgical drain is seen entering the right anterior abdominal wall, with distal tip in the right lower quadrant. Musculoskeletal: No acute or significant osseous findings. IMPRESSION: Bibasilar pulmonary nodules are again noted concerning for metastatic disease. Probable sludge seen within the gallbladder. Stable 1.4 cm low density noted in left hepatic lobe concerning for possible metastatic disease. 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 Interval placement of percutaneous drain into the pelvic abscess described on prior exam. This abscess is significantly smaller compared to  prior exam. Stable position of surgical drain seen  involving right side of abdomen. Electronically Signed   By: Marijo Conception, M.D.   On: 01/08/2017 09:08    Anti-infectives: Anti-infectives    Start     Dose/Rate Route Frequency Ordered Stop   01/10/17 0000  ciprofloxacin (CIPRO) 500 MG tablet     500 mg Oral 2 times daily 01/10/17 0725     01/10/17 0000  metroNIDAZOLE (FLAGYL) 500 MG tablet     500 mg Oral 3 times daily 01/10/17 0725     01/02/17 0100  metroNIDAZOLE (FLAGYL) IVPB 500 mg     500 mg 100 mL/hr over 60 Minutes Intravenous Every 8 hours 01/01/17 2333     12/31/16 2100  levofloxacin (LEVAQUIN) IVPB 750 mg  Status:  Discontinued     750 mg 100 mL/hr over 90 Minutes Intravenous Every 24 hours 12/30/16 2143 01/02/17 1402   12/31/16 0600  metroNIDAZOLE (FLAGYL) IVPB 500 mg  Status:  Discontinued     500 mg 100 mL/hr over 60 Minutes Intravenous Every 8 hours 12/30/16 2143 01/01/17 2333   12/31/16 0600  ciprofloxacin (CIPRO) IVPB 400 mg     400 mg 200 mL/hr over 60 Minutes Intravenous Every 12 hours 12/31/16 0414     12/31/16 0400  ciprofloxacin (CIPRO) IVPB 400 mg  Status:  Discontinued     400 mg 200 mL/hr over 60 Minutes Intravenous Every 12 hours 12/31/16 0358 12/31/16 0414   12/31/16 0400  metroNIDAZOLE (FLAGYL) IVPB 500 mg  Status:  Discontinued     500 mg 100 mL/hr over 60 Minutes Intravenous Every 8 hours 12/31/16 0358 12/31/16 0415   12/30/16 2130  levofloxacin (LEVAQUIN) IVPB 750 mg     750 mg 100 mL/hr over 90 Minutes Intravenous  Once 12/30/16 2129 12/30/16 2315   12/30/16 2130  metroNIDAZOLE (FLAGYL) IVPB 500 mg     500 mg 100 mL/hr over 60 Minutes Intravenous  Once 12/30/16 2129 12/30/16 2249      Assessment/Plan: s/p Procedure(s): EXPLORATORY LAPAROTOMY, sigmoid colectomy, removal of omentum, colostomy   White blood cell count is nearly normalized 11.8 and improving. Patient feels well tolerating a regular diet wants to be discharged today. We'll switch to oral antibiotic Cipro and Flagyl and oral  analgesics with home health to provide care for her ostomy wound VAC and drain care. Patient may shower and will follow up in our office in 10 days Florene Glen, MD, FACS  01/10/2017

## 2017-01-10 NOTE — Discharge Summary (Signed)
Physician Discharge Summary  Patient ID: Anna [name] MRN: [id number] DOB/AGE: 1966-04-02 51 y.o.  Admit date: 12/30/2016 Discharge date: 01/10/2017   Discharge Diagnoses:  Active Problems:   Dehydration, moderate   Large bowel perforation (HCC)   Abscess of abdominal cavity (HCC)   Procedures:Hartman's procedure, wound VAC change, drainage of pelvic abscess by IR  Hospital Course: This patient with a history of diverticulitis with acute perforation was taken the operating room for Hartman's procedure. A colostomy was performed and a wound VAC was placed. Subsequent to that she developed fevers and a CT scan showed a pelvic abscess which was successfully drained by IR. There is still some mild fluid collections in the area but they have decreased in size. She has been on IV antibiotics for several days. She is tolerating a regular diet. She will be changed to oral antibiotics and discharged today with instructions to follow-up in 10 days and what she should experience fever nausea vomiting or increasing pain. She may shower. She has home health for caring with the wound VAC ostomy and drains.  Consults: Ostomy nurse wound care  Disposition: 01-Home or Self Care   Allergies as of 01/10/2017      Reactions   Penicillins Rash   As a child. Has patient had a PCN reaction causing immediate rash, facial/tongue/throat swelling, SOB or lightheadedness with hypotension: Unknown Has patient had a PCN reaction causing severe rash involving mucus membranes or skin necrosis: Unknown Has patient had a PCN reaction that required hospitalization: No Has patient had a PCN reaction occurring within the last 10 years: No If all of the above answers are "NO", then may proceed with Cephalosporin use.      Medication List    TAKE these medications   acetaminophen 500 MG tablet Commonly known as:  TYLENOL Take 500 mg by mouth as needed for mild pain.   ciprofloxacin 500 MG tablet Commonly  known as:  CIPRO Take 1 tablet (500 mg total) by mouth 2 (two) times daily.   estradiol 0.025 MG/24HR Commonly known as:  VIVELLE-DOT Place 1 patch onto the skin 2 (two) times a week.   HYDROcodone-acetaminophen 5-325 MG tablet Commonly known as:  NORCO/VICODIN Take 1 tablet by mouth every 6 (six) hours as needed for moderate pain.   metroNIDAZOLE 500 MG tablet Commonly known as:  FLAGYL Take 1 tablet (500 mg total) by mouth 3 (three) times daily.   naproxen 500 MG tablet Commonly known as:  NAPROSYN Take 500 mg by mouth 2 (two) times daily with a meal.   oxyCODONE-acetaminophen 5-325 MG tablet Commonly known as:  ROXICET Take 1 tablet by mouth every 6 (six) hours as needed.      Follow-up Information    McLean-Scocuzza, Nino Glow, MD Follow up on 01/17/2017.   Specialty:  Internal Medicine Why:  Arrive 10 am Bring insurance card, discharge information from hospital, and any home medication Contact information: [place] [address] [phone]        McLean-Scocuzza, Nino Glow, MD Follow up.   Specialty:  Internal Medicine Contact information: [address] [place] [address] [phone]        Schermerhorn, Gwen Her, MD Follow up.   Specialty:  Obstetrics and Gynecology Contact information: [place] [address] [phone]        Florene Glen, MD Follow up in 10 day(s).   Specialty:  Surgery Contact information: [address]  [place], MD, FACS

## 2017-01-10 NOTE — Consult Note (Signed)
[place] Nurse wound follow up Discharging today home with [facility].  Will switch over to MEdela NPWT (foam) system and perform last pouch change/teaching.   Wound type:Surgical wound Pressure Injury POA: N/a Measurement: 16 cm x 4 cm x 2.2 cm  Wound JFH:LKTG pink nongranulating, adipose tissue present.  Drainage (amount, consistency, odor) minimal serosanguinous.  Periwound:intact.  LLQ Colostomy present Dressing procedure/placement/frequency:Wuond VAC. Change MON/WED/Fri. WOC Nurse ostomy follow up Stoma type/location: LLQ Colostomy Stomal assessment/size: 1 1/4"  Peristomal assessment: intact Treatment options for stomal/peristomal skin: barrier ring Output soft brown stool Ostomy pouching: 2pc. 2 1/4' pouch with barrier ring  Education provided: POuch change and dressing change performed today.  Patient tolerated well.  Enrolled patient in [facility] Start Discharge program: Yes Patient independent with emptying.  Will need ongoing teaching with pouch changes due to close proximity of midline surgical wound.   Will not follow at this time.  Please re-consult if needed.  Domenic Moras RN BSN [place] Pager [phone]

## 2017-01-11 DIAGNOSIS — Z859 Personal history of malignant neoplasm, unspecified: Secondary | ICD-10-CM | POA: Insufficient documentation

## 2017-01-11 DIAGNOSIS — Z4801 Encounter for change or removal of surgical wound dressing: Secondary | ICD-10-CM | POA: Insufficient documentation

## 2017-01-12 ENCOUNTER — Emergency Department
Admission: EM | Admit: 2017-01-12 | Discharge: 2017-01-12 | Disposition: A | Discharge status: Home / Self Care | Payer: Managed Care, Other (non HMO) | Attending: Emergency Medicine | Admitting: Emergency Medicine

## 2017-01-12 DIAGNOSIS — Z4889 Encounter for other specified surgical aftercare: Secondary | ICD-10-CM

## 2017-01-12 NOTE — ED Notes (Signed)
Patient up to restroom and back to recliner in triage 1 without difficulty or distress noted.  Wound vac continues to work properly at this time.

## 2017-01-12 NOTE — ED Triage Notes (Signed)
Patient with recent discharge from hospital after surgery with removal of part of colon.  Reports her wound vac has been beeping all day and saying air leak.

## 2017-01-12 NOTE — ED Provider Notes (Signed)
[facility] Emergency Department Provider Note   First MD Initiated Contact with Patient 01/12/17 0100     (approximate)  I have reviewed the triage vital signs and the nursing notes.   HISTORY  Chief Complaint Wound Check    HPI Anna [name] is a 51 y.o. female with blow list of chronic medical conditions including exploratory laparotomy performed by Dr. Dahlia Byes on 12/31/2016 presents to the emergency department with wound VAC application. Patient states that the wound VAC is been sitting and has an air leak all day. Patient states that she has home health scheduled to come out tomorrow. Patient denies any other complaints no abdominal pain no fever.   Past Medical History:  Diagnosis Date  . Cancer ([place])   . Cancer with unknown primary site [facility])   . Colon polyp   . Diverticulosis   . Fibroid uterus   . HH (hiatus hernia)     Patient Active Problem List   Diagnosis Date Noted  . Large bowel perforation ([place]) 12/31/2016  . Perforation of sigmoid colon ([place])   . Abscess of abdominal cavity ([place])   . Dehydration, moderate 12/30/2016  . Metastatic adenocarcinoma ([place]) 12/20/2016    Past Surgical History:  Procedure Laterality Date  . COLONOSCOPY    . CYSTOSCOPY  12/20/2016   Procedure: CYSTOSCOPY;  Surgeon: Boykin Nearing, MD;  Location: [facility] ORS;  Service: Gynecology;;  . ESOPHAGOGASTRODUODENOSCOPY    . LAPAROSCOPIC BILATERAL SALPINGECTOMY Bilateral 12/20/2016   Procedure: LAPAROSCOPIC BILATERAL SALPINGOOPHERECTOMY,  Excision of anterior 5 cm calcified uterine fibroid;  Surgeon: Boykin Nearing, MD;  Location: [facility] ORS;  Service: Gynecology;  Laterality: Bilateral;  . LAPAROSCOPIC LYSIS OF ADHESIONS  12/20/2016   Procedure: LAPAROSCOPIC LYSIS OF ADHESIONS WITH DISSECTION OF LEFT TUBE AND OVARY.;  Surgeon: Olean Ree, MD;  Location: [facility] ORS;  Service: General;;  . LAPAROTOMY N/A 12/31/2016   Procedure: EXPLORATORY LAPAROTOMY,  sigmoid colectomy, removal of omentum, colostomy;  Surgeon: Jules Husbands, MD;  Location: [facility] ORS;  Service: General;  Laterality: N/A;  . SIGMOIDOSCOPY  12/20/2016   Procedure: RIGID SIGMOIDOSCOPY;  Surgeon: Olean Ree, MD;  Location: [facility] ORS;  Service: General;;  . TONSILLECTOMY      Prior to Admission medications   Medication Sig Start Date End Date Taking? Authorizing Provider  acetaminophen (TYLENOL) 500 MG tablet Take 500 mg by mouth as needed for mild pain.    [provider]  ciprofloxacin (CIPRO) 500 MG tablet Take 1 tablet (500 mg total) by mouth 2 (two) times daily. 01/10/17   Florene Glen, MD  estradiol (VIVELLE-DOT) 0.025 MG/24HR Place 1 patch onto the skin 2 (two) times a week.    [provider]  HYDROcodone-acetaminophen (NORCO/VICODIN) 5-325 MG tablet Take 1 tablet by mouth every 6 (six) hours as needed for moderate pain.    [provider]  metroNIDAZOLE (FLAGYL) 500 MG tablet Take 1 tablet (500 mg total) by mouth 3 (three) times daily. 01/10/17   Florene Glen, MD  naproxen (NAPROSYN) 500 MG tablet Take 500 mg by mouth 2 (two) times daily with a meal.    [provider]  oxyCODONE-acetaminophen (ROXICET) 5-325 MG tablet Take 1 tablet by mouth every 6 (six) hours as needed. 01/10/17 01/10/18  Florene Glen, MD    Allergies Penicillins  Family History  Problem Relation Age of Onset  . Colon cancer Mother 75  . Diabetes Mother   . Asthma Father   . Breast  cancer Neg Hx     Social History Social History  Substance Use Topics  . Smoking status: Never Smoker  . Smokeless tobacco: Never Used  . Alcohol use No    Review of Systems Constitutional: No fever/chills Eyes: No visual changes. ENT: No sore throat. Cardiovascular: Denies chest pain. Respiratory: Denies shortness of breath. Gastrointestinal: No abdominal pain.  No nausea, no vomiting.  No diarrhea.  No constipation. Genitourinary: Negative for  dysuria. Musculoskeletal: Negative for neck pain.  Negative for back pain. Integumentary: Negative for rash. Neurological: Negative for headaches, focal weakness or numbness.   ____________________________________________   PHYSICAL EXAM:  VITAL SIGNS: ED Triage Vitals [01/12/17 0011]  Enc Vitals Group     BP 134/76     Pulse Rate (!) 123     Resp 20     Temp 99.8 F (37.7 C)     Temp Source Oral     SpO2 97 %     Weight      Height      Head Circumference      Peak Flow      Pain Score      Pain Loc      Pain Edu?      Excl. in [place]?     Constitutional: Alert and oriented. Well appearing and in no acute distress. Eyes: Conjunctivae are normal.  Head: Atraumatic. Mouth/Throat: Mucous membranes are moist. Neck: No stridor.   Cardiovascular: Normal rate, regular rhythm. Good peripheral circulation. Grossly normal heart sounds. Respiratory: Normal respiratory effort.  No retractions. Lungs CTAB. Gastrointestinal: Soft and nontender. No distention.  Musculoskeletal: No lower extremity tenderness nor edema. No gross deformities of extremities. Neurologic:  Normal speech and language. No gross focal neurologic deficits are appreciated.  Skin:  Skin is warm, dry and intact. No rash noted. Psychiatric: Mood and affect are normal. Speech and behavior are normal.    Procedures   ____________________________________________   INITIAL IMPRESSION / ASSESSMENT AND PLAN / ED COURSE  Pertinent labs & imaging results that were available during my care of the patient were reviewed by me and considered in my medical decision making (see chart for details).  Nursing staff adjusted the patient's wound VAC and applied a new dressing with resolution of air leak notification.      ____________________________________________  FINAL CLINICAL IMPRESSION(S) / ED DIAGNOSES  Final diagnoses:  Encounter for postoperative wound check     MEDICATIONS GIVEN DURING THIS  VISIT:  Medications - No data to display   NEW OUTPATIENT MEDICATIONS STARTED DURING THIS VISIT:  New Prescriptions   No medications on file    Modified Medications   No medications on file    Discontinued Medications   No medications on file     Note:  This document was prepared using Dragon voice recognition software and may include unintentional dictation errors.    Gregor Hams, MD 01/12/17 [phone]

## 2017-01-12 NOTE — ED Notes (Signed)
Patient to ED because her wound vac to abdomen has been beeping and saying air leak off and on all day.  When spoke to home health agency they were instructed to redress wound and they would send someone to check on her later in the day.  Tamara from 2-C down and looking at wound and wound vac with this nurse.  Area noted in dressing that appears could be source of the leak, tegaderm placed.

## 2017-01-13 DIAGNOSIS — R102 Pelvic and perineal pain: Secondary | ICD-10-CM

## 2017-01-13 HISTORY — DX: Pelvic and perineal pain: R10.2

## 2017-01-14 ENCOUNTER — Telehealth: Payer: Self-pay

## 2017-01-14 NOTE — Telephone Encounter (Addendum)
[facility] Nurse from [facility]) called in at this time and is wanting confirmation on flushing and wound vac suction directions.  I spoke with Dr. Hampton Abbot at this time. Home Health Nurse was given the following instructions: Flush Drain with 28ml twice daily if thick drainage and once daily if drainage is thin. Wound vac being used is Medela and settings per Surgeon are 71mm Hg Continuous suction.  Home Health Nurse read back instructions and verbalized understanding of everything being told.  Patient will follow-up in office on 01/22/17 as previously scheduled.

## 2017-01-17 ENCOUNTER — Encounter: Payer: Self-pay | Admitting: Internal Medicine

## 2017-01-18 ENCOUNTER — Encounter: Payer: Self-pay | Admitting: Internal Medicine

## 2017-01-20 ENCOUNTER — Telehealth: Payer: Self-pay | Admitting: Internal Medicine

## 2017-01-20 NOTE — Telephone Encounter (Signed)
Please inform patient- that I would like to see her on May 30th 2:00; please get CBC; CMP; iron studies ferritin; and CRP; hold tube.   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 Please check if she can gyn-onc can see her too on this Wednesday/ 30h.

## 2017-01-21 ENCOUNTER — Encounter: Payer: Self-pay | Admitting: Emergency Medicine

## 2017-01-21 ENCOUNTER — Telehealth: Payer: Self-pay

## 2017-01-21 ENCOUNTER — Inpatient Hospital Stay
Admission: EM | Admit: 2017-01-21 | Discharge: 2017-01-24 | DRG: 871 | Disposition: A | Discharge status: Home Health | Payer: Managed Care, Other (non HMO) | Attending: Internal Medicine | Admitting: Internal Medicine

## 2017-01-21 ENCOUNTER — Emergency Department: Payer: Managed Care, Other (non HMO)

## 2017-01-21 ENCOUNTER — Other Ambulatory Visit: Payer: Self-pay | Admitting: *Deleted

## 2017-01-21 DIAGNOSIS — N739 Female pelvic inflammatory disease, unspecified: Secondary | ICD-10-CM | POA: Diagnosis not present

## 2017-01-21 DIAGNOSIS — Z880 Allergy status to penicillin: Secondary | ICD-10-CM | POA: Diagnosis not present

## 2017-01-21 DIAGNOSIS — R000 Tachycardia, unspecified: Secondary | ICD-10-CM

## 2017-01-21 DIAGNOSIS — E876 Hypokalemia: Secondary | ICD-10-CM | POA: Diagnosis present

## 2017-01-21 DIAGNOSIS — Z79899 Other long term (current) drug therapy: Secondary | ICD-10-CM | POA: Diagnosis not present

## 2017-01-21 DIAGNOSIS — Z7901 Long term (current) use of anticoagulants: Secondary | ICD-10-CM | POA: Diagnosis not present

## 2017-01-21 DIAGNOSIS — Z79890 Hormone replacement therapy (postmenopausal): Secondary | ICD-10-CM | POA: Diagnosis not present

## 2017-01-21 DIAGNOSIS — C7989 Secondary malignant neoplasm of other specified sites: Secondary | ICD-10-CM | POA: Diagnosis present

## 2017-01-21 DIAGNOSIS — C786 Secondary malignant neoplasm of retroperitoneum and peritoneum: Secondary | ICD-10-CM | POA: Diagnosis not present

## 2017-01-21 DIAGNOSIS — Z9049 Acquired absence of other specified parts of digestive tract: Secondary | ICD-10-CM | POA: Diagnosis not present

## 2017-01-21 DIAGNOSIS — K6289 Other specified diseases of anus and rectum: Secondary | ICD-10-CM | POA: Diagnosis not present

## 2017-01-21 DIAGNOSIS — I2699 Other pulmonary embolism without acute cor pulmonale: Secondary | ICD-10-CM | POA: Diagnosis not present

## 2017-01-21 DIAGNOSIS — Z86010 Personal history of colonic polyps: Secondary | ICD-10-CM | POA: Diagnosis not present

## 2017-01-21 DIAGNOSIS — C799 Secondary malignant neoplasm of unspecified site: Secondary | ICD-10-CM

## 2017-01-21 DIAGNOSIS — C562 Malignant neoplasm of left ovary: Secondary | ICD-10-CM | POA: Diagnosis present

## 2017-01-21 DIAGNOSIS — C785 Secondary malignant neoplasm of large intestine and rectum: Secondary | ICD-10-CM | POA: Diagnosis present

## 2017-01-21 DIAGNOSIS — E871 Hypo-osmolality and hyponatremia: Secondary | ICD-10-CM | POA: Diagnosis present

## 2017-01-21 DIAGNOSIS — K650 Generalized (acute) peritonitis: Secondary | ICD-10-CM | POA: Diagnosis not present

## 2017-01-21 DIAGNOSIS — I82442 Acute embolism and thrombosis of left tibial vein: Secondary | ICD-10-CM | POA: Diagnosis present

## 2017-01-21 DIAGNOSIS — C801 Malignant (primary) neoplasm, unspecified: Secondary | ICD-10-CM | POA: Diagnosis not present

## 2017-01-21 DIAGNOSIS — K651 Peritoneal abscess: Secondary | ICD-10-CM | POA: Diagnosis present

## 2017-01-21 DIAGNOSIS — D72829 Elevated white blood cell count, unspecified: Secondary | ICD-10-CM

## 2017-01-21 DIAGNOSIS — N289 Disorder of kidney and ureter, unspecified: Secondary | ICD-10-CM | POA: Diagnosis present

## 2017-01-21 DIAGNOSIS — L270 Generalized skin eruption due to drugs and medicaments taken internally: Secondary | ICD-10-CM | POA: Diagnosis not present

## 2017-01-21 DIAGNOSIS — Z800 Family history of malignant neoplasm of digestive organs: Secondary | ICD-10-CM | POA: Diagnosis not present

## 2017-01-21 DIAGNOSIS — Z8719 Personal history of other diseases of the digestive system: Secondary | ICD-10-CM | POA: Diagnosis not present

## 2017-01-21 DIAGNOSIS — Z90722 Acquired absence of ovaries, bilateral: Secondary | ICD-10-CM

## 2017-01-21 DIAGNOSIS — Z9079 Acquired absence of other genital organ(s): Secondary | ICD-10-CM

## 2017-01-21 DIAGNOSIS — A419 Sepsis, unspecified organism: Principal | ICD-10-CM | POA: Diagnosis present

## 2017-01-21 DIAGNOSIS — C7802 Secondary malignant neoplasm of left lung: Secondary | ICD-10-CM | POA: Diagnosis present

## 2017-01-21 DIAGNOSIS — K9281 Gastrointestinal mucositis (ulcerative): Secondary | ICD-10-CM | POA: Diagnosis present

## 2017-01-21 DIAGNOSIS — R509 Fever, unspecified: Secondary | ICD-10-CM | POA: Diagnosis present

## 2017-01-21 DIAGNOSIS — C7801 Secondary malignant neoplasm of right lung: Secondary | ICD-10-CM | POA: Diagnosis present

## 2017-01-21 DIAGNOSIS — Z9289 Personal history of other medical treatment: Secondary | ICD-10-CM

## 2017-01-21 DIAGNOSIS — K5790 Diverticulosis of intestine, part unspecified, without perforation or abscess without bleeding: Secondary | ICD-10-CM | POA: Diagnosis not present

## 2017-01-21 DIAGNOSIS — K449 Diaphragmatic hernia without obstruction or gangrene: Secondary | ICD-10-CM | POA: Diagnosis not present

## 2017-01-21 DIAGNOSIS — T361X5A Adverse effect of cephalosporins and other beta-lactam antibiotics, initial encounter: Secondary | ICD-10-CM | POA: Diagnosis not present

## 2017-01-21 DIAGNOSIS — R9389 Abnormal findings on diagnostic imaging of other specified body structures: Secondary | ICD-10-CM

## 2017-01-21 DIAGNOSIS — C787 Secondary malignant neoplasm of liver and intrahepatic bile duct: Secondary | ICD-10-CM | POA: Diagnosis not present

## 2017-01-21 DIAGNOSIS — Z933 Colostomy status: Secondary | ICD-10-CM

## 2017-01-21 LAB — COMPREHENSIVE METABOLIC PANEL
ALK PHOS: 56 U/L (ref 38–126)
ALT: 12 U/L — ABNORMAL LOW (ref 14–54)
AST: 27 U/L (ref 15–41)
Albumin: 2.9 g/dL — ABNORMAL LOW (ref 3.5–5.0)
Anion gap: 10 (ref 5–15)
BILIRUBIN TOTAL: 0.6 mg/dL (ref 0.3–1.2)
BUN: 14 mg/dL (ref 6–20)
CO2: 32 mmol/L (ref 22–32)
Calcium: 8.3 mg/dL — ABNORMAL LOW (ref 8.9–10.3)
Chloride: 87 mmol/L — ABNORMAL LOW (ref 101–111)
Creatinine, Ser: 0.46 mg/dL (ref 0.44–1.00)
GFR calc Af Amer: >60 mL/min (ref >60)
GFR calc non Af Amer: >60 mL/min (ref >60)
Glucose, Bld: 112 mg/dL — ABNORMAL HIGH (ref 65–99)
POTASSIUM: 2.6 mmol/L — AB (ref 3.5–5.1)
Sodium: 129 mmol/L — ABNORMAL LOW (ref 135–145)
TOTAL PROTEIN: 6.6 g/dL (ref 6.5–8.1)

## 2017-01-21 LAB — URINALYSIS, COMPLETE (UACMP) WITH MICROSCOPIC
BACTERIA UA: NONE SEEN
Bilirubin Urine: NEGATIVE
Glucose, UA: NEGATIVE mg/dL
Hgb urine dipstick: NEGATIVE
KETONES UR: 20 mg/dL — AB
Nitrite: NEGATIVE
PROTEIN: NEGATIVE mg/dL
Specific Gravity, Urine: 1.012 (ref 1.005–1.030)
pH: 6 (ref 5.0–8.0)

## 2017-01-21 LAB — CBC WITH DIFFERENTIAL/PLATELET
BASOS PCT: 0 %
Basophils Absolute: 0.1 10*3/uL (ref 0–0.1)
Eosinophils Absolute: 0.2 10*3/uL (ref 0–0.7)
Eosinophils Relative: 1 %
HEMATOCRIT: 29.9 % — AB (ref 35.0–47.0)
Hemoglobin: 9.8 g/dL — ABNORMAL LOW (ref 12.0–16.0)
Lymphocytes Relative: 3 %
Lymphs Abs: 0.8 10*3/uL — ABNORMAL LOW (ref 1.0–3.6)
MCH: 29.6 pg (ref 26.0–34.0)
MCHC: 32.7 g/dL (ref 32.0–36.0)
MCV: 90.4 fL (ref 80.0–100.0)
MONO ABS: 1.1 10*3/uL — AB (ref 0.2–0.9)
MONOS PCT: 5 %
NEUTROS ABS: 22.6 10*3/uL — AB (ref 1.4–6.5)
Neutrophils Relative %: 91 %
Platelets: 678 10*3/uL — ABNORMAL HIGH (ref 150–440)
RBC: 3.31 MIL/uL — ABNORMAL LOW (ref 3.80–5.20)
RDW: 16.7 % — AB (ref 11.5–14.5)
WBC: 24.8 10*3/uL — ABNORMAL HIGH (ref 3.6–11.0)

## 2017-01-21 LAB — PROTIME-INR
INR: 1.12
Prothrombin Time: 14.5 seconds (ref 11.4–15.2)

## 2017-01-21 LAB — LACTIC ACID, PLASMA: Lactic Acid, Venous: 1.3 mmol/L (ref 0.5–1.9)

## 2017-01-21 MED ORDER — DEXTROSE 5 % IV SOLN: 2.0000 g | Freq: Three times a day (TID) | INTRAVENOUS | Status: DC

## 2017-01-21 MED ORDER — POTASSIUM CHLORIDE CRYS ER 20 MEQ PO TBCR
40.0000 meq | EXTENDED_RELEASE_TABLET | Freq: Two times a day (BID) | ORAL | Status: AC
  Administered 2017-01-21 – 2017-01-22 (×2): 40 meq via ORAL
  Filled 2017-01-21 (×2): qty 2

## 2017-01-21 MED ORDER — DEXTROSE 5 % IV SOLN
2.0000 g | Freq: Three times a day (TID) | INTRAVENOUS | Status: DC
  Filled 2017-01-21 (×2): qty 2

## 2017-01-21 MED ORDER — METRONIDAZOLE IN NACL 5-0.79 MG/ML-% IV SOLN
500.0000 mg | Freq: Once | INTRAVENOUS | Status: AC
  Administered 2017-01-21: 500 mg via INTRAVENOUS
  Filled 2017-01-21: qty 100

## 2017-01-21 MED ORDER — METRONIDAZOLE IN NACL 5-0.79 MG/ML-% IV SOLN
500.0000 mg | Freq: Three times a day (TID) | INTRAVENOUS | Status: DC
  Administered 2017-01-22 (×2): 500 mg via INTRAVENOUS
  Filled 2017-01-21 (×5): qty 100

## 2017-01-21 MED ORDER — SODIUM CHLORIDE 0.9 % IV BOLUS (SEPSIS)
1000.0000 mL | Freq: Once | INTRAVENOUS | Status: AC
  Administered 2017-01-21: 1000 mL via INTRAVENOUS

## 2017-01-21 MED ORDER — IOPAMIDOL (ISOVUE-300) INJECTION 61%
15.0000 mL | INTRAVENOUS | Status: AC
  Administered 2017-01-21: 30 mL via ORAL

## 2017-01-21 MED ORDER — ENOXAPARIN SODIUM 40 MG/0.4ML ~~LOC~~ SOLN
40.0000 mg | SUBCUTANEOUS | Status: DC
  Administered 2017-01-21 – 2017-01-22 (×2): 40 mg via SUBCUTANEOUS
  Filled 2017-01-21 (×2): qty 0.4

## 2017-01-21 MED ORDER — LEVOFLOXACIN IN D5W 750 MG/150ML IV SOLN
750.0000 mg | Freq: Once | INTRAVENOUS | Status: DC
  Filled 2017-01-21: qty 150

## 2017-01-21 MED ORDER — IOPAMIDOL (ISOVUE-300) INJECTION 61%
100.0000 mL | Freq: Once | INTRAVENOUS | Status: AC | PRN
  Administered 2017-01-21: 100 mL via INTRAVENOUS

## 2017-01-21 MED ORDER — DIPHENHYDRAMINE HCL 25 MG PO CAPS
25.0000 mg | ORAL_CAPSULE | Freq: Four times a day (QID) | ORAL | Status: DC | PRN
  Administered 2017-01-21: 22:00:00 25 mg via ORAL
  Filled 2017-01-21: qty 1

## 2017-01-21 MED ORDER — ACETAMINOPHEN 500 MG PO TABS: 500.0000 mg | ORAL_TABLET | ORAL | Status: DC | PRN

## 2017-01-21 MED ORDER — DEXTROSE 5 % IV SOLN
2.0000 g | INTRAVENOUS | Status: AC
  Administered 2017-01-21: 2 g via INTRAVENOUS
  Filled 2017-01-21: qty 2

## 2017-01-21 MED ORDER — ACETAMINOPHEN 325 MG PO TABS: 650.0000 mg | ORAL_TABLET | Freq: Four times a day (QID) | ORAL | Status: DC | PRN

## 2017-01-21 MED ORDER — OXYCODONE-ACETAMINOPHEN 5-325 MG PO TABS
1.0000 | ORAL_TABLET | Freq: Four times a day (QID) | ORAL | Status: DC | PRN
  Administered 2017-01-22: 1 via ORAL
  Filled 2017-01-21: qty 1

## 2017-01-21 MED ORDER — METOPROLOL SUCCINATE ER 25 MG PO TB24
25.0000 mg | ORAL_TABLET | Freq: Every day | ORAL | Status: DC
  Administered 2017-01-22 – 2017-01-24 (×3): 25 mg via ORAL
  Filled 2017-01-21 (×3): qty 1

## 2017-01-21 MED ORDER — ACETAMINOPHEN 650 MG RE SUPP: 650.0000 mg | Freq: Four times a day (QID) | RECTAL | Status: DC | PRN

## 2017-01-21 MED ORDER — CEFEPIME-DEXTROSE 1 GM/50ML IV SOLR
INTRAVENOUS | Status: AC
  Filled 2017-01-21: qty 50

## 2017-01-21 MED ORDER — TRAMADOL HCL 50 MG PO TABS: 50.0000 mg | ORAL_TABLET | Freq: Four times a day (QID) | ORAL | Status: DC | PRN

## 2017-01-21 NOTE — ED Notes (Addendum)
Pt was seen here for perf bowel and has 6inches resected. Pt saw Dr. Dahlia Byes and Dr. Adonis Huguenin. Pt has JP drain to R lower quad and colostomy to L lower quad. Pt has elevated HR, started on 25mg  metoprolol few days ago. Pt has low grade fever at home. Pt is alert and oriented. Really only c/o is fatigue. Pt able to move self around.   Husband states drainage has decreased from drains. 2 sites for drains. Upper site has some redness around site. Lower site appears good. Husband states "leakage" from upper drain tubing while flushing it. Drainage appears yellow watery but thin. Denies thick drainage.   Stool noted to colostomy bag. Brown in color.   Pt abd tender to touch.

## 2017-01-21 NOTE — Telephone Encounter (Signed)
I called Dr. Dahlia Byes to let him know that the patient was told to go to the emergency room and that they would let him know when she arrived. Dr. Dahlia Byes agreed with the decision.

## 2017-01-21 NOTE — ED Provider Notes (Signed)
[facility] Emergency Department Provider Note  ____________________________________________  Time seen: Approximately 1:25 PM  I have reviewed the triage vital signs and the nursing notes.   HISTORY  Chief Complaint Fever and Abnormal Lab    HPI Anna [name] is a 51 y.o. female reports being sent to the emergency department for evaluation due to having a low-grade fever of 99-100 for the last 24 hours, tachycardia to 120-130 and being found on outpatient labs today to have a white blood cell count of 17,000.  She has a history of suspected ovarian cancer being incidentally found during surgery, then further complicated by erosion into the intestine causing a colonic perforation resulting in emergency surgery. This was further complicated by intra-abdominal abscesses requiring interventional radiology placed drains. She has follow-up with surgery, oncology, and gyn-onc tomorrow in clinic.  She denies any chest pain shortness of breath or other pain complaints. No abdominal pain. No dizziness or syncope. She just reports feeling more tired than usual over the last couple of days. Feels like she is eating and drinking normally. Wound VAC is not alarming and is being changed routinely. Her JP drains are draining clear yellowish fluid. She is flushing them daily as instructed. Output is not cloudy or thick or increasing.     Past Medical History:  Diagnosis Date  . Cancer ([place])   . Cancer with unknown primary site [facility])   . Colon polyp   . Diverticulosis   . Fibroid uterus   . Heme positive stool 08/01/2014  . HH (hiatus hernia)   . Large bowel perforation ([place]) 12/31/2016  . 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 Pelvic pain in female 01/13/2017  . Perforation of sigmoid colon [facility])      Patient Active Problem List   Diagnosis Date Noted  . Pelvic pain in female 01/13/2017  . Large bowel perforation ([place]) 12/31/2016  . Perforation of  sigmoid colon ([place])   . Abscess of abdominal cavity ([place])   . Dehydration, moderate 12/30/2016  . 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 Heme positive stool 08/01/2014     Past Surgical History:  Procedure Laterality Date  . COLONOSCOPY    . CYSTOSCOPY  12/20/2016   Procedure: CYSTOSCOPY;  Surgeon: Boykin Nearing, MD;  Location: [facility] ORS;  Service: Gynecology;;  . ESOPHAGOGASTRODUODENOSCOPY    . LAPAROSCOPIC BILATERAL SALPINGECTOMY Bilateral 12/20/2016   Procedure: LAPAROSCOPIC BILATERAL SALPINGOOPHERECTOMY,  Excision of anterior 5 cm calcified uterine fibroid;  Surgeon: Boykin Nearing, MD;  Location: [facility] ORS;  Service: Gynecology;  Laterality: Bilateral;  . LAPAROSCOPIC LYSIS OF ADHESIONS  12/20/2016   Procedure: LAPAROSCOPIC LYSIS OF ADHESIONS WITH DISSECTION OF LEFT TUBE AND OVARY.;  Surgeon: Olean Ree, MD;  Location: [facility] ORS;  Service: General;;  . LAPAROTOMY N/A 12/31/2016   Procedure: EXPLORATORY LAPAROTOMY, sigmoid colectomy, removal of omentum, colostomy;  Surgeon: Jules Husbands, MD;  Location: [facility] ORS;  Service: General;  Laterality: N/A;  . SIGMOIDOSCOPY  12/20/2016   Procedure: RIGID SIGMOIDOSCOPY;  Surgeon: Olean Ree, MD;  Location: [facility] ORS;  Service: General;;  . TONSILLECTOMY       Prior to Admission medications   Medication Sig Start Date End Date Taking? Authorizing Provider  acetaminophen (TYLENOL) 500 MG tablet Take 500 mg by mouth as needed for mild pain.   Yes [provider]  ciprofloxacin (CIPRO) 500 MG tablet Take 1 tablet (500 mg total) by mouth 2 (two) times daily. 01/10/17  Yes Florene Glen, MD  metoprolol  succinate (TOPROL-XL) 25 MG 24 hr tablet Take 25 mg by mouth daily. 01/17/17  Yes [provider]  metroNIDAZOLE (FLAGYL) 500 MG tablet Take 1 tablet (500 mg total) by mouth 3 (three) times daily. 01/10/17  Yes Florene Glen, MD  oxyCODONE-acetaminophen (ROXICET) 5-325 MG tablet Take 1 tablet by mouth every  6 (six) hours as needed. 01/10/17 01/10/18 Yes Florene Glen, MD  estradiol (VIVELLE-DOT) 0.025 MG/24HR Place 1 patch onto the skin 2 (two) times a week.    [provider]     Allergies Penicillins   Family History  Problem Relation Age of Onset  . Colon cancer Mother 1  . Diabetes Mother   . Asthma Father   . Breast cancer Neg Hx     Social History Social History  Substance Use Topics  . Smoking status: Never Smoker  . Smokeless tobacco: Never Used  . Alcohol use No    Review of Systems  Constitutional:   Positive fever and chills.  ENT:   No sore throat. No rhinorrhea. Cardiovascular:   No chest pain or syncope. Respiratory:   No dyspnea or cough. Gastrointestinal:   Negative for abdominal pain, vomiting and diarrhea.  Musculoskeletal:   Negative for focal pain or swelling All other systems reviewed and are negative except as documented above in ROS and HPI.  ____________________________________________   PHYSICAL EXAM:  VITAL SIGNS: ED Triage Vitals  Enc Vitals Group     BP 01/21/17 1210 135/86     Pulse Rate 01/21/17 1210 (!) 133     Resp 01/21/17 1210 20     Temp 01/21/17 1210 99 F (37.2 C)     Temp Source 01/21/17 1210 Oral     SpO2 01/21/17 1210 97 %     Weight 01/21/17 1211 136 lb (61.7 kg)     Height 01/21/17 1211 5\' 3"  (1.6 m)     Head Circumference --      Peak Flow --      Pain Score --      Pain Loc --      Pain Edu? --      Excl. in [place]? --     Vital signs reviewed, nursing assessments reviewed.   Constitutional:   Alert and oriented. Not in distress Eyes:   No scleral icterus. No conjunctival pallor. PERRL. EOMI.  No nystagmus. ENT   Head:   Normocephalic and atraumatic.   Nose:   No congestion/rhinnorhea.    Mouth/Throat:   MMM, no pharyngeal erythema. No peritonsillar mass.    Neck:   No meningismus. Full ROM Hematological/Lymphatic/Immunilogical:   No cervical lymphadenopathy. Cardiovascular:    Tachycardia heart rate 120. Symmetric bilateral radial and DP pulses.  No murmurs.  Respiratory:   Normal respiratory effort without tachypnea/retractions. Breath sounds are clear and equal bilaterally. No wheezes/rales/rhonchi. Gastrointestinal:   Soft with mild generalized tenderness. 2 JP drain skin exit sites are present, one in the right mid abdomen and one in the suprapubic area. There is a wound VAC in place over a midline abdominal incision. No surrounding inflammatory changes. No purulent drainage through any of the wounds. JP drain output is straw-colored thin and watery. Non distended. There is no CVA tenderness.  No rebound, rigidity, or guarding. Genitourinary:   deferred Musculoskeletal:   Normal range of motion in all extremities. No joint effusions.  No lower extremity tenderness.  No edema. Neurologic:   Normal speech and language.   Motor grossly intact. No gross focal  neurologic deficits are appreciated.  Skin:    Skin is warm, dry and intact. No rash noted.  No petechiae, purpura, or bullae.  ____________________________________________    LABS (pertinent positives/negatives) (all labs ordered are listed, but only abnormal results are displayed) Labs Reviewed  COMPREHENSIVE METABOLIC PANEL - Abnormal; Notable for the following:       Result Value   Sodium 129 (*)    Potassium 2.6 (*)    Chloride 87 (*)    Glucose, Bld 112 (*)    Calcium 8.3 (*)    Albumin 2.9 (*)    ALT 12 (*)    All other components within normal limits  CBC WITH DIFFERENTIAL/PLATELET - Abnormal; Notable for the following:    WBC 24.8 (*)    RBC 3.31 (*)    Hemoglobin 9.8 (*)    HCT 29.9 (*)    RDW 16.7 (*)    Platelets 678 (*)    Neutro Abs 22.6 (*)    Lymphs Abs 0.8 (*)    Monocytes Absolute 1.1 (*)    All other components within normal limits  URINALYSIS, COMPLETE (UACMP) WITH MICROSCOPIC - Abnormal; Notable for the following:    Color, Urine YELLOW (*)    APPearance CLEAR (*)     Ketones, ur 20 (*)    Leukocytes, UA TRACE (*)    Squamous Epithelial / LPF 0-5 (*)    All other components within normal limits  CULTURE, BLOOD (ROUTINE X 2)  CULTURE, BLOOD (ROUTINE X 2)  URINE CULTURE  LACTIC ACID, PLASMA  PROTIME-INR   ____________________________________________   EKG    ____________________________________________    RADIOLOGY  Ct Abdomen Pelvis W Contrast  Result Date: 01/21/2017 CLINICAL DATA:  Tachycardia and leukocytosis with history of intra-abdominal abscess after rupture of bowel. Positive for cancer although etiology is unknown. EXAM: CT ABDOMEN AND PELVIS WITH CONTRAST TECHNIQUE: Multidetector CT imaging of the abdomen and pelvis was performed using the standard protocol following bolus administration of intravenous contrast. CONTRAST:  149mL ISOVUE-300 IOPAMIDOL (ISOVUE-300) INJECTION 61% COMPARISON:  01/08/2017 FINDINGS: Lower chest: Interval increase in size and number of pulmonary nodular opacities within the visualized lung bases, example series 4, image 11 in the right middle lobe now measuring 11 mm versus 5 mm on prior exam. No effusion or pneumothorax. Normal visualized cardiac chamber size. No pericardial effusion. Hepatobiliary: Interval increase in size and number of peripherally enhancing hepatic lesions, new adjacent to the gallbladder fossa on series 2, image 30 measuring 10 mm and slightly larger dominant lesion in the left hepatic lobe estimated at 17 x 13 mm versus approximately 14 mm previously. Gallbladder is physiologically distended without calculi. Minimal intrahepatic ductal dilatation in the left hepatic lobe adjacent to the dominant presumed metastatic lesion. Adjacent to the right hepatic lobe along its caudal aspect is a 16 mm circumscribed hypodensity, smaller in appearance than on prior which had measured approximately 21 mm in diameter. Small abscess or necrotic metastasis might account for this appearance, series 2, image 40.  Pancreas: Unremarkable. No pancreatic ductal dilatation or surrounding inflammatory changes. Spleen: Normal in size without focal abnormality. Adrenals/Urinary Tract: Normal bilateral adrenal glands and right kidney. New hypodensity in the interpolar aspect of the left kidney measuring 14 x 12 mm. Differential possibilities may include a renal neoplasm, focus of infection, metastasis or possibly infarct, series 2, image 32. No nephrolithiasis nor obstructive uropathy. The urinary bladder is physiologically distended. Stomach/Bowel: Physiologic distention of the stomach. Normal small bowel rotation. Left lower  quadrant colostomy. Status post partial resection sigmoid. Vascular/Lymphatic: Small mesenteric nodules are noted in the mid pelvis measuring up to 7 mm short axis and may represent reactive or metastatic adenopathy. The aorta and iliac vessels are unremarkable. Reproductive: Heterogeneous enhancement of uterus consistent with fibroids. Reportly, both ovaries have been removed. Other: Inflammatory/phlegmonous change about the uterus at site of prior pelvic abscesses. These collections are more thick-walled in appearance and minimally larger on the right measuring 30 x 14 mm versus 30 x 10 mm previously. 11 x 5 mm left-sided thick-walled abscess versus 24 x 15 mm previously. Percutaneous pigtail drainage catheter from right lower quadrant approach is noted coiled in the right hemipelvis anteriorly as before. Musculoskeletal: No acute or significant osseous findings. IMPRESSION: 1. Interval increase in size and number of pulmonary and hepatic lesions consistent with worsening metastatic disease. 2. New ill-defined 14 x 12 mm interpolar hypodensity associated with the left kidney which may reflect a focus of infection, infarct, or neoplasm/metastasis. 3. Phlegmonous change in the pelvis adjacent to the uterus with slightly thicker walled presumed abscess collections minimally larger on the right and smaller on the  left. A pigtail percutaneous drainage catheter is seen just anterior to the larger right-sided collection. 4. Smaller circumscribed subhepatic 16 mm hypodensity versus 21 mm previously potentially representing a small abscess or necrotic metastasis. 5. Small subcentimeter mesenteric lymph nodes are noted as above described measuring up to 7 mm. Reactive versus metastatic. Electronically Signed   By: Ashley Royalty M.D.   On: 01/21/2017 15:37    ____________________________________________   PROCEDURES Procedures  ____________________________________________   INITIAL IMPRESSION / ASSESSMENT AND PLAN / ED COURSE  Pertinent labs & imaging results that were available during my care of the patient were reviewed by me and considered in my medical decision making (see chart for details).   Clinical Course as of Jan 21 1742  Tue Jan 21, 2017  1232 Pt p/w tachycardia, hr 120 at rest. Will d/w Pabon to expedite evaluation given surgery service's familiarity with patient, f/u labs.   [PS]  1610 Rec'd message relayed through OR circ. Nurse - Pabon suggests working up patient as a spontaneous walk-in and not because she was instructed to come by surgery clinic phone nurse, consult as needed.   [PS]  9604 Wbc 25,000 today. Will need to obtain repeat CT a/p.   [PS]    Clinical Course User Index [PS] Carrie Mew, MD    ----------------------------------------- 5:43 PM on 01/21/2017 -----------------------------------------  Discussed with surgery Dr. Dahlia Byes. Agrees patient will need to be admitted for IV antibiotics, possibly PICC line placement and prolonged antibiotics. He'll discussed with hospitalist who will arrange admission and coordination of care. Patient not febrile at any point in the ED, blood pressure stable, not septic.   ____________________________________________   FINAL CLINICAL IMPRESSION(S) / ED DIAGNOSES  Final diagnoses:  Phlegmonous peritonitis ([place])  Leukocytosis,  unspecified type      New Prescriptions   No medications on file     Portions of this note were generated with dragon dictation software. Dictation errors may occur despite best attempts at proofreading.    Carrie Mew, MD 01/21/17 [phone]

## 2017-01-21 NOTE — ED Notes (Signed)
Patient transported to CT 

## 2017-01-21 NOTE — ED Notes (Signed)
Resumed care from kate rn  Pt alert  Family with pt. Sinus tach on monitor.  2 iv's in place.

## 2017-01-21 NOTE — ED Triage Notes (Addendum)
Pt has had complicated surgical history over last month. Had ruptured bowel with 6in bowel removed.  Ended up with abscess in abdomen after surgery. Has wound vac to abdomen now.  Had high WBC that decreased to 11 but have now increased again to over 17 from labs a couple days ago.  Tachy in 130s today.  Also had pathology from surgery come back as cancer but unsure where origin is.

## 2017-01-21 NOTE — Progress Notes (Signed)
Pharmacy Antibiotic Note  Anna [name] is a 51 y.o. female admitted on 01/21/2017 with intra-abdominal infection.  Pharmacy has been consulted for cefepime and metronidazole dosing. Patient received cefepime 2 g IV and metronidazole 500 mg IV x 1 in ED  Plan: Begin cefepime 2 g IV q 8 hours  Begin metronidazole 500 mg IV q 8 hours  Height: 5\' 3"  (160 cm) Weight: 136 lb (61.7 kg) IBW/kg (Calculated) : 52.4  Temp (24hrs), Avg:98.8 F (37.1 C), Min:98.6 F (37 C), Max:99 F (37.2 C)   Recent Labs Lab 01/21/17 1213 01/21/17 1223  WBC  --  24.8*  CREATININE  --  0.46  LATICACIDVEN 1.3  --     Estimated Creatinine Clearance: 68 mL/min (by C-G formula based on SCr of 0.46 mg/dL).    Allergies  Allergen Reactions  . Penicillins Rash    As a child. Has patient had a PCN reaction causing immediate rash, facial/tongue/throat swelling, SOB or lightheadedness with hypotension: Unknown Has patient had a PCN reaction causing severe rash involving mucus membranes or skin necrosis: Unknown Has patient had a PCN reaction that required hospitalization: No Has patient had a PCN reaction occurring within the last 10 years: No If all of the above answers are "NO", then may proceed with Cephalosporin use.     Antimicrobials this admission: metrondiazole 5/29 >>  Cefepime 5/29 >>  Dose adjustments this admission:  Microbiology results: 5/29 BCx: sent 5/29 UCx:    Sputum:    MRSA PCR:   Thank you for allowing pharmacy to be a part of this patient's care.

## 2017-01-21 NOTE — ED Notes (Signed)
Dr Mariel Aloe in with pt and family   meds infusing  Pt drinking water and icechips now

## 2017-01-21 NOTE — Telephone Encounter (Signed)
I have called and spoken to Anna [name]. She will see Dr. Rogue Bussing 5/30 at 1400 and Dr. Theora Gianotti following at 1430. Directions to [place] given.

## 2017-01-21 NOTE — ED Triage Notes (Signed)
Pt sent here to see Dr Dahlia Byes, WBC is 17 on 5/25, wound vac in place.

## 2017-01-21 NOTE — ED Notes (Signed)
Pt transported to room 107 

## 2017-01-21 NOTE — Consult Note (Signed)
Patient ID: Anna [name], female   DOB: 03/12/1966, 51 y.o.   MRN: [id number]  HPI Anna [name] is a 51 y.o. female 51 year old female well known to our service and asked to being seen in consultation by Dr. Joni Fears. 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 He recently had a Hartman's procedure on May 8  for sigmoid perforation from cancer. At that time she had multiple abdominal abscesses and purulent peritonitis. She developed a pelvic abscess that needed percutaneous drainage by interventional radiology. She did well and went home and today went to her primary care physician appointment with her labs were obtained and shown an increase in the white count. Patient was also found to be tachycardic and the patient was sent to the emergency room for further workup. Of note the patient states that she is tolerating diet and she is not having any more abdominal pain and her drains is serous fluid. She can continues to care for her wound VAC that is changed Monday Wednesday and Friday. I denies any fevers or chills. No emesis. On her workup today and included an increased white count to 24,000 as well as a CT scan that I have personally reviewed showing no major changes in the small intra-abdominal collections but more importantly there is been an increase in size of both the pulmonary metastasis as well as the liver metastases and a new prostatic fossae on the left kidney.. She is being followed by Dr. Dionne Bucy  from oncology,. So far no definitive primary source of her metastatic tumor. Please also note that she has been tachycardic for more than a month  HPI  Past Medical History:  Diagnosis Date  . Cancer ([place])   . Cancer with unknown primary site [facility])   . Colon polyp   . Diverticulosis   . Fibroid uterus   . Heme positive stool 08/01/2014  . HH (hiatus hernia)   . Large bowel perforation  ([place]) 12/31/2016  . Metastatic adenocarcinoma ([place]) 12/20/2016  . Pelvic pain in female 01/13/2017  . Perforation of sigmoid colon [facility])     Past Surgical History:  Procedure Laterality Date  . COLONOSCOPY    . CYSTOSCOPY  12/20/2016   Procedure: CYSTOSCOPY;  Surgeon: Boykin Nearing, MD;  Location: [facility] ORS;  Service: Gynecology;;  . ESOPHAGOGASTRODUODENOSCOPY    . LAPAROSCOPIC BILATERAL SALPINGECTOMY Bilateral 12/20/2016   Procedure: LAPAROSCOPIC BILATERAL SALPINGOOPHERECTOMY,  Excision of anterior 5 cm calcified uterine fibroid;  Surgeon: Boykin Nearing, MD;  Location: [facility] ORS;  Service: Gynecology;  Laterality: Bilateral;  . LAPAROSCOPIC LYSIS OF ADHESIONS  12/20/2016   Procedure: LAPAROSCOPIC LYSIS OF ADHESIONS WITH DISSECTION OF LEFT TUBE AND OVARY.;  Surgeon: Olean Ree, MD;  Location: [facility] ORS;  Service: General;;  . LAPAROTOMY N/A 12/31/2016   Procedure: EXPLORATORY LAPAROTOMY, sigmoid colectomy, removal of omentum, colostomy;  Surgeon: Jules Husbands, MD;  Location: [facility] ORS;  Service: General;  Laterality: N/A;  . SIGMOIDOSCOPY  12/20/2016   Procedure: RIGID SIGMOIDOSCOPY;  Surgeon: Olean Ree, MD;  Location: [facility] ORS;  Service: General;;  . TONSILLECTOMY      Family History  Problem Relation Age of Onset  . Colon cancer Mother 74  . Diabetes Mother   . Asthma Father   . Breast cancer Neg Hx     Social History Social History  Substance Use Topics  . Smoking status: Never Smoker  .  Smokeless tobacco: Never Used  . Alcohol use No    Allergies  Allergen Reactions  . Penicillins Rash    As a child. Has patient had a PCN reaction causing immediate rash, facial/tongue/throat swelling, SOB or lightheadedness with hypotension: Unknown Has patient had a PCN reaction causing severe rash involving mucus membranes or skin necrosis: Unknown Has patient had a PCN reaction that required hospitalization: No Has patient had a PCN reaction occurring within the last 10  years: No If all of the above answers are "NO", then may proceed with Cephalosporin use.     Current Facility-Administered Medications  Medication Dose Route Frequency Provider Last Rate Last Dose  . ceFEPIme (MAXIPIME) 2 g in dextrose 5 % 50 mL IVPB  2 g Intravenous STAT Carrie Mew, MD      . metroNIDAZOLE (FLAGYL) IVPB 500 mg  500 mg Intravenous Once Carrie Mew, MD 100 mL/hr at 01/21/17 1744 500 mg at 01/21/17 1744   Current Outpatient Prescriptions  Medication Sig Dispense Refill  . acetaminophen (TYLENOL) 500 MG tablet Take 500 mg by mouth as needed for mild pain.    . ciprofloxacin (CIPRO) 500 MG tablet Take 1 tablet (500 mg total) by mouth 2 (two) times daily. 20 tablet 1  . metoprolol succinate (TOPROL-XL) 25 MG 24 hr tablet Take 25 mg by mouth daily.  0  . metroNIDAZOLE (FLAGYL) 500 MG tablet Take 1 tablet (500 mg total) by mouth 3 (three) times daily. 30 tablet 1  . oxyCODONE-acetaminophen (ROXICET) 5-325 MG tablet Take 1 tablet by mouth every 6 (six) hours as needed. 20 tablet 0  . estradiol (VIVELLE-DOT) 0.025 MG/24HR Place 1 patch onto the skin 2 (two) times a week.       Review of Systems Full ROS  was asked and was negative except for the information on the HPI  Physical Exam Blood pressure (!) 139/92, pulse (!) 117, temperature 98.6 F (37 C), temperature source Oral, resp. rate (!) 27, height 5\' 3"  (1.6 m), weight 61.7 kg (136 lb), last menstrual period 03/01/2015, SpO2 100 %. CONSTITUTIONAL: NAD EYES: Pupils are equal, round, and reactive to light, Sclera are non-icteric. EARS, NOSE, MOUTH AND THROAT: The oropharynx is clear. The oral mucosa is pink and moist. Hearing is intact to voice. LYMPH NODES:  Lymph nodes in the neck are normal. RESPIRATORY:  Lungs are clear. There is normal respiratory effort, with equal breath sounds bilaterally, and without pathologic use of accessory muscles. CARDIOVASCULAR: Heart is regular without murmurs, gallops, or  rubs. GI: The abdomen is  soft, nontender, and nondistended. There are no palpable masses.Wound vac in place , two JP drains w serous fluid/ no peritonitis GU: Rectal deferred.   MUSCULOSKELETAL: Normal muscle strength and tone. No cyanosis or edema.   SKIN: Turgor is good and there are no pathologic skin lesions or ulcers. NEUROLOGIC: Motor and sensation is grossly normal. Cranial nerves are grossly intact. PSYCH:  Oriented to person, place and time. Affect is normal.  Data Reviewed  I have personally reviewed the patient's imaging, laboratory findings and medical records.    Assessment/Plan 51 year old with worsening metastatic cancer an increase in pulmonary lesions in the liver lesion with a new lesion on the left kidney now with chronic intra-abdominal abscesses that are in need for continuation of IV antibiotics. Discussed with the ER and with Dr. Posey Pronto and I do recommend changing antibiotics to cefepime and Flagyl since she has been on Cipro before And she is allergic to penicillin. No  need for further surgical attention for any procedures at this time. We'll continue current drain care as well as wound VAC therapy. Discussed in detail with Dr. Posey Pronto who will and meet and be primary. Also recommend an infectious disease consultation since she is benign need long-term antibiotic therapy. We will continue to follow her daily Caroleen Hamman, MD FACS General Surgeon 01/21/2017, 6:01 PM

## 2017-01-21 NOTE — Telephone Encounter (Signed)
Orders are in

## 2017-01-21 NOTE — ED Notes (Signed)
Pt ambulatory to toilet with minimal assistance.

## 2017-01-21 NOTE — ED Notes (Signed)
Pt ambulatory to toilet by self. Almost finished with contrast.

## 2017-01-21 NOTE — ED Notes (Signed)
Pt up to bathroom with assistance.  Pt alert.  Iv in place.  Pt emptied colostomy bag.

## 2017-01-21 NOTE — Telephone Encounter (Signed)
Anna [name]:  Pt needs a lab appt for tomorrow too.

## 2017-01-21 NOTE — Telephone Encounter (Signed)
Per appt schedule pt has been added for labs

## 2017-01-21 NOTE — H&P (Signed)
[facility] NAME: Anna [name]    MR#:  [id number]  DATE OF BIRTH:  1965/12/31  DATE OF ADMISSION:  01/21/2017  PRIMARY CARE PHYSICIAN: McLean-Scocuzza, Nino Glow, MD   REQUESTING/REFERRING PHYSICIAN: Dr. Joni Fears  CHIEF COMPLAINT:  Elevated white count and increased heart rate noted at primary care physician's office  HISTORY OF PRESENT ILLNESS:  Anna [name]  is a 51 y.o. female with a known history of Recently diagnosed adenocarcinoma of unknown primary recently underwent Hartman's procedure on May 8 for sigmoid perforation from cancer. Patient went home and was on Cipro and Flagyl given her multiple pelvic abscesses which were drained during last admission. She has a wound VAC that is changed Monday Wednesday Friday by home health nurse. She went for her routine physical exam Department Physician who did blood work and was noted to have elevated white count of 24,000 and she was noted to be tachycardic in the 110s. No fever noted. Appetite is is decent.  Patient is being admitted with sepsis from pelvic abscess. It was also noted on recent CT scan that she has multiple metastases to the liver lobes and a lesion was noted in her kidneys as well She follows with Dr. Yevette Edwards at the cancer center.  She was seen by Dr pabon from surgery and from his standpoint pelvic abscesses are small stable do not require any drainage or she is not a candidate for any further surgical option given her recent major surgeries. Internal medicine was therefore consulted for admission regarding sepsis from pelvic abscess and advanced metastatic adenocarcinoma with unknown primary.  PAST MEDICAL HISTORY:   Past Medical History:  Diagnosis Date  . Cancer ([place])   . Cancer with unknown primary site [facility])   . Colon polyp   . Diverticulosis   . Fibroid uterus   . Heme positive stool 08/01/2014  . HH (hiatus hernia)   . Large bowel perforation ([place])  12/31/2016  . Metastatic adenocarcinoma ([place]) 12/20/2016  . Pelvic pain in female 01/13/2017  . Perforation of sigmoid colon ([place])     PAST SURGICAL HISTOIRY:   Past Surgical History:  Procedure Laterality Date  . COLONOSCOPY    . CYSTOSCOPY  12/20/2016   Procedure: CYSTOSCOPY;  Surgeon: Boykin Nearing, MD;  Location: [facility] ORS;  Service: Gynecology;;  . ESOPHAGOGASTRODUODENOSCOPY    . LAPAROSCOPIC BILATERAL SALPINGECTOMY Bilateral 12/20/2016   Procedure: LAPAROSCOPIC BILATERAL SALPINGOOPHERECTOMY,  Excision of anterior 5 cm calcified uterine fibroid;  Surgeon: Boykin Nearing, MD;  Location: [facility] ORS;  Service: Gynecology;  Laterality: Bilateral;  . LAPAROSCOPIC LYSIS OF ADHESIONS  12/20/2016   Procedure: LAPAROSCOPIC LYSIS OF ADHESIONS WITH DISSECTION OF LEFT TUBE AND OVARY.;  Surgeon: Olean Ree, MD;  Location: [facility] ORS;  Service: General;;  . LAPAROTOMY N/A 12/31/2016   Procedure: EXPLORATORY LAPAROTOMY, sigmoid colectomy, removal of omentum, colostomy;  Surgeon: Jules Husbands, MD;  Location: [facility] ORS;  Service: General;  Laterality: N/A;  . SIGMOIDOSCOPY  12/20/2016   Procedure: RIGID SIGMOIDOSCOPY;  Surgeon: Olean Ree, MD;  Location: [facility] ORS;  Service: General;;  . TONSILLECTOMY      SOCIAL HISTORY:   Social History  Substance Use Topics  . Smoking status: Never Smoker  . Smokeless tobacco: Never Used  . Alcohol use No    FAMILY HISTORY:   Family History  Problem Relation Age of Onset  . Colon cancer Mother 60  . Diabetes Mother   . Asthma Father   .  Breast cancer Neg Hx     DRUG ALLERGIES:   Allergies  Allergen Reactions  . Penicillins Rash    As a child. Has patient had a PCN reaction causing immediate rash, facial/tongue/throat swelling, SOB or lightheadedness with hypotension: Unknown Has patient had a PCN reaction causing severe rash involving mucus membranes or skin necrosis: Unknown Has patient had a PCN reaction that required hospitalization:  No Has patient had a PCN reaction occurring within the last 10 years: No If all of the above answers are "NO", then may proceed with Cephalosporin use.     REVIEW OF SYSTEMS:  Review of Systems  Constitutional: Negative for chills, fever and weight loss.  HENT: Negative for ear discharge, ear pain and nosebleeds.   Eyes: Negative for blurred vision, pain and discharge.  Respiratory: Negative for sputum production, shortness of breath, wheezing and stridor.   Cardiovascular: Negative for chest pain, palpitations, orthopnea and PND.  Gastrointestinal: Positive for nausea. Negative for abdominal pain, diarrhea and vomiting.  Genitourinary: Negative for frequency and urgency.  Musculoskeletal: Negative for back pain and joint pain.  Neurological: Positive for weakness. Negative for sensory change, speech change and focal weakness.  Psychiatric/Behavioral: Negative for depression and hallucinations. The patient is not nervous/anxious.      MEDICATIONS AT HOME:   Prior to Admission medications   Medication Sig Start Date End Date Taking? Authorizing Provider  acetaminophen (TYLENOL) 500 MG tablet Take 500 mg by mouth as needed for mild pain.   Yes [provider]  ciprofloxacin (CIPRO) 500 MG tablet Take 1 tablet (500 mg total) by mouth 2 (two) times daily. 01/10/17  Yes Florene Glen, MD  metoprolol succinate (TOPROL-XL) 25 MG 24 hr tablet Take 25 mg by mouth daily. 01/17/17  Yes [provider]  metroNIDAZOLE (FLAGYL) 500 MG tablet Take 1 tablet (500 mg total) by mouth 3 (three) times daily. 01/10/17  Yes Florene Glen, MD  oxyCODONE-acetaminophen (ROXICET) 5-325 MG tablet Take 1 tablet by mouth every 6 (six) hours as needed. 01/10/17 01/10/18 Yes Florene Glen, MD  estradiol (VIVELLE-DOT) 0.025 MG/24HR Place 1 patch onto the skin 2 (two) times a week.    [provider]      VITAL SIGNS:  Blood pressure 136/80, pulse (!) 122, temperature 98.6 F (37  C), temperature source Oral, resp. rate 19, height 5\' 3"  (1.6 m), weight 61.7 kg (136 lb), last menstrual period 03/01/2015, SpO2 100 %.  PHYSICAL EXAMINATION:  GENERAL:  51 y.o.-year-old patient lying in the bed with no acute distress.  EYES: Pupils equal, round, reactive to light and accommodation. No scleral icterus. Extraocular muscles intact.  HEENT: Head atraumatic, normocephalic. Oropharynx and nasopharynx clear.  NECK:  Supple, no jugular venous distention. No thyroid enlargement, no tenderness.  LUNGS: Normal breath sounds bilaterally, no wheezing, rales,rhonchi or crepitation. No use of accessory muscles of respiration.  CARDIOVASCULAR: S1, S2 normal. No murmurs, rubs, or gallops. Tachycardia  ABDOMEN: Soft, nontender, nondistended. Bowel sounds present. No organomegaly or mass. Midline wound VAC present over the surgical scar. Colostomy bag present.  EXTREMITIES: No pedal edema, cyanosis, or clubbing.  NEUROLOGIC: Cranial nerves II through XII are intact. Muscle strength 5/5 in all extremities. Sensation intact. Gait not checked.  PSYCHIATRIC: The patient is alert and oriented x 3.  SKIN: No obvious rash, lesion, or ulcer.   LABORATORY PANEL:   CBC  Recent Labs Lab 01/21/17 1223  WBC 24.8*  HGB 9.8*  HCT 29.9*  PLT 678*   ------------------------------------------------------------------------------------------------------------------  Chemistries   Recent Labs Lab 01/21/17 1223  NA 129*  K 2.6*  CL 87*  CO2 32  GLUCOSE 112*  BUN 14  CREATININE 0.46  CALCIUM 8.3*  AST 27  ALT 12*  ALKPHOS 56  BILITOT 0.6   ------------------------------------------------------------------------------------------------------------------  Cardiac Enzymes No results for input(s): TROPONINI in the last 168 hours. ------------------------------------------------------------------------------------------------------------------  RADIOLOGY:  Ct Abdomen Pelvis W  Contrast  Result Date: 01/21/2017 CLINICAL DATA:  Tachycardia and leukocytosis with history of intra-abdominal abscess after rupture of bowel. Positive for cancer although etiology is unknown. EXAM: CT ABDOMEN AND PELVIS WITH CONTRAST TECHNIQUE: Multidetector CT imaging of the abdomen and pelvis was performed using the standard protocol following bolus administration of intravenous contrast. CONTRAST:  139mL ISOVUE-300 IOPAMIDOL (ISOVUE-300) INJECTION 61% COMPARISON:  01/08/2017 FINDINGS: Lower chest: Interval increase in size and number of pulmonary nodular opacities within the visualized lung bases, example series 4, image 11 in the right middle lobe now measuring 11 mm versus 5 mm on prior exam. No effusion or pneumothorax. Normal visualized cardiac chamber size. No pericardial effusion. Hepatobiliary: Interval increase in size and number of peripherally enhancing hepatic lesions, new adjacent to the gallbladder fossa on series 2, image 30 measuring 10 mm and slightly larger dominant lesion in the left hepatic lobe estimated at 17 x 13 mm versus approximately 14 mm previously. Gallbladder is physiologically distended without calculi. Minimal intrahepatic ductal dilatation in the left hepatic lobe adjacent to the dominant presumed metastatic lesion. Adjacent to the right hepatic lobe along its caudal aspect is a 16 mm circumscribed hypodensity, smaller in appearance than on prior which had measured approximately 21 mm in diameter. Small abscess or necrotic metastasis might account for this appearance, series 2, image 40. Pancreas: Unremarkable. No pancreatic ductal dilatation or surrounding inflammatory changes. Spleen: Normal in size without focal abnormality. Adrenals/Urinary Tract: Normal bilateral adrenal glands and right kidney. New hypodensity in the interpolar aspect of the left kidney measuring 14 x 12 mm. Differential possibilities may include a renal neoplasm, focus of infection, metastasis or possibly  infarct, series 2, image 32. No nephrolithiasis nor obstructive uropathy. The urinary bladder is physiologically distended. Stomach/Bowel: Physiologic distention of the stomach. Normal small bowel rotation. Left lower quadrant colostomy. Status post partial resection sigmoid. Vascular/Lymphatic: Small mesenteric nodules are noted in the mid pelvis measuring up to 7 mm short axis and may represent reactive or metastatic adenopathy. The aorta and iliac vessels are unremarkable. Reproductive: Heterogeneous enhancement of uterus consistent with fibroids. Reportly, both ovaries have been removed. Other: Inflammatory/phlegmonous change about the uterus at site of prior pelvic abscesses. These collections are more thick-walled in appearance and minimally larger on the right measuring 30 x 14 mm versus 30 x 10 mm previously. 11 x 5 mm left-sided thick-walled abscess versus 24 x 15 mm previously. Percutaneous pigtail drainage catheter from right lower quadrant approach is noted coiled in the right hemipelvis anteriorly as before. Musculoskeletal: No acute or significant osseous findings. IMPRESSION: 1. Interval increase in size and number of pulmonary and hepatic lesions consistent with worsening metastatic disease. 2. New ill-defined 14 x 12 mm interpolar hypodensity associated with the left kidney which may reflect a focus of infection, infarct, or neoplasm/metastasis. 3. Phlegmonous change in the pelvis adjacent to the uterus with slightly thicker walled presumed abscess collections minimally larger on the right and smaller on the left. A pigtail percutaneous drainage catheter is seen just anterior to the larger right-sided collection. 4. Smaller circumscribed subhepatic 16 mm hypodensity versus 21  mm previously potentially representing a small abscess or necrotic metastasis. 5. Small subcentimeter mesenteric lymph nodes are noted as above described measuring up to 7 mm. Reactive versus metastatic. Electronically Signed    By: Ashley Royalty M.D.   On: 01/21/2017 15:37    EKG:    IMPRESSION AND PLAN:    Anna [name]  is a 51 y.o. female with a known history of Recently diagnosed adenocarcinoma of unknown primary recently underwent Hartman's procedure on May 8 for sigmoid perforation from cancer. Patient was noted to have elevated white count of 24,000 and she was noted to be tachycardic in the 110s when she visited her primary care physician's office.  1. Sepsis due to ongoing treatment for pelvic abscess. Patient did have IR drain abscess last time -CT scan of the abdomen was reviewed by Dr. Dahlia Byes from general surgery and he does not take any abscesses need to be drained or she is a surgical candidate for that -IV cefepime and Flagyl -ID consultation placed. Patient may need IV antibiotics at home due to PICC line await Dr. Blane Ohara assessment  2. Adenocarcinoma metastatic with unknown primary -Oncology consultation -It seems her cancer is quite aggressive new lesions have been found in the lungs and liver  3. Tachycardia due to #1 -Continue metoprolol  4. Leukocytosis due to 1. -Continue to monitor fever curve and counts  5. DVT prophylaxis subcutaneous Lovenox  6. Wound care consultation for wound VAC changing on Monday metastatic and Friday -This was patient schedule at home   All the records are reviewed and case discussed with ED provider. Management plans discussed with the patient, family and they are in agreement.  CODE STATUS: Full  TOTAL TIME TAKING CARE OF THIS PATIENT: *50* minutes.    Anna [name] M.D on 01/21/2017 at 7:58 PM  Between 7am to 6pm - Pager - [phone]  After 6pm go to [email or web address] - password [email or web address] [facility]  [facility]  Office  [phone]  CC: Primary care physician; McLean-Scocuzza, Nino Glow, MD

## 2017-01-21 NOTE — Telephone Encounter (Signed)
Dr. Terese Door (patient's primary doctor) called stating that the patient was there at her office at this time. However, the reason that she called [place] was to let us know that the patient's heart rate was between 130-140, temperature of 99.1 F and that her white count was 11.8. She also stated that the patient continued taking her antibiotics (CIPRO and Flagyl) due to an abscess that she had. However, she was thinking that the patient could probably need her antibiotics to be changed to Levaquin and Flagyl. She also suggested for the patient to have an image done before she was seen tomorrow by Dr. Adonis Huguenin. I then told Dr. Terese Door to please tell the patient to go to the emergency room to be evaluated and that I would call the patient's surgeon Dr. Dahlia Byes since he is at the hospital at this time.  Dr. Mclean-Scocuzza agreed and stated that she would let the patient know to go to the emergency room.

## 2017-01-22 ENCOUNTER — Inpatient Hospital Stay: Payer: Managed Care, Other (non HMO)

## 2017-01-22 ENCOUNTER — Inpatient Hospital Stay: Payer: Managed Care, Other (non HMO) | Admitting: Internal Medicine

## 2017-01-22 ENCOUNTER — Other Ambulatory Visit: Payer: Self-pay | Admitting: Internal Medicine

## 2017-01-22 ENCOUNTER — Inpatient Hospital Stay: Payer: Managed Care, Other (non HMO) | Attending: Obstetrics and Gynecology | Admitting: Obstetrics and Gynecology

## 2017-01-22 ENCOUNTER — Encounter: Payer: Self-pay | Admitting: Internal Medicine

## 2017-01-22 ENCOUNTER — Encounter: Payer: Self-pay | Admitting: Obstetrics and Gynecology

## 2017-01-22 ENCOUNTER — Other Ambulatory Visit: Payer: Self-pay

## 2017-01-22 ENCOUNTER — Encounter: Payer: Managed Care, Other (non HMO) | Admitting: General Surgery

## 2017-01-22 DIAGNOSIS — K449 Diaphragmatic hernia without obstruction or gangrene: Secondary | ICD-10-CM | POA: Diagnosis not present

## 2017-01-22 DIAGNOSIS — N739 Female pelvic inflammatory disease, unspecified: Secondary | ICD-10-CM

## 2017-01-22 DIAGNOSIS — C799 Secondary malignant neoplasm of unspecified site: Secondary | ICD-10-CM

## 2017-01-22 DIAGNOSIS — Z79899 Other long term (current) drug therapy: Secondary | ICD-10-CM

## 2017-01-22 DIAGNOSIS — C801 Malignant (primary) neoplasm, unspecified: Secondary | ICD-10-CM | POA: Diagnosis not present

## 2017-01-22 DIAGNOSIS — C787 Secondary malignant neoplasm of liver and intrahepatic bile duct: Secondary | ICD-10-CM

## 2017-01-22 DIAGNOSIS — Z880 Allergy status to penicillin: Secondary | ICD-10-CM | POA: Insufficient documentation

## 2017-01-22 DIAGNOSIS — Z90722 Acquired absence of ovaries, bilateral: Secondary | ICD-10-CM | POA: Diagnosis not present

## 2017-01-22 DIAGNOSIS — C786 Secondary malignant neoplasm of retroperitoneum and peritoneum: Secondary | ICD-10-CM

## 2017-01-22 DIAGNOSIS — A419 Sepsis, unspecified organism: Principal | ICD-10-CM

## 2017-01-22 DIAGNOSIS — K5790 Diverticulosis of intestine, part unspecified, without perforation or abscess without bleeding: Secondary | ICD-10-CM | POA: Insufficient documentation

## 2017-01-22 DIAGNOSIS — Z7901 Long term (current) use of anticoagulants: Secondary | ICD-10-CM

## 2017-01-22 DIAGNOSIS — Z8719 Personal history of other diseases of the digestive system: Secondary | ICD-10-CM

## 2017-01-22 DIAGNOSIS — Z933 Colostomy status: Secondary | ICD-10-CM

## 2017-01-22 DIAGNOSIS — Z86010 Personal history of colonic polyps: Secondary | ICD-10-CM

## 2017-01-22 DIAGNOSIS — Z800 Family history of malignant neoplasm of digestive organs: Secondary | ICD-10-CM

## 2017-01-22 LAB — BASIC METABOLIC PANEL
ANION GAP: 7 (ref 5–15)
BUN: 8 mg/dL (ref 6–20)
CHLORIDE: 94 mmol/L — AB (ref 101–111)
CO2: 29 mmol/L (ref 22–32)
Calcium: 8.1 mg/dL — ABNORMAL LOW (ref 8.9–10.3)
Creatinine, Ser: 0.58 mg/dL (ref 0.44–1.00)
GFR calc Af Amer: >60 mL/min (ref >60)
GLUCOSE: 244 mg/dL — AB (ref 65–99)
Potassium: 4 mmol/L (ref 3.5–5.1)
Sodium: 130 mmol/L — ABNORMAL LOW (ref 135–145)

## 2017-01-22 LAB — CBC
HEMATOCRIT: 30.8 % — AB (ref 35.0–47.0)
HEMOGLOBIN: 10 g/dL — AB (ref 12.0–16.0)
MCH: 29.2 pg (ref 26.0–34.0)
MCHC: 32.5 g/dL (ref 32.0–36.0)
MCV: 90 fL (ref 80.0–100.0)
Platelets: 746 10*3/uL — ABNORMAL HIGH (ref 150–440)
RBC: 3.42 MIL/uL — AB (ref 3.80–5.20)
RDW: 16.9 % — ABNORMAL HIGH (ref 11.5–14.5)
WBC: 20.6 10*3/uL — ABNORMAL HIGH (ref 3.6–11.0)

## 2017-01-22 LAB — URINE CULTURE: CULTURE: NO GROWTH

## 2017-01-22 LAB — LACTATE DEHYDROGENASE: LDH: 173 U/L (ref 98–192)

## 2017-01-22 MED ORDER — CIPROFLOXACIN IN D5W 400 MG/200ML IV SOLN
400.0000 mg | Freq: Two times a day (BID) | INTRAVENOUS | Status: DC
  Administered 2017-01-22: 10:00:00 400 mg via INTRAVENOUS
  Filled 2017-01-22 (×3): qty 200

## 2017-01-22 MED ORDER — MEROPENEM 1 G IV SOLR
1.0000 g | Freq: Three times a day (TID) | INTRAVENOUS | Status: DC
  Administered 2017-01-22 – 2017-01-24 (×6): 1 g via INTRAVENOUS
  Filled 2017-01-22 (×8): qty 1

## 2017-01-22 MED ORDER — LACTATED RINGERS IV SOLN: INTRAVENOUS | Status: DC

## 2017-01-22 MED ORDER — METHYLPREDNISOLONE SODIUM SUCC 125 MG IJ SOLR
60.0000 mg | INTRAMUSCULAR | Status: DC
Start: 2017-01-22 — End: 2017-01-22
  Administered 2017-01-22: 10:00:00 60 mg via INTRAVENOUS
  Filled 2017-01-22: qty 2

## 2017-01-22 MED ORDER — POTASSIUM CHLORIDE IN NACL 20-0.9 MEQ/L-% IV SOLN
INTRAVENOUS | Status: DC
Start: 2017-01-22 — End: 2017-01-23
  Administered 2017-01-22 – 2017-01-23 (×3): via INTRAVENOUS
  Filled 2017-01-22 (×7): qty 1000

## 2017-01-22 MED ORDER — METOPROLOL TARTRATE 5 MG/5ML IV SOLN
5.0000 mg | Freq: Once | INTRAVENOUS | Status: AC
  Administered 2017-01-22: 5 mg via INTRAVENOUS
  Filled 2017-01-22: qty 5

## 2017-01-22 MED ORDER — MAGNESIUM SULFATE 4 GM/100ML IV SOLN
4.0000 g | Freq: Two times a day (BID) | INTRAVENOUS | Status: AC
  Administered 2017-01-22 (×2): 4 g via INTRAVENOUS
  Filled 2017-01-22 (×2): qty 100

## 2017-01-22 MED ORDER — PREDNISONE 50 MG PO TABS
50.0000 mg | ORAL_TABLET | Freq: Every day | ORAL | Status: DC
  Administered 2017-01-22: 50 mg via ORAL
  Filled 2017-01-22: qty 1

## 2017-01-22 NOTE — Care Management (Signed)
Admitted to this facility with the diagnosis of sepsis. Discharged from this facility 01/10/17. Perforated Sigmoid Colon surgery 01/10/17.  Discharged with colostomy, wound vac, and JP drain. Followed by [facility] for services in the home. Lives with husband, [name] [phone]. Seen Dr. Terese Door 01/17/17.  Shelbie Ammons RN MSN CCM Care Management [phone]

## 2017-01-22 NOTE — Progress Notes (Signed)
Gynecologic Oncology Consult Visit   Referring Provider: Charlaine Dalton, MD  Chief Concern: gynecologic exam: assess for gynecologic cancer primary   Subjective:  Anna [name] is a 51 y.o. G0P0 female who is seen in consultation from Anna [name] for possible gynecologic primary.  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 She was discharged on Cipro and Flagyl given her multiple pelvic abscesses which were drained during last admission. She was readmitted on 01/21/2017 for sepsis from pelvic abscess and is currently hospilizated. She is being managed with drain placement and antibiotics. She is considered to not be a surgical candidate.   Her cancer history is as follows:  She initially presented with bloating and pelvic pain. She was diagnosed with complex mainly solid left adnexal mass by imaging on 11/21/2016: 4.8 x 4.4 cm complex left adnexal mass with vascular and solid components. The EMS was 3.0 mm and two fibroids were noted (43.7 mm and 23.7 mm) Her pre-op CA 125-Normal. Of note she has a history of diverticulosis.   On 12/21/2016 she underwent laparoscopic, extensive pelvic abdominal adhesiolysis incorporating greater than 75% of the operating case, Bilateral salpingo-oophorectomy, peritoneal biopsy, cystoscopy and sigmoidoscopy. Findings included: "left adnexa revealed a dilated adnexal mass that was firm and densely adherent to the descending colon deep into the pelvis.  There was a 5 cm calcified pedunculated fibroid to the anterior uterus and the right fallopian tube and ovary appeared normal". Given the dense adherence of the descending pelvic colon to the left adnexal mass. General Surgery was consulted and Anna [name] assisted with the procedure. There was no mention of persistent disease at the termination of the  procedure.  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    A limited panel of immunohistochemical stains was performed and the  carcinoma demonstrates the following pattern of immunoreactivity:  CK7: Positive  CK20: Negative  GATA-3: Positive  ER: Negative  P53: Negative  PAX8: Negative  OC125: Positive  CDX-2: Negative  WT-1: Negative  This pattern of immunoreactivity does not support a primary ovarian  carcinoma and metastatic carcinoma is favored. Possible sites of origin  include pancreas and breast.   Cytology was also positive for adenocarcinoma.   The specimen was sent to [facility] and was reported to be consistent with squamous cell carcinoma with 90% probability and most likely either cervix (69%) or head/neck (21%).    Postoperatively she presented with generalized weakness, lethargy, SOB, generalized abdominal pain, diarrhea, and vomiting. Evaluation included CT scan.  12/30/2016 CT C/A/P Impression 1. Perforation at the mid sigmoid colon, with a large contiguous collection of free fluid and air tracking across the right side of the abdomen and left hemipelvis, measuring 20-30 cm in size. 2. Diffuse wall thickening along the ascending and proximal sigmoid colon, likely reflecting underlying infection. 3. Diffuse distention of small-bowel loops likely reflects underlying ileus. 4. No evidence of pulmonary embolus. 5. Scattered small nodules of varying size throughout the lungs, measuring up to 1.1 cm in size. These are suspicious for metastatic disease, given the patient's known metastatic adenocarcinoma. 6. Vague 1.4 cm hypodensity at the left hepatic lobe raises suspicion for metastatic disease. 7. Retroperitoneal nodes measure up to 1.2 cm in short axis. This is nonspecific, though metastatic disease cannot be excluded. Underlying retroperitoneal edema noted.  8. Irregular  enhancing nodules arising at the uterus. These may reflect uterine fibroids, though metastatic disease cannot be excluded. 9. No definite breast or pancreatic mass is seen. Given recent surgical pathology results, the primary source of the patient's adenocarcinoma is unclear.  On 12/31/2016 she underwent XL, lysis of adhesions, Hartmann's procedure, omentectomy, drainage of multiple interloop abscesses, placement blake drains x 2. Findings included 'perforated mid sigmoid colon with purulent peritonitis and obvious contamination of the abdominal cavity; multiple interloop small bowel abscesses and pelvic abscess; necrotic omentum; significant inflammatory response within the small bowel secondary to perforation, and "very hostile abdomen" due to significant inflammatory response.'. 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   She subsequently presented with increasing leukocytosis.  Image guided drainage of intra-abdominal abscess was performed.   01/05/2017 CT A/P IMPRESSION: 1. There is an organizing collection of fluid measuring 10 x 6 x 5 cm in the pelvis. There is peripheral enhancement around this collection as well some air in the collection. Given history, the findings are suspicious for developing abscess. A smaller collection in the bowel mesentary is less specific and could simply be postoperative. 2. Pulmonary nodule suspicious for metastatic disease. 3. Low-attenuation lesion in the left hepatic lobe is nonspecific. However, given history, a metastatic lesion could have this appearance. 4. Probable mild ileus.  No definite obstruction. 5. Shotty nodes in the retroperitoneum could be reactive. However, given history, it would be difficult to completely exclude metastatic disease.  01/08/2017 C IMPRESSION:Bibasilar pulmonary nodules are again noted concerning for metastatic disease. Stable 1.4 cm low density noted in left hepatic  lobe concerning for possible metastatic disease.  Tumor markers CA15-3, CA19-9, CA 27.29; CEA, AFP, LDH, and CA125 all negative. PD-L1 + in 50% of the tumor.    She presents for pelvic exam. Of note her last PAP was 06/19/2015 and was NILM with negative HRHPV testing.    Problem List: Patient Active Problem List   Diagnosis Date Noted  . Sepsis ([place]) 01/21/2017  . Phlegmonous peritonitis ([place])   . Pelvic pain in female 01/13/2017  . Large bowel perforation ([place]) 12/31/2016  . Perforation of sigmoid colon ([place])   . Abscess of abdominal cavity ([place])   . Dehydration, moderate 12/30/2016  . Metastatic adenocarcinoma ([place]) 12/20/2016  . Heme positive stool 08/01/2014    Past Medical History: Past Medical History:  Diagnosis Date  . Cancer ([place])   . Cancer with unknown primary site [facility])   . Colon polyp   . Diverticulosis   . Fibroid uterus   . Heme positive stool 08/01/2014  . HH (hiatus hernia)   . Large bowel perforation ([place]) 12/31/2016  . Metastatic adenocarcinoma ([place]) 12/20/2016  . Pelvic pain in female 01/13/2017  . Perforation of sigmoid colon [facility])     Past Surgical History: Past Surgical History:  Procedure Laterality Date  . COLONOSCOPY    . CYSTOSCOPY  12/20/2016   Procedure: CYSTOSCOPY;  Surgeon: Boykin Nearing, MD;  Location: [facility] ORS;  Service: Gynecology;;  . ESOPHAGOGASTRODUODENOSCOPY    . LAPAROSCOPIC BILATERAL SALPINGECTOMY Bilateral 12/20/2016   Procedure: LAPAROSCOPIC BILATERAL SALPINGOOPHERECTOMY,  Excision of anterior 5 cm calcified uterine fibroid;  Surgeon: Boykin Nearing, MD;  Location: [facility] ORS;  Service: Gynecology;  Laterality: Bilateral;  . LAPAROSCOPIC LYSIS OF ADHESIONS  12/20/2016   Procedure: LAPAROSCOPIC LYSIS OF ADHESIONS WITH DISSECTION OF LEFT TUBE AND OVARY.;  Surgeon: Olean Ree, MD;  Location: [facility] ORS;  Service: General;;  . LAPAROTOMY N/A  12/31/2016   Procedure: EXPLORATORY LAPAROTOMY, sigmoid colectomy, removal of  omentum, colostomy;  Surgeon: Jules Husbands, MD;  Location: [facility] ORS;  Service: General;  Laterality: N/A;  . SIGMOIDOSCOPY  12/20/2016   Procedure: RIGID SIGMOIDOSCOPY;  Surgeon: Olean Ree, MD;  Location: [facility] ORS;  Service: General;;  . TONSILLECTOMY      Past Gynecologic History: Pap as noted. See HPI    OB History:  OB History  Gravida Para Term Preterm AB Living  0 0 0 0 0 0  SAB TAB Ectopic Multiple Live Births  0 0 0 0 0        Family History: Family History  Problem Relation Age of Onset  . Colon cancer Mother 49  . Diabetes Mother   . Asthma Father   . Breast cancer Neg Hx     Social History: Social History   Social History  . Marital status: Married    Spouse name: N/A  . Number of children: N/A  . Years of education: N/A   Occupational History  . Not on file.   Social History Main Topics  . Smoking status: Never Smoker  . Smokeless tobacco: Never Used  . Alcohol use No  . Drug use: No  . Sexual activity: Not on file   Other Topics Concern  . Not on file   Social History Narrative  . No narrative on file    Allergies: Allergies  Allergen Reactions  . Cefepime Rash  . Penicillins Rash    As a child. Has patient had a PCN reaction causing immediate rash, facial/tongue/throat swelling, SOB or lightheadedness with hypotension: Unknown Has patient had a PCN reaction causing severe rash involving mucus membranes or skin necrosis: Unknown Has patient had a PCN reaction that required hospitalization: No Has patient had a PCN reaction occurring within the last 10 years: No If all of the above answers are "NO", then may proceed with Cephalosporin use.     Current Medications: No current facility-administered medications for this visit.    No current outpatient prescriptions on file.   Facility-Administered Medications Ordered in Other Visits  Medication Dose Route Frequency Provider Last Rate Last Dose  . 0.9 % NaCl with KCl 20 mEq/ L   infusion   Intravenous Continuous Theodoro Grist, MD 125 mL/hr at 01/22/17 1029    . acetaminophen (TYLENOL) tablet 650 mg  650 mg Oral Q6H PRN Fritzi Mandes, MD       Or  . acetaminophen (TYLENOL) suppository 650 mg  650 mg Rectal Q6H PRN Fritzi Mandes, MD      . ciprofloxacin (CIPRO) IVPB 400 mg  400 mg Intravenous Q12H Theodoro Grist, MD   Stopped at 01/22/17 1121  . diphenhydrAMINE (BENADRYL) capsule 25 mg  25 mg Oral Q6H PRN Hugelmeyer, Alexis, DO   25 mg at 01/21/17 2211  . enoxaparin (LOVENOX) injection 40 mg  40 mg Subcutaneous Q24H Fritzi Mandes, MD   40 mg at 01/21/17 2211  . methylPREDNISolone sodium succinate (SOLU-MEDROL) 125 mg/2 mL injection 60 mg  60 mg Intravenous Q24H Theodoro Grist, MD   60 mg at 01/22/17 0959  . metoprolol succinate (TOPROL-XL) 24 hr tablet 25 mg  25 mg Oral Daily Fritzi Mandes, MD   25 mg at 01/22/17 0955  . metroNIDAZOLE (FLAGYL) IVPB 500 mg  500 mg Intravenous Q8H Fritzi Mandes, MD   Stopped at 01/22/17 1121  . oxyCODONE-acetaminophen (PERCOCET/ROXICET) 5-325 MG per tablet 1 tablet  1 tablet Oral Q6H PRN Fritzi Mandes,  MD   1 tablet at 01/22/17 0717  . predniSONE (DELTASONE) tablet 50 mg  50 mg Oral Q breakfast Theodoro Grist, MD   50 mg at 01/22/17 0959  . traMADol (ULTRAM) tablet 50 mg  50 mg Oral Q6H PRN Fritzi Mandes, MD        Review of Systems General: improved since admission Skin: rash after antibiotic Gastrointestinal: negative for, nausea, vomiting, pain. Ostomy bag and drains in place.  Ob/Gyn: negative for, irregular bleeding, pain   Objective:  Physical Examination:  Temp:  [98.6 F (37 C)-99.9 F (37.7 C)] 98.9 F (37.2 C) (05/30 0414) Pulse Rate:  [107-133] 119 (05/30 0414) Resp:  [19-32] 20 (05/30 0414) BP: (103-146)/(55-101) 103/56 (05/30 0414) SpO2:  [96 %-100 %] 98 % (05/30 0414) Weight:  [55.3 kg (121 lb 14.4 oz)-61.7 kg (136 lb)] 55.3 kg (121 lb 14.4 oz) (05/30 0304)   ECOG Performance Status: 3 - Symptomatic, >50% confined to  bed  General appearance: alert, cooperative, appears stated age and no distress HEENT:PERRLA, extra ocular movement intact and sclera clear, anicteric Lymph node survey: non-palpable, inguinal Abdomen: soft, non-tender, without masses or organomegaly, no hernias and well healed incision. No ascites. Ostomy bag and drains in place.  Extremities: extremities normal, atraumatic, no cyanosis or edema Neurological exam reveals alert, oriented, normal speech  Pelvic: exam chaperoned by nurse;  Vulva: normal appearing vulva with no masses, tenderness or lesions; Vagina: normal vagina; Adnexa: surgically absent. Uterus: not enlarged or tender, but firm to palpation, 2-3 cm nodule located on the left uterosacral; Cervix: no lesions, nulliparous and not enlarged but very firm to palpation.; Rectal: confirmatory, she has a large mass (4-5 cm) on the cul-de-sac with impingement on the rectum.     Radiologic Imaging: Reviewed on site today    Assessment:  Vaidehi Braddy is a 51 y.o. 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 Discrepant pathology is confusing.     Medical co-morbidities complicating care: history of sepsis, several abdominal/pelvic adhesive disease, and prior abdominal surgery.  Plan:   Problem List Items Addressed This Visit      Other   Metastatic adenocarcinoma ([place])    Other Visit Diagnoses    Adenocarcinoma of unknown primary ([place])    -  Primary      I suspect she may have a poorly differentiated adenocarcinoma of Mullerian etiology. Determining the site of origin will not alter chemotherapy regimens or treatment as these cancer may all be treated with platin/taxane based regimens. I do recommend [facility] review of pathology. The discrepant "adenocarcinoma vs squamous carcinoma" is confusing. Pathology review may assist in determine histologic subtype. While the [facility] is not consistent with an ovarian  etiology, these findings could be seen with endometrial primary. Her normal endometrial stripe argues against an endometrial cancer. With a normal Pap and negative HRHPV, cervical cancer is less likely.  However, there are few cervical cancers that are not HPV positive.   At this point she is not a candidate for chemotherapy as she has an active infection. I would not be enthusiastic about bevacizumab in the setting of bowel perforation.   Moreover, I do not think she will be a candidate for future surgery based on the findings reported by Dr. Dahlia Byes.   The patient's diagnosis, an outline of the further diagnostic and laboratory studies which will be required, the recommendation, and alternatives were discussed.  All questions were answered to the patient's satisfaction.  Gillis Ends, MD    CC:  Anna Dalton, MD

## 2017-01-22 NOTE — Consult Note (Signed)
[facility] Infectious Disease     Reason for Consult: Intra-abdominal infection   Referring Physician: Seth Bake Date of Admission:  01/21/2017   Active Problems:   Sepsis ([place])   HPI: Anna [name] is a 51 y.o. female with recent dx of metastatic adenocarcinoma of unknown primary , s.p colonic perforation and Hartmanns pouch procedure May 8 complicated by intra-abdominal abscess. May 8 cx grew E coli, Eikenella, viridans strep.  May 13 had placement of drain with findings of 40 ML fluid and debris - cx negative.  Dced on cipro and flagyl but had worsening wbc up to 24 K so readmitted. Started on cefepime but developed a rash. CT done showed worsening metastatic disease, phlegmon in pelvis with presummed abscess collections with pigtail cath in place.  She currently reports feeling relatively well with no fevers and only mild abd pain. No nv. Rash resolving.  Past Medical History:  Diagnosis Date  . Cancer ([place])   . Cancer with unknown primary site [facility])   . Colon polyp   . Diverticulosis   . Fibroid uterus   . Heme positive stool 08/01/2014  . HH (hiatus hernia)   . Large bowel perforation ([place]) 12/31/2016  . Metastatic adenocarcinoma ([place]) 12/20/2016  . Pelvic pain in female 01/13/2017  . Perforation of sigmoid colon [facility])    Past Surgical History:  Procedure Laterality Date  . COLONOSCOPY    . CYSTOSCOPY  12/20/2016   Procedure: CYSTOSCOPY;  Surgeon: Boykin Nearing, MD;  Location: [facility] ORS;  Service: Gynecology;;  . ESOPHAGOGASTRODUODENOSCOPY    . LAPAROSCOPIC BILATERAL SALPINGECTOMY Bilateral 12/20/2016   Procedure: LAPAROSCOPIC BILATERAL SALPINGOOPHERECTOMY,  Excision of anterior 5 cm calcified uterine fibroid;  Surgeon: Boykin Nearing, MD;  Location: [facility] ORS;  Service: Gynecology;  Laterality: Bilateral;  . LAPAROSCOPIC LYSIS OF ADHESIONS  12/20/2016   Procedure: LAPAROSCOPIC LYSIS OF ADHESIONS WITH DISSECTION OF LEFT TUBE AND OVARY.;  Surgeon: Olean Ree, MD;  Location: [facility] ORS;  Service: General;;  . LAPAROTOMY N/A 12/31/2016   Procedure: EXPLORATORY LAPAROTOMY, sigmoid colectomy, removal of omentum, colostomy;  Surgeon: Jules Husbands, MD;  Location: [facility] ORS;  Service: General;  Laterality: N/A;  . SIGMOIDOSCOPY  12/20/2016   Procedure: RIGID SIGMOIDOSCOPY;  Surgeon: Olean Ree, MD;  Location: [facility] ORS;  Service: General;;  . TONSILLECTOMY     Social History  Substance Use Topics  . Smoking status: Never Smoker  . Smokeless tobacco: Never Used  . Alcohol use No   Family History  Problem Relation Age of Onset  . Colon cancer Mother 9  . Diabetes Mother   . Asthma Father   . Breast cancer Neg Hx     Allergies:  Allergies  Allergen Reactions  . Cefepime Rash  . Penicillins Rash    As a child. Has patient had a PCN reaction causing immediate rash, facial/tongue/throat swelling, SOB or lightheadedness with hypotension: Unknown Has patient had a PCN reaction causing severe rash involving mucus membranes or skin necrosis: Unknown Has patient had a PCN reaction that required hospitalization: No Has patient had a PCN reaction occurring within the last 10 years: No If all of the above answers are "NO", then may proceed with Cephalosporin use.     Current antibiotics: Antibiotics Given (last 72 hours)    Date/Time Action Medication Dose Rate   01/21/17 1744 New Bag/Given   metroNIDAZOLE (FLAGYL) IVPB 500 mg 500 mg 100 mL/hr   01/21/17 1833 New Bag/Given   ceFEPIme (MAXIPIME) 2 g  in dextrose 5 % 50 mL IVPB 2 g 100 mL/hr   01/22/17 0142 New Bag/Given   metroNIDAZOLE (FLAGYL) IVPB 500 mg 500 mg 100 mL/hr   01/22/17 1001 New Bag/Given   metroNIDAZOLE (FLAGYL) IVPB 500 mg 500 mg 100 mL/hr   01/22/17 1001 New Bag/Given   ciprofloxacin (CIPRO) IVPB 400 mg 400 mg 200 mL/hr      MEDICATIONS: . enoxaparin (LOVENOX) injection  40 mg Subcutaneous Q24H  . metoprolol succinate  25 mg Oral Daily  . predniSONE  50 mg Oral Q  breakfast    Review of Systems - 11 systems reviewed and negative per HPI   OBJECTIVE: Temp:  [98.6 F (37 C)-99.9 F (37.7 C)] 98.6 F (37 C) (05/30 1426) Pulse Rate:  [96-128] 120 (05/30 1426) Resp:  [18-32] 18 (05/30 1426) BP: (103-146)/(52-98) 108/66 (05/30 1426) SpO2:  [96 %-100 %] 98 % (05/30 1426) Weight:  [55.3 kg (121 lb 14.4 oz)] 55.3 kg (121 lb 14.4 oz) (05/30 0304) Physical Exam  Constitutional:  oriented to person, place, and time. appears well-developed and well-nourished. No distress.  HENT: [place]/AT, PERRLA, no scleral icterus Mouth/Throat: Oropharynx is clear and moist. No oropharyngeal exudate.  Cardiovascular: Normal rate, regular rhythm and normal heart sounds.  Pulmonary/Chest: Effort normal and breath sounds normal. No respiratory distress.  has no wheezes.  Neck = supple, no nuchal rigidity Abdominal: Soft. Wound vac over ant abd incision site. 2 JP drains in RLQ and pelvic region with thin serous drainage Lymphadenopathy: no cervical adenopathy. No axillary adenopathy Neurological: alert and oriented to person, place, and time.  Skin: Skin is warm and dry. No rash noted. No erythema.  Psychiatric: a normal mood and affect.  behavior is normal.    LABS: Results for orders placed or performed during the hospital encounter of 01/21/17 (from the past 48 hour(s))  Lactic acid, plasma     Status: None   Collection Time: 01/21/17 12:13 PM  Result Value Ref Range   Lactic Acid, Venous 1.3 0.5 - 1.9 mmol/L  Urine culture     Status: None   Collection Time: 01/21/17 12:13 PM  Result Value Ref Range   Specimen Description URINE, RANDOM    Special Requests NONE    Culture      NO GROWTH Performed at [facility], [address] [place], [place] [address]    Report Status 01/22/2017 FINAL   Comprehensive metabolic panel     Status: Abnormal   Collection Time: 01/21/17 12:23 PM  Result Value Ref Range   Sodium 129 (L) 135 - 145 mmol/L   Potassium 2.6  (LL) 3.5 - 5.1 mmol/L    Comment: CRITICAL RESULT CALLED TO, READ BACK BY AND VERIFIED WITH KATE BUMGARNER AT 1346 01/21/17 DAS    Chloride 87 (L) 101 - 111 mmol/L   CO2 32 22 - 32 mmol/L   Glucose, Bld 112 (H) 65 - 99 mg/dL   BUN 14 6 - 20 mg/dL   Creatinine, Ser 0.46 0.44 - 1.00 mg/dL   Calcium 8.3 (L) 8.9 - 10.3 mg/dL   Total Protein 6.6 6.5 - 8.1 g/dL   Albumin 2.9 (L) 3.5 - 5.0 g/dL   AST 27 15 - 41 U/L   ALT 12 (L) 14 - 54 U/L   Alkaline Phosphatase 56 38 - 126 U/L   Total Bilirubin 0.6 0.3 - 1.2 mg/dL   GFR calc non Af Amer >60 >60 mL/min   GFR calc Af Amer >60 >60  mL/min    Comment: (NOTE) The eGFR has been calculated using the CKD EPI equation. This calculation has not been validated in all clinical situations. eGFR's persistently <60 mL/min signify possible Chronic Kidney Disease.    Anion gap 10 5 - 15  CBC with Differential     Status: Abnormal   Collection Time: 01/21/17 12:23 PM  Result Value Ref Range   WBC 24.8 (H) 3.6 - 11.0 K/uL   RBC 3.31 (L) 3.80 - 5.20 MIL/uL   Hemoglobin 9.8 (L) 12.0 - 16.0 g/dL   HCT 29.9 (L) 35.0 - 47.0 %   MCV 90.4 80.0 - 100.0 fL   MCH 29.6 26.0 - 34.0 pg   MCHC 32.7 32.0 - 36.0 g/dL   RDW 16.7 (H) 11.5 - 14.5 %   Platelets 678 (H) 150 - 440 K/uL    Comment: COUNT MAY BE INACCURATE DUE TO FIBRIN CLUMPS.   Neutrophils Relative % 91 %   Neutro Abs 22.6 (H) 1.4 - 6.5 K/uL   Lymphocytes Relative 3 %   Lymphs Abs 0.8 (L) 1.0 - 3.6 K/uL   Monocytes Relative 5 %   Monocytes Absolute 1.1 (H) 0.2 - 0.9 K/uL   Eosinophils Relative 1 %   Eosinophils Absolute 0.2 0 - 0.7 K/uL   Basophils Relative 0 %   Basophils Absolute 0.1 0 - 0.1 K/uL  Protime-INR     Status: None   Collection Time: 01/21/17 12:23 PM  Result Value Ref Range   Prothrombin Time 14.5 11.4 - 15.2 seconds   INR 1.12   Culture, blood (Routine x 2)     Status: None (Preliminary result)   Collection Time: 01/21/17 12:23 PM  Result Value Ref Range   Specimen Description  BLOOD LEFT FOREARM    Special Requests BOTTLES DRAWN AEROBIC AND ANAEROBIC BCAV    Culture NO GROWTH < 24 HOURS    Report Status PENDING   Culture, blood (Routine x 2)     Status: None (Preliminary result)   Collection Time: 01/21/17 12:36 PM  Result Value Ref Range   Specimen Description BLOOD RIGHT HAND    Special Requests      BOTTLES DRAWN AEROBIC AND ANAEROBIC Blood Culture adequate volume   Culture NO GROWTH < 24 HOURS    Report Status PENDING   Urinalysis, Complete w Microscopic     Status: Abnormal   Collection Time: 01/21/17 12:57 PM  Result Value Ref Range   Color, Urine YELLOW (A) YELLOW   APPearance CLEAR (A) CLEAR   Specific Gravity, Urine 1.012 1.005 - 1.030   pH 6.0 5.0 - 8.0   Glucose, UA NEGATIVE NEGATIVE mg/dL   Hgb urine dipstick NEGATIVE NEGATIVE   Bilirubin Urine NEGATIVE NEGATIVE   Ketones, ur 20 (A) NEGATIVE mg/dL   Protein, ur NEGATIVE NEGATIVE mg/dL   Nitrite NEGATIVE NEGATIVE   Leukocytes, UA TRACE (A) NEGATIVE   RBC / HPF 0-5 0 - 5 RBC/hpf   WBC, UA 0-5 0 - 5 WBC/hpf   Bacteria, UA NONE SEEN NONE SEEN   Squamous Epithelial / LPF 0-5 (A) NONE SEEN  CBC     Status: Abnormal   Collection Time: 01/22/17  4:11 AM  Result Value Ref Range   WBC 20.6 (H) 3.6 - 11.0 K/uL   RBC 3.42 (L) 3.80 - 5.20 MIL/uL   Hemoglobin 10.0 (L) 12.0 - 16.0 g/dL   HCT 30.8 (L) 35.0 - 47.0 %   MCV 90.0 80.0 - 100.0 fL  MCH 29.2 26.0 - 34.0 pg   MCHC 32.5 32.0 - 36.0 g/dL   RDW 16.9 (H) 11.5 - 14.5 %   Platelets 746 (H) 150 - 440 K/uL   No components found for: ESR, C REACTIVE PROTEIN MICRO: Recent Results (from the past 720 hour(s))  Blood culture (routine x 2)     Status: None   Collection Time: 12/30/16  8:56 PM  Result Value Ref Range Status   Specimen Description BLOOD RT FA  Final   Special Requests Blood Culture adequate volume  Final   Culture NO GROWTH 5 DAYS  Final   Report Status 01/04/2017 FINAL  Final  Blood culture (routine x 2)     Status: None    Collection Time: 12/30/16  8:56 PM  Result Value Ref Range Status   Specimen Description BLOOD LT [facility]  Final   Special Requests Blood Culture adequate volume  Final   Culture NO GROWTH 5 DAYS  Final   Report Status 01/04/2017 FINAL  Final  Anaerobic culture     Status: None   Collection Time: 12/31/16  1:44 AM  Result Value Ref Range Status   Specimen Description ABSCESS  Final   Special Requests   Final    INTRAABDOMINAL PATIENT ON FOLLOWING  LEVOQUIN AND FLAGUL    Culture   Final    FEW BACTEROIDES FRAGILIS BETA LACTAMASE POSITIVE Performed at [facility], [place] [address] [place], [place] [address]    Report Status 01/05/2017 FINAL  Final  Aerobic Culture (superficial specimen)     Status: None   Collection Time: 12/31/16  1:44 AM  Result Value Ref Range Status   Specimen Description ABSCESS  Final   Special Requests   Final    INTRAABDOMINAL PATIENT ON FOLLOWING  LEVOQUIN AND FLAGUL   Gram Stain   Final    MODERATE WBC PRESENT,BOTH PMN AND MONONUCLEAR MODERATE GRAM POSITIVE COCCI IN PAIRS IN CHAINS FEW GRAM VARIABLE ROD    Culture   Final    FEW ESCHERICHIA COLI MODERATE VIRIDANS STREPTOCOCCUS FEW EIKENELLA CORRODENS Usually susceptible to penicillin and other beta lactam agents,quinolones,macrolides and tetracyclines. Performed at [facility], [place] [address] [place], [place] [address]    Report Status 01/04/2017 FINAL  Final   Organism ID, Bacteria ESCHERICHIA COLI  Final      Susceptibility   Escherichia coli - MIC*    AMPICILLIN 4 SENSITIVE Sensitive     CEFAZOLIN <=4 SENSITIVE Sensitive     CEFEPIME <=1 SENSITIVE Sensitive     CEFTAZIDIME <=1 SENSITIVE Sensitive     CEFTRIAXONE <=1 SENSITIVE Sensitive     CIPROFLOXACIN <=0.25 SENSITIVE Sensitive     GENTAMICIN <=1 SENSITIVE Sensitive     IMIPENEM <=0.25 SENSITIVE Sensitive     TRIMETH/SULFA <=20 SENSITIVE Sensitive     AMPICILLIN/SULBACTAM 4 SENSITIVE Sensitive     PIP/TAZO <=4 SENSITIVE  Sensitive     Extended ESBL NEGATIVE Sensitive     * FEW ESCHERICHIA COLI  MRSA PCR Screening     Status: None   Collection Time: 12/31/16  5:45 AM  Result Value Ref Range Status   MRSA by PCR NEGATIVE NEGATIVE Final    Comment:        The GeneXpert MRSA Assay (FDA approved for NASAL specimens only), is one component of a comprehensive MRSA colonization surveillance program. It is not intended to diagnose MRSA infection nor to guide or monitor treatment for MRSA infections.   Aerobic/Anaerobic Culture (  surgical/deep wound)     Status: None   Collection Time: 01/05/17  3:35 PM  Result Value Ref Range Status   Specimen Description FLUID ABCESS  Final   Special Requests NONE  Final   Gram Stain   Final    ABUNDANT WBC PRESENT, PREDOMINANTLY PMN NO ORGANISMS SEEN    Culture   Final    No growth aerobically or anaerobically. Performed at [facility], [place] [address] [place], [place] [address]    Report Status 01/10/2017 FINAL  Final  Urine culture     Status: None   Collection Time: 01/21/17 12:13 PM  Result Value Ref Range Status   Specimen Description URINE, RANDOM  Final   Special Requests NONE  Final   Culture   Final    NO GROWTH Performed at [facility], [place] [address], [place], [place] [address]    Report Status 01/22/2017 FINAL  Final  Culture, blood (Routine x 2)     Status: None (Preliminary result)   Collection Time: 01/21/17 12:23 PM  Result Value Ref Range Status   Specimen Description BLOOD LEFT FOREARM  Final   Special Requests BOTTLES DRAWN AEROBIC AND ANAEROBIC BCAV  Final   Culture NO GROWTH < 24 HOURS  Final   Report Status PENDING  Incomplete  Culture, blood (Routine x 2)     Status: None (Preliminary result)   Collection Time: 01/21/17 12:36 PM  Result Value Ref Range Status   Specimen Description BLOOD RIGHT HAND  Final   Special Requests   Final    BOTTLES DRAWN AEROBIC AND ANAEROBIC Blood Culture adequate volume   Culture NO  GROWTH < 24 HOURS  Final   Report Status PENDING  Incomplete    IMAGING: Ct Angio Chest Pe W Or Wo Contrast  Result Date: 12/30/2016 CLINICAL DATA:  Acute onset of generalized weakness and lethargy. Shortness of breath, generalized abdominal pain and diarrhea. Vomiting. Status post recent bilateral salpingo-oophorectomy. Initial encounter. EXAM: CT ANGIOGRAPHY CHEST CT ABDOMEN AND PELVIS WITH CONTRAST TECHNIQUE: Multidetector CT imaging of the chest was performed using the standard protocol during bolus administration of intravenous contrast. Multiplanar CT image reconstructions and MIPs were obtained to evaluate the vascular anatomy. Multidetector CT imaging of the abdomen and pelvis was performed using the standard protocol during bolus administration of intravenous contrast. CONTRAST:  75 mL of Isovue 370 IV contrast COMPARISON:  None. FINDINGS: CTA CHEST FINDINGS Cardiovascular:  There is no evidence of pulmonary embolus. The heart remains normal in size. The thoracic aorta is unremarkable in appearance. No calcific atherosclerotic disease is seen. The great vessels are unremarkable in appearance. Mediastinum/Nodes: No mediastinal lymphadenopathy is seen. No pericardial effusion is identified. The thyroid gland is unremarkable. No axillary lymphadenopathy is appreciated. Lungs/Pleura: Scattered small nodules of varying size are noted throughout the lungs, measuring up to 1.1 cm in size and suspicious for metastatic disease. Mild bibasilar atelectasis is noted. No pleural effusion or pneumothorax is seen. Musculoskeletal: No acute osseous abnormalities are identified. The visualized musculature is unremarkable in appearance. The breasts are unremarkable in appearance, though the lateral left breast is incompletely characterized. Review of the MIP images confirms the above findings. CT ABDOMEN and PELVIS FINDINGS Hepatobiliary: A vague 1.4 cm hypodensity at the left hepatic lobe raises suspicion for  metastatic disease. The liver is otherwise unremarkable. The gallbladder is grossly unremarkable in appearance. The common bile duct remains normal in caliber. Pancreas: The pancreas is grossly unremarkable in appearance. No  definite pancreatic mass is identified. Spleen: The spleen is unremarkable in appearance. Adrenals/Urinary Tract: The adrenal glands are grossly unremarkable. The kidneys are within normal limits. There is no evidence of hydronephrosis. No renal or ureteral stones are identified. No perinephric stranding is seen. Stomach/Bowel: There appears to be perforation at the mid sigmoid colon, with a large contiguous collection of free fluid and air tracking across the right side of the abdomen and left hemipelvis, measuring 20-30 cm in size. Underlying diffuse mesenteric edema is noted, and scattered additional free air is seen tracking throughout the abdomen and pelvis. Findings are compatible with evolving abscess throughout the abdomen and pelvis. There is diffuse distention of small-bowel loops, concerning for underlying ileus. The stomach is grossly unremarkable in appearance. There is diffuse wall thickening along the ascending and proximal sigmoid colon, likely reflecting underlying infection. Vascular/Lymphatic: The abdominal aorta is unremarkable in appearance. The inferior vena cava is grossly unremarkable. Retroperitoneal nodes measure up to 1.2 cm in short axis. Diffuse retroperitoneal edema is seen. No definite pelvic sidewall lymphadenopathy is appreciated. Reproductive: Irregular enhancing nodules are seen arising at the uterus. These may reflect uterine fibroids, though metastatic disease cannot be excluded. The bladder is mildly distended and grossly unremarkable. Other: No additional soft tissue abnormalities are seen. Musculoskeletal: No acute osseous abnormalities are identified. The visualized musculature is unremarkable in appearance. Review of the MIP images confirms the above  findings. IMPRESSION: 1. Perforation at the mid sigmoid colon, with a large contiguous collection of free fluid and air tracking across the right side of the abdomen and left hemipelvis, measuring 20-30 cm in size. Underlying diffuse mesenteric edema, with scattered additional free air throughout the abdomen and pelvis. Findings compatible with evolving abscess throughout the abdomen and pelvis. 2. Diffuse wall thickening along the ascending and proximal sigmoid colon, likely reflecting underlying infection. 3. Diffuse distention of small-bowel loops likely reflects underlying ileus. 4. No evidence of pulmonary embolus. 5. Scattered small nodules of varying size throughout the lungs, measuring up to 1.1 cm in size. These are suspicious for metastatic disease, given the patient's known metastatic adenocarcinoma. 6. Vague 1.4 cm hypodensity at the left hepatic lobe raises suspicion for metastatic disease. 7. Retroperitoneal nodes measure up to 1.2 cm in short axis. This is nonspecific, though metastatic disease cannot be excluded. Underlying retroperitoneal edema noted. 8. Irregular enhancing nodules arising at the uterus. These may reflect uterine fibroids, though metastatic disease cannot be excluded. 9. No definite breast or pancreatic mass is seen. Given recent surgical pathology results, the primary source of the patient's adenocarcinoma is unclear. Critical Value/emergent results were called by telephone at the time of interpretation on 12/30/2016 at 10:51 pm to Dr. Carrie Mew, who verbally acknowledged these results. Electronically Signed   By: Garald Balding M.D.   On: 12/30/2016 23:05   Ct Abdomen Pelvis W Contrast  Result Date: 01/21/2017 CLINICAL DATA:  Tachycardia and leukocytosis with history of intra-abdominal abscess after rupture of bowel. Positive for cancer although etiology is unknown. EXAM: CT ABDOMEN AND PELVIS WITH CONTRAST TECHNIQUE: Multidetector CT imaging of the abdomen and pelvis was  performed using the standard protocol following bolus administration of intravenous contrast. CONTRAST:  160m ISOVUE-300 IOPAMIDOL (ISOVUE-300) INJECTION 61% COMPARISON:  01/08/2017 FINDINGS: Lower chest: Interval increase in size and number of pulmonary nodular opacities within the visualized lung bases, example series 4, image 11 in the right middle lobe now measuring 11 mm versus 5 mm on prior exam. No effusion or pneumothorax. Normal visualized cardiac  chamber size. No pericardial effusion. Hepatobiliary: Interval increase in size and number of peripherally enhancing hepatic lesions, new adjacent to the gallbladder fossa on series 2, image 30 measuring 10 mm and slightly larger dominant lesion in the left hepatic lobe estimated at 17 x 13 mm versus approximately 14 mm previously. Gallbladder is physiologically distended without calculi. Minimal intrahepatic ductal dilatation in the left hepatic lobe adjacent to the dominant presumed metastatic lesion. Adjacent to the right hepatic lobe along its caudal aspect is a 16 mm circumscribed hypodensity, smaller in appearance than on prior which had measured approximately 21 mm in diameter. Small abscess or necrotic metastasis might account for this appearance, series 2, image 40. Pancreas: Unremarkable. No pancreatic ductal dilatation or surrounding inflammatory changes. Spleen: Normal in size without focal abnormality. Adrenals/Urinary Tract: Normal bilateral adrenal glands and right kidney. New hypodensity in the interpolar aspect of the left kidney measuring 14 x 12 mm. Differential possibilities may include a renal neoplasm, focus of infection, metastasis or possibly infarct, series 2, image 32. No nephrolithiasis nor obstructive uropathy. The urinary bladder is physiologically distended. Stomach/Bowel: Physiologic distention of the stomach. Normal small bowel rotation. Left lower quadrant colostomy. Status post partial resection sigmoid. Vascular/Lymphatic: Small  mesenteric nodules are noted in the mid pelvis measuring up to 7 mm short axis and may represent reactive or metastatic adenopathy. The aorta and iliac vessels are unremarkable. Reproductive: Heterogeneous enhancement of uterus consistent with fibroids. Reportly, both ovaries have been removed. Other: Inflammatory/phlegmonous change about the uterus at site of prior pelvic abscesses. These collections are more thick-walled in appearance and minimally larger on the right measuring 30 x 14 mm versus 30 x 10 mm previously. 11 x 5 mm left-sided thick-walled abscess versus 24 x 15 mm previously. Percutaneous pigtail drainage catheter from right lower quadrant approach is noted coiled in the right hemipelvis anteriorly as before. Musculoskeletal: No acute or significant osseous findings. IMPRESSION: 1. Interval increase in size and number of pulmonary and hepatic lesions consistent with worsening metastatic disease. 2. New ill-defined 14 x 12 mm interpolar hypodensity associated with the left kidney which may reflect a focus of infection, infarct, or neoplasm/metastasis. 3. Phlegmonous change in the pelvis adjacent to the uterus with slightly thicker walled presumed abscess collections minimally larger on the right and smaller on the left. A pigtail percutaneous drainage catheter is seen just anterior to the larger right-sided collection. 4. Smaller circumscribed subhepatic 16 mm hypodensity versus 21 mm previously potentially representing a small abscess or necrotic metastasis. 5. Small subcentimeter mesenteric lymph nodes are noted as above described measuring up to 7 mm. Reactive versus metastatic. Electronically Signed   By: Ashley Royalty M.D.   On: 01/21/2017 15:37   Ct Abdomen Pelvis W Contrast  Result Date: 01/08/2017 CLINICAL DATA:  Diverticular abscess. EXAM: CT ABDOMEN AND PELVIS WITH CONTRAST TECHNIQUE: Multidetector CT imaging of the abdomen and pelvis was performed using the standard protocol following  bolus administration of intravenous contrast. CONTRAST:  22m ISOVUE-300 IOPAMIDOL (ISOVUE-300) INJECTION 61% COMPARISON:  CT scan of Jan 05, 2017. FINDINGS: Lower chest: Bibasilar pulmonary nodules are noted concerning for metastatic disease. Mild bibasilar subsegmental atelectasis is noted as well. Hepatobiliary: Probable sludge is noted within the gallbladder. Stable 1.4 cm low density with irregular margins is noted in the left hepatic lobe which is not changed compared to prior exam. This is concerning for possible metastatic focus. Pancreas: Unremarkable. No pancreatic ductal dilatation or surrounding inflammatory changes. Spleen: Normal in size without focal abnormality. Adrenals/Urinary  Tract: Adrenal glands are unremarkable. Kidneys are normal, without renal calculi, focal lesion, or hydronephrosis. Bladder is unremarkable. Stomach/Bowel: Visualized esophagus and stomach appear normal. Mildly dilated proximal small bowel loops are noted concerning for ileus. 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 Status post partial resection of sigmoid colon. Vascular/Lymphatic: No significant vascular findings are present. No enlarged abdominal or pelvic lymph nodes. Reproductive: Multiple small uterine fibroids are noted. Reportedly both ovaries have been removed. Other: There is been interval placement of percutaneous drain into pelvic abscess described on prior exam. The abscess is significantly decreased in size, although it measures 4.3 x 1.8 cm superiorly with 2.9 x 1.0 cm fluid collections seen to the right of the uterus and 2.4 x 1.5 cm fluid collection seen to the left of the uterus. Stable position of surgical drain is seen entering the right anterior abdominal wall, with distal tip in the right lower quadrant. Musculoskeletal: No acute or significant osseous findings. IMPRESSION: Bibasilar pulmonary nodules are again noted concerning for metastatic disease. Probable sludge seen within the gallbladder.  Stable 1.4 cm low density noted in left hepatic lobe concerning for possible metastatic disease. 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 Interval placement of percutaneous drain into the pelvic abscess described on prior exam. This abscess is significantly smaller compared to prior exam. Stable position of surgical drain seen involving right side of abdomen. Electronically Signed   By: Marijo Conception, M.D.   On: 01/08/2017 09:08   Ct Abdomen Pelvis W Contrast  Result Date: 01/05/2017 CLINICAL DATA:  History of adenocarcinoma. Recent perforation of the sigmoid colon with subsequent surgery. Leukocytosis. EXAM: CT ABDOMEN AND PELVIS WITH CONTRAST TECHNIQUE: Multidetector CT imaging of the abdomen and pelvis was performed using the standard protocol following bolus administration of intravenous contrast. CONTRAST:  169m ISOVUE-300 IOPAMIDOL (ISOVUE-300) INJECTION 61%, <See Chart> ISOVUE-300 IOPAMIDOL (ISOVUE-300) INJECTION 61% COMPARISON:  Dec 30, 2016 FINDINGS: Lower chest: Pulmonary nodularity again identified, suspicious for metastatic disease given history. There is a small left pleural effusion. Bibasilar opacities are likely atelectasis. No other acute abnormalities in the lung bases. Hepatobiliary: A low-attenuation lesion in the left hepatic lobe measures 14 mm, unchanged. A cyst is seen along the inferior right hepatic lobe. No other definitive solid masses are seen within the liver. The portal vein is patent. The gallbladder is unremarkable. No other abnormalities in the liver. Pancreas: Unremarkable. No pancreatic ductal dilatation or surrounding inflammatory changes. Spleen: Normal in size without focal abnormality. Adrenals/Urinary Tract: The adrenal glands, kidneys, and ureters are normal. Air in the bladder is likely from recent catheterization. Stomach/Bowel: The distal esophagus and stomach are normal. Small bowel loops are mildly prominent but not grossly dilated. Contrast does not reach the  terminal ileum but does traverse the small bowel into the region of the mid ileum. The patient is status post partial colonic resection. There is gas in the colon to the level of the left lower quadrant ostomy. Vascular/Lymphatic: The abdominal aorta is normal in caliber. Shotty nodes in the retroperitoneum are again identified. The previously identified 12 mm node to the left of the aorta on image 35 is unchanged. No new adenopathy identified. Reproductive: Fibroids in the uterus. By report, the ovaries have been removed. Other: There is free fluid within the abdomen and pelvis which could be postoperative. There is an organized collection of fluid as seen on axial image 73. There is peripheral enhancement as well some air within this collection. The main body of this collection measures  10.4 by 5.5 by 4.7 cm. A smaller collection of fluid is seen in the bowel mesenteries on series 2, image 53 measuring 4.7 by 2.1 cm. This is not nearly as organized and could simply be postoperative. No other definitive organized collections are seen. There is free air scattered throughout the abdomen and pelvis consistent with the recent postoperative status. Musculoskeletal: No acute changes. IMPRESSION: 1. There is an organizing collection of fluid measuring 10 x 6 x 5 cm in the pelvis. There is peripheral enhancement around this collection as well some air in the collection. Given history, the findings are suspicious for developing abscess. A smaller collection in the bowel mesentary is less specific and could simply be postoperative. 2. Pulmonary nodule suspicious for metastatic disease. 3. Low-attenuation lesion in the left hepatic lobe is nonspecific. However, given history, a metastatic lesion could have this appearance. 4. Probable mild ileus.  No definite obstruction. 5. Shotty nodes in the retroperitoneum could be reactive. However, given history, it would be difficult to completely exclude metastatic disease. These  results will be called to the ordering clinician or representative by the Radiologist Assistant, and communication documented in the PACS or zVision Dashboard. Electronically Signed   By: Dorise Bullion III M.D   On: 01/05/2017 12:26   Ct Abdomen Pelvis W Contrast  Result Date: 12/30/2016 CLINICAL DATA:  Acute onset of generalized weakness and lethargy. Shortness of breath, generalized abdominal pain and diarrhea. Vomiting. Status post recent bilateral salpingo-oophorectomy. Initial encounter. EXAM: CT ANGIOGRAPHY CHEST CT ABDOMEN AND PELVIS WITH CONTRAST TECHNIQUE: Multidetector CT imaging of the chest was performed using the standard protocol during bolus administration of intravenous contrast. Multiplanar CT image reconstructions and MIPs were obtained to evaluate the vascular anatomy. Multidetector CT imaging of the abdomen and pelvis was performed using the standard protocol during bolus administration of intravenous contrast. CONTRAST:  75 mL of Isovue 370 IV contrast COMPARISON:  None. FINDINGS: CTA CHEST FINDINGS Cardiovascular:  There is no evidence of pulmonary embolus. The heart remains normal in size. The thoracic aorta is unremarkable in appearance. No calcific atherosclerotic disease is seen. The great vessels are unremarkable in appearance. Mediastinum/Nodes: No mediastinal lymphadenopathy is seen. No pericardial effusion is identified. The thyroid gland is unremarkable. No axillary lymphadenopathy is appreciated. Lungs/Pleura: Scattered small nodules of varying size are noted throughout the lungs, measuring up to 1.1 cm in size and suspicious for metastatic disease. Mild bibasilar atelectasis is noted. No pleural effusion or pneumothorax is seen. Musculoskeletal: No acute osseous abnormalities are identified. The visualized musculature is unremarkable in appearance. The breasts are unremarkable in appearance, though the lateral left breast is incompletely characterized. Review of the MIP images  confirms the above findings. CT ABDOMEN and PELVIS FINDINGS Hepatobiliary: A vague 1.4 cm hypodensity at the left hepatic lobe raises suspicion for metastatic disease. The liver is otherwise unremarkable. The gallbladder is grossly unremarkable in appearance. The common bile duct remains normal in caliber. Pancreas: The pancreas is grossly unremarkable in appearance. No definite pancreatic mass is identified. Spleen: The spleen is unremarkable in appearance. Adrenals/Urinary Tract: The adrenal glands are grossly unremarkable. The kidneys are within normal limits. There is no evidence of hydronephrosis. No renal or ureteral stones are identified. No perinephric stranding is seen. Stomach/Bowel: There appears to be perforation at the mid sigmoid colon, with a large contiguous collection of free fluid and air tracking across the right side of the abdomen and left hemipelvis, measuring 20-30 cm in size. Underlying diffuse mesenteric edema is noted,  and scattered additional free air is seen tracking throughout the abdomen and pelvis. Findings are compatible with evolving abscess throughout the abdomen and pelvis. There is diffuse distention of small-bowel loops, concerning for underlying ileus. The stomach is grossly unremarkable in appearance. There is diffuse wall thickening along the ascending and proximal sigmoid colon, likely reflecting underlying infection. Vascular/Lymphatic: The abdominal aorta is unremarkable in appearance. The inferior vena cava is grossly unremarkable. Retroperitoneal nodes measure up to 1.2 cm in short axis. Diffuse retroperitoneal edema is seen. No definite pelvic sidewall lymphadenopathy is appreciated. Reproductive: Irregular enhancing nodules are seen arising at the uterus. These may reflect uterine fibroids, though metastatic disease cannot be excluded. The bladder is mildly distended and grossly unremarkable. Other: No additional soft tissue abnormalities are seen. Musculoskeletal: No  acute osseous abnormalities are identified. The visualized musculature is unremarkable in appearance. Review of the MIP images confirms the above findings. IMPRESSION: 1. Perforation at the mid sigmoid colon, with a large contiguous collection of free fluid and air tracking across the right side of the abdomen and left hemipelvis, measuring 20-30 cm in size. Underlying diffuse mesenteric edema, with scattered additional free air throughout the abdomen and pelvis. Findings compatible with evolving abscess throughout the abdomen and pelvis. 2. Diffuse wall thickening along the ascending and proximal sigmoid colon, likely reflecting underlying infection. 3. Diffuse distention of small-bowel loops likely reflects underlying ileus. 4. No evidence of pulmonary embolus. 5. Scattered small nodules of varying size throughout the lungs, measuring up to 1.1 cm in size. These are suspicious for metastatic disease, given the patient's known metastatic adenocarcinoma. 6. Vague 1.4 cm hypodensity at the left hepatic lobe raises suspicion for metastatic disease. 7. Retroperitoneal nodes measure up to 1.2 cm in short axis. This is nonspecific, though metastatic disease cannot be excluded. Underlying retroperitoneal edema noted. 8. Irregular enhancing nodules arising at the uterus. These may reflect uterine fibroids, though metastatic disease cannot be excluded. 9. No definite breast or pancreatic mass is seen. Given recent surgical pathology results, the primary source of the patient's adenocarcinoma is unclear. Critical Value/emergent results were called by telephone at the time of interpretation on 12/30/2016 at 10:51 pm to Dr. Carrie Mew, who verbally acknowledged these results. Electronically Signed   By: Garald Balding M.D.   On: 12/30/2016 23:05   Ct Image Guided Drainage By Percutaneous Catheter  Result Date: 01/05/2017 INDICATION: 52 year old female with a peripherally enhancing fluid collection and increasing  leukocytosis status post sigmoid colon resection, left lower quadrant colostomy creation and Hartmann's pouch procedure for perforated sigmoid colon. Imaging findings are concerning for postoperative abscess. EXAM: CT GUIDED DRAINAGE OF right lower quadrant intraperitoneal abdominal ABSCESS MEDICATIONS: The patient is currently admitted to the hospital and receiving intravenous antibiotics. The antibiotics were administered within an appropriate time frame prior to the initiation of the procedure. ANESTHESIA/SEDATION: 2 mg IV Versed 100 mcg IV Fentanyl Moderate Sedation Time:  20 minutes The patient was continuously monitored during the procedure by the interventional radiology nurse under my direct supervision. COMPLICATIONS: None immediate. TECHNIQUE: Informed written consent was obtained from the patient after a thorough discussion of the procedural risks, benefits and alternatives. All questions were addressed. Maximal Sterile Barrier Technique was utilized including caps, mask, sterile gowns, sterile gloves, sterile drape, hand hygiene and skin antiseptic. A timeout was performed prior to the initiation of the procedure. PROCEDURE: The operative field was prepped with Chlorhexidine in a sterile fashion, and a sterile drape was applied covering the operative field. A sterile  gown and sterile gloves were used for the procedure. Local anesthesia was provided with 1% Lidocaine. A small dermatotomy was made. Using intermittent CT guidance, an 18 gauge trocar needle was advanced through the right lower quadrant abdominal wall taking care to avoid the inferior epigastric vessels. The needle was advanced into the fluid collection. An Amplatz wire was then advanced the needle and coiled within the fluid collection. The needle was removed. The skin tract was dilated to 88 [place] and a Cook 12 [place] all-purpose drainage catheter was advanced over the wire and formed in the fluid collection. Aspiration yields  approximately 40 mL of turbid serosanguineous fluid containing internal debris. A sample was sent to the laboratory for culture. Post drain placement axial CT imaging demonstrates excellent position of the drainage catheter and near complete resolution of the fluid collection. The catheter was secured to the skin with 0 Prolene suture and a sterile bandage. The patient tolerated the procedure well. FINDINGS: Approximately 40 mL turbid serosanguineous fluid with internal debris. IMPRESSION: Technically successful placement of a 61 French percutaneous drain into the right lower quadrant postoperative fluid collection with aspiration as described above. Maintain tube to JP bulb suction. When drain output is minimal, recommend repeat CT scan of the pelvis with intravenous contrast prior to drain removal. Electronically Signed   By: Jacqulynn Cadet M.D.   On: 01/05/2017 16:00    Assessment:   Kayona Foor is a 51 y.o. female with recent dx of metastatic adenocarcinoma of unknown primary , s.p colonic perforation and Hartmanns pouch procedure May 8 complicated by intra-abdominal abscess. May 8 cx grew E coli, Eikenella, viridans strep.  May 13 had placement of drain with findings of 40 ML fluid and debris - cx negative.  Dced on cipro and flagyl but had worsening wbc up to 24 K so readmitted. Started on cefepime but developed a rash. CT done showed worsening metastatic disease, phlegmon in pelvis with presummed abscess collections with pigtail cath in place.  She has been seen by surgery who feels no need for repeat surgical drainage nor drain placement. Discussed with Dr Janeth Rase who plans to start immunotherapy next week hopefully.   Recommendations I would suggest we try meropenem to see if she can tolerate it. This will provide coverage of all isolated organisms and also any resistant organisms which may be contributing to persistence of the abscess.  If she tolerates will plan on PICC Placement  and 2-4 weeks IV abx therapy. May yet require repeat drain placement. I think given the rapid progression of the tumors I would not delay immunotherapy until this infection is healed as will take several weeks and may progress to needing repeat drainage whether or not she gets immunotherapy.  Thank you very much for allowing me to participate in the care of this patient. Please call with questions.   Cheral Marker. Ola Spurr, MD

## 2017-01-22 NOTE — Progress Notes (Addendum)
CC: metastatic Ca unknown primary  Subjective: Only issue is rash after cefepime, no abd pain, no N/V + PO Wbc down Creat ok  Objective: Vital signs in last 24 hours: Temp:  [98.6 F (37 C)-99.9 F (37.7 C)] 98.9 F (37.2 C) (05/30 0414) Pulse Rate:  [107-133] 119 (05/30 0414) Resp:  [19-32] 20 (05/30 0414) BP: (103-146)/(55-101) 103/56 (05/30 0414) SpO2:  [96 %-100 %] 98 % (05/30 0414) Weight:  [55.3 kg (121 lb 14.4 oz)-61.7 kg (136 lb)] 55.3 kg (121 lb 14.4 oz) (05/30 0304) Last BM Date: 01/20/17  Intake/Output from previous day: 05/29 0701 - 05/30 0700 In: 250 [IV Piggyback:250] Out: -  Intake/Output this shift: No intake/output data recorded.  Physical exam: NAD, awake alert Abd: soft, NT, wound vac in place, no infection, no peritonitis. Ostomy pink and patent. Drains in place serous fluid. Ext: no edema, well p erfused  Lab Results: CBC   Recent Labs  01/21/17 1223 01/22/17 0411  WBC 24.8* 20.6*  HGB 9.8* 10.0*  HCT 29.9* 30.8*  PLT 678* 746*   BMET  Recent Labs  01/21/17 1223  NA 129*  K 2.6*  CL 87*  CO2 32  GLUCOSE 112*  BUN 14  CREATININE 0.46  CALCIUM 8.3*   PT/INR  Recent Labs  01/21/17 1223  LABPROT 14.5  INR 1.12   ABG No results for input(s): PHART, HCO3 in the last 72 hours.  Invalid input(s): PCO2, PO2  Studies/Results: Ct Abdomen Pelvis W Contrast  Result Date: 01/21/2017 CLINICAL DATA:  Tachycardia and leukocytosis with history of intra-abdominal abscess after rupture of bowel. Positive for cancer although etiology is unknown. EXAM: CT ABDOMEN AND PELVIS WITH CONTRAST TECHNIQUE: Multidetector CT imaging of the abdomen and pelvis was performed using the standard protocol following bolus administration of intravenous contrast. CONTRAST:  167mL ISOVUE-300 IOPAMIDOL (ISOVUE-300) INJECTION 61% COMPARISON:  01/08/2017 FINDINGS: Lower chest: Interval increase in size and number of pulmonary nodular opacities within the visualized  lung bases, example series 4, image 11 in the right middle lobe now measuring 11 mm versus 5 mm on prior exam. No effusion or pneumothorax. Normal visualized cardiac chamber size. No pericardial effusion. Hepatobiliary: Interval increase in size and number of peripherally enhancing hepatic lesions, new adjacent to the gallbladder fossa on series 2, image 30 measuring 10 mm and slightly larger dominant lesion in the left hepatic lobe estimated at 17 x 13 mm versus approximately 14 mm previously. Gallbladder is physiologically distended without calculi. Minimal intrahepatic ductal dilatation in the left hepatic lobe adjacent to the dominant presumed metastatic lesion. Adjacent to the right hepatic lobe along its caudal aspect is a 16 mm circumscribed hypodensity, smaller in appearance than on prior which had measured approximately 21 mm in diameter. Small abscess or necrotic metastasis might account for this appearance, series 2, image 40. Pancreas: Unremarkable. No pancreatic ductal dilatation or surrounding inflammatory changes. Spleen: Normal in size without focal abnormality. Adrenals/Urinary Tract: Normal bilateral adrenal glands and right kidney. New hypodensity in the interpolar aspect of the left kidney measuring 14 x 12 mm. Differential possibilities may include a renal neoplasm, focus of infection, metastasis or possibly infarct, series 2, image 32. No nephrolithiasis nor obstructive uropathy. The urinary bladder is physiologically distended. Stomach/Bowel: Physiologic distention of the stomach. Normal small bowel rotation. Left lower quadrant colostomy. Status post partial resection sigmoid. Vascular/Lymphatic: Small mesenteric nodules are noted in the mid pelvis measuring up to 7 mm short axis and may represent reactive or metastatic adenopathy. The aorta  and iliac vessels are unremarkable. Reproductive: Heterogeneous enhancement of uterus consistent with fibroids. Reportly, both ovaries have been removed.  Other: Inflammatory/phlegmonous change about the uterus at site of prior pelvic abscesses. These collections are more thick-walled in appearance and minimally larger on the right measuring 30 x 14 mm versus 30 x 10 mm previously. 11 x 5 mm left-sided thick-walled abscess versus 24 x 15 mm previously. Percutaneous pigtail drainage catheter from right lower quadrant approach is noted coiled in the right hemipelvis anteriorly as before. Musculoskeletal: No acute or significant osseous findings. IMPRESSION: 1. Interval increase in size and number of pulmonary and hepatic lesions consistent with worsening metastatic disease. 2. New ill-defined 14 x 12 mm interpolar hypodensity associated with the left kidney which may reflect a focus of infection, infarct, or neoplasm/metastasis. 3. Phlegmonous change in the pelvis adjacent to the uterus with slightly thicker walled presumed abscess collections minimally larger on the right and smaller on the left. A pigtail percutaneous drainage catheter is seen just anterior to the larger right-sided collection. 4. Smaller circumscribed subhepatic 16 mm hypodensity versus 21 mm previously potentially representing a small abscess or necrotic metastasis. 5. Small subcentimeter mesenteric lymph nodes are noted as above described measuring up to 7 mm. Reactive versus metastatic. Electronically Signed   By: Ashley Royalty M.D.   On: 01/21/2017 15:37    Anti-infectives: Anti-infectives    Start     Dose/Rate Route Frequency Ordered Stop   01/22/17 1000  ciprofloxacin (CIPRO) IVPB 400 mg     400 mg 200 mL/hr over 60 Minutes Intravenous Every 12 hours 01/22/17 0806     01/22/17 0200  metroNIDAZOLE (FLAGYL) IVPB 500 mg     500 mg 100 mL/hr over 60 Minutes Intravenous Every 8 hours 01/21/17 1905     01/22/17 0200  ceFEPIme (MAXIPIME) 2 g in dextrose 5 % 50 mL IVPB  Status:  Discontinued     2 g 100 mL/hr over 30 Minutes Intravenous Every 8 hours 01/21/17 1918 01/22/17 0000   01/21/17  2200  ceFEPIme (MAXIPIME) 2 g in dextrose 5 % 50 mL IVPB  Status:  Discontinued     2 g 100 mL/hr over 30 Minutes Intravenous Every 8 hours 01/21/17 1905 01/21/17 1917   01/21/17 1745  ceFEPIme (MAXIPIME) 2 g in dextrose 5 % 50 mL IVPB     2 g 100 mL/hr over 30 Minutes Intravenous STAT 01/21/17 1737 01/21/17 2134   01/21/17 1744  ceFEPIme (MAXIPIME) 1 GM/50ML IVPB  Status:  Discontinued    Comments:  C[place]ne, Amy: cabinet override      01/21/17 1744 01/21/17 1740   01/21/17 1645  levofloxacin (LEVAQUIN) IVPB 750 mg  Status:  Discontinued     750 mg 100 mL/hr over 90 Minutes Intravenous  Once 01/21/17 1630 01/21/17 1740   01/21/17 1645  metroNIDAZOLE (FLAGYL) IVPB 500 mg     500 mg 100 mL/hr over 60 Minutes Intravenous  Once 01/21/17 1630 01/21/17 2229      Assessment/Plan: Switch to quinolone given the allergic reaction to cefepime, await ID recs No surgical intervention Continue JP and Wound vac Cbc bmp in am  Caroleen Hamman, MD, FACS  01/22/2017

## 2017-01-22 NOTE — Consult Note (Signed)
[facility] Nurse wound consult note Reason for Consult: Midline surgical wound.  NPWT (VAC) therapy to be placed.  HAd [facility] home unit in place.  LLQ Colostomy Wound type:Surgical Pressure Injury POA: N/A Measurement:16 cm x 4 cm x 0.8 cm  Wound TLX:BWIOM red granulation tissue noted.  Drainage (amount, consistency, odor) Minimal serosanguinous drainage in canister Periwound:Intact.  LLQ colostomy Dressing procedure/placement/frequency:Cleanse abdominal wound with NS and pat dry.  Fill wound with black foam.  Cover with drape.  Trim hair as needed. Change Mon/Wed/Fri.  Setting 80 mmHG.  [facility] Nurse ostomy consult note Stoma type/location: LLQ colostomy Stomal assessment/size: 1 1/8 " round pink and moist  Producing brown soft stool Peristomal assessment: itnact Treatment options for stomal/peristomal skin:none.  Barrier perimeter trimmed to accomodate NPWT dressing.   Output softbrown stool Ostomy pouching:2pc. 2 1/4" pouch   Education provided: Patient has been emptying pouch at home.  Independent with care.  Enrolled patient in [facility] program: Yes previous admission.  [facility] team will follow.   Domenic Moras RN BSN [facility] Pager [phone]

## 2017-01-22 NOTE — Progress Notes (Signed)
START OFF PATHWAY REGIMEN - [Other Dx]   [id number]:Pembrolizumab 200 mg q21 Days:   A cycle is 21 days:     Pembrolizumab   **Always confirm dose/schedule in your pharmacy ordering system**      Intent of Therapy: Non-Curative / Palliative Intent, Discussed with Patient

## 2017-01-22 NOTE — Progress Notes (Signed)
[facility] responded to an OR for an AD. Pt was educated and will complete after consultation with family.    01/22/17 1500  Clinical Encounter Type  Visited With Patient;Patient and family together  Visit Type Initial;Spiritual support  Referral From Nurse  Consult/Referral To Chaplain  Spiritual Encounters  Spiritual Needs Literature;Prayer

## 2017-01-22 NOTE — Progress Notes (Addendum)
[facility] NAME: Anna [name]    MR#:  [id number]  DATE OF BIRTH:  August 22, 1966  SUBJECTIVE:  CHIEF COMPLAINT:   Chief Complaint  Patient presents with  . Fever  . 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 She was discharged on Cipro and Flagyl, given her multiple pelvic abscesses which were drained during the last admission. Now she was readmitted due to elevated white blood cell count and fever. She was initiated on cefepime and Flagyl, however, developed a rash due to cefepime, which was stopped. Repeat his CT scan of abdomen and pelvis revealed pelvic phlegmon, worsening metastatic disease. Patient denies any abdominal pain, admits of rash  Review of Systems  Constitutional: Negative for chills, fever and weight loss.  HENT: Negative for congestion.   Eyes: Negative for blurred vision and double vision.  Respiratory: Negative for cough, sputum production, shortness of breath and wheezing.   Cardiovascular: Negative for chest pain, palpitations, orthopnea, leg swelling and PND.  Gastrointestinal: Negative for abdominal pain, blood in stool, constipation, diarrhea, nausea and vomiting.  Genitourinary: Negative for dysuria, frequency, hematuria and urgency.  Musculoskeletal: Negative for falls.  Skin: Positive for itching and rash.  Neurological: Negative for dizziness, tremors, focal weakness and headaches.  Endo/Heme/Allergies: Does not bruise/bleed easily.  Psychiatric/Behavioral: Negative for depression. The patient does not have insomnia.     VITAL SIGNS: Blood pressure 108/66, pulse (!) 120, temperature 98.6 F (37 C), temperature source Oral, resp. rate 18, height 5\' 3"  (1.6 m), weight 55.3 kg  (121 lb 14.4 oz), last menstrual period 03/01/2015, SpO2 98 %.  PHYSICAL EXAMINATION:   GENERAL:  51 y.o.-year-old patient lying in the bed with no acute distress.  EYES: Pupils equal, round, reactive to light and accommodation. No scleral icterus. Extraocular muscles intact.  HEENT: Head atraumatic, normocephalic. Oropharynx and nasopharynx clear.  NECK:  Supple, no jugular venous distention. No thyroid enlargement, no tenderness.  LUNGS: Normal breath sounds bilaterally, no wheezing, rales,rhonchi or crepitation. No use of accessory muscles of respiration.  CARDIOVASCULAR: S1, S2 normal. No murmurs, rubs, or gallops.  ABDOMEN: Soft, nontender, nondistended. Bowel sounds present. No organomegaly or mass.  EXTREMITIES: No pedal edema, cyanosis, or clubbing.  NEUROLOGIC: Cranial nerves II through XII are intact. Muscle strength 5/5 in all extremities. Sensation intact. Gait not checked.  PSYCHIATRIC: The patient is alert and oriented x 3.  SKIN: Patchy erythematous rash all over the body, no other lesions, or ulcer.   ORDERS/RESULTS REVIEWED:   CBC  Recent Labs Lab 01/21/17 1223 01/22/17 0411  WBC 24.8* 20.6*  HGB 9.8* 10.0*  HCT 29.9* 30.8*  PLT 678* 746*  MCV 90.4 90.0  MCH 29.6 29.2  MCHC 32.7 32.5  RDW 16.7* 16.9*  LYMPHSABS 0.8*  --   MONOABS 1.1*  --   EOSABS 0.2  --   BASOSABS 0.1  --    ------------------------------------------------------------------------------------------------------------------  Chemistries   Recent Labs Lab 01/21/17 1223  NA 129*  K 2.6*  CL 87*  CO2 32  GLUCOSE 112*  BUN 14  CREATININE 0.46  CALCIUM 8.3*  AST 27  ALT 12*  ALKPHOS 56  BILITOT 0.6   ------------------------------------------------------------------------------------------------------------------ estimated creatinine clearance is 68 mL/min (by C-G formula based on SCr of 0.46  mg/dL). ------------------------------------------------------------------------------------------------------------------ No results for  input(s): TSH, T4TOTAL, T3FREE, THYROIDAB in the last 72 hours.  Invalid input(s): FREET3  Cardiac Enzymes No results for input(s): CKMB, TROPONINI, MYOGLOBIN in the last 168 hours.  Invalid input(s): CK ------------------------------------------------------------------------------------------------------------------ Invalid input(s): POCBNP ---------------------------------------------------------------------------------------------------------------  RADIOLOGY: Ct Abdomen Pelvis W Contrast  Result Date: 01/21/2017 CLINICAL DATA:  Tachycardia and leukocytosis with history of intra-abdominal abscess after rupture of bowel. Positive for cancer although etiology is unknown. EXAM: CT ABDOMEN AND PELVIS WITH CONTRAST TECHNIQUE: Multidetector CT imaging of the abdomen and pelvis was performed using the standard protocol following bolus administration of intravenous contrast. CONTRAST:  176mL ISOVUE-300 IOPAMIDOL (ISOVUE-300) INJECTION 61% COMPARISON:  01/08/2017 FINDINGS: Lower chest: Interval increase in size and number of pulmonary nodular opacities within the visualized lung bases, example series 4, image 11 in the right middle lobe now measuring 11 mm versus 5 mm on prior exam. No effusion or pneumothorax. Normal visualized cardiac chamber size. No pericardial effusion. Hepatobiliary: Interval increase in size and number of peripherally enhancing hepatic lesions, new adjacent to the gallbladder fossa on series 2, image 30 measuring 10 mm and slightly larger dominant lesion in the left hepatic lobe estimated at 17 x 13 mm versus approximately 14 mm previously. Gallbladder is physiologically distended without calculi. Minimal intrahepatic ductal dilatation in the left hepatic lobe adjacent to the dominant presumed metastatic lesion. Adjacent to the right hepatic lobe  along its caudal aspect is a 16 mm circumscribed hypodensity, smaller in appearance than on prior which had measured approximately 21 mm in diameter. Small abscess or necrotic metastasis might account for this appearance, series 2, image 40. Pancreas: Unremarkable. No pancreatic ductal dilatation or surrounding inflammatory changes. Spleen: Normal in size without focal abnormality. Adrenals/Urinary Tract: Normal bilateral adrenal glands and right kidney. New hypodensity in the interpolar aspect of the left kidney measuring 14 x 12 mm. Differential possibilities may include a renal neoplasm, focus of infection, metastasis or possibly infarct, series 2, image 32. No nephrolithiasis nor obstructive uropathy. The urinary bladder is physiologically distended. Stomach/Bowel: Physiologic distention of the stomach. Normal small bowel rotation. Left lower quadrant colostomy. Status post partial resection sigmoid. Vascular/Lymphatic: Small mesenteric nodules are noted in the mid pelvis measuring up to 7 mm short axis and may represent reactive or metastatic adenopathy. The aorta and iliac vessels are unremarkable. Reproductive: Heterogeneous enhancement of uterus consistent with fibroids. Reportly, both ovaries have been removed. Other: Inflammatory/phlegmonous change about the uterus at site of prior pelvic abscesses. These collections are more thick-walled in appearance and minimally larger on the right measuring 30 x 14 mm versus 30 x 10 mm previously. 11 x 5 mm left-sided thick-walled abscess versus 24 x 15 mm previously. Percutaneous pigtail drainage catheter from right lower quadrant approach is noted coiled in the right hemipelvis anteriorly as before. Musculoskeletal: No acute or significant osseous findings. IMPRESSION: 1. Interval increase in size and number of pulmonary and hepatic lesions consistent with worsening metastatic disease. 2. New ill-defined 14 x 12 mm interpolar hypodensity associated with the left  kidney which may reflect a focus of infection, infarct, or neoplasm/metastasis. 3. Phlegmonous change in the pelvis adjacent to the uterus with slightly thicker walled presumed abscess collections minimally larger on the right and smaller on the left. A pigtail percutaneous drainage catheter is seen just anterior to the larger right-sided collection. 4. Smaller circumscribed subhepatic 16 mm hypodensity versus 21 mm previously potentially representing a small abscess or necrotic metastasis. 5. Small subcentimeter mesenteric lymph nodes are noted as above described measuring up to  7 mm. Reactive versus metastatic. Electronically Signed   By: Ashley Royalty M.D.   On: 01/21/2017 15:37    EKG:  Orders placed or performed during the hospital encounter of 12/30/16  . EKG 12-Lead  . EKG 12-Lead  . ED EKG  . ED EKG  . EKG    ASSESSMENT AND PLAN:  Active Problems:   Sepsis ([place])  #1. Sepsis due to pelvic abscesses , Continue patient on Cipro and Flagyl, infectious disease specialist consultation is requested, blood cultures are negative so far, urine culture showed no growth. The patient was seen by a surgeon, who recommended antibiotic therapy, but felt that patient is not a surgical candidate. The patient was also evaluated and seen by GYN oncologist, who recommended chemotherapy, but only after infection subsides. Get LDH #2. Leukocytosis, improving with therapy #3. Hyponatremia, hypokalemia, initiate patient on IV fluids with potassium supplements, follow BMP later today #4. Hypomagnesemia, supplement intravenously, recheck magnesium level tomorrow morning #5. Drug eruption, continue patient on Benadryl,  continue prednisone  #6 sinus tachycardia, likely due to sepsis, get VQ to r/o PE, unable to use CTA due to recent IV dye load   Management plans discussed with the patient, family and they are in agreement.   DRUG ALLERGIES:  Allergies  Allergen Reactions  . Cefepime Rash  . Penicillins  Rash    As a child. Has patient had a PCN reaction causing immediate rash, facial/tongue/throat swelling, SOB or lightheadedness with hypotension: Unknown Has patient had a PCN reaction causing severe rash involving mucus membranes or skin necrosis: Unknown Has patient had a PCN reaction that required hospitalization: No Has patient had a PCN reaction occurring within the last 10 years: No If all of the above answers are "NO", then may proceed with Cephalosporin use.     CODE STATUS:     Code Status Orders        Start     Ordered   01/21/17 2101  Full code  Continuous     01/21/17 2100    Code Status History    Date Active Date Inactive Code Status Order ID Comments User Context   12/31/2016  3:58 AM 01/10/2017  4:29 PM Full Code [id number]  Jules Husbands, MD Inpatient   12/30/2016 10:48 PM 12/31/2016  3:58 AM Full Code [id number]  Schermerhorn, Gwen Her, MD ED   12/20/2016 11:16 AM 12/21/2016  1:51 PM Full Code [id number]  Schermerhorn, Gwen Her, MD Inpatient      TOTAL TIME TAKING CARE OF THIS PATIENT: 40 minutes.    Theodoro Grist M.D on 01/22/2017 at 2:41 PM  Between 7am to 6pm - Pager - [phone]  After 6pm go to [email or web address] - password [email or web address] [facility]  [facility]  Office  [phone]  CC: Primary care physician; McLean-Scocuzza, Nino Glow, MD

## 2017-01-22 NOTE — Consult Note (Signed)
[facility] NOTE  Patient Care Team: McLean-Scocuzza, Nino Glow, MD as PCP - General (Internal Medicine)  CHIEF COMPLAINTS/PURPOSE OF CONSULTATION:  Carcinoma of unknown primary  HISTORY OF PRESENTING ILLNESS:  Anna [name] 51 y.o.  female recently diagnosed with carcinoma of unknown primary- status post left oophorectomy/ peritoneal nodule biopsy- which was unfortunately complicated by sigmoid perforation. Patient ended up having a colostomy/bowel repair- noted to have bowel involvement with malignancy. Postoperatively course was complicated by- pelvic abscess/ needing IR guided drain. Patient was discharged home on Cipro and Flagyl.  However, patient's interim noted to have low-grade fevers; episodes of sweating/tachycardia- patient was admitted to the hospital by PCP for sepsis/further workup.  Patient since being hospital has been started on IV antibiotics cefepime; for which she developed a rash. She is currently switched over to meropenem. The white count has come down from 24,000 to 20,000 this morning. Fever slightly improved  Overall feels poorly. No nausea no vomiting. Colostomy working well.  ROS: A complete 10 point review of system is done which is negative except mentioned above in history of present illness  MEDICAL HISTORY:  Past Medical History:  Diagnosis Date  . Cancer ([place])   . Cancer with unknown primary site [facility])   . Colon polyp   . Diverticulosis   . Fibroid uterus   . Heme positive stool 08/01/2014  . HH (hiatus hernia)   . Large bowel perforation ([place]) 12/31/2016  . Metastatic adenocarcinoma ([place]) 12/20/2016  . Pelvic pain in female 01/13/2017  . Perforation of sigmoid colon ([place])     SURGICAL HISTORY: Past Surgical History:  Procedure Laterality Date  . COLONOSCOPY    . CYSTOSCOPY  12/20/2016   Procedure: CYSTOSCOPY;  Surgeon: Boykin Nearing, MD;  Location: [facility] ORS;  Service: Gynecology;;  . ESOPHAGOGASTRODUODENOSCOPY     . LAPAROSCOPIC BILATERAL SALPINGECTOMY Bilateral 12/20/2016   Procedure: LAPAROSCOPIC BILATERAL SALPINGOOPHERECTOMY,  Excision of anterior 5 cm calcified uterine fibroid;  Surgeon: Boykin Nearing, MD;  Location: [facility] ORS;  Service: Gynecology;  Laterality: Bilateral;  . LAPAROSCOPIC LYSIS OF ADHESIONS  12/20/2016   Procedure: LAPAROSCOPIC LYSIS OF ADHESIONS WITH DISSECTION OF LEFT TUBE AND OVARY.;  Surgeon: Olean Ree, MD;  Location: [facility] ORS;  Service: General;;  . LAPAROTOMY N/A 12/31/2016   Procedure: EXPLORATORY LAPAROTOMY, sigmoid colectomy, removal of omentum, colostomy;  Surgeon: Jules Husbands, MD;  Location: [facility] ORS;  Service: General;  Laterality: N/A;  . SIGMOIDOSCOPY  12/20/2016   Procedure: RIGID SIGMOIDOSCOPY;  Surgeon: Olean Ree, MD;  Location: [facility] ORS;  Service: General;;  . TONSILLECTOMY      SOCIAL HISTORY: Social History   Social History  . Marital status: Married    Spouse name: N/A  . Number of children: N/A  . Years of education: N/A   Occupational History  . Not on file.   Social History Main Topics  . Smoking status: Never Smoker  . Smokeless tobacco: Never Used  . Alcohol use No  . Drug use: No  . Sexual activity: Not on file   Other Topics Concern  . Not on file   Social History Narrative  . No narrative on file    FAMILY HISTORY: Family History  Problem Relation Age of Onset  . Colon cancer Mother 80  . Diabetes Mother   . Asthma Father   . Breast cancer Neg Hx     ALLERGIES:  is allergic to cefepime and penicillins.  MEDICATIONS:  Current Facility-Administered Medications  Medication Dose Route Frequency Provider Last Rate Last Dose  . 0.9 % NaCl with KCl 20 mEq/ L  infusion   Intravenous Continuous Theodoro Grist, MD 75 mL/hr at 01/22/17 1719    . acetaminophen (TYLENOL) tablet 650 mg  650 mg Oral Q6H PRN Fritzi Mandes, MD       Or  . acetaminophen (TYLENOL) suppository 650 mg  650 mg Rectal Q6H PRN Fritzi Mandes, MD      .  diphenhydrAMINE (BENADRYL) capsule 25 mg  25 mg Oral Q6H PRN Hugelmeyer, Alexis, DO   25 mg at 01/21/17 2211  . enoxaparin (LOVENOX) injection 40 mg  40 mg Subcutaneous Q24H Fritzi Mandes, MD   40 mg at 01/21/17 2211  . magnesium sulfate IVPB 4 g 100 mL  4 g Intravenous BID Theodoro Grist, MD   Stopped at 01/22/17 1753  . meropenem (MERREM) 1 g in sodium chloride 0.9 % 100 mL IVPB  1 g Intravenous Q8H Leonel Ramsay, MD   Stopped at 01/22/17 1753  . metoprolol succinate (TOPROL-XL) 24 hr tablet 25 mg  25 mg Oral Daily Fritzi Mandes, MD   25 mg at 01/22/17 0955  . oxyCODONE-acetaminophen (PERCOCET/ROXICET) 5-325 MG per tablet 1 tablet  1 tablet Oral Q6H PRN Fritzi Mandes, MD   1 tablet at 01/22/17 0717  . traMADol (ULTRAM) tablet 50 mg  50 mg Oral Q6H PRN Fritzi Mandes, MD          .  PHYSICAL EXAMINATION:  Vitals:   01/22/17 1242 01/22/17 1426  BP: (!) 106/52 108/66  Pulse: 96 (!) 120  Resp: 20 18  Temp: 98.8 F (37.1 C) 98.6 F (37 C)   Filed Weights   01/21/17 1211 01/22/17 0304  Weight: 136 lb (61.7 kg) 121 lb 14.4 oz (55.3 kg)    GENERAL:Thin  built moderately nourished; Alert, no distress and comfortable.   Accompanied by her husband.  EYES: no pallor or icterus OROPHARYNX: no thrush or ulceration. NECK: supple, no masses felt LYMPH:  no palpable lymphadenopathy in the cervical, axillary or inguinal regions LUNGS: decreased breath sounds to auscultation at bases and  No wheeze or crackles HEART/CVS: regular rate & rhythm and no murmurs; No lower extremity edema ABDOMEN: abdomen soft, non-tender and normal bowel sounds; positive for colostomy.  Musculoskeletal:no cyanosis of digits and no clubbing  PSYCH: alert & oriented x 3 with fluent speech NEURO: no focal motor/sensory deficits SKIN:  no rashes or significant lesions  LABORATORY DATA:  I have reviewed the data as listed Lab Results  Component Value Date   WBC 20.6 (H) 01/22/2017   HGB 10.0 (L) 01/22/2017   HCT  30.8 (L) 01/22/2017   MCV 90.0 01/22/2017   PLT 746 (H) 01/22/2017    Recent Labs  12/21/16 0151  12/31/16 0627  01/08/17 0538 01/21/17 1223 01/22/17 1523  NA 137  < > 130*  < > 137 129* 130*  K 4.4  < > 3.2*  < > 3.5 2.6* 4.0  CL 100*  < > 93*  < > 102 87* 94*  CO2 30  < > 27  < > 29 32 29  GLUCOSE 93  < > 102*  < > 107* 112* 244*  BUN 7  < > 16  < > _0 CREATININE 0.65  < > 0.62  < > 0.57 0.46 0.58  CALCIUM 8.7*  < > 7.3*  < > 7.8* 8.3* 8.1*  GFRNONAA >60  < > >60  < > >  60 >60 >60  GFRAA >60  < > >60  < > >60 >60 >60  PROT 6.3*  --  4.5*  --   --  6.6  --   ALBUMIN 2.9*  --  1.7*  --   --  2.9*  --   AST 26  --  43*  --   --  27  --   ALT 21  --  30  --   --  12*  --   ALKPHOS 72  --  100  --   --  56  --   BILITOT 0.5  --  3.0*  --   --  0.6  --   < > = values in this interval not displayed.  RADIOGRAPHIC STUDIES: I have personally reviewed the radiological images as listed and agreed with the findings in the report. Ct Angio Chest Pe W Or Wo Contrast  Result Date: 12/30/2016 CLINICAL DATA:  Acute onset of generalized weakness and lethargy. Shortness of breath, generalized abdominal pain and diarrhea. Vomiting. Status post recent bilateral salpingo-oophorectomy. Initial encounter. EXAM: CT ANGIOGRAPHY CHEST CT ABDOMEN AND PELVIS WITH CONTRAST TECHNIQUE: Multidetector CT imaging of the chest was performed using the standard protocol during bolus administration of intravenous contrast. Multiplanar CT image reconstructions and MIPs were obtained to evaluate the vascular anatomy. Multidetector CT imaging of the abdomen and pelvis was performed using the standard protocol during bolus administration of intravenous contrast. CONTRAST:  75 mL of Isovue 370 IV contrast COMPARISON:  None. FINDINGS: CTA CHEST FINDINGS Cardiovascular:  There is no evidence of pulmonary embolus. The heart remains normal in size. The thoracic aorta is unremarkable in appearance. No calcific atherosclerotic  disease is seen. The great vessels are unremarkable in appearance. Mediastinum/Nodes: No mediastinal lymphadenopathy is seen. No pericardial effusion is identified. The thyroid gland is unremarkable. No axillary lymphadenopathy is appreciated. Lungs/Pleura: Scattered small nodules of varying size are noted throughout the lungs, measuring up to 1.1 cm in size and suspicious for metastatic disease. Mild bibasilar atelectasis is noted. No pleural effusion or pneumothorax is seen. Musculoskeletal: No acute osseous abnormalities are identified. The visualized musculature is unremarkable in appearance. The breasts are unremarkable in appearance, though the lateral left breast is incompletely characterized. Review of the MIP images confirms the above findings. CT ABDOMEN and PELVIS FINDINGS Hepatobiliary: A vague 1.4 cm hypodensity at the left hepatic lobe raises suspicion for metastatic disease. The liver is otherwise unremarkable. The gallbladder is grossly unremarkable in appearance. The common bile duct remains normal in caliber. Pancreas: The pancreas is grossly unremarkable in appearance. No definite pancreatic mass is identified. Spleen: The spleen is unremarkable in appearance. Adrenals/Urinary Tract: The adrenal glands are grossly unremarkable. The kidneys are within normal limits. There is no evidence of hydronephrosis. No renal or ureteral stones are identified. No perinephric stranding is seen. Stomach/Bowel: There appears to be perforation at the mid sigmoid colon, with a large contiguous collection of free fluid and air tracking across the right side of the abdomen and left hemipelvis, measuring 20-30 cm in size. Underlying diffuse mesenteric edema is noted, and scattered additional free air is seen tracking throughout the abdomen and pelvis. Findings are compatible with evolving abscess throughout the abdomen and pelvis. There is diffuse distention of small-bowel loops, concerning for underlying ileus. The  stomach is grossly unremarkable in appearance. There is diffuse wall thickening along the ascending and proximal sigmoid colon, likely reflecting underlying infection. Vascular/Lymphatic: The abdominal aorta is unremarkable in appearance. The inferior  vena cava is grossly unremarkable. Retroperitoneal nodes measure up to 1.2 cm in short axis. Diffuse retroperitoneal edema is seen. No definite pelvic sidewall lymphadenopathy is appreciated. Reproductive: Irregular enhancing nodules are seen arising at the uterus. These may reflect uterine fibroids, though metastatic disease cannot be excluded. The bladder is mildly distended and grossly unremarkable. Other: No additional soft tissue abnormalities are seen. Musculoskeletal: No acute osseous abnormalities are identified. The visualized musculature is unremarkable in appearance. Review of the MIP images confirms the above findings. IMPRESSION: 1. Perforation at the mid sigmoid colon, with a large contiguous collection of free fluid and air tracking across the right side of the abdomen and left hemipelvis, measuring 20-30 cm in size. Underlying diffuse mesenteric edema, with scattered additional free air throughout the abdomen and pelvis. Findings compatible with evolving abscess throughout the abdomen and pelvis. 2. Diffuse wall thickening along the ascending and proximal sigmoid colon, likely reflecting underlying infection. 3. Diffuse distention of small-bowel loops likely reflects underlying ileus. 4. No evidence of pulmonary embolus. 5. Scattered small nodules of varying size throughout the lungs, measuring up to 1.1 cm in size. These are suspicious for metastatic disease, given the patient's known metastatic adenocarcinoma. 6. Vague 1.4 cm hypodensity at the left hepatic lobe raises suspicion for metastatic disease. 7. Retroperitoneal nodes measure up to 1.2 cm in short axis. This is nonspecific, though metastatic disease cannot be excluded. Underlying  retroperitoneal edema noted. 8. Irregular enhancing nodules arising at the uterus. These may reflect uterine fibroids, though metastatic disease cannot be excluded. 9. No definite breast or pancreatic mass is seen. Given recent surgical pathology results, the primary source of the patient's adenocarcinoma is unclear. Critical Value/emergent results were called by telephone at the time of interpretation on 12/30/2016 at 10:51 pm to Dr. Carrie Mew, who verbally acknowledged these results. Electronically Signed   By: Garald Balding M.D.   On: 12/30/2016 23:05   Ct Abdomen Pelvis W Contrast  Result Date: 01/21/2017 CLINICAL DATA:  Tachycardia and leukocytosis with history of intra-abdominal abscess after rupture of bowel. Positive for cancer although etiology is unknown. EXAM: CT ABDOMEN AND PELVIS WITH CONTRAST TECHNIQUE: Multidetector CT imaging of the abdomen and pelvis was performed using the standard protocol following bolus administration of intravenous contrast. CONTRAST:  171m ISOVUE-300 IOPAMIDOL (ISOVUE-300) INJECTION 61% COMPARISON:  01/08/2017 FINDINGS: Lower chest: Interval increase in size and number of pulmonary nodular opacities within the visualized lung bases, example series 4, image 11 in the right middle lobe now measuring 11 mm versus 5 mm on prior exam. No effusion or pneumothorax. Normal visualized cardiac chamber size. No pericardial effusion. Hepatobiliary: Interval increase in size and number of peripherally enhancing hepatic lesions, new adjacent to the gallbladder fossa on series 2, image 30 measuring 10 mm and slightly larger dominant lesion in the left hepatic lobe estimated at 17 x 13 mm versus approximately 14 mm previously. Gallbladder is physiologically distended without calculi. Minimal intrahepatic ductal dilatation in the left hepatic lobe adjacent to the dominant presumed metastatic lesion. Adjacent to the right hepatic lobe along its caudal aspect is a 16 mm circumscribed  hypodensity, smaller in appearance than on prior which had measured approximately 21 mm in diameter. Small abscess or necrotic metastasis might account for this appearance, series 2, image 40. Pancreas: Unremarkable. No pancreatic ductal dilatation or surrounding inflammatory changes. Spleen: Normal in size without focal abnormality. Adrenals/Urinary Tract: Normal bilateral adrenal glands and right kidney. New hypodensity in the interpolar aspect of the left kidney measuring  14 x 12 mm. Differential possibilities may include a renal neoplasm, focus of infection, metastasis or possibly infarct, series 2, image 32. No nephrolithiasis nor obstructive uropathy. The urinary bladder is physiologically distended. Stomach/Bowel: Physiologic distention of the stomach. Normal small bowel rotation. Left lower quadrant colostomy. Status post partial resection sigmoid. Vascular/Lymphatic: Small mesenteric nodules are noted in the mid pelvis measuring up to 7 mm short axis and may represent reactive or metastatic adenopathy. The aorta and iliac vessels are unremarkable. Reproductive: Heterogeneous enhancement of uterus consistent with fibroids. Reportly, both ovaries have been removed. Other: Inflammatory/phlegmonous change about the uterus at site of prior pelvic abscesses. These collections are more thick-walled in appearance and minimally larger on the right measuring 30 x 14 mm versus 30 x 10 mm previously. 11 x 5 mm left-sided thick-walled abscess versus 24 x 15 mm previously. Percutaneous pigtail drainage catheter from right lower quadrant approach is noted coiled in the right hemipelvis anteriorly as before. Musculoskeletal: No acute or significant osseous findings. IMPRESSION: 1. Interval increase in size and number of pulmonary and hepatic lesions consistent with worsening metastatic disease. 2. New ill-defined 14 x 12 mm interpolar hypodensity associated with the left kidney which may reflect a focus of infection,  infarct, or neoplasm/metastasis. 3. Phlegmonous change in the pelvis adjacent to the uterus with slightly thicker walled presumed abscess collections minimally larger on the right and smaller on the left. A pigtail percutaneous drainage catheter is seen just anterior to the larger right-sided collection. 4. Smaller circumscribed subhepatic 16 mm hypodensity versus 21 mm previously potentially representing a small abscess or necrotic metastasis. 5. Small subcentimeter mesenteric lymph nodes are noted as above described measuring up to 7 mm. Reactive versus metastatic. Electronically Signed   By: Ashley Royalty M.D.   On: 01/21/2017 15:37   Ct Abdomen Pelvis W Contrast  Result Date: 01/08/2017 CLINICAL DATA:  Diverticular abscess. EXAM: CT ABDOMEN AND PELVIS WITH CONTRAST TECHNIQUE: Multidetector CT imaging of the abdomen and pelvis was performed using the standard protocol following bolus administration of intravenous contrast. CONTRAST:  60m ISOVUE-300 IOPAMIDOL (ISOVUE-300) INJECTION 61% COMPARISON:  CT scan of Jan 05, 2017. FINDINGS: Lower chest: Bibasilar pulmonary nodules are noted concerning for metastatic disease. Mild bibasilar subsegmental atelectasis is noted as well. Hepatobiliary: Probable sludge is noted within the gallbladder. Stable 1.4 cm low density with irregular margins is noted in the left hepatic lobe which is not changed compared to prior exam. This is concerning for possible metastatic focus. Pancreas: Unremarkable. No pancreatic ductal dilatation or surrounding inflammatory changes. Spleen: Normal in size without focal abnormality. Adrenals/Urinary Tract: Adrenal glands are unremarkable. Kidneys are normal, without renal calculi, focal lesion, or hydronephrosis. Bladder is unremarkable. Stomach/Bowel: Visualized esophagus and stomach appear normal. Mildly dilated proximal small bowel loops are noted concerning for ileus. 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 Status post partial  resection of sigmoid colon. Vascular/Lymphatic: No significant vascular findings are present. No enlarged abdominal or pelvic lymph nodes. Reproductive: Multiple small uterine fibroids are noted. Reportedly both ovaries have been removed. Other: There is been interval placement of percutaneous drain into pelvic abscess described on prior exam. The abscess is significantly decreased in size, although it measures 4.3 x 1.8 cm superiorly with 2.9 x 1.0 cm fluid collections seen to the right of the uterus and 2.4 x 1.5 cm fluid collection seen to the left of the uterus. Stable position of surgical drain is seen entering the right anterior abdominal wall, with distal tip in the right  lower quadrant. Musculoskeletal: No acute or significant osseous findings. IMPRESSION: Bibasilar pulmonary nodules are again noted concerning for metastatic disease. Probable sludge seen within the gallbladder. Stable 1.4 cm low density noted in left hepatic lobe concerning for possible metastatic disease. 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 Interval placement of percutaneous drain into the pelvic abscess described on prior exam. This abscess is significantly smaller compared to prior exam. Stable position of surgical drain seen involving right side of abdomen. Electronically Signed   By: Marijo Conception, M.D.   On: 01/08/2017 09:08   Ct Abdomen Pelvis W Contrast  Result Date: 01/05/2017 CLINICAL DATA:  History of adenocarcinoma. Recent perforation of the sigmoid colon with subsequent surgery. Leukocytosis. EXAM: CT ABDOMEN AND PELVIS WITH CONTRAST TECHNIQUE: Multidetector CT imaging of the abdomen and pelvis was performed using the standard protocol following bolus administration of intravenous contrast. CONTRAST:  139m ISOVUE-300 IOPAMIDOL (ISOVUE-300) INJECTION 61%, <See Chart> ISOVUE-300 IOPAMIDOL (ISOVUE-300) INJECTION 61% COMPARISON:  Dec 30, 2016 FINDINGS: Lower chest: Pulmonary nodularity again identified, suspicious for  metastatic disease given history. There is a small left pleural effusion. Bibasilar opacities are likely atelectasis. No other acute abnormalities in the lung bases. Hepatobiliary: A low-attenuation lesion in the left hepatic lobe measures 14 mm, unchanged. A cyst is seen along the inferior right hepatic lobe. No other definitive solid masses are seen within the liver. The portal vein is patent. The gallbladder is unremarkable. No other abnormalities in the liver. Pancreas: Unremarkable. No pancreatic ductal dilatation or surrounding inflammatory changes. Spleen: Normal in size without focal abnormality. Adrenals/Urinary Tract: The adrenal glands, kidneys, and ureters are normal. Air in the bladder is likely from recent catheterization. Stomach/Bowel: The distal esophagus and stomach are normal. Small bowel loops are mildly prominent but not grossly dilated. Contrast does not reach the terminal ileum but does traverse the small bowel into the region of the mid ileum. The patient is status post partial colonic resection. There is gas in the colon to the level of the left lower quadrant ostomy. Vascular/Lymphatic: The abdominal aorta is normal in caliber. Shotty nodes in the retroperitoneum are again identified. The previously identified 12 mm node to the left of the aorta on image 35 is unchanged. No new adenopathy identified. Reproductive: Fibroids in the uterus. By report, the ovaries have been removed. Other: There is free fluid within the abdomen and pelvis which could be postoperative. There is an organized collection of fluid as seen on axial image 73. There is peripheral enhancement as well some air within this collection. The main body of this collection measures 10.4 by 5.5 by 4.7 cm. A smaller collection of fluid is seen in the bowel mesenteries on series 2, image 53 measuring 4.7 by 2.1 cm. This is not nearly as organized and could simply be postoperative. No other definitive organized collections are  seen. There is free air scattered throughout the abdomen and pelvis consistent with the recent postoperative status. Musculoskeletal: No acute changes. IMPRESSION: 1. There is an organizing collection of fluid measuring 10 x 6 x 5 cm in the pelvis. There is peripheral enhancement around this collection as well some air in the collection. Given history, the findings are suspicious for developing abscess. A smaller collection in the bowel mesentary is less specific and could simply be postoperative. 2. Pulmonary nodule suspicious for metastatic disease. 3. Low-attenuation lesion in the left hepatic lobe is nonspecific. However, given history, a metastatic lesion could have this appearance. 4. Probable mild ileus.  No  definite obstruction. 5. Shotty nodes in the retroperitoneum could be reactive. However, given history, it would be difficult to completely exclude metastatic disease. These results will be called to the ordering clinician or representative by the Radiologist Assistant, and communication documented in the PACS or zVision Dashboard. Electronically Signed   By: Dorise Bullion III M.D   On: 01/05/2017 12:26   Ct Abdomen Pelvis W Contrast  Result Date: 12/30/2016 CLINICAL DATA:  Acute onset of generalized weakness and lethargy. Shortness of breath, generalized abdominal pain and diarrhea. Vomiting. Status post recent bilateral salpingo-oophorectomy. Initial encounter. EXAM: CT ANGIOGRAPHY CHEST CT ABDOMEN AND PELVIS WITH CONTRAST TECHNIQUE: Multidetector CT imaging of the chest was performed using the standard protocol during bolus administration of intravenous contrast. Multiplanar CT image reconstructions and MIPs were obtained to evaluate the vascular anatomy. Multidetector CT imaging of the abdomen and pelvis was performed using the standard protocol during bolus administration of intravenous contrast. CONTRAST:  75 mL of Isovue 370 IV contrast COMPARISON:  None. FINDINGS: CTA CHEST FINDINGS  Cardiovascular:  There is no evidence of pulmonary embolus. The heart remains normal in size. The thoracic aorta is unremarkable in appearance. No calcific atherosclerotic disease is seen. The great vessels are unremarkable in appearance. Mediastinum/Nodes: No mediastinal lymphadenopathy is seen. No pericardial effusion is identified. The thyroid gland is unremarkable. No axillary lymphadenopathy is appreciated. Lungs/Pleura: Scattered small nodules of varying size are noted throughout the lungs, measuring up to 1.1 cm in size and suspicious for metastatic disease. Mild bibasilar atelectasis is noted. No pleural effusion or pneumothorax is seen. Musculoskeletal: No acute osseous abnormalities are identified. The visualized musculature is unremarkable in appearance. The breasts are unremarkable in appearance, though the lateral left breast is incompletely characterized. Review of the MIP images confirms the above findings. CT ABDOMEN and PELVIS FINDINGS Hepatobiliary: A vague 1.4 cm hypodensity at the left hepatic lobe raises suspicion for metastatic disease. The liver is otherwise unremarkable. The gallbladder is grossly unremarkable in appearance. The common bile duct remains normal in caliber. Pancreas: The pancreas is grossly unremarkable in appearance. No definite pancreatic mass is identified. Spleen: The spleen is unremarkable in appearance. Adrenals/Urinary Tract: The adrenal glands are grossly unremarkable. The kidneys are within normal limits. There is no evidence of hydronephrosis. No renal or ureteral stones are identified. No perinephric stranding is seen. Stomach/Bowel: There appears to be perforation at the mid sigmoid colon, with a large contiguous collection of free fluid and air tracking across the right side of the abdomen and left hemipelvis, measuring 20-30 cm in size. Underlying diffuse mesenteric edema is noted, and scattered additional free air is seen tracking throughout the abdomen and  pelvis. Findings are compatible with evolving abscess throughout the abdomen and pelvis. There is diffuse distention of small-bowel loops, concerning for underlying ileus. The stomach is grossly unremarkable in appearance. There is diffuse wall thickening along the ascending and proximal sigmoid colon, likely reflecting underlying infection. Vascular/Lymphatic: The abdominal aorta is unremarkable in appearance. The inferior vena cava is grossly unremarkable. Retroperitoneal nodes measure up to 1.2 cm in short axis. Diffuse retroperitoneal edema is seen. No definite pelvic sidewall lymphadenopathy is appreciated. Reproductive: Irregular enhancing nodules are seen arising at the uterus. These may reflect uterine fibroids, though metastatic disease cannot be excluded. The bladder is mildly distended and grossly unremarkable. Other: No additional soft tissue abnormalities are seen. Musculoskeletal: No acute osseous abnormalities are identified. The visualized musculature is unremarkable in appearance. Review of the MIP images confirms the above findings.  IMPRESSION: 1. Perforation at the mid sigmoid colon, with a large contiguous collection of free fluid and air tracking across the right side of the abdomen and left hemipelvis, measuring 20-30 cm in size. Underlying diffuse mesenteric edema, with scattered additional free air throughout the abdomen and pelvis. Findings compatible with evolving abscess throughout the abdomen and pelvis. 2. Diffuse wall thickening along the ascending and proximal sigmoid colon, likely reflecting underlying infection. 3. Diffuse distention of small-bowel loops likely reflects underlying ileus. 4. No evidence of pulmonary embolus. 5. Scattered small nodules of varying size throughout the lungs, measuring up to 1.1 cm in size. These are suspicious for metastatic disease, given the patient's known metastatic adenocarcinoma. 6. Vague 1.4 cm hypodensity at the left hepatic lobe raises suspicion  for metastatic disease. 7. Retroperitoneal nodes measure up to 1.2 cm in short axis. This is nonspecific, though metastatic disease cannot be excluded. Underlying retroperitoneal edema noted. 8. Irregular enhancing nodules arising at the uterus. These may reflect uterine fibroids, though metastatic disease cannot be excluded. 9. No definite breast or pancreatic mass is seen. Given recent surgical pathology results, the primary source of the patient's adenocarcinoma is unclear. Critical Value/emergent results were called by telephone at the time of interpretation on 12/30/2016 at 10:51 pm to Dr. Carrie Mew, who verbally acknowledged these results. Electronically Signed   By: Garald Balding M.D.   On: 12/30/2016 23:05   Ct Image Guided Drainage By Percutaneous Catheter  Result Date: 01/05/2017 INDICATION: 51 year old female with a peripherally enhancing fluid collection and increasing leukocytosis status post sigmoid colon resection, left lower quadrant colostomy creation and Hartmann's pouch procedure for perforated sigmoid colon. Imaging findings are concerning for postoperative abscess. EXAM: CT GUIDED DRAINAGE OF right lower quadrant intraperitoneal abdominal ABSCESS MEDICATIONS: The patient is currently admitted to the hospital and receiving intravenous antibiotics. The antibiotics were administered within an appropriate time frame prior to the initiation of the procedure. ANESTHESIA/SEDATION: 2 mg IV Versed 100 mcg IV Fentanyl Moderate Sedation Time:  20 minutes The patient was continuously monitored during the procedure by the interventional radiology nurse under my direct supervision. COMPLICATIONS: None immediate. TECHNIQUE: Informed written consent was obtained from the patient after a thorough discussion of the procedural risks, benefits and alternatives. All questions were addressed. Maximal Sterile Barrier Technique was utilized including caps, mask, sterile gowns, sterile gloves, sterile drape,  hand hygiene and skin antiseptic. A timeout was performed prior to the initiation of the procedure. PROCEDURE: The operative field was prepped with Chlorhexidine in a sterile fashion, and a sterile drape was applied covering the operative field. A sterile gown and sterile gloves were used for the procedure. Local anesthesia was provided with 1% Lidocaine. A small dermatotomy was made. Using intermittent CT guidance, an 18 gauge trocar needle was advanced through the right lower quadrant abdominal wall taking care to avoid the inferior epigastric vessels. The needle was advanced into the fluid collection. An Amplatz wire was then advanced the needle and coiled within the fluid collection. The needle was removed. The skin tract was dilated to 75 [place] and a Cook 12 [place] all-purpose drainage catheter was advanced over the wire and formed in the fluid collection. Aspiration yields approximately 40 mL of turbid serosanguineous fluid containing internal debris. A sample was sent to the laboratory for culture. Post drain placement axial CT imaging demonstrates excellent position of the drainage catheter and near complete resolution of the fluid collection. The catheter was secured to the skin with 0 Prolene suture and a  sterile bandage. The patient tolerated the procedure well. FINDINGS: Approximately 40 mL turbid serosanguineous fluid with internal debris. IMPRESSION: Technically successful placement of a 61 French percutaneous drain into the right lower quadrant postoperative fluid collection with aspiration as described above. Maintain tube to JP bulb suction. When drain output is minimal, recommend repeat CT scan of the pelvis with intravenous contrast prior to drain removal. Electronically Signed   By: Jacqulynn Cadet M.D.   On: 01/05/2017 16:00    ASSESSMENT & PLAN:   # 51 year old female patient with carcinoma of unknown primary; and recent pelvic abscess- is currently admitted to the hospital for  fever/sepsis.  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 PDL-1 50%. We will request a second opinion on the pathology at [facility]. Discussed with the patient family the incurable nature of the disease. Would recommend palliative treatments. Active infection [C discussion below]- would put patient at high risk for infectious complications from chemotherapy-like carbotaxol. Given the active infection issues [PDL 1-50%]/ the fact that patient needs to be treated as soon as possible- I would recommend immunotherapy- with Keytruda. Discussed with Dr.Secord; [facility].  # Pelvic abscess status post- IR guided drain. Patient currently on meropenem [since having a reaction to cefepime]. Discussed with ID; Dr. Ola Spurr. Patient will likely need IV antibiotics/PICC line for about 3-4 weeks based upon clinical improvement. Need to monitor for infectious complications closely.   # Discussed the patient and husband in detail. We will likely plan to start immunotherapy next week. Also discussed- with patient's primary care doctor Dr.McClean.   All questions were answered. The patient knows to call the clinic with any problems, questions or concerns.    Cammie Sickle, MD 01/22/2017 6:37 PM

## 2017-01-22 NOTE — Progress Notes (Signed)
Patient complained of generalized rash on body, patient states she did not have rash before hospital visit, MD notified, benadryl PO ordered, rash  maybe related to Cefepime IV Antibiotic, d/c'ed per MD order. Cefepime documented as patients allergies, continued to monitor.

## 2017-01-23 ENCOUNTER — Inpatient Hospital Stay: Payer: Managed Care, Other (non HMO)

## 2017-01-23 ENCOUNTER — Encounter: Payer: Self-pay | Admitting: Radiology

## 2017-01-23 ENCOUNTER — Telehealth: Payer: Self-pay | Admitting: Internal Medicine

## 2017-01-23 DIAGNOSIS — R000 Tachycardia, unspecified: Secondary | ICD-10-CM

## 2017-01-23 DIAGNOSIS — K651 Peritoneal abscess: Secondary | ICD-10-CM

## 2017-01-23 DIAGNOSIS — L270 Generalized skin eruption due to drugs and medicaments taken internally: Secondary | ICD-10-CM

## 2017-01-23 DIAGNOSIS — E871 Hypo-osmolality and hyponatremia: Secondary | ICD-10-CM

## 2017-01-23 DIAGNOSIS — D72829 Elevated white blood cell count, unspecified: Secondary | ICD-10-CM

## 2017-01-23 DIAGNOSIS — C799 Secondary malignant neoplasm of unspecified site: Secondary | ICD-10-CM

## 2017-01-23 DIAGNOSIS — E876 Hypokalemia: Secondary | ICD-10-CM

## 2017-01-23 LAB — BASIC METABOLIC PANEL
ANION GAP: 6 (ref 5–15)
BUN: 6 mg/dL (ref 6–20)
CALCIUM: 7.4 mg/dL — AB (ref 8.9–10.3)
CO2: 28 mmol/L (ref 22–32)
CREATININE: 0.48 mg/dL (ref 0.44–1.00)
Chloride: 101 mmol/L (ref 101–111)
GFR calc Af Amer: >60 mL/min (ref >60)
GLUCOSE: 104 mg/dL — AB (ref 65–99)
Potassium: 3.6 mmol/L (ref 3.5–5.1)
Sodium: 135 mmol/L (ref 135–145)

## 2017-01-23 LAB — CBC
HCT: 26.6 % — ABNORMAL LOW (ref 35.0–47.0)
Hemoglobin: 8.7 g/dL — ABNORMAL LOW (ref 12.0–16.0)
MCH: 30.2 pg (ref 26.0–34.0)
MCHC: 32.7 g/dL (ref 32.0–36.0)
MCV: 92.4 fL (ref 80.0–100.0)
PLATELETS: 706 10*3/uL — AB (ref 150–440)
RBC: 2.88 MIL/uL — ABNORMAL LOW (ref 3.80–5.20)
RDW: 16.9 % — AB (ref 11.5–14.5)
WBC: 20.5 10*3/uL — AB (ref 3.6–11.0)

## 2017-01-23 LAB — C-REACTIVE PROTEIN: CRP: 8 mg/dL — AB (ref <1.0)

## 2017-01-23 LAB — PROTIME-INR
INR: 1.04
Prothrombin Time: 13.6 seconds (ref 11.4–15.2)

## 2017-01-23 LAB — MAGNESIUM: Magnesium: 2.5 mg/dL — ABNORMAL HIGH (ref 1.7–2.4)

## 2017-01-23 LAB — APTT: aPTT: 29 seconds (ref 24–36)

## 2017-01-23 LAB — HEPARIN LEVEL (UNFRACTIONATED): HEPARIN UNFRACTIONATED: 0.2 [IU]/mL — AB (ref 0.30–0.70)

## 2017-01-23 MED ORDER — SODIUM CHLORIDE 0.9% FLUSH: 10.0000 mL | INTRAVENOUS | Status: DC | PRN

## 2017-01-23 MED ORDER — HEPARIN BOLUS VIA INFUSION
800.0000 [IU] | Freq: Once | INTRAVENOUS | Status: AC
  Administered 2017-01-24: 800 [IU] via INTRAVENOUS
  Filled 2017-01-23: qty 800

## 2017-01-23 MED ORDER — HEPARIN (PORCINE) IN NACL 100-0.45 UNIT/ML-% IJ SOLN
1000.0000 [IU]/h | INTRAMUSCULAR | Status: AC
  Administered 2017-01-23: 16:00:00 900 [IU]/h via INTRAVENOUS
  Filled 2017-01-23: qty 250

## 2017-01-23 MED ORDER — SODIUM CHLORIDE 0.9 % IV SOLN: 1.0000 g | Freq: Three times a day (TID) | INTRAVENOUS | 1 refills | Status: DC

## 2017-01-23 MED ORDER — TECHNETIUM TC 99M DIETHYLENETRIAME-PENTAACETIC ACID
34.2300 | Freq: Once | INTRAVENOUS | Status: AC | PRN
  Administered 2017-01-23: 34.23 via RESPIRATORY_TRACT

## 2017-01-23 MED ORDER — IOPAMIDOL (ISOVUE-370) INJECTION 76%
75.0000 mL | Freq: Once | INTRAVENOUS | Status: AC | PRN
  Administered 2017-01-23: 75 mL via INTRAVENOUS

## 2017-01-23 MED ORDER — DIPHENHYDRAMINE HCL 25 MG PO CAPS: 25.0000 mg | ORAL_CAPSULE | Freq: Four times a day (QID) | ORAL | 0 refills | Status: DC | PRN

## 2017-01-23 MED ORDER — TECHNETIUM TO 99M ALBUMIN AGGREGATED
4.0000 | Freq: Once | INTRAVENOUS | Status: AC | PRN
  Administered 2017-01-23: 4.38 via INTRAVENOUS

## 2017-01-23 MED ORDER — HEPARIN BOLUS VIA INFUSION
3500.0000 [IU] | Freq: Once | INTRAVENOUS | Status: AC
  Administered 2017-01-23: 3500 [IU] via INTRAVENOUS
  Filled 2017-01-23: qty 3500

## 2017-01-23 MED ORDER — SODIUM CHLORIDE 0.9% FLUSH
10.0000 mL | Freq: Two times a day (BID) | INTRAVENOUS | Status: DC
  Administered 2017-01-23: 20 mL
  Administered 2017-01-24: 10 mL

## 2017-01-23 NOTE — Progress Notes (Signed)
[place] [facility] [place] NAME: Anna [name]    MR#:  [id number]  DATE OF BIRTH:  Jun 05, 1966  SUBJECTIVE:  CHIEF COMPLAINT:   Chief Complaint  Patient presents with  . Fever  . 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 She was discharged on Cipro and Flagyl, given her multiple pelvic abscesses which were drained during the last admission. Now she was readmitted due to elevated white blood cell count and fever. She was initiated on cefepime and Flagyl, however, developed a rash due to cefepime, which was stopped. Repeat his CT scan of abdomen and pelvis revealed pelvic phlegmon, worsening metastatic disease. Patient denies any abdominal pain, Rash is Improving, now on meropenem intravenously. Dr. Ola Spurr recommends. PICC line placement and antibiotic therapy for the next several weeks with follow-up with him in 1-2 weeks. PICC line is placed today. Patient's heart rate remains high at around 120 intermittently despite normal metabolism. She denies shortness of breath, cough, chest pains. CT scan of chest with IV contrast reveals PE. She is initiated on heparin intravenously  Review of Systems  Constitutional: Negative for chills, fever and weight loss.  HENT: Negative for congestion.   Eyes: Negative for blurred vision and double vision.  Respiratory: Negative for cough, sputum production, shortness of breath and wheezing.   Cardiovascular: Negative for chest pain, palpitations, orthopnea, leg swelling and PND.  Gastrointestinal: Negative for abdominal pain, blood in stool, constipation, diarrhea, nausea and vomiting.  Genitourinary: Negative for dysuria, frequency, hematuria and urgency.  Musculoskeletal: Negative for  falls.  Skin: Positive for itching and rash.  Neurological: Negative for dizziness, tremors, focal weakness and headaches.  Endo/Heme/Allergies: Does not bruise/bleed easily.  Psychiatric/Behavioral: Negative for depression. The patient does not have insomnia.     VITAL SIGNS: Blood pressure 125/74, pulse (!) 111, temperature 98.8 F (37.1 C), temperature source Oral, resp. rate 18, height 5\' 3"  (1.6 m), weight 55.3 kg (121 lb 14.4 oz), last menstrual period 03/01/2015, SpO2 100 %.  PHYSICAL EXAMINATION:   GENERAL:  51 y.o.-year-old patient lying in the bed with no acute distress.  EYES: Pupils equal, round, reactive to light and accommodation. No scleral icterus. Extraocular muscles intact.  HEENT: Head atraumatic, normocephalic. Oropharynx and nasopharynx clear.  NECK:  Supple, no jugular venous distention. No thyroid enlargement, no tenderness.  LUNGS: Normal breath sounds bilaterally, no wheezing, rales,rhonchi or crepitation. No use of accessory muscles of respiration.  CARDIOVASCULAR: S1, S2 , tachycardic. No murmurs, rubs, or gallops.  ABDOMEN: Soft, nontender, nondistended. Bowel sounds present. No organomegaly or mass.  EXTREMITIES: No pedal edema, cyanosis, or clubbing.  NEUROLOGIC: Cranial nerves II through XII are intact. Muscle strength 5/5 in all extremities. Sensation intact. Gait not checked.  PSYCHIATRIC: The patient is alert and oriented x 3.  SKIN: Patchy erythematous rash all over the body, no other lesions, or ulcer.   ORDERS/RESULTS REVIEWED:   CBC  Recent Labs Lab 01/21/17 1223 01/22/17 0411 01/23/17 0706  WBC 24.8* 20.6* 20.5*  HGB 9.8* 10.0* 8.7*  HCT 29.9* 30.8* 26.6*  PLT 678* 746* 706*  MCV 90.4 90.0 92.4  MCH 29.6 29.2 30.2  MCHC 32.7 32.5 32.7  RDW 16.7* 16.9* 16.9*  LYMPHSABS 0.8*  --   --   MONOABS 1.1*  --   --  EOSABS 0.2  --   --   BASOSABS 0.1  --   --     ------------------------------------------------------------------------------------------------------------------  Chemistries   Recent Labs Lab 01/21/17 1223 01/22/17 1523 01/23/17 0706  NA 129* 130* 135  K 2.6* 4.0 3.6  CL 87* 94* 101  CO2 32 29 28  GLUCOSE 112* 244* 104*  BUN 14 8 6   CREATININE 0.46 0.58 0.48  CALCIUM 8.3* 8.1* 7.4*  MG  --   --  2.5*  AST 27  --   --   ALT 12*  --   --   ALKPHOS 56  --   --   BILITOT 0.6  --   --    ------------------------------------------------------------------------------------------------------------------ estimated creatinine clearance is 68 mL/min (by C-G formula based on SCr of 0.48 mg/dL). ------------------------------------------------------------------------------------------------------------------ No results for input(s): TSH, T4TOTAL, T3FREE, THYROIDAB in the last 72 hours.  Invalid input(s): FREET3  Cardiac Enzymes No results for input(s): CKMB, TROPONINI, MYOGLOBIN in the last 168 hours.  Invalid input(s): CK ------------------------------------------------------------------------------------------------------------------ Invalid input(s): POCBNP ---------------------------------------------------------------------------------------------------------------  RADIOLOGY: Dg Chest 1 View  Result Date: 01/23/2017 CLINICAL DATA:  Pre V/Q scan examination. EXAM: CHEST 1 VIEW COMPARISON:  CT scan chest of Dec 30, 2016 FINDINGS: The lungs are adequately inflated. There is subtle nodularity within both lungs with the largest nodule noted in the rib right upper lung measuring just under 2 cm in greatest dimension. There is no alveolar infiltrate. A trace of pleural fluid at the left lung base laterally is suspected. The heart is top-normal in size. The pulmonary vascularity is normal. There is soft tissue fullness in the right hilar region. The mediastinum is normal in width. IMPRESSION: Subtle pulmonary nodules bilaterally  worrisome for metastatic disease. No discrete pneumonia. Small left pleural effusion. Probable right hilar lymphadenopathy. Electronically Signed   By: David  Martinique M.D.   On: 01/23/2017 12:44   Ct Angio Chest Pe W Or Wo Contrast  Result Date: 01/23/2017 CLINICAL DATA:  51 year old with recent diagnosis of metastatic adenocarcinoma of unknown primary, presenting with tachycardia and sepsis. Patient had colonic perforation with a large intra-abdominal abscess on 12/30/2016 for which she underwent colon resection. Ventilation-perfusion lung scan earlier today indicated high probability of pulmonary embolism. One view chest x-ray obtained earlier today demonstrated bilateral lung nodules. EXAM: CT ANGIOGRAPHY CHEST WITH CONTRAST TECHNIQUE: Multidetector CT imaging of the chest was performed using the standard protocol during bolus administration of intravenous contrast. Multiplanar CT image reconstructions and MIPs were obtained to evaluate the vascular anatomy. CONTRAST:  75 mL Isovue 370 IV. COMPARISON:  CT chest abdomen and pelvis 12/30/2016. Chest X leg and nuclear medicine V/Q scan performed earlier today. FINDINGS: Cardiovascular: Contrast opacification of pulmonary arteries is very good. Filling defects within segmental branches of the right upper lobe and right middle lobe pulmonary arteries. Marked narrowing of the right middle lobe pulmonary artery due to surrounding nodal tissue. Central pulmonary arteries patent. No evidence of right heart strain. Heart size upper normal. No pericardial effusion. No visible coronary atherosclerosis. No visible atherosclerosis involving the thoracic or upper abdominal aorta or their visualized branches. Mediastinum/Nodes: Right hilar lymphadenopathy which surrounds and compresses the right middle lobe pulmonary artery. No pathologic mediastinal, left hilar or axillary lymphadenopathy. Normal appearing esophagus. Thyroid gland normal in appearance. Lungs/Pleura: Numerous  bilateral pulmonary nodules. Index right upper lobe nodule measures approximately 1.9 x 1.6 cm, mean 1.8 cm (series 7, image 29). Index lingular nodule adjacent to the major fissure measures approximately 1.7 x 2.2 cm, mean  2.0 cm (series 7, image 59). Peribronchial and possible lymphangitic spread of tumor in the right lung and left lower lobe. Small left pleural effusion. No right pleural effusion. Dense atelectasis dependently in the lower lobes. Upper Abdomen: Unremarkable for the early arterial phase of enhancement which accounts for the heterogeneous appearance of the spleen. Musculoskeletal: Regional skeleton intact without acute or significant osseous abnormality. Specifically, no evidence of osseous metastatic disease. Review of the MIP images confirms the above findings. IMPRESSION: 1. Acute pulmonary emboli involving segmental branches of the right upper lobe and right middle lobe pulmonary artery. There is no evidence of right heart strain. 2. Right hilar lymphadenopathy which surrounds and narrows the right middle lobe pulmonary artery. 3. Numerous bilateral pulmonary metastases. Index lesions in the right upper lobe and lingula are measured above. 4. Peribronchial and possible lymphangitic spread of tumor in the right lung and in the left lower lobe. 5. Small left pleural effusion. 6. Dependent dense atelectasis in the lower lobes. Electronically Signed   By: Evangeline Dakin M.D.   On: 01/23/2017 14:14   Nm Pulmonary Perf And Vent  Result Date: 01/23/2017 CLINICAL DATA:  Tachycardia and sepsis.  Recent surgeries. EXAM: NUCLEAR MEDICINE VENTILATION - PERFUSION LUNG SCAN TECHNIQUE: Ventilation images were obtained in multiple projections using inhaled aerosol Tc-48m DTPA. Perfusion images were obtained in multiple projections after intravenous injection of Tc-70m MAA. RADIOPHARMACEUTICALS:  34.2 MCi Technetium-68m DTPA aerosol inhalation and 4.4 mCi Technetium-36m MAA IV COMPARISON:  Chest  radiograph from today FINDINGS: Ventilation: No focal ventilation defect. Perfusion: There are 2 large segmental perfusion defects involving the right upper and lower lobe. Within the superior segment of left lower lobe there is a moderate size segmental perfusion defect. IMPRESSION: High probability for acute pulmonary embolus. Electronically Signed   By: Kerby Moors M.D.   On: 01/23/2017 12:22    EKG:  Orders placed or performed during the hospital encounter of 12/30/16  . EKG 12-Lead  . EKG 12-Lead  . ED EKG  . ED EKG  . EKG    ASSESSMENT AND PLAN:  Active Problems:   Sepsis ([place])   Intra-abdominal abscess ([facility])   Leukocytosis   Hypokalemia   Hyponatremia   Hypomagnesemia   Drug eruption   Sinus tachycardia  #1. Sepsis due to pelvic abscesses , Continue patient on Meropenem intravenously, status post PICC line placement, patient will need to be on antibiotic therapy for the next 2-4 weeks, follow-up with Dr. Ola Spurr as outpatient, blood cultures are negative so far, urine culture showed no growth. The patient was seen by a surgeon, who recommended antibiotic therapy, but felt that patient is not a surgical candidate. The patient was also evaluated and seen by GYN oncologist, who recommended chemotherapy, but only after infection subsides. LDH was 173, normal #2. Leukocytosis, improving with therapy #3. Hyponatremia, hypokalemia, resolved with IV fluids, intravenous supplementation, magnesium level was found to be normal after supplementation #4. Hypomagnesemia, supplemented intravenously #5. Drug eruption, continue patient on Benadryl,  off prednisone  #6 . Acute pulmonary embolism, initiate patient on heparin intravenously, change to Eliquis  tomorrow morning, get Doppler ultrasound of lower extremities to rule out DVT, follow clinically, get nocturnal oximetry study to qualify for nighttime oxygen therapy at home. Discussed with Dr. Rogue Bussing    Management plans discussed  with the patient, family and they are in agreement.   DRUG ALLERGIES:  Allergies  Allergen Reactions  . Cefepime Rash  . Penicillins Rash    As a child.  Has patient had a PCN reaction causing immediate rash, facial/tongue/throat swelling, SOB or lightheadedness with hypotension: Unknown Has patient had a PCN reaction causing severe rash involving mucus membranes or skin necrosis: Unknown Has patient had a PCN reaction that required hospitalization: No Has patient had a PCN reaction occurring within the last 10 years: No If all of the above answers are "NO", then may proceed with Cephalosporin use.     CODE STATUS:     Code Status Orders        Start     Ordered   01/21/17 2101  Full code  Continuous     01/21/17 2100    Code Status History    Date Active Date Inactive Code Status Order ID Comments User Context   12/31/2016  3:58 AM 01/10/2017  4:29 PM Full Code [id number]  Jules Husbands, MD Inpatient   12/30/2016 10:48 PM 12/31/2016  3:58 AM Full Code [id number]  Schermerhorn, Gwen Her, MD ED   12/20/2016 11:16 AM 12/21/2016  1:51 PM Full Code [id number]  Schermerhorn, Gwen Her, MD Inpatient      TOTAL TIME TAKING CARE OF THIS PATIENT: 40 minutes.    Theodoro Grist M.D on 01/23/2017 at 4:39 PM  Between 7am to 6pm - Pager - [phone]  After 6pm go to [email or web address] - password [email or web address] [facility]  [facility]  Office  [phone]  CC: Primary care physician; McLean-Scocuzza, Nino Glow, MD

## 2017-01-23 NOTE — Telephone Encounter (Signed)
Anna [name]- this patient has carcinoma of unknown primary; PDL 1-50%. She is currently in the hospital [107]- with pelvic abscess post colectomy. Also seen by Dr.Secord.  Likely be discharged on IV antibiotics/PICC line for 3-4 weeks meropenem. She is most likely to be discharged from the hospital the next one to 2 days.  She has rapidly progressive malignancy; not a candidate for chemotherapy because of active infection. She needs to be started on immunotherapy/Keytruda; the orders are placed. Patient and husband understand the palliative nature of the treatments.  I would very much appreciate if you could see her outpatient/next week to start therapy.   Heather- please coordinate with Dr.Corcoran re: appt/and infusion planning.

## 2017-01-23 NOTE — Progress Notes (Signed)
ANTICOAGULATION CONSULT NOTE - Initial Consult  Pharmacy Consult for Heparin  Indication: pulmonary embolus  Allergies  Allergen Reactions  . Cefepime Rash  . Penicillins Rash    As a child. Has patient had a PCN reaction causing immediate rash, facial/tongue/throat swelling, SOB or lightheadedness with hypotension: Unknown Has patient had a PCN reaction causing severe rash involving mucus membranes or skin necrosis: Unknown Has patient had a PCN reaction that required hospitalization: No Has patient had a PCN reaction occurring within the last 10 years: No If all of the above answers are "NO", then may proceed with Cephalosporin use.     Patient Measurements: Height: 5\' 3"  (160 cm) Weight: 121 lb 14.4 oz (55.3 kg) IBW/kg (Calculated) : 52.4  Vital Signs: Temp: 99.4 F (37.4 C) (05/31 2023) Temp Source: Oral (05/31 2023) BP: 113/59 (05/31 2023) Pulse Rate: 133 (05/31 2023)  Labs:  Recent Labs  01/21/17 1223 01/22/17 0411 01/22/17 1523 01/23/17 0706 01/23/17 1600 01/23/17 2237  HGB 9.8* 10.0*  --  8.7*  --   --   HCT 29.9* 30.8*  --  26.6*  --   --   PLT 678* 746*  --  706*  --   --   APTT  --   --   --   --  29  --   LABPROT 14.5  --   --   --  13.6  --   INR 1.12  --   --   --  1.04  --   HEPARINUNFRC  --   --   --   --   --  0.20*  CREATININE 0.46  --  0.58 0.48  --   --     Estimated Creatinine Clearance: 68 mL/min (by C-G formula based on SCr of 0.48 mg/dL).   Medical History: Past Medical History:  Diagnosis Date  . Cancer ([place])   . Cancer with unknown primary site [facility])   . Colon polyp   . Diverticulosis   . Fibroid uterus   . Heme positive stool 08/01/2014  . HH (hiatus hernia)   . Large bowel perforation ([place]) 12/31/2016  . 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 Pelvic pain in female 01/13/2017  . Perforation of sigmoid colon [facility])     Assessment: Pharmacy consulted for heparin dosing and monitoring for PE in 51 yo female  Goal of  Therapy:  Heparin level 0.3-0.7 units/ml Monitor platelets by anticoagulation protocol: Yes   Plan:  Baseline labs ordered. Patient did receive dose of enoxaparin last night at 2200.  Give 3500 units bolus x 1 Start heparin infusion at 900 units/hr Check anti-Xa level in 6 hours and daily while on heparin Continue to monitor H&H and platelets  Enoxaparin has been discontinued.   5/31 22:30 heparin level 0.20. 800 unit bolus and increase rate to 1000 units/hr. Recheck with CBC in 6 hours.  Eloise Harman, PharmD, BCPS Clinical Pharmacist 01/23/2017 11:57 PM

## 2017-01-23 NOTE — Progress Notes (Signed)
Infectious Disease Long Term IV Antibiotic Orders  Diagnosis:intrabdominal abscess  Culture results Eikenella, E coli, viridans strep  Allergies:  Allergies  Allergen Reactions  . Cefepime Rash  . Penicillins Rash    As a child. Has patient had a PCN reaction causing immediate rash, facial/tongue/throat swelling, SOB or lightheadedness with hypotension: Unknown Has patient had a PCN reaction causing severe rash involving mucus membranes or skin necrosis: Unknown Has patient had a PCN reaction that required hospitalization: No Has patient had a PCN reaction occurring within the last 10 years: No If all of the above answers are "NO", then may proceed with Cephalosporin use.     Discharge antibiotics Meropenem  1 gm  every  8 hours  PICC Care per protocol Labs weekly while on IV antibiotics      CBC w diff   Comprehensive met panel  Planned duration of antibiotics 2 weeks   Stop date June 14th  Follow up clinic date 1-2 weeks   FAX weekly labs to [phone]  Leonel Ramsay, MD

## 2017-01-23 NOTE — Progress Notes (Signed)
[facility] INFECTIOUS DISEASE PROGRESS NOTE Date of Admission:  01/21/2017     ID: Anna [name] is a 51 y.o. female with  intrabdominal abscess Active Problems:   Sepsis ([place])   Intra-abdominal abscess ([facility])   Leukocytosis   Hypokalemia   Hyponatremia   Hypomagnesemia   Drug eruption   Sinus tachycardia   Subjective: No fevers, no abd pain, some redness on legs but rash seems fading per her report.   ROS  Eleven systems are reviewed and negative except per hpi  Medications:  Antibiotics Given (last 72 hours)    Date/Time Action Medication Dose Rate   01/21/17 1744 New Bag/Given   metroNIDAZOLE (FLAGYL) IVPB 500 mg 500 mg 100 mL/hr   01/21/17 1833 New Bag/Given   ceFEPIme (MAXIPIME) 2 g in dextrose 5 % 50 mL IVPB 2 g 100 mL/hr   01/22/17 0142 New Bag/Given   metroNIDAZOLE (FLAGYL) IVPB 500 mg 500 mg 100 mL/hr   01/22/17 1001 New Bag/Given   metroNIDAZOLE (FLAGYL) IVPB 500 mg 500 mg 100 mL/hr   01/22/17 1001 New Bag/Given   ciprofloxacin (CIPRO) IVPB 400 mg 400 mg 200 mL/hr   01/22/17 1715 New Bag/Given   meropenem (MERREM) 1 g in sodium chloride 0.9 % 100 mL IVPB 1 g 200 mL/hr   01/22/17 2215 New Bag/Given   meropenem (MERREM) 1 g in sodium chloride 0.9 % 100 mL IVPB 1 g 200 mL/hr   01/23/17 0529 New Bag/Given   meropenem (MERREM) 1 g in sodium chloride 0.9 % 100 mL IVPB 1 g 200 mL/hr     . enoxaparin (LOVENOX) injection  40 mg Subcutaneous Q24H  . metoprolol succinate  25 mg Oral Daily    Objective: Vital signs in last 24 hours: Temp:  [98.6 F (37 C)-99.2 F (37.3 C)] 98.8 F (37.1 C) (05/31 0531) Pulse Rate:  [96-120] 112 (05/31 0531) Resp:  [18-20] 20 (05/31 0531) BP: (106-117)/(52-70) 117/69 (05/31 0531) SpO2:  [96 %-98 %] 97 % (05/31 0531) Constitutional:  oriented to person, place, and time. appears well-developed and well-nourished. No distress.  HENT: [place]/AT, PERRLA, no scleral icterus Mouth/Throat: Oropharynx is clear and moist. No  oropharyngeal exudate.  Cardiovascular: Normal rate, regular rhythm and normal heart sounds.  Pulmonary/Chest: Effort normal and breath sounds normal. No respiratory distress.  has no wheezes.  Neck = supple, no nuchal rigidity Abdominal: Soft. Wound vac over ant abd incision site. 2 JP drains in RLQ and pelvic region with thin serous drainage Lymphadenopathy: no cervical adenopathy. No axillary adenopathy Neurological: alert and oriented to person, place, and time.  Skin: mild erythema on bil LE Psychiatric: a normal mood and affect.  behavior is normal.  Lab Results  Recent Labs  01/22/17 0411 01/22/17 1523 01/23/17 0706  WBC 20.6*  --  20.5*  HGB 10.0*  --  8.7*  HCT 30.8*  --  26.6*  NA  --  130* 135  K  --  4.0 3.6  CL  --  94* 101  CO2  --  29 28  BUN  --  8 6  CREATININE  --  0.58 0.48    Microbiology: Results for orders placed or performed during the hospital encounter of 01/21/17  Urine culture     Status: None   Collection Time: 01/21/17 12:13 PM  Result Value Ref Range Status   Specimen Description URINE, RANDOM  Final   Special Requests NONE  Final   Culture   Final    NO  GROWTH Performed at [facility], [place] [address] [place],  [address]    Report Status 01/22/2017 FINAL  Final  Culture, blood (Routine x 2)     Status: None (Preliminary result)   Collection Time: 01/21/17 12:23 PM  Result Value Ref Range Status   Specimen Description BLOOD LEFT FOREARM  Final   Special Requests BOTTLES DRAWN AEROBIC AND ANAEROBIC BCAV  Final   Culture NO GROWTH 3 DAYS  Final   Report Status PENDING  Incomplete  Culture, blood (Routine x 2)     Status: None (Preliminary result)   Collection Time: 01/21/17 12:36 PM  Result Value Ref Range Status   Specimen Description BLOOD RIGHT HAND  Final   Special Requests   Final    BOTTLES DRAWN AEROBIC AND ANAEROBIC Blood Culture adequate volume   Culture NO GROWTH 3 DAYS  Final   Report Status PENDING  Incomplete     Studies/Results: Ct Abdomen Pelvis W Contrast  Result Date: 01/21/2017 CLINICAL DATA:  Tachycardia and leukocytosis with history of intra-abdominal abscess after rupture of bowel. Positive for cancer although etiology is unknown. EXAM: CT ABDOMEN AND PELVIS WITH CONTRAST TECHNIQUE: Multidetector CT imaging of the abdomen and pelvis was performed using the standard protocol following bolus administration of intravenous contrast. CONTRAST:  123mL ISOVUE-300 IOPAMIDOL (ISOVUE-300) INJECTION 61% COMPARISON:  01/08/2017 FINDINGS: Lower chest: Interval increase in size and number of pulmonary nodular opacities within the visualized lung bases, example series 4, image 11 in the right middle lobe now measuring 11 mm versus 5 mm on prior exam. No effusion or pneumothorax. Normal visualized cardiac chamber size. No pericardial effusion. Hepatobiliary: Interval increase in size and number of peripherally enhancing hepatic lesions, new adjacent to the gallbladder fossa on series 2, image 30 measuring 10 mm and slightly larger dominant lesion in the left hepatic lobe estimated at 17 x 13 mm versus approximately 14 mm previously. Gallbladder is physiologically distended without calculi. Minimal intrahepatic ductal dilatation in the left hepatic lobe adjacent to the dominant presumed metastatic lesion. Adjacent to the right hepatic lobe along its caudal aspect is a 16 mm circumscribed hypodensity, smaller in appearance than on prior which had measured approximately 21 mm in diameter. Small abscess or necrotic metastasis might account for this appearance, series 2, image 40. Pancreas: Unremarkable. No pancreatic ductal dilatation or surrounding inflammatory changes. Spleen: Normal in size without focal abnormality. Adrenals/Urinary Tract: Normal bilateral adrenal glands and right kidney. New hypodensity in the interpolar aspect of the left kidney measuring 14 x 12 mm. Differential possibilities may include a renal  neoplasm, focus of infection, metastasis or possibly infarct, series 2, image 32. No nephrolithiasis nor obstructive uropathy. The urinary bladder is physiologically distended. Stomach/Bowel: Physiologic distention of the stomach. Normal small bowel rotation. Left lower quadrant colostomy. Status post partial resection sigmoid. Vascular/Lymphatic: Small mesenteric nodules are noted in the mid pelvis measuring up to 7 mm short axis and may represent reactive or metastatic adenopathy. The aorta and iliac vessels are unremarkable. Reproductive: Heterogeneous enhancement of uterus consistent with fibroids. Reportly, both ovaries have been removed. Other: Inflammatory/phlegmonous change about the uterus at site of prior pelvic abscesses. These collections are more thick-walled in appearance and minimally larger on the right measuring 30 x 14 mm versus 30 x 10 mm previously. 11 x 5 mm left-sided thick-walled abscess versus 24 x 15 mm previously. Percutaneous pigtail drainage catheter from right lower quadrant approach is noted coiled in the right hemipelvis anteriorly as before. Musculoskeletal:  No acute or significant osseous findings. IMPRESSION: 1. Interval increase in size and number of pulmonary and hepatic lesions consistent with worsening metastatic disease. 2. New ill-defined 14 x 12 mm interpolar hypodensity associated with the left kidney which may reflect a focus of infection, infarct, or neoplasm/metastasis. 3. Phlegmonous change in the pelvis adjacent to the uterus with slightly thicker walled presumed abscess collections minimally larger on the right and smaller on the left. A pigtail percutaneous drainage catheter is seen just anterior to the larger right-sided collection. 4. Smaller circumscribed subhepatic 16 mm hypodensity versus 21 mm previously potentially representing a small abscess or necrotic metastasis. 5. Small subcentimeter mesenteric lymph nodes are noted as above described measuring up to 7  mm. Reactive versus metastatic. Electronically Signed   By: Ashley Royalty M.D.   On: 01/21/2017 15:37    Assessment/Plan: Anna [name] is a 51 y.o. female with recent dx of metastatic adenocarcinoma of unknown primary , s.p colonic perforation and Hartmanns pouch procedure May 8 complicated by intra-abdominal abscess. May 8 cx grew E coli, Eikenella, viridans strep.  May 13 had placement of drain with findings of 40 ML fluid and debris - cx negative.  Dced on cipro and flagyl but had worsening wbc up to 24 K so readmitted. Started on cefepime but developed a rash. CT done showed worsening metastatic disease, phlegmon in pelvis with presummed abscess collections with pigtail cath in place.  She has been seen by surgery who feels no need for repeat surgical drainage nor drain placement. Discussed with Dr Janeth Rase who plans to start immunotherapy next week hopefully.  She is tolerating the meropenem so far except for faint rash on legs which comes and goes.  Recommendations Continue meropenem.  This will provide coverage of all isolated organisms and also any resistant organisms which may be contributing to persistence of the abscess.  Plan on  2-4 weeks IV abx therapy. May yet require repeat drain placement. Will see in clinic in 2 weeks.  I think given the rapid progression of the tumors I would not delay immunotherapy until this infection is healed as will take several weeks and may progress to needing repeat drainage whether or not she gets immunotherapy Thank you very much for the consult. Will follow with you.  Tyrus Wilms P   01/23/2017, 10:12 AM

## 2017-01-23 NOTE — Progress Notes (Signed)
   01/23/17 1700  Clinical Encounter Type  Visited With Family  Visit Type Follow-up  Referral From Chaplain  Spiritual Encounters  Spiritual Needs Emotional;Grief support;Prayer

## 2017-01-23 NOTE — Progress Notes (Signed)
Peripherally Inserted Central Catheter/Midline Placement  The IV Nurse has discussed with the patient and/or persons authorized to consent for the patient, the purpose of this procedure and the potential benefits and risks involved with this procedure.  The benefits include less needle sticks, lab draws from the catheter, and the patient may be discharged home with the catheter. Risks include, but not limited to, infection, bleeding, blood clot (thrombus formation), and puncture of an artery; nerve damage and irregular heartbeat and possibility to perform a PICC exchange if needed/ordered by physician.  Alternatives to this procedure were also discussed.  [employer] PICC patient education guide, fact sheet on infection prevention and patient information card has been provided to patient /or left at bedside.    PICC/Midline Placement Documentation        Wyeth Hoffer, Nicolette Bang 01/23/2017, 3:42 PM

## 2017-01-23 NOTE — Progress Notes (Signed)
CC: metastatic [place] unknown primary   Subjective: Rash improved, placed on meropenem  W/o side effects. Wbc hovering at 20 Taking PO No abd pain Colostomy functioning  Objective: Vital signs in last 24 hours: Temp:  [98.6 F (37 C)-99.2 F (37.3 C)] 98.8 F (37.1 C) (05/31 0531) Pulse Rate:  [96-121] 112 (05/31 0531) Resp:  [18-20] 20 (05/31 0531) BP: (105-117)/(52-70) 117/69 (05/31 0531) SpO2:  [96 %-98 %] 97 % (05/31 0531) Last BM Date: 01/23/17  Intake/Output from previous day: 05/30 0701 - 05/31 0700 In: 2334.6 [P.O.:120; I.V.:1514.6; IV Piggyback:700] Out: 1900 [Urine:1900] Intake/Output this shift: No intake/output data recorded.   Physical exam: NAD, awake alert Abd: soft, NT, wound vac in place, no infection, no peritonitis. Ostomy pink and patent. Drains in place serous fluid. Ext: no edema, well perfused, warm    Lab Results: CBC   Recent Labs  01/22/17 0411 01/23/17 0706  WBC 20.6* 20.5*  HGB 10.0* 8.7*  HCT 30.8* 26.6*  PLT 746* 706*   BMET  Recent Labs  01/22/17 1523 01/23/17 0706  NA 130* 135  K 4.0 3.6  CL 94* 101  CO2 29 28  GLUCOSE 244* 104*  BUN 8 6  CREATININE 0.58 0.48  CALCIUM 8.1* 7.4*   PT/INR  Recent Labs  01/21/17 1223  LABPROT 14.5  INR 1.12   ABG No results for input(s): PHART, HCO3 in the last 72 hours.  Invalid input(s): PCO2, PO2  Studies/Results: Ct Abdomen Pelvis W Contrast  Result Date: 01/21/2017 CLINICAL DATA:  Tachycardia and leukocytosis with history of intra-abdominal abscess after rupture of bowel. Positive for cancer although etiology is unknown. EXAM: CT ABDOMEN AND PELVIS WITH CONTRAST TECHNIQUE: Multidetector CT imaging of the abdomen and pelvis was performed using the standard protocol following bolus administration of intravenous contrast. CONTRAST:  162mL ISOVUE-300 IOPAMIDOL (ISOVUE-300) INJECTION 61% COMPARISON:  01/08/2017 FINDINGS: Lower chest: Interval increase in size and number of  pulmonary nodular opacities within the visualized lung bases, example series 4, image 11 in the right middle lobe now measuring 11 mm versus 5 mm on prior exam. No effusion or pneumothorax. Normal visualized cardiac chamber size. No pericardial effusion. Hepatobiliary: Interval increase in size and number of peripherally enhancing hepatic lesions, new adjacent to the gallbladder fossa on series 2, image 30 measuring 10 mm and slightly larger dominant lesion in the left hepatic lobe estimated at 17 x 13 mm versus approximately 14 mm previously. Gallbladder is physiologically distended without calculi. Minimal intrahepatic ductal dilatation in the left hepatic lobe adjacent to the dominant presumed metastatic lesion. Adjacent to the right hepatic lobe along its caudal aspect is a 16 mm circumscribed hypodensity, smaller in appearance than on prior which had measured approximately 21 mm in diameter. Small abscess or necrotic metastasis might account for this appearance, series 2, image 40. Pancreas: Unremarkable. No pancreatic ductal dilatation or surrounding inflammatory changes. Spleen: Normal in size without focal abnormality. Adrenals/Urinary Tract: Normal bilateral adrenal glands and right kidney. New hypodensity in the interpolar aspect of the left kidney measuring 14 x 12 mm. Differential possibilities may include a renal neoplasm, focus of infection, metastasis or possibly infarct, series 2, image 32. No nephrolithiasis nor obstructive uropathy. The urinary bladder is physiologically distended. Stomach/Bowel: Physiologic distention of the stomach. Normal small bowel rotation. Left lower quadrant colostomy. Status post partial resection sigmoid. Vascular/Lymphatic: Small mesenteric nodules are noted in the mid pelvis measuring up to 7 mm short axis and may represent reactive or metastatic adenopathy. The aorta  and iliac vessels are unremarkable. Reproductive: Heterogeneous enhancement of uterus consistent with  fibroids. Reportly, both ovaries have been removed. Other: Inflammatory/phlegmonous change about the uterus at site of prior pelvic abscesses. These collections are more thick-walled in appearance and minimally larger on the right measuring 30 x 14 mm versus 30 x 10 mm previously. 11 x 5 mm left-sided thick-walled abscess versus 24 x 15 mm previously. Percutaneous pigtail drainage catheter from right lower quadrant approach is noted coiled in the right hemipelvis anteriorly as before. Musculoskeletal: No acute or significant osseous findings. IMPRESSION: 1. Interval increase in size and number of pulmonary and hepatic lesions consistent with worsening metastatic disease. 2. New ill-defined 14 x 12 mm interpolar hypodensity associated with the left kidney which may reflect a focus of infection, infarct, or neoplasm/metastasis. 3. Phlegmonous change in the pelvis adjacent to the uterus with slightly thicker walled presumed abscess collections minimally larger on the right and smaller on the left. A pigtail percutaneous drainage catheter is seen just anterior to the larger right-sided collection. 4. Smaller circumscribed subhepatic 16 mm hypodensity versus 21 mm previously potentially representing a small abscess or necrotic metastasis. 5. Small subcentimeter mesenteric lymph nodes are noted as above described measuring up to 7 mm. Reactive versus metastatic. Electronically Signed   By: Ashley Royalty M.D.   On: 01/21/2017 15:37    Anti-infectives: Anti-infectives    Start     Dose/Rate Route Frequency Ordered Stop   01/23/17 0000  meropenem 1 g in sodium chloride 0.9 % 100 mL     1 g 200 mL/hr over 30 Minutes Intravenous Every 8 hours 01/23/17 0903     01/22/17 1600  meropenem (MERREM) 1 g in sodium chloride 0.9 % 100 mL IVPB     1 g 200 mL/hr over 30 Minutes Intravenous Every 8 hours 01/22/17 1550     01/22/17 1000  ciprofloxacin (CIPRO) IVPB 400 mg  Status:  Discontinued     400 mg 200 mL/hr over 60  Minutes Intravenous Every 12 hours 01/22/17 0806 01/22/17 1550   01/22/17 0200  metroNIDAZOLE (FLAGYL) IVPB 500 mg  Status:  Discontinued     500 mg 100 mL/hr over 60 Minutes Intravenous Every 8 hours 01/21/17 1905 01/22/17 1550   01/22/17 0200  ceFEPIme (MAXIPIME) 2 g in dextrose 5 % 50 mL IVPB  Status:  Discontinued     2 g 100 mL/hr over 30 Minutes Intravenous Every 8 hours 01/21/17 1918 01/22/17 0000   01/21/17 2200  ceFEPIme (MAXIPIME) 2 g in dextrose 5 % 50 mL IVPB  Status:  Discontinued     2 g 100 mL/hr over 30 Minutes Intravenous Every 8 hours 01/21/17 1905 01/21/17 1917   01/21/17 1745  ceFEPIme (MAXIPIME) 2 g in dextrose 5 % 50 mL IVPB     2 g 100 mL/hr over 30 Minutes Intravenous STAT 01/21/17 1737 01/21/17 2134   01/21/17 1744  ceFEPIme (MAXIPIME) 1 GM/50ML IVPB  Status:  Discontinued    Comments:  [place], Amy: cabinet override      01/21/17 1744 01/21/17 1740   01/21/17 1645  levofloxacin (LEVAQUIN) IVPB 750 mg  Status:  Discontinued     750 mg 100 mL/hr over 90 Minutes Intravenous  Once 01/21/17 1630 01/21/17 1740   01/21/17 1645  metroNIDAZOLE (FLAGYL) IVPB 500 mg     500 mg 100 mL/hr over 60 Minutes Intravenous  Once 01/21/17 1630 01/21/17 2229      Assessment/Plan: Doing well Continue meropenem No surgical  intervention Oncology to re-evaluate immunorx No surgical indication at this time  Caroleen Hamman, MD, [facility]  01/23/2017

## 2017-01-23 NOTE — Progress Notes (Signed)
CH initiated a follow-up with family; Anna [name] (Pt) out of room for test; [name] (husband) present. [place] offered listening presence, grief support, emotional support. [name] processed many feelings regarding Elon's condition. CH offered prayer. [name] is surrounded by family and church family.

## 2017-01-23 NOTE — Progress Notes (Signed)
Anna [name]   DOB:1966/01/05   DG#:[id number]    Subjective: Patient resting comfortably. Denies any shortness of breath or chest pain. No fevers overnight. Skin rash improving not completely resolved. She is currently on meropenem. No diarrhea.   Objective:  Vitals:   01/23/17 1223 01/23/17 2023  BP: 125/74 (!) 113/59  Pulse: (!) 111 (!) 133  Resp: 18 20  Temp: 98.8 F (37.1 C) 99.4 F (37.4 C)     Intake/Output Summary (Last 24 hours) at 01/23/17 2201 Last data filed at 01/23/17 1600  Gross per 24 hour  Intake          1661.25 ml  Output             1400 ml  Net           261.25 ml    GENERAL:Thin  built moderately nourished; Alert, no distress and comfortable.She is alone.  EYES: no pallor or icterus OROPHARYNX: no thrush or ulceration. NECK: supple, no masses felt LYMPH:  no palpable lymphadenopathy in the cervical, axillary or inguinal regions LUNGS: decreased breath sounds to auscultation at bases and  No wheeze or crackles HEART/CVS: Tachycardia; regular rhythm and no murmurs; No lower extremity edema ABDOMEN: abdomen soft, non-tender and normal bowel sounds; positive for colostomy.  Musculoskeletal:no cyanosis of digits and no clubbing  PSYCH: alert & oriented x 3 with fluent speech NEURO: no focal motor/sensory deficits SKIN:  no rashes or significant lesions  Labs:  Lab Results  Component Value Date   WBC 20.5 (H) 01/23/2017   HGB 8.7 (L) 01/23/2017   HCT 26.6 (L) 01/23/2017   MCV 92.4 01/23/2017   PLT 706 (H) 01/23/2017   NEUTROABS 22.6 (H) 01/21/2017    Lab Results  Component Value Date   NA 135 01/23/2017   K 3.6 01/23/2017   CL 101 01/23/2017   CO2 28 01/23/2017    Studies:  Dg Chest 1 View  Result Date: 01/23/2017 CLINICAL DATA:  Pre V/Q scan examination. EXAM: CHEST 1 VIEW COMPARISON:  CT scan chest of Dec 30, 2016 FINDINGS: The lungs are adequately inflated. There is subtle nodularity within both lungs with the largest nodule noted in  the rib right upper lung measuring just under 2 cm in greatest dimension. There is no alveolar infiltrate. A trace of pleural fluid at the left lung base laterally is suspected. The heart is top-normal in size. The pulmonary vascularity is normal. There is soft tissue fullness in the right hilar region. The mediastinum is normal in width. IMPRESSION: Subtle pulmonary nodules bilaterally worrisome for metastatic disease. No discrete pneumonia. Small left pleural effusion. Probable right hilar lymphadenopathy. Electronically Signed   By: David  Martinique M.D.   On: 01/23/2017 12:44   Ct Angio Chest Pe W Or Wo Contrast  Result Date: 01/23/2017 CLINICAL DATA:  51 year old with recent diagnosis of metastatic adenocarcinoma of unknown primary, presenting with tachycardia and sepsis. Patient had colonic perforation with a large intra-abdominal abscess on 12/30/2016 for which she underwent colon resection. Ventilation-perfusion lung scan earlier today indicated high probability of pulmonary embolism. One view chest x-ray obtained earlier today demonstrated bilateral lung nodules. EXAM: CT ANGIOGRAPHY CHEST WITH CONTRAST TECHNIQUE: Multidetector CT imaging of the chest was performed using the standard protocol during bolus administration of intravenous contrast. Multiplanar CT image reconstructions and MIPs were obtained to evaluate the vascular anatomy. CONTRAST:  75 mL Isovue 370 IV. COMPARISON:  CT chest abdomen and pelvis 12/30/2016. Chest X leg and nuclear medicine V/Q  scan performed earlier today. FINDINGS: Cardiovascular: Contrast opacification of pulmonary arteries is very good. Filling defects within segmental branches of the right upper lobe and right middle lobe pulmonary arteries. Marked narrowing of the right middle lobe pulmonary artery due to surrounding nodal tissue. Central pulmonary arteries patent. No evidence of right heart strain. Heart size upper normal. No pericardial effusion. No visible coronary  atherosclerosis. No visible atherosclerosis involving the thoracic or upper abdominal aorta or their visualized branches. Mediastinum/Nodes: Right hilar lymphadenopathy which surrounds and compresses the right middle lobe pulmonary artery. No pathologic mediastinal, left hilar or axillary lymphadenopathy. Normal appearing esophagus. Thyroid gland normal in appearance. Lungs/Pleura: Numerous bilateral pulmonary nodules. Index right upper lobe nodule measures approximately 1.9 x 1.6 cm, mean 1.8 cm (series 7, image 29). Index lingular nodule adjacent to the major fissure measures approximately 1.7 x 2.2 cm, mean 2.0 cm (series 7, image 59). Peribronchial and possible lymphangitic spread of tumor in the right lung and left lower lobe. Small left pleural effusion. No right pleural effusion. Dense atelectasis dependently in the lower lobes. Upper Abdomen: Unremarkable for the early arterial phase of enhancement which accounts for the heterogeneous appearance of the spleen. Musculoskeletal: Regional skeleton intact without acute or significant osseous abnormality. Specifically, no evidence of osseous metastatic disease. Review of the MIP images confirms the above findings. IMPRESSION: 1. Acute pulmonary emboli involving segmental branches of the right upper lobe and right middle lobe pulmonary artery. There is no evidence of right heart strain. 2. Right hilar lymphadenopathy which surrounds and narrows the right middle lobe pulmonary artery. 3. Numerous bilateral pulmonary metastases. Index lesions in the right upper lobe and lingula are measured above. 4. Peribronchial and possible lymphangitic spread of tumor in the right lung and in the left lower lobe. 5. Small left pleural effusion. 6. Dependent dense atelectasis in the lower lobes. Electronically Signed   By: Evangeline Dakin M.D.   On: 01/23/2017 14:14   Nm Pulmonary Perf And Vent  Result Date: 01/23/2017 CLINICAL DATA:  Tachycardia and sepsis.  Recent  surgeries. EXAM: NUCLEAR MEDICINE VENTILATION - PERFUSION LUNG SCAN TECHNIQUE: Ventilation images were obtained in multiple projections using inhaled aerosol Tc-64mDTPA. Perfusion images were obtained in multiple projections after intravenous injection of Tc-939mAA. RADIOPHARMACEUTICALS:  34.2 MCi Technetium-9962mPA aerosol inhalation and 4.4 mCi Technetium-4m63m IV COMPARISON:  Chest radiograph from today FINDINGS: Ventilation: No focal ventilation defect. Perfusion: There are 2 large segmental perfusion defects involving the right upper and lower lobe. Within the superior segment of left lower lobe there is a moderate size segmental perfusion defect. IMPRESSION: High probability for acute pulmonary embolus. Electronically Signed   By: TaylKerby Moors.   On: 01/23/2017 12:22   Us VKoreaous Img Lower Bilateral  Result Date: 01/23/2017 CLINICAL DATA:  Acute pulmonary emboli.  Evaluate for source. EXAM: BILATERAL LOWER EXTREMITY VENOUS DOPPLER ULTRASOUND TECHNIQUE: Gray-scale sonography with graded compression, as well as color Doppler and duplex ultrasound were performed to evaluate the lower extremity deep venous systems from the level of the common femoral vein and including the common femoral, femoral, profunda femoral, popliteal and calf veins including the posterior tibial, peroneal and gastrocnemius veins when visible. The superficial great saphenous vein was also interrogated. Spectral Doppler was utilized to evaluate flow at rest and with distal augmentation maneuvers in the common femoral, femoral and popliteal veins. COMPARISON:  None. FINDINGS: RIGHT LOWER EXTREMITY Common Femoral Vein: No evidence of thrombus. Normal compressibility, respiratory phasicity and response to augmentation. Saphenofemoral  Junction: No evidence of thrombus. Normal compressibility and flow on color Doppler imaging. Profunda Femoral Vein: No evidence of thrombus. Normal compressibility and flow on color Doppler imaging.  Femoral Vein: No evidence of thrombus. Normal compressibility, respiratory phasicity and response to augmentation. Popliteal Vein: No evidence of thrombus. Normal compressibility, respiratory phasicity and response to augmentation. Calf Veins: No evidence of thrombus. Normal compressibility and flow on color Doppler imaging. Superficial Great Saphenous Vein: Not evaluated. Venous Reflux:  Not evaluated. Other Findings:  None. LEFT LOWER EXTREMITY Common Femoral Vein: No evidence of thrombus. Normal compressibility, respiratory phasicity and response to augmentation. Saphenofemoral Junction: No evidence of thrombus. Normal compressibility and flow on color Doppler imaging. Profunda Femoral Vein: No evidence of thrombus. Normal compressibility and flow on color Doppler imaging. Femoral Vein: No evidence of thrombus. Normal compressibility, respiratory phasicity and response to augmentation. Popliteal Vein: No evidence of thrombus. Normal compressibility, respiratory phasicity and response to augmentation. Calf Veins: Occlusive thrombus within one of the paired posterior tibial veins. The paired peroneal veins are patent. Superficial Great Saphenous Vein: Not evaluated. Venous Reflux:  Not evaluated. Other Findings: Left popliteal fossa cyst measuring approximately 4.6 x 1.2 x 1.9 cm. IMPRESSION: 1. Occlusive thrombus involving one of the paired left posterior tibial veins. 2. No evidence of DVT elsewhere in the left lower extremity. 3. No evidence of right lower extremity DVT. 4. Baker's cyst involving the left popliteal fossa, measured above. Electronically Signed   By: Evangeline Dakin M.D.   On: 01/23/2017 20:17    Assessment & Plan:   #  51 year old female patient with carcinoma of unknown primary; and recent pelvic abscess- is currently admitted to the hospital for fever/sepsis.  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 PDL-1 50%. Requested a second opinion consultation of the pathology at [facility]. [place] 1 workup. Given the rapidly progressive malignancy -We'll plan to start immunotherapy either next week/ once infection is better controlled. Poor candidate for chemotherapy given the active infection.   # Pelvic abscess status post- IR guided drain. Patient currently on meropenem [since having a reaction to cefepime]. Better tolerated. No rash overnight. Patient likely to be discharged on IV antibiotics thru PICC line. CRP elevated at 8/ also leucocytosis/stable ~20.   # Acute PE- causing tachycardia. Currently on IV heparin/ plan transition to oral anticoagulation.   # discussed with patient in detail. Patient will be followed by one of my partners in my absence next week to start immunotherapy/keytruda.   Cammie Sickle, MD 01/23/2017  10:01 PM

## 2017-01-23 NOTE — Progress Notes (Signed)
ANTICOAGULATION CONSULT NOTE - Initial Consult  Pharmacy Consult for Heparin  Indication: pulmonary embolus  Allergies  Allergen Reactions  . Cefepime Rash  . Penicillins Rash    As a child. Has patient had a PCN reaction causing immediate rash, facial/tongue/throat swelling, SOB or lightheadedness with hypotension: Unknown Has patient had a PCN reaction causing severe rash involving mucus membranes or skin necrosis: Unknown Has patient had a PCN reaction that required hospitalization: No Has patient had a PCN reaction occurring within the last 10 years: No If all of the above answers are "NO", then may proceed with Cephalosporin use.     Patient Measurements: Height: 5\' 3"  (160 cm) Weight: 121 lb 14.4 oz (55.3 kg) IBW/kg (Calculated) : 52.4  Vital Signs: Temp: 98.8 F (37.1 C) (05/31 1223) Temp Source: Oral (05/31 1223) BP: 125/74 (05/31 1223) Pulse Rate: 111 (05/31 1223)  Labs:  Recent Labs  01/21/17 1223 01/22/17 0411 01/22/17 1523 01/23/17 0706  HGB 9.8* 10.0*  --  8.7*  HCT 29.9* 30.8*  --  26.6*  PLT 678* 746*  --  706*  LABPROT 14.5  --   --   --   INR 1.12  --   --   --   CREATININE 0.46  --  0.58 0.48    Estimated Creatinine Clearance: 68 mL/min (by C-G formula based on SCr of 0.48 mg/dL).   Medical History: Past Medical History:  Diagnosis Date  . Cancer ([place])   . Cancer with unknown primary site [facility])   . Colon polyp   . Diverticulosis   . Fibroid uterus   . Heme positive stool 08/01/2014  . HH (hiatus hernia)   . Large bowel perforation ([place]) 12/31/2016  . Metastatic adenocarcinoma ([place]) 12/20/2016  . Pelvic pain in female 01/13/2017  . Perforation of sigmoid colon [facility])     Assessment: Pharmacy consulted for heparin dosing and monitoring for PE in 51 yo female  Goal of Therapy:  Heparin level 0.3-0.7 units/ml Monitor platelets by anticoagulation protocol: Yes   Plan:  Baseline labs ordered. Patient did receive dose of enoxaparin last  night at 2200.  Give 3500 units bolus x 1 Start heparin infusion at 900 units/hr Check anti-Xa level in 6 hours and daily while on heparin Continue to monitor H&H and platelets  Enoxaparin has been discontinued.   Pernell Dupre, PharmD, BCPS Clinical Pharmacist 01/23/2017 2:45 PM

## 2017-01-23 NOTE — Care Management (Signed)
Spoke with Dr. Ether Griffins this morning. Will need Meropenem 1 gram IV every 8 hours in the home for a total of 42 doses. PICC line ordered. Floydene Flock, [facility] representative updated. Shelbie Ammons RN MSN CCM Care Management [phone]

## 2017-01-24 DIAGNOSIS — K6289 Other specified diseases of anus and rectum: Secondary | ICD-10-CM

## 2017-01-24 DIAGNOSIS — I2699 Other pulmonary embolism without acute cor pulmonale: Secondary | ICD-10-CM

## 2017-01-24 LAB — CBC
HCT: 26 % — ABNORMAL LOW (ref 35.0–47.0)
HEMOGLOBIN: 8.5 g/dL — AB (ref 12.0–16.0)
MCH: 29.8 pg (ref 26.0–34.0)
MCHC: 32.8 g/dL (ref 32.0–36.0)
MCV: 90.9 fL (ref 80.0–100.0)
Platelets: 653 10*3/uL — ABNORMAL HIGH (ref 150–440)
RBC: 2.86 MIL/uL — ABNORMAL LOW (ref 3.80–5.20)
RDW: 17 % — AB (ref 11.5–14.5)
WBC: 15.9 10*3/uL — ABNORMAL HIGH (ref 3.6–11.0)

## 2017-01-24 LAB — HEPARIN LEVEL (UNFRACTIONATED): HEPARIN UNFRACTIONATED: 0.3 [IU]/mL (ref 0.30–0.70)

## 2017-01-24 MED ORDER — APIXABAN 5 MG PO TABS: 5.0000 mg | ORAL_TABLET | Freq: Two times a day (BID) | ORAL | Status: DC

## 2017-01-24 MED ORDER — APIXABAN 5 MG PO TABS: 5.0000 mg | ORAL_TABLET | Freq: Two times a day (BID) | ORAL | 5 refills | Status: DC

## 2017-01-24 MED ORDER — APIXABAN 5 MG PO TABS
10.0000 mg | ORAL_TABLET | Freq: Two times a day (BID) | ORAL | Status: DC
  Administered 2017-01-24: 09:00:00 10 mg via ORAL
  Filled 2017-01-24: qty 2

## 2017-01-24 NOTE — Addendum Note (Signed)
Addended by: Sabino Gasser on: 01/24/2017 08:42 AM   Modules accepted: Orders

## 2017-01-24 NOTE — Care Management (Addendum)
Discharge to home today per Dr. Ether Griffins. Floydene Flock, [facility] representative updated, Nurse will be out to Ms. Jerde home at 4:00pm to teach about  Meropenum administration.  Eliquis coupon given Shelbie Ammons RN MSN [facility] [phone]

## 2017-01-24 NOTE — Discharge Summary (Signed)
[facility] [place] NAME: Anna [name]    MR#:  [id number]  DATE OF BIRTH:  23-Jan-1966  DATE OF ADMISSION:  01/21/2017 ADMITTING PHYSICIAN: Fritzi Mandes, MD  DATE OF DISCHARGE: 01/24/2017  2:33 PM  PRIMARY CARE PHYSICIAN: McLean-Scocuzza, Nino Glow, MD     ADMISSION DIAGNOSIS:  Phlegmonous peritonitis ([place]) [K65.0] Leukocytosis, unspecified type [D72.829]  DISCHARGE DIAGNOSIS:  Active Problems:   Sepsis ([place])   Intra-abdominal abscess ([place])   Leukocytosis   Hypokalemia   Hyponatremia   Hypomagnesemia   Drug eruption   Sinus tachycardia   Acute pulmonary embolism ([place])   SECONDARY DIAGNOSIS:   Past Medical History:  Diagnosis Date  . Cancer ([place])   . Cancer with unknown primary site [facility])   . Colon polyp   . Diverticulosis   . Fibroid uterus   . Heme positive stool 08/01/2014  . HH (hiatus hernia)   . Large bowel perforation ([place]) 12/31/2016  . Metastatic adenocarcinoma ([place]) 12/20/2016  . Pelvic pain in female 01/13/2017  . Perforation of sigmoid colon ([place])     .pro HOSPITAL COURSE:  The patient is a 51 year old Caucasian female with past medical history significant for history of recently diagnosed adenocarcinoma of unknown primary, suspected gynecologic primary, who underwent Hartmann's procedure for sigmoid perforation 12/31/2016. She was discharged on Cipro and Flagyl, given her multiple pelvic abscesses which were drained during the last admission. Now she was readmitted due to elevated white blood cell count and fever. She was initiated on cefepime and Flagyl, however, developed a rash due to cefepime, which was stopped. Repeated CT scan of abdomen and pelvis revealed pelvic phlegmon, worsening metastatic disease. Patient had any abdominal pain, Continued to have tachycardia with heart rate around 120, despite improving metabolism. Patient's rash  improved on Benadryl and few doses of steroids, the patient was  changed to intravenous meropenem  By Dr. Ola Spurr recommendations. PICC line was placed May 31st 2018 for antibiotic therapy for the next several weeks with follow-up with  Dr. Ola Spurr in 1-2 weeks.  CT scan of chest with IV contrast done 01/23/2017 revealed PE. She is initiated on heparin intravenously, which was changed to Eliquis on the day of discharge. Patient was felt to be stable to be discharged home. Discussion by problem: #1. Sepsis due to pelvic abscesses , Continue patient on Meropenem intravenously, status post PICC line placement, patient will need to be on antibiotic therapy for the next 2-4 weeks, follow-up with Dr. Ola Spurr as outpatient, blood cultures were negative , urine culture showed no growth. The patient was seen by a surgeon, who recommended antibiotic therapy, but felt that patient is not a surgical candidate. The patient was also evaluated and seen by GYN oncologist, who recommended chemotherapy, but only after infection subsides. LDH was 173, normal #2. Leukocytosis, improved with therapy #3. Hyponatremia, hypokalemia, resolved with IV fluids, intravenous supplementation, magnesium level was found to be normal after supplementation #4. Hypomagnesemia, supplemented intravenously, normalized #5. Drug eruption, continue patient on Benadryl,  off prednisone due to active infection, improved, follow closely on meropenem, discussed with Dr. Ola Spurr #6 . Acute pulmonary embolism, continue Eliquis loading dose for the next 7 days, then maintenance indefinitely , Doppler ultrasound of lower extremities  revealed occlusive thrombus involving one of the paired left posterior tibial veins, the patient is to continue Eliquis  therapy and having her lower extremities rescanned again to ensure resolution,  may benefit from vascular surgery consultation as  outpatient ,  nocturnal oximetry study revealed good saturations, patient did not qualify for oxygen therapy at home. Discussed  with Dr. Rogue Bussing   DISCHARGE CONDITIONS:   Stable  CONSULTS OBTAINED:  Treatment Team:  Jules Husbands, MD Leonel Ramsay, MD  DRUG ALLERGIES:   Allergies  Allergen Reactions  . Cefepime Rash  . Penicillins Rash    As a child. Has patient had a PCN reaction causing immediate rash, facial/tongue/throat swelling, SOB or lightheadedness with hypotension: Unknown Has patient had a PCN reaction causing severe rash involving mucus membranes or skin necrosis: Unknown Has patient had a PCN reaction that required hospitalization: No Has patient had a PCN reaction occurring within the last 10 years: No If all of the above answers are "NO", then may proceed with Cephalosporin use.     DISCHARGE MEDICATIONS:   Discharge Medication List as of 01/24/2017  2:12 PM    START taking these medications   Details  apixaban (ELIQUIS) 5 MG TABS tablet Take 1 tablet (5 mg total) by mouth 2 (two) times daily. Please take 2 pills orally twice a day for 7 days as a loading dose, then continue 1 pill twice a day indefinitely, thank you, Starting Fri 01/31/2017, Normal    diphenhydrAMINE (BENADRYL) 25 mg capsule Take 1 capsule (25 mg total) by mouth every 6 (six) hours as needed (Rash)., Starting Thu 01/23/2017, Normal    meropenem 1 g in sodium chloride 0.9 % 100 mL Inject 1 g into the vein every 8 (eight) hours., Starting Thu 01/23/2017, Print      CONTINUE these medications which have NOT CHANGED   Details  acetaminophen (TYLENOL) 500 MG tablet Take 500 mg by mouth as needed for mild pain., Historical Med    metoprolol succinate (TOPROL-XL) 25 MG 24 hr tablet Take 25 mg by mouth daily., Starting Fri 01/17/2017, Historical Med    oxyCODONE-acetaminophen (ROXICET) 5-325 MG tablet Take 1 tablet by mouth every 6 (six) hours as needed., Starting Fri 01/10/2017, Until Sat 01/10/2018, Normal    estradiol (VIVELLE-DOT) 0.025 MG/24HR Place 1 patch onto the skin 2 (two) times a week., Historical Med        STOP taking these medications     ciprofloxacin (CIPRO) 500 MG tablet      metroNIDAZOLE (FLAGYL) 500 MG tablet          DISCHARGE INSTRUCTIONS:    The patient is to follow-up with primary care physician, oncologist, within 1 week after discharge, infectious disease specialist, Dr. Ola Spurr within 2 weeks after discharge  If you experience worsening of your admission symptoms, develop shortness of breath, life threatening emergency, suicidal or homicidal thoughts you must seek medical attention immediately by calling 911 or calling your MD immediately  if symptoms less severe.  You Must read complete instructions/literature along with all the possible adverse reactions/side effects for all the Medicines you take and that have been prescribed to you. Take any new Medicines after you have completely understood and accept all the possible adverse reactions/side effects.   Please note  You were cared for by a hospitalist during your hospital stay. If you have any questions about your discharge medications or the care you received while you were in the hospital after you are discharged, you can call the unit and asked to speak with the hospitalist on call if the hospitalist that took care of you is not available. Once you are discharged, your primary care physician will handle any further medical issues. Please  note that NO REFILLS for any discharge medications will be authorized once you are discharged, as it is imperative that you return to your primary care physician (or establish a relationship with a primary care physician if you do not have one) for your aftercare needs so that they can reassess your need for medications and monitor your lab values.    Today   CHIEF COMPLAINT:   Chief Complaint  Patient presents with  . Fever  . Abnormal Lab    HISTORY OF PRESENT ILLNESS:     VITAL SIGNS:  Blood pressure 124/71, pulse (!) 115, temperature 99 F (37.2 C), temperature  source Oral, resp. rate 18, height 5\' 3"  (1.6 m), weight 55.3 kg (121 lb 14.4 oz), last menstrual period 03/01/2015, SpO2 98 %.  I/O:   Intake/Output Summary (Last 24 hours) at 01/24/17 1851 Last data filed at 01/24/17 1005  Gross per 24 hour  Intake            363.1 ml  Output           2112.5 ml  Net          -1749.4 ml    PHYSICAL EXAMINATION:  GENERAL:  51 y.o.-year-old patient lying in the bed with no acute distress.  EYES: Pupils equal, round, reactive to light and accommodation. No scleral icterus. Extraocular muscles intact.  HEENT: Head atraumatic, normocephalic. Oropharynx and nasopharynx clear.  NECK:  Supple, no jugular venous distention. No thyroid enlargement, no tenderness.  LUNGS: Normal breath sounds bilaterally, no wheezing, rales,rhonchi or crepitation. No use of accessory muscles of respiration.  CARDIOVASCULAR: S1, S2 normal. No murmurs, rubs, or gallops.  ABDOMEN: Soft, non-tender, non-distended. Bowel sounds present. No organomegaly or mass.  EXTREMITIES: No pedal edema, cyanosis, or clubbing.  NEUROLOGIC: Cranial nerves II through XII are intact. Muscle strength 5/5 in all extremities. Sensation intact. Gait not checked.  PSYCHIATRIC: The patient is alert and oriented x 3.  SKIN: No obvious rash, lesion, or ulcer.   DATA REVIEW:   CBC  Recent Labs Lab 01/24/17 0618  WBC 15.9*  HGB 8.5*  HCT 26.0*  PLT 653*    Chemistries   Recent Labs Lab 01/21/17 1223  01/23/17 0706  NA 129*  < > 135  K 2.6*  < > 3.6  CL 87*  < > 101  CO2 32  < > 28  GLUCOSE 112*  < > 104*  BUN 14  < > 6  CREATININE 0.46  < > 0.48  CALCIUM 8.3*  < > 7.4*  MG  --   --  2.5*  AST 27  --   --   ALT 12*  --   --   ALKPHOS 56  --   --   BILITOT 0.6  --   --   < > = values in this interval not displayed.  Cardiac Enzymes No results for input(s): TROPONINI in the last 168 hours.  Microbiology Results  Results for orders placed or performed during the hospital encounter  of 01/21/17  Urine culture     Status: None   Collection Time: 01/21/17 12:13 PM  Result Value Ref Range Status   Specimen Description URINE, RANDOM  Final   Special Requests NONE  Final   Culture   Final    NO GROWTH Performed at [facility], [address] [place], [place] [address]    Report Status 01/22/2017 FINAL  Final  Culture, blood (Routine x 2)  Status: None (Preliminary result)   Collection Time: 01/21/17 12:23 PM  Result Value Ref Range Status   Specimen Description BLOOD LEFT FOREARM  Final   Special Requests BOTTLES DRAWN AEROBIC AND ANAEROBIC BCAV  Final   Culture NO GROWTH 3 DAYS  Final   Report Status PENDING  Incomplete  Culture, blood (Routine x 2)     Status: None (Preliminary result)   Collection Time: 01/21/17 12:36 PM  Result Value Ref Range Status   Specimen Description BLOOD RIGHT HAND  Final   Special Requests   Final    BOTTLES DRAWN AEROBIC AND ANAEROBIC Blood Culture adequate volume   Culture NO GROWTH 3 DAYS  Final   Report Status PENDING  Incomplete    RADIOLOGY:  Dg Chest 1 View  Result Date: 01/23/2017 CLINICAL DATA:  Pre V/Q scan examination. EXAM: CHEST 1 VIEW COMPARISON:  CT scan chest of Dec 30, 2016 FINDINGS: The lungs are adequately inflated. There is subtle nodularity within both lungs with the largest nodule noted in the rib right upper lung measuring just under 2 cm in greatest dimension. There is no alveolar infiltrate. A trace of pleural fluid at the left lung base laterally is suspected. The heart is top-normal in size. The pulmonary vascularity is normal. There is soft tissue fullness in the right hilar region. The mediastinum is normal in width. IMPRESSION: Subtle pulmonary nodules bilaterally worrisome for metastatic disease. No discrete pneumonia. Small left pleural effusion. Probable right hilar lymphadenopathy. Electronically Signed   By: David  Martinique M.D.   On: 01/23/2017 12:44   Ct Angio Chest Pe W Or Wo  Contrast  Result Date: 01/23/2017 CLINICAL DATA:  51 year old with recent diagnosis of metastatic adenocarcinoma of unknown primary, presenting with tachycardia and sepsis. Patient had colonic perforation with a large intra-abdominal abscess on 12/30/2016 for which she underwent colon resection. Ventilation-perfusion lung scan earlier today indicated high probability of pulmonary embolism. One view chest x-ray obtained earlier today demonstrated bilateral lung nodules. EXAM: CT ANGIOGRAPHY CHEST WITH CONTRAST TECHNIQUE: Multidetector CT imaging of the chest was performed using the standard protocol during bolus administration of intravenous contrast. Multiplanar CT image reconstructions and MIPs were obtained to evaluate the vascular anatomy. CONTRAST:  75 mL Isovue 370 IV. COMPARISON:  CT chest abdomen and pelvis 12/30/2016. Chest X leg and nuclear medicine V/Q scan performed earlier today. FINDINGS: Cardiovascular: Contrast opacification of pulmonary arteries is very good. Filling defects within segmental branches of the right upper lobe and right middle lobe pulmonary arteries. Marked narrowing of the right middle lobe pulmonary artery due to surrounding nodal tissue. Central pulmonary arteries patent. No evidence of right heart strain. Heart size upper normal. No pericardial effusion. No visible coronary atherosclerosis. No visible atherosclerosis involving the thoracic or upper abdominal aorta or their visualized branches. Mediastinum/Nodes: Right hilar lymphadenopathy which surrounds and compresses the right middle lobe pulmonary artery. No pathologic mediastinal, left hilar or axillary lymphadenopathy. Normal appearing esophagus. Thyroid gland normal in appearance. Lungs/Pleura: Numerous bilateral pulmonary nodules. Index right upper lobe nodule measures approximately 1.9 x 1.6 cm, mean 1.8 cm (series 7, image 29). Index lingular nodule adjacent to the major fissure measures approximately 1.7 x 2.2 cm, mean  2.0 cm (series 7, image 59). Peribronchial and possible lymphangitic spread of tumor in the right lung and left lower lobe. Small left pleural effusion. No right pleural effusion. Dense atelectasis dependently in the lower lobes. Upper Abdomen: Unremarkable for the early arterial phase of enhancement which accounts for the heterogeneous  appearance of the spleen. Musculoskeletal: Regional skeleton intact without acute or significant osseous abnormality. Specifically, no evidence of osseous metastatic disease. Review of the MIP images confirms the above findings. IMPRESSION: 1. Acute pulmonary emboli involving segmental branches of the right upper lobe and right middle lobe pulmonary artery. There is no evidence of right heart strain. 2. Right hilar lymphadenopathy which surrounds and narrows the right middle lobe pulmonary artery. 3. Numerous bilateral pulmonary metastases. Index lesions in the right upper lobe and lingula are measured above. 4. Peribronchial and possible lymphangitic spread of tumor in the right lung and in the left lower lobe. 5. Small left pleural effusion. 6. Dependent dense atelectasis in the lower lobes. Electronically Signed   By: Evangeline Dakin M.D.   On: 01/23/2017 14:14   Nm Pulmonary Perf And Vent  Result Date: 01/23/2017 CLINICAL DATA:  Tachycardia and sepsis.  Recent surgeries. EXAM: NUCLEAR MEDICINE VENTILATION - PERFUSION LUNG SCAN TECHNIQUE: Ventilation images were obtained in multiple projections using inhaled aerosol Tc-85m DTPA. Perfusion images were obtained in multiple projections after intravenous injection of Tc-66m MAA. RADIOPHARMACEUTICALS:  34.2 MCi Technetium-15m DTPA aerosol inhalation and 4.4 mCi Technetium-60m MAA IV COMPARISON:  Chest radiograph from today FINDINGS: Ventilation: No focal ventilation defect. Perfusion: There are 2 large segmental perfusion defects involving the right upper and lower lobe. Within the superior segment of left lower lobe there is a  moderate size segmental perfusion defect. IMPRESSION: High probability for acute pulmonary embolus. Electronically Signed   By: Kerby Moors M.D.   On: 01/23/2017 12:22   US Venous Img Lower Bilateral  Result Date: 01/23/2017 CLINICAL DATA:  Acute pulmonary emboli.  Evaluate for source. EXAM: BILATERAL LOWER EXTREMITY VENOUS DOPPLER ULTRASOUND TECHNIQUE: Gray-scale sonography with graded compression, as well as color Doppler and duplex ultrasound were performed to evaluate the lower extremity deep venous systems from the level of the common femoral vein and including the common femoral, femoral, profunda femoral, popliteal and calf veins including the posterior tibial, peroneal and gastrocnemius veins when visible. The superficial great saphenous vein was also interrogated. Spectral Doppler was utilized to evaluate flow at rest and with distal augmentation maneuvers in the common femoral, femoral and popliteal veins. COMPARISON:  None. FINDINGS: RIGHT LOWER EXTREMITY Common Femoral Vein: No evidence of thrombus. Normal compressibility, respiratory phasicity and response to augmentation. Saphenofemoral Junction: No evidence of thrombus. Normal compressibility and flow on color Doppler imaging. Profunda Femoral Vein: No evidence of thrombus. Normal compressibility and flow on color Doppler imaging. Femoral Vein: No evidence of thrombus. Normal compressibility, respiratory phasicity and response to augmentation. Popliteal Vein: No evidence of thrombus. Normal compressibility, respiratory phasicity and response to augmentation. Calf Veins: No evidence of thrombus. Normal compressibility and flow on color Doppler imaging. Superficial Great Saphenous Vein: Not evaluated. Venous Reflux:  Not evaluated. Other Findings:  None. LEFT LOWER EXTREMITY Common Femoral Vein: No evidence of thrombus. Normal compressibility, respiratory phasicity and response to augmentation. Saphenofemoral Junction: No evidence of thrombus.  Normal compressibility and flow on color Doppler imaging. Profunda Femoral Vein: No evidence of thrombus. Normal compressibility and flow on color Doppler imaging. Femoral Vein: No evidence of thrombus. Normal compressibility, respiratory phasicity and response to augmentation. Popliteal Vein: No evidence of thrombus. Normal compressibility, respiratory phasicity and response to augmentation. Calf Veins: Occlusive thrombus within one of the paired posterior tibial veins. The paired peroneal veins are patent. Superficial Great Saphenous Vein: Not evaluated. Venous Reflux:  Not evaluated. Other Findings: Left popliteal fossa cyst measuring approximately 4.6  x 1.2 x 1.9 cm. IMPRESSION: 1. Occlusive thrombus involving one of the paired left posterior tibial veins. 2. No evidence of DVT elsewhere in the left lower extremity. 3. No evidence of right lower extremity DVT. 4. Baker's cyst involving the left popliteal fossa, measured above. Electronically Signed   By: Evangeline Dakin M.D.   On: 01/23/2017 20:17    EKG:   Orders placed or performed during the hospital encounter of 12/30/16  . EKG 12-Lead  . EKG 12-Lead  . ED EKG  . ED EKG  . EKG      Management plans discussed with the patient, family and they are in agreement.  CODE STATUS:  Code Status History    Date Active Date Inactive Code Status Order ID Comments User Context   01/21/2017  9:00 PM 01/24/2017  5:38 PM Full Code [id number]  Fritzi Mandes, MD Inpatient   12/31/2016  3:58 AM 01/10/2017  4:29 PM Full Code [id number]  Jules Husbands, MD Inpatient   12/30/2016 10:48 PM 12/31/2016  3:58 AM Full Code [id number]  Schermerhorn, Gwen Her, MD ED   12/20/2016 11:16 AM 12/21/2016  1:51 PM Full Code [id number]  Schermerhorn, Gwen Her, MD Inpatient      TOTAL TIME TAKING CARE OF THIS PATIENT: 40 minutes.    Theodoro Grist M.D on 01/24/2017 at 6:51 PM  Between 7am to 6pm - Pager - [phone]  After 6pm go to [email or web address] - password [email or web address] [facility]  [facility]  Office  [phone]  CC: Primary care physician; McLean-Scocuzza, Nino Glow, MD

## 2017-01-24 NOTE — Progress Notes (Signed)
Patient discharged home with home health. All discharge instructions given and all questions answered. Patient and husband verbalized understanding of all discharge instructions.

## 2017-01-24 NOTE — Telephone Encounter (Signed)
V/o Per Dr. Rogue Bussing- RN add labs- cbc, tsh, metc, crp level  [place], Please schedule pt for chemo class on Tuesday 01/28/17; please schedule patient for lab/md/Keytruda (NEW)- on either 01/30/17 or 01/31/17 depending availability on infusion schedule.  Patient will be discharged from the inpatient unit today. Please contact the unit to ensure pt receives these apts.

## 2017-01-24 NOTE — Consult Note (Signed)
[place] Nurse wound follow up Reason for Consult: Midline surgical wound.  NPWT (VAC) therapy in place.  Will replace MEdela therapy for home use.  Potential discharge home today.  Will remove KCI VAC  LLQ Colostomy Wound type:Surgical Pressure Injury POA: N/A Measurement:16 cm x 4 cm x 0.6 cm  Wound FFM:BWGYK red granulation tissue noted.  Drainage (amount, consistency, odor) Minimal serosanguinous drainage in canister Periwound:Intact.  LLQ colostomy Dressing procedure/placement/frequency:Cleanse abdominal wound with NS and pat dry.  Fill wound with black foam.  Cover with drape.  Trim hair as needed. Change Mon/Wed/Fri.  Setting 80 mmHG.  [place] Nurse ostomy consult note Stoma type/location: LLQ colostomy Stomal assessment/size: 1 1/8 " round pink and moist  Producing brown soft stool Peristomal assessment: itnact Treatment options for stomal/peristomal skin:none.  Barrier perimeter trimmed to accomodate NPWT dressing.   Output softbrown stool Ostomy pouching:2pc. 2 1/4" pouch   Education provided: Patient has been emptying pouch at home.  Independent with care.  Enrolled patient in [place] program: Yes previous admission.  [place] team will follow.   Domenic Moras RN BSN [place] Pager [phone]

## 2017-01-24 NOTE — Progress Notes (Signed)
Anna [name]   DOB:[birth year]   ZH#:[id number]    Subjective: Patient resting comfortably. Denies any shortness of breath or chest pain. No fevers overnight. Complaints of mucositis discharge from the rectum. Otherwise no bleeding. Colostomy working well. She had to go to the bathroom multiple times last night. Objective:  Vitals:   01/23/17 2023 01/24/17 0417  BP: (!) 113/59 122/76  Pulse: (!) 133 (!) 113  Resp: 20 20  Temp: 99.4 F (37.4 C) 99.1 F (37.3 C)     Intake/Output Summary (Last 24 hours) at 01/24/17 0842 Last data filed at 01/24/17 0742  Gross per 24 hour  Intake           680.27 ml  Output           2412.5 ml  Net         -1732.23 ml    GENERAL:Thin  built moderately nourished; Alert, no distress and comfortable.She is alone.  EYES: no pallor or icterus OROPHARYNX: no thrush or ulceration. NECK: supple, no masses felt LYMPH:  no palpable lymphadenopathy in the cervical, axillary or inguinal regions LUNGS: decreased breath sounds to auscultation at bases and  No wheeze or crackles HEART/CVS: Tachycardia; regular rhythm and no murmurs; No lower extremity edema ABDOMEN: abdomen soft, non-tender and normal bowel sounds; positive for colostomy.  Musculoskeletal:no cyanosis of digits and no clubbing  PSYCH: alert & oriented x 3 with fluent speech NEURO: no focal motor/sensory deficits SKIN:  mild rash noted on face [improving]  Labs:  Lab Results  Component Value Date   WBC 15.9 (H) 01/24/2017   HGB 8.5 (L) 01/24/2017   HCT 26.0 (L) 01/24/2017   MCV 90.9 01/24/2017   PLT 653 (H) 01/24/2017   NEUTROABS 22.6 (H) 01/21/2017    Lab Results  Component Value Date   NA 135 01/23/2017   K 3.6 01/23/2017   CL 101 01/23/2017   CO2 28 01/23/2017    Studies:  Dg Chest 1 View  Result Date: 01/23/2017 CLINICAL DATA:  Pre V/Q scan examination. EXAM: CHEST 1 VIEW COMPARISON:  CT scan chest of Dec 30, 2016 FINDINGS: The lungs are adequately inflated. There is  subtle nodularity within both lungs with the largest nodule noted in the rib right upper lung measuring just under 2 cm in greatest dimension. There is no alveolar infiltrate. A trace of pleural fluid at the left lung base laterally is suspected. The heart is top-normal in size. The pulmonary vascularity is normal. There is soft tissue fullness in the right hilar region. The mediastinum is normal in width. IMPRESSION: Subtle pulmonary nodules bilaterally worrisome for metastatic disease. No discrete pneumonia. Small left pleural effusion. Probable right hilar lymphadenopathy. Electronically Signed   By: David  Martinique M.D.   On: 01/23/2017 12:44   Ct Angio Chest Pe W Or Wo Contrast  Result Date: 01/23/2017 CLINICAL DATA:  51 year old with recent diagnosis of metastatic adenocarcinoma of unknown primary, presenting with tachycardia and sepsis. Patient had colonic perforation with a large intra-abdominal abscess on 12/30/2016 for which she underwent colon resection. Ventilation-perfusion lung scan earlier today indicated high probability of pulmonary embolism. One view chest x-ray obtained earlier today demonstrated bilateral lung nodules. EXAM: CT ANGIOGRAPHY CHEST WITH CONTRAST TECHNIQUE: Multidetector CT imaging of the chest was performed using the standard protocol during bolus administration of intravenous contrast. Multiplanar CT image reconstructions and MIPs were obtained to evaluate the vascular anatomy. CONTRAST:  75 mL Isovue 370 IV. COMPARISON:  CT chest abdomen and pelvis 12/30/2016.  Chest X leg and nuclear medicine V/Q scan performed earlier today. FINDINGS: Cardiovascular: Contrast opacification of pulmonary arteries is very good. Filling defects within segmental branches of the right upper lobe and right middle lobe pulmonary arteries. Marked narrowing of the right middle lobe pulmonary artery due to surrounding nodal tissue. Central pulmonary arteries patent. No evidence of right heart strain.  Heart size upper normal. No pericardial effusion. No visible coronary atherosclerosis. No visible atherosclerosis involving the thoracic or upper abdominal aorta or their visualized branches. Mediastinum/Nodes: Right hilar lymphadenopathy which surrounds and compresses the right middle lobe pulmonary artery. No pathologic mediastinal, left hilar or axillary lymphadenopathy. Normal appearing esophagus. Thyroid gland normal in appearance. Lungs/Pleura: Numerous bilateral pulmonary nodules. Index right upper lobe nodule measures approximately 1.9 x 1.6 cm, mean 1.8 cm (series 7, image 29). Index lingular nodule adjacent to the major fissure measures approximately 1.7 x 2.2 cm, mean 2.0 cm (series 7, image 59). Peribronchial and possible lymphangitic spread of tumor in the right lung and left lower lobe. Small left pleural effusion. No right pleural effusion. Dense atelectasis dependently in the lower lobes. Upper Abdomen: Unremarkable for the early arterial phase of enhancement which accounts for the heterogeneous appearance of the spleen. Musculoskeletal: Regional skeleton intact without acute or significant osseous abnormality. Specifically, no evidence of osseous metastatic disease. Review of the MIP images confirms the above findings. IMPRESSION: 1. Acute pulmonary emboli involving segmental branches of the right upper lobe and right middle lobe pulmonary artery. There is no evidence of right heart strain. 2. Right hilar lymphadenopathy which surrounds and narrows the right middle lobe pulmonary artery. 3. Numerous bilateral pulmonary metastases. Index lesions in the right upper lobe and lingula are measured above. 4. Peribronchial and possible lymphangitic spread of tumor in the right lung and in the left lower lobe. 5. Small left pleural effusion. 6. Dependent dense atelectasis in the lower lobes. Electronically Signed   By: Evangeline Dakin M.D.   On: 01/23/2017 14:14   Nm Pulmonary Perf And Vent  Result  Date: 01/23/2017 CLINICAL DATA:  Tachycardia and sepsis.  Recent surgeries. EXAM: NUCLEAR MEDICINE VENTILATION - PERFUSION LUNG SCAN TECHNIQUE: Ventilation images were obtained in multiple projections using inhaled aerosol Tc-61mDTPA. Perfusion images were obtained in multiple projections after intravenous injection of Tc-947mAA. RADIOPHARMACEUTICALS:  34.2 MCi Technetium-9953mPA aerosol inhalation and 4.4 mCi Technetium-75m29m IV COMPARISON:  Chest radiograph from today FINDINGS: Ventilation: No focal ventilation defect. Perfusion: There are 2 large segmental perfusion defects involving the right upper and lower lobe. Within the superior segment of left lower lobe there is a moderate size segmental perfusion defect. IMPRESSION: High probability for acute pulmonary embolus. Electronically Signed   By: TaylKerby Moors.   On: 01/23/2017 12:22   Us VKoreaous Img Lower Bilateral  Result Date: 01/23/2017 CLINICAL DATA:  Acute pulmonary emboli.  Evaluate for source. EXAM: BILATERAL LOWER EXTREMITY VENOUS DOPPLER ULTRASOUND TECHNIQUE: Gray-scale sonography with graded compression, as well as color Doppler and duplex ultrasound were performed to evaluate the lower extremity deep venous systems from the level of the common femoral vein and including the common femoral, femoral, profunda femoral, popliteal and calf veins including the posterior tibial, peroneal and gastrocnemius veins when visible. The superficial great saphenous vein was also interrogated. Spectral Doppler was utilized to evaluate flow at rest and with distal augmentation maneuvers in the common femoral, femoral and popliteal veins. COMPARISON:  None. FINDINGS: RIGHT LOWER EXTREMITY Common Femoral Vein: No evidence of thrombus. Normal compressibility,  respiratory phasicity and response to augmentation. Saphenofemoral Junction: No evidence of thrombus. Normal compressibility and flow on color Doppler imaging. Profunda Femoral Vein: No evidence of  thrombus. Normal compressibility and flow on color Doppler imaging. Femoral Vein: No evidence of thrombus. Normal compressibility, respiratory phasicity and response to augmentation. Popliteal Vein: No evidence of thrombus. Normal compressibility, respiratory phasicity and response to augmentation. Calf Veins: No evidence of thrombus. Normal compressibility and flow on color Doppler imaging. Superficial Great Saphenous Vein: Not evaluated. Venous Reflux:  Not evaluated. Other Findings:  None. LEFT LOWER EXTREMITY Common Femoral Vein: No evidence of thrombus. Normal compressibility, respiratory phasicity and response to augmentation. Saphenofemoral Junction: No evidence of thrombus. Normal compressibility and flow on color Doppler imaging. Profunda Femoral Vein: No evidence of thrombus. Normal compressibility and flow on color Doppler imaging. Femoral Vein: No evidence of thrombus. Normal compressibility, respiratory phasicity and response to augmentation. Popliteal Vein: No evidence of thrombus. Normal compressibility, respiratory phasicity and response to augmentation. Calf Veins: Occlusive thrombus within one of the paired posterior tibial veins. The paired peroneal veins are patent. Superficial Great Saphenous Vein: Not evaluated. Venous Reflux:  Not evaluated. Other Findings: Left popliteal fossa cyst measuring approximately 4.6 x 1.2 x 1.9 cm. IMPRESSION: 1. Occlusive thrombus involving one of the paired left posterior tibial veins. 2. No evidence of DVT elsewhere in the left lower extremity. 3. No evidence of right lower extremity DVT. 4. Baker's cyst involving the left popliteal fossa, measured above. Electronically Signed   By: Evangeline Dakin M.D.   On: 01/23/2017 20:17    Assessment & Plan:   #  51 year old female patient with carcinoma of unknown primary; and recent pelvic abscess- is currently admitted to the hospital for fever/sepsis.  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 PDL-1 50%. awaiting second opinion consultation of the pathology at [facility]. [place] 1 workup.  Given the rapidly progressive malignancy -We'll plan to start immunotherapy [keytruda- orders are in] end of next week [ June 7th or 8th]/ once infection is better controlled. Poor candidate for chemotherapy given the active infection.   # Pelvic abscess status post- IR guided drain. Patient currently on meropenem [since having a reaction to cefepime]. Better tolerated. No rash overnight. Patient likely to be discharged on IV antibiotics thru PICC line. CRP elevated at 8/ also leucocytosis; improving.    # Acute PE- causing tachycardia. Currently on IV heparin/ plan transition to oral anticoagulation- like eliquis or xarelto today.   # discussed with patient in detail. Patient will be followed by one of my partners in my absence next week to start immunotherapy/keytruda.discussed with Dr.Vaickute.    Cammie Sickle, MD 01/24/2017  8:42 AM

## 2017-01-24 NOTE — Progress Notes (Signed)
ANTICOAGULATION CONSULT NOTE - Initial Consult  Pharmacy Consult for Heparin and apixaban  Indication: pulmonary embolus  Allergies  Allergen Reactions  . Cefepime Rash  . Penicillins Rash    As a child. Has patient had a PCN reaction causing immediate rash, facial/tongue/throat swelling, SOB or lightheadedness with hypotension: Unknown Has patient had a PCN reaction causing severe rash involving mucus membranes or skin necrosis: Unknown Has patient had a PCN reaction that required hospitalization: No Has patient had a PCN reaction occurring within the last 10 years: No If all of the above answers are "NO", then may proceed with Cephalosporin use.     Patient Measurements: Height: 5\' 3"  (160 cm) Weight: 121 lb 14.4 oz (55.3 kg) IBW/kg (Calculated) : 52.4  Vital Signs: Temp: 99.1 F (37.3 C) (06/01 0417) Temp Source: Oral (06/01 0417) BP: 122/76 (06/01 0417) Pulse Rate: 113 (06/01 0417)  Labs:  Recent Labs  01/21/17 1223 01/22/17 0411 01/22/17 1523 01/23/17 0706 01/23/17 1600 01/23/17 2237 01/24/17 0618  HGB 9.8* 10.0*  --  8.7*  --   --  8.5*  HCT 29.9* 30.8*  --  26.6*  --   --  26.0*  PLT 678* 746*  --  706*  --   --  653*  APTT  --   --   --   --  29  --   --   LABPROT 14.5  --   --   --  13.6  --   --   INR 1.12  --   --   --  1.04  --   --   HEPARINUNFRC  --   --   --   --   --  0.20* 0.30  CREATININE 0.46  --  0.58 0.48  --   --   --     Estimated Creatinine Clearance: 68 mL/min (by C-G formula based on SCr of 0.48 mg/dL).   Medical History: Past Medical History:  Diagnosis Date  . Cancer ([place])   . Cancer with unknown primary site [facility])   . Colon polyp   . Diverticulosis   . Fibroid uterus   . Heme positive stool 08/01/2014  . HH (hiatus hernia)   . Large bowel perforation ([place]) 12/31/2016  . Metastatic adenocarcinoma ([place]) 12/20/2016  . Pelvic pain in female 01/13/2017  . Perforation of sigmoid colon [facility])     Assessment: Pharmacy consulted for  heparin dosing and monitoring for PE in 51 yo female. Now consulted for transition from heparin to apixaban.   Goal of Therapy:  Heparin level 0.3-0.7 units/ml Monitor platelets by anticoagulation protocol: Yes   Plan:  5/31 22:30 heparin level 0.20. 800 unit bolus and increase rate to 1000 units/hr. Recheck with CBC in 6 hours.  6/1 0618 HL 0.3. Will continue current  Rate of 1000units/hr.  Will start Apixaban 10mg  twice daily x 7 days, followed by Apixaban 5mg  twice daily thereafter.  Heparin drip will be discontinued prior to apixaban being given.   Pernell Dupre, PharmD, BCPS Clinical Pharmacist 01/24/2017 7:56 AM

## 2017-01-26 LAB — CULTURE, BLOOD (ROUTINE X 2)
CULTURE: NO GROWTH
Culture: NO GROWTH
Special Requests: ADEQUATE

## 2017-01-27 ENCOUNTER — Telehealth: Payer: Self-pay

## 2017-01-27 ENCOUNTER — Encounter: Payer: Self-pay | Admitting: *Deleted

## 2017-01-27 NOTE — Progress Notes (Signed)
[facility] pathology request faxed per md orders. 01/27/17

## 2017-01-27 NOTE — Telephone Encounter (Signed)
Hospital Follow-up call made to patient at this time. Spoke with [facility]. Hospital Follow-up interview questions below.  1. How are you feeling? Feeling better prior to when she was admitted  2. Is your pain controlled? Yes  3. What are you doing for the pain? Taking pain medication as needed  4. Are you having any Nausea or Vomiting? None  5. Are you having any Fever or Chills? Had a little fever but it gradually went down  6. Are you having any Constipation or Diarrhea? Not having any  7. Do you have any questions or concerns at this time? Not at the moment   Discussion: Patient informed me that she was taking oxycodone 30 minutes prior to getting her wound vac changed. But stated that she didn't feel the need to take one when wound vac was changed at the hospital. She stated that home health was coming out today to change her wound vac and that she wasn't too sure if she was going to take the oxycodone before home health came out or not. I informed her that if she was able to handle the wound vac change while at the hospital without the oxycodone that maybe she should try not taking the oxycodone before. But if she was having pain after getting the wound vac changed and had wished that she took the oxycodone that she could take tylenol after the wound vac had been changed.   I also reminded her of her appointment on 6/7 with Dr. Dahlia Byes

## 2017-01-28 ENCOUNTER — Inpatient Hospital Stay: Payer: Managed Care, Other (non HMO) | Attending: Oncology

## 2017-01-28 DIAGNOSIS — A419 Sepsis, unspecified organism: Secondary | ICD-10-CM | POA: Insufficient documentation

## 2017-01-28 DIAGNOSIS — R590 Localized enlarged lymph nodes: Secondary | ICD-10-CM | POA: Insufficient documentation

## 2017-01-28 DIAGNOSIS — Z933 Colostomy status: Secondary | ICD-10-CM | POA: Insufficient documentation

## 2017-01-28 DIAGNOSIS — Z79899 Other long term (current) drug therapy: Secondary | ICD-10-CM | POA: Insufficient documentation

## 2017-01-28 DIAGNOSIS — Z86711 Personal history of pulmonary embolism: Secondary | ICD-10-CM | POA: Insufficient documentation

## 2017-01-28 DIAGNOSIS — C787 Secondary malignant neoplasm of liver and intrahepatic bile duct: Secondary | ICD-10-CM | POA: Insufficient documentation

## 2017-01-28 DIAGNOSIS — R5383 Other fatigue: Secondary | ICD-10-CM | POA: Insufficient documentation

## 2017-01-28 DIAGNOSIS — C7800 Secondary malignant neoplasm of unspecified lung: Secondary | ICD-10-CM | POA: Insufficient documentation

## 2017-01-28 DIAGNOSIS — Z800 Family history of malignant neoplasm of digestive organs: Secondary | ICD-10-CM | POA: Insufficient documentation

## 2017-01-28 DIAGNOSIS — C801 Malignant (primary) neoplasm, unspecified: Secondary | ICD-10-CM | POA: Insufficient documentation

## 2017-01-28 DIAGNOSIS — R531 Weakness: Secondary | ICD-10-CM | POA: Insufficient documentation

## 2017-01-28 DIAGNOSIS — F17210 Nicotine dependence, cigarettes, uncomplicated: Secondary | ICD-10-CM | POA: Insufficient documentation

## 2017-01-28 DIAGNOSIS — R509 Fever, unspecified: Secondary | ICD-10-CM | POA: Insufficient documentation

## 2017-01-28 DIAGNOSIS — J90 Pleural effusion, not elsewhere classified: Secondary | ICD-10-CM | POA: Insufficient documentation

## 2017-01-28 DIAGNOSIS — I2699 Other pulmonary embolism without acute cor pulmonale: Secondary | ICD-10-CM | POA: Insufficient documentation

## 2017-01-28 DIAGNOSIS — L0291 Cutaneous abscess, unspecified: Secondary | ICD-10-CM | POA: Insufficient documentation

## 2017-01-28 DIAGNOSIS — K449 Diaphragmatic hernia without obstruction or gangrene: Secondary | ICD-10-CM | POA: Insufficient documentation

## 2017-01-28 NOTE — Patient Instructions (Signed)
Pembrolizumab injection  What is this medicine?  PEMBROLIZUMAB (pem broe liz ue mab) is a monoclonal antibody. It is used to treat melanoma, head and neck cancer, Hodgkin lymphoma, non-small cell lung cancer, urothelial cancer, stomach cancer, and cancers that have a certain genetic condition.  This medicine may be used for other purposes; ask your health care provider or pharmacist if you have questions.  COMMON BRAND NAME(S): Keytruda  What should I tell my health care provider before I take this medicine?  They need to know if you have any of these conditions:  -diabetes  -immune system problems  -inflammatory bowel disease  -liver disease  -lung or breathing disease  -lupus  -organ transplant  -an unusual or allergic reaction to pembrolizumab, other medicines, foods, dyes, or preservatives  -pregnant or trying to get pregnant  -breast-feeding  How should I use this medicine?  This medicine is for infusion into a vein. It is given by a health care professional in a hospital or clinic setting.  A special MedGuide will be given to you before each treatment. Be sure to read this information carefully each time.  Talk to your pediatrician regarding the use of this medicine in children. While this drug may be prescribed for selected conditions, precautions do apply.  Overdosage: If you think you have taken too much of this medicine contact a poison control center or emergency room at once.  NOTE: This medicine is only for you. Do not share this medicine with others.  What if I miss a dose?  It is important not to miss your dose. Call your doctor or health care professional if you are unable to keep an appointment.  What may interact with this medicine?  Interactions have not been studied.  Give your health care provider a list of all the medicines, herbs, non-prescription drugs, or dietary supplements you use. Also tell them if you smoke, drink alcohol, or use illegal drugs. Some items may interact with your  medicine.  This list may not describe all possible interactions. Give your health care provider a list of all the medicines, herbs, non-prescription drugs, or dietary supplements you use. Also tell them if you smoke, drink alcohol, or use illegal drugs. Some items may interact with your medicine.  What should I watch for while using this medicine?  Your condition will be monitored carefully while you are receiving this medicine.  You may need blood work done while you are taking this medicine.  Do not become pregnant while taking this medicine or for 4 months after stopping it. Women should inform their doctor if they wish to become pregnant or think they might be pregnant. There is a potential for serious side effects to an unborn child. Talk to your health care professional or pharmacist for more information. Do not breast-feed an infant while taking this medicine or for 4 months after the last dose.  What side effects may I notice from receiving this medicine?  Side effects that you should report to your doctor or health care professional as soon as possible:  -allergic reactions like skin rash, itching or hives, swelling of the face, lips, or tongue  -bloody or black, tarry  -breathing problems  -changes in vision  -chest pain  -chills  -constipation  -cough  -dizziness or feeling faint or lightheaded  -fast or irregular heartbeat  -fever  -flushing  -hair loss  -low blood counts - this medicine may decrease the number of white blood cells, red blood cells   and platelets. You may be at increased risk for infections and bleeding.  -muscle pain  -muscle weakness  -persistent headache  -signs and symptoms of high blood sugar such as dizziness; dry mouth; dry skin; fruity breath; nausea; stomach pain; increased hunger or thirst; increased urination  -signs and symptoms of kidney injury like trouble passing urine or change in the amount of urine  -signs and symptoms of liver injury like dark urine, light-colored  stools, loss of appetite, nausea, right upper belly pain, yellowing of the eyes or skin  -stomach pain  -sweating  -weight loss  Side effects that usually do not require medical attention (report to your doctor or health care professional if they continue or are bothersome):  -decreased appetite  -diarrhea  -tiredness  This list may not describe all possible side effects. Call your doctor for medical advice about side effects. You may report side effects to FDA at 1-800-FDA-1088.  Where should I keep my medicine?  This drug is given in a hospital or clinic and will not be stored at home.  NOTE: This sheet is a summary. It may not cover all possible information. If you have questions about this medicine, talk to your doctor, pharmacist, or health care provider.   2018 Elsevier/Gold Standard (2016-05-21 12:29:36)

## 2017-01-29 ENCOUNTER — Inpatient Hospital Stay
Admission: EM | Admit: 2017-01-29 | Discharge: 2017-02-04 | DRG: 314 | Disposition: A | Discharge status: Home Health | Payer: Managed Care, Other (non HMO) | Attending: Internal Medicine | Admitting: Internal Medicine

## 2017-01-29 ENCOUNTER — Emergency Department: Payer: Managed Care, Other (non HMO)

## 2017-01-29 ENCOUNTER — Encounter: Payer: Self-pay | Admitting: Emergency Medicine

## 2017-01-29 ENCOUNTER — Telehealth: Payer: Self-pay | Admitting: Surgery

## 2017-01-29 ENCOUNTER — Other Ambulatory Visit: Payer: Self-pay

## 2017-01-29 DIAGNOSIS — C7989 Secondary malignant neoplasm of other specified sites: Secondary | ICD-10-CM | POA: Diagnosis present

## 2017-01-29 DIAGNOSIS — C785 Secondary malignant neoplasm of large intestine and rectum: Secondary | ICD-10-CM | POA: Diagnosis present

## 2017-01-29 DIAGNOSIS — C800 Disseminated malignant neoplasm, unspecified: Secondary | ICD-10-CM | POA: Diagnosis present

## 2017-01-29 DIAGNOSIS — I10 Essential (primary) hypertension: Secondary | ICD-10-CM | POA: Diagnosis present

## 2017-01-29 DIAGNOSIS — D630 Anemia in neoplastic disease: Secondary | ICD-10-CM | POA: Diagnosis present

## 2017-01-29 DIAGNOSIS — R509 Fever, unspecified: Secondary | ICD-10-CM | POA: Diagnosis present

## 2017-01-29 DIAGNOSIS — A4189 Other specified sepsis: Secondary | ICD-10-CM | POA: Diagnosis present

## 2017-01-29 DIAGNOSIS — Z9049 Acquired absence of other specified parts of digestive tract: Secondary | ICD-10-CM | POA: Diagnosis not present

## 2017-01-29 DIAGNOSIS — C786 Secondary malignant neoplasm of retroperitoneum and peritoneum: Secondary | ICD-10-CM | POA: Diagnosis present

## 2017-01-29 DIAGNOSIS — Z1501 Genetic susceptibility to malignant neoplasm of breast: Secondary | ICD-10-CM

## 2017-01-29 DIAGNOSIS — Z800 Family history of malignant neoplasm of digestive organs: Secondary | ICD-10-CM

## 2017-01-29 DIAGNOSIS — I472 Ventricular tachycardia: Secondary | ICD-10-CM | POA: Diagnosis not present

## 2017-01-29 DIAGNOSIS — Z880 Allergy status to penicillin: Secondary | ICD-10-CM

## 2017-01-29 DIAGNOSIS — R000 Tachycardia, unspecified: Secondary | ICD-10-CM | POA: Diagnosis not present

## 2017-01-29 DIAGNOSIS — E871 Hypo-osmolality and hyponatremia: Secondary | ICD-10-CM | POA: Diagnosis present

## 2017-01-29 DIAGNOSIS — D696 Thrombocytopenia, unspecified: Secondary | ICD-10-CM | POA: Diagnosis not present

## 2017-01-29 DIAGNOSIS — K651 Peritoneal abscess: Secondary | ICD-10-CM | POA: Diagnosis present

## 2017-01-29 DIAGNOSIS — K5790 Diverticulosis of intestine, part unspecified, without perforation or abscess without bleeding: Secondary | ICD-10-CM | POA: Diagnosis present

## 2017-01-29 DIAGNOSIS — Z7901 Long term (current) use of anticoagulants: Secondary | ICD-10-CM

## 2017-01-29 DIAGNOSIS — E876 Hypokalemia: Secondary | ICD-10-CM | POA: Diagnosis present

## 2017-01-29 DIAGNOSIS — K219 Gastro-esophageal reflux disease without esophagitis: Secondary | ICD-10-CM | POA: Diagnosis present

## 2017-01-29 DIAGNOSIS — Z933 Colostomy status: Secondary | ICD-10-CM

## 2017-01-29 DIAGNOSIS — R918 Other nonspecific abnormal finding of lung field: Secondary | ICD-10-CM | POA: Diagnosis not present

## 2017-01-29 DIAGNOSIS — A419 Sepsis, unspecified organism: Secondary | ICD-10-CM | POA: Diagnosis not present

## 2017-01-29 DIAGNOSIS — D259 Leiomyoma of uterus, unspecified: Secondary | ICD-10-CM | POA: Diagnosis present

## 2017-01-29 DIAGNOSIS — C787 Secondary malignant neoplasm of liver and intrahepatic bile duct: Secondary | ICD-10-CM | POA: Diagnosis present

## 2017-01-29 DIAGNOSIS — Z86711 Personal history of pulmonary embolism: Secondary | ICD-10-CM | POA: Diagnosis not present

## 2017-01-29 DIAGNOSIS — N739 Female pelvic inflammatory disease, unspecified: Secondary | ICD-10-CM

## 2017-01-29 DIAGNOSIS — T80211A Bloodstream infection due to central venous catheter, initial encounter: Secondary | ICD-10-CM | POA: Diagnosis present

## 2017-01-29 DIAGNOSIS — Z5329 Procedure and treatment not carried out because of patient's decision for other reasons: Secondary | ICD-10-CM | POA: Diagnosis present

## 2017-01-29 DIAGNOSIS — K769 Liver disease, unspecified: Secondary | ICD-10-CM | POA: Diagnosis not present

## 2017-01-29 DIAGNOSIS — C7982 Secondary malignant neoplasm of genital organs: Secondary | ICD-10-CM | POA: Diagnosis present

## 2017-01-29 DIAGNOSIS — Z8719 Personal history of other diseases of the digestive system: Secondary | ICD-10-CM | POA: Diagnosis not present

## 2017-01-29 DIAGNOSIS — Z86718 Personal history of other venous thrombosis and embolism: Secondary | ICD-10-CM

## 2017-01-29 DIAGNOSIS — Z881 Allergy status to other antibiotic agents status: Secondary | ICD-10-CM

## 2017-01-29 DIAGNOSIS — C7962 Secondary malignant neoplasm of left ovary: Secondary | ICD-10-CM | POA: Diagnosis present

## 2017-01-29 DIAGNOSIS — Z86010 Personal history of colonic polyps: Secondary | ICD-10-CM | POA: Diagnosis not present

## 2017-01-29 DIAGNOSIS — D649 Anemia, unspecified: Secondary | ICD-10-CM | POA: Diagnosis not present

## 2017-01-29 DIAGNOSIS — Y848 Other medical procedures as the cause of abnormal reaction of the patient, or of later complication, without mention of misadventure at the time of the procedure: Secondary | ICD-10-CM | POA: Diagnosis present

## 2017-01-29 DIAGNOSIS — C801 Malignant (primary) neoplasm, unspecified: Secondary | ICD-10-CM | POA: Diagnosis not present

## 2017-01-29 DIAGNOSIS — D72829 Elevated white blood cell count, unspecified: Secondary | ICD-10-CM | POA: Diagnosis not present

## 2017-01-29 DIAGNOSIS — R5383 Other fatigue: Secondary | ICD-10-CM | POA: Diagnosis not present

## 2017-01-29 LAB — CBC WITH DIFFERENTIAL/PLATELET
BASOS ABS: 0 10*3/uL (ref 0–0.1)
Basophils Relative: 0 %
Eosinophils Absolute: 0.4 10*3/uL (ref 0–0.7)
Eosinophils Relative: 2 %
HCT: 26.7 % — ABNORMAL LOW (ref 35.0–47.0)
HEMOGLOBIN: 8.7 g/dL — AB (ref 12.0–16.0)
LYMPHS PCT: 4 %
Lymphs Abs: 0.8 10*3/uL — ABNORMAL LOW (ref 1.0–3.6)
MCH: 28.7 pg (ref 26.0–34.0)
MCHC: 32.7 g/dL (ref 32.0–36.0)
MCV: 87.8 fL (ref 80.0–100.0)
Monocytes Absolute: 0.8 10*3/uL (ref 0.2–0.9)
Monocytes Relative: 4 %
NEUTROS PCT: 90 %
Neutro Abs: 17.3 10*3/uL — ABNORMAL HIGH (ref 1.4–6.5)
PLATELETS: 627 10*3/uL — AB (ref 150–440)
RBC: 3.04 MIL/uL — AB (ref 3.80–5.20)
RDW: 16.5 % — ABNORMAL HIGH (ref 11.5–14.5)
WBC: 19.4 10*3/uL — AB (ref 3.6–11.0)

## 2017-01-29 LAB — COMPREHENSIVE METABOLIC PANEL
ALT: 62 U/L — AB (ref 14–54)
ANION GAP: 9 (ref 5–15)
AST: 78 U/L — ABNORMAL HIGH (ref 15–41)
Albumin: 2.5 g/dL — ABNORMAL LOW (ref 3.5–5.0)
Alkaline Phosphatase: 90 U/L (ref 38–126)
BUN: 11 mg/dL (ref 6–20)
CHLORIDE: 93 mmol/L — AB (ref 101–111)
CO2: 30 mmol/L (ref 22–32)
Calcium: 8 mg/dL — ABNORMAL LOW (ref 8.9–10.3)
Creatinine, Ser: 0.38 mg/dL — ABNORMAL LOW (ref 0.44–1.00)
Glucose, Bld: 96 mg/dL (ref 65–99)
POTASSIUM: 3.1 mmol/L — AB (ref 3.5–5.1)
SODIUM: 132 mmol/L — AB (ref 135–145)
Total Bilirubin: 0.7 mg/dL (ref 0.3–1.2)
Total Protein: 6.1 g/dL — ABNORMAL LOW (ref 6.5–8.1)

## 2017-01-29 LAB — PROTIME-INR
INR: 1.48
PROTHROMBIN TIME: 18.1 s — AB (ref 11.4–15.2)

## 2017-01-29 LAB — LACTIC ACID, PLASMA: LACTIC ACID, VENOUS: 1 mmol/L (ref 0.5–1.9)

## 2017-01-29 MED ORDER — IOPAMIDOL (ISOVUE-370) INJECTION 76%
100.0000 mL | Freq: Once | INTRAVENOUS | Status: AC | PRN
  Administered 2017-01-29: 100 mL via INTRAVENOUS

## 2017-01-29 MED ORDER — SODIUM CHLORIDE 0.9 % IV BOLUS (SEPSIS)
1000.0000 mL | Freq: Once | INTRAVENOUS | Status: AC
  Administered 2017-01-29: 1000 mL via INTRAVENOUS

## 2017-01-29 MED ORDER — VANCOMYCIN HCL IN DEXTROSE 1-5 GM/200ML-% IV SOLN
1000.0000 mg | Freq: Once | INTRAVENOUS | Status: AC
  Administered 2017-01-29: 1000 mg via INTRAVENOUS
  Filled 2017-01-29: qty 200

## 2017-01-29 MED ORDER — INSULIN ASPART 100 UNIT/ML ~~LOC~~ SOLN
SUBCUTANEOUS | Status: AC
  Filled 2017-01-29: qty 10

## 2017-01-29 MED ORDER — DEXTROSE 50 % IV SOLN
INTRAVENOUS | Status: AC
  Filled 2017-01-29: qty 50

## 2017-01-29 MED ORDER — SODIUM BICARBONATE 8.4 % IV SOLN
INTRAVENOUS | Status: AC
  Filled 2017-01-29: qty 50

## 2017-01-29 NOTE — ED Notes (Signed)
Patient is tearful. She is discouraged she has been in and out of hospital with surgery. Patient states she has missed several appointments with the surgeon due to being admitted to the hospital. Patient has appointment tomorrow at cancer center for her first infusion.

## 2017-01-29 NOTE — ED Notes (Signed)
Patient ambulated to bathroom. Patient is steady on feet.

## 2017-01-29 NOTE — H&P (Signed)
History and Physical   [facility] - [place] @ [facility] Admission History and Physical [employer], D.O.    Patient Name: Anna [name] MR#: [id number] Date of Birth: 27-May-1966 Date of Admission: 01/29/2017  Referring MD/NP/PA: Dr. Alfred Levins Primary Care Physician: McLean-Scocuzza, Nino Glow, MD Patient coming from: Home   Chief Complaint:  Chief Complaint  Patient presents with  . Fever    HPI: Anna [name] is a 51 y.o. female with a known history of recent diagnosis of adenocarcinoma Of unknown primary with metastases presents to the emergency department for evaluation of fever and tachycardia.    Patient has had a long and complicated recent history. In April 2018 she underwent surgery for an ovarian cyst and was subsequently diagnosed with adenocarcinoma, unknown primary. She then underwent partial colectomy with colostomy and Hartman's pouch for sigmoid perforation. She has been treated for wound infection and currently has a wound VAC. She was hospitalized for sepsis secondary to intra-abdominal abscesses and was discharged 5 days ago on Merrem 3 times a day via PICC line. Of note that hospitalization was also complicated by PE for which she is on liquids Her home health nurse today came to change her wound VAC and found her to have a low-grade fever of 100.1 and tachycardia. In addition to her PICC line she also has a wound VAC and 2 JP drains. Patient denies any acute symptoms or any change in status other than generalized weakness.  Patient denies fevers/chills, weakness, dizziness, chest pain, shortness of breath, N/V/C/D, abdominal pain, dysuria/frequency, changes in mental status.    Review of Systems:  CONSTITUTIONAL: Positive generalized weakness. No fever/chills, fatigue, weight gain/loss, headache. EYES: No blurry or double vision. ENT: No tinnitus, postnasal drip, redness or soreness of the oropharynx. RESPIRATORY: No cough, dyspnea, wheeze.  No hemoptysis.   CARDIOVASCULAR: No chest pain, palpitations, syncope, orthopnea. No lower extremity edema.  GASTROINTESTINAL: No nausea, vomiting, abdominal pain, diarrhea, constipation.  No hematemesis, melena or hematochezia. GENITOURINARY: No dysuria, frequency, hematuria. ENDOCRINE: No polyuria or nocturia. No heat or cold intolerance. HEMATOLOGY: No anemia, bruising, bleeding. INTEGUMENTARY: No rashes, ulcers, lesions. MUSCULOSKELETAL: No arthritis, gout, dyspnea. NEUROLOGIC: No numbness, tingling, ataxia, seizure-type activity, weakness. PSYCHIATRIC: No anxiety, depression, insomnia.   Past Medical History:  Diagnosis Date  . Cancer ([place])   . Cancer with unknown primary site [facility])   . Colon polyp   . Diverticulosis   . Fibroid uterus   . Heme positive stool 08/01/2014  . HH (hiatus hernia)   . Large bowel perforation ([place]) 12/31/2016  . Metastatic adenocarcinoma ([place]) 12/20/2016  . Pelvic pain in female 01/13/2017  . Perforation of sigmoid colon [facility])     Past Surgical History:  Procedure Laterality Date  . COLONOSCOPY    . CYSTOSCOPY  12/20/2016   Procedure: CYSTOSCOPY;  Surgeon: Boykin Nearing, MD;  Location: [facility] ORS;  Service: Gynecology;;  . ESOPHAGOGASTRODUODENOSCOPY    . LAPAROSCOPIC BILATERAL SALPINGECTOMY Bilateral 12/20/2016   Procedure: LAPAROSCOPIC BILATERAL SALPINGOOPHERECTOMY,  Excision of anterior 5 cm calcified uterine fibroid;  Surgeon: Boykin Nearing, MD;  Location: [facility] ORS;  Service: Gynecology;  Laterality: Bilateral;  . LAPAROSCOPIC LYSIS OF ADHESIONS  12/20/2016   Procedure: LAPAROSCOPIC LYSIS OF ADHESIONS WITH DISSECTION OF LEFT TUBE AND OVARY.;  Surgeon: Olean Ree, MD;  Location: [facility] ORS;  Service: General;;  . LAPAROTOMY N/A 12/31/2016   Procedure: EXPLORATORY LAPAROTOMY, sigmoid colectomy, removal of omentum, colostomy;  Surgeon: Jules Husbands, MD;  Location: [facility] ORS;  Service:  General;  Laterality: N/A;  . SIGMOIDOSCOPY  12/20/2016   Procedure:  RIGID SIGMOIDOSCOPY;  Surgeon: Olean Ree, MD;  Location: [facility] ORS;  Service: General;;  . TONSILLECTOMY       reports that she has never smoked. She has never used smokeless tobacco. She reports that she does not drink alcohol or use drugs.  Allergies  Allergen Reactions  . Cefepime Rash  . Penicillins Rash    As a child. Has patient had a PCN reaction causing immediate rash, facial/tongue/throat swelling, SOB or lightheadedness with hypotension: Unknown Has patient had a PCN reaction causing severe rash involving mucus membranes or skin necrosis: Unknown Has patient had a PCN reaction that required hospitalization: No Has patient had a PCN reaction occurring within the last 10 years: No If all of the above answers are "NO", then may proceed with Cephalosporin use.     Family History  Problem Relation Age of Onset  . Colon cancer Mother 19  . Diabetes Mother   . Asthma Father   . Breast cancer Neg Hx     Prior to Admission medications   Medication Sig Start Date End Date Taking? Authorizing Provider  acetaminophen (TYLENOL) 500 MG tablet Take 500 mg by mouth as needed for mild pain.    [provider]  apixaban (ELIQUIS) 5 MG TABS tablet Take 1 tablet (5 mg total) by mouth 2 (two) times daily. Please take 2 pills orally twice a day for 7 days as a loading dose, then continue 1 pill twice a day indefinitely, thank you Patient taking differently: Take 5-10 mg by mouth 2 (two) times daily. take 2 pills orally twice a day for 7 days as a loading dose, then continue 1 pill twice a day indefinitely 01/31/17   Theodoro Grist, MD  diphenhydrAMINE (BENADRYL) 25 mg capsule Take 1 capsule (25 mg total) by mouth every 6 (six) hours as needed (Rash). 01/23/17   Theodoro Grist, MD  estradiol (VIVELLE-DOT) 0.025 MG/24HR Place 1 patch onto the skin 2 (two) times a week.    [provider]  meropenem 1 g in sodium chloride 0.9 % 100 mL Inject 1 g into the vein every 8 (eight)  hours. 01/23/17   Theodoro Grist, MD  metoprolol succinate (TOPROL-XL) 50 MG 24 hr tablet Take 1 tablet by mouth once. 01/21/17   [provider]  oxyCODONE-acetaminophen (ROXICET) 5-325 MG tablet Take 1 tablet by mouth every 6 (six) hours as needed. 01/10/17 01/10/18  Florene Glen, MD    Physical Exam: Vitals:   01/29/17 1702 01/29/17 1703 01/29/17 1900  BP: 130/78  133/87  Pulse: (!) 135    Resp: 18  (!) 25  Temp: 100.1 F (37.8 C)    TempSrc: Oral    SpO2: 97%    Weight:  54.9 kg (121 lb)   Height:  5\' 3"  (1.6 m)     GENERAL: 51 y.o.-year-old White female patient, well-developed, well-nourished lying in the bed in no acute distress.  Pleasant and cooperative.   HEENT: Head atraumatic, normocephalic. Pupils equal, round, reactive to light and accommodation. No scleral icterus. Extraocular muscles intact. Nares are patent. Oropharynx is clear. Mucus membranes moist. NECK: Supple, full range of motion.  CHEST: Normal breath sounds bilaterally. No wheezing, rales, rhonchi or crackles. No use of accessory muscles of respiration.  No reproducible chest wall tenderness.  CARDIOVASCULAR: S1, S2 normal. No murmurs, rubs, or gallops. Cap refill <2 seconds. Pulses intact distally.  ABDOMEN: Soft, nondistended, nontender. Colostomy  in place and draining soft brown stool. There are 2 JP drains with minimal yellow fluid, no surrounding erythema. Wound VAC is also in place without any drainage, edema or erythema. EXTREMITIES: There is a PICC line in the left upper extremity without any surrounding edema or erythema. No pedal edema, cyanosis, or clubbing. No calf tenderness or Homan's sign.  NEUROLOGIC: The patient is alert and oriented x 3. Cranial nerves II through XII are grossly intact with no focal sensorimotor deficit. Muscle strength 5/5 in all extremities. Sensation intact. Gait not checked. PSYCHIATRIC:  Normal affect, mood, thought content. SKIN: Warm, dry, and intact without  obvious rash, lesion, or ulcer.    Labs on Admission:  CBC:  Recent Labs Lab 01/23/17 0706 01/24/17 0618 01/29/17 1714  WBC 20.5* 15.9* 19.4*  NEUTROABS  --   --  17.3*  HGB 8.7* 8.5* 8.7*  HCT 26.6* 26.0* 26.7*  MCV 92.4 90.9 87.8  PLT 706* 653* 259*   Basic Metabolic Panel:  Recent Labs Lab 01/23/17 0706 01/29/17 1714  NA 135 132*  K 3.6 3.1*  CL 101 93*  CO2 28 30  GLUCOSE 104* 96  BUN 6 11  CREATININE 0.48 0.38*  CALCIUM 7.4* 8.0*  MG 2.5*  --    GFR: Estimated Creatinine Clearance: 68 mL/min (A) (by C-G formula based on SCr of 0.38 mg/dL (L)). Liver Function Tests:  Recent Labs Lab 01/29/17 1714  AST 78*  ALT 62*  ALKPHOS 90  BILITOT 0.7  PROT 6.1*  ALBUMIN 2.5*   No results for input(s): LIPASE, AMYLASE in the last 168 hours. No results for input(s): AMMONIA in the last 168 hours. Coagulation Profile:  Recent Labs Lab 01/23/17 1600 01/29/17 1714  INR 1.04 1.48   Cardiac Enzymes: No results for input(s): CKTOTAL, CKMB, CKMBINDEX, TROPONINI in the last 168 hours. BNP (last 3 results) No results for input(s): PROBNP in the last 8760 hours. HbA1C: No results for input(s): HGBA1C in the last 72 hours. CBG: No results for input(s): GLUCAP in the last 168 hours. Lipid Profile: No results for input(s): CHOL, HDL, LDLCALC, TRIG, CHOLHDL, LDLDIRECT in the last 72 hours. Thyroid Function Tests: No results for input(s): TSH, T4TOTAL, FREET4, T3FREE, THYROIDAB in the last 72 hours. Anemia Panel: No results for input(s): VITAMINB12, FOLATE, FERRITIN, TIBC, IRON, RETICCTPCT in the last 72 hours. Urine analysis:    Component Value Date/Time   COLORURINE YELLOW (A) 01/21/2017 1257   APPEARANCEUR CLEAR (A) 01/21/2017 1257   LABSPEC 1.012 01/21/2017 1257   PHURINE 6.0 01/21/2017 1257   GLUCOSEU NEGATIVE 01/21/2017 1257   HGBUR NEGATIVE 01/21/2017 1257   [place] 01/21/2017 1257   KETONESUR 20 (A) 01/21/2017 1257   PROTEINUR NEGATIVE  01/21/2017 1257   NITRITE NEGATIVE 01/21/2017 1257   LEUKOCYTESUR TRACE (A) 01/21/2017 1257   Sepsis Labs: @LABRCNTIP (procalcitonin:4,lacticidven:4) ) Recent Results (from the past 240 hour(s))  Urine culture     Status: None   Collection Time: 01/21/17 12:13 PM  Result Value Ref Range Status   Specimen Description URINE, RANDOM  Final   Special Requests NONE  Final   Culture   Final    NO GROWTH Performed at [facility], [place] [address], [place], [place] [address]    Report Status 01/22/2017 FINAL  Final  Culture, blood (Routine x 2)     Status: None   Collection Time: 01/21/17 12:23 PM  Result Value Ref Range Status   Specimen Description BLOOD LEFT FOREARM  Final  Special Requests BOTTLES DRAWN AEROBIC AND ANAEROBIC BCAV  Final   Culture NO GROWTH 5 DAYS  Final   Report Status 01/26/2017 FINAL  Final  Culture, blood (Routine x 2)     Status: None   Collection Time: 01/21/17 12:36 PM  Result Value Ref Range Status   Specimen Description BLOOD RIGHT HAND  Final   Special Requests   Final    BOTTLES DRAWN AEROBIC AND ANAEROBIC Blood Culture adequate volume   Culture NO GROWTH 5 DAYS  Final   Report Status 01/26/2017 FINAL  Final     Radiological Exams on Admission: Dg Chest 2 View  Result Date: 01/29/2017 CLINICAL DATA:  51 year old with metastatic colon cancer, presenting with fever. Current history of pulmonary emboli. EXAM: CHEST  2 VIEW COMPARISON:  CTA chest 01/23/2017.  Chest x-ray 01/23/2017. FINDINGS: AP semi-erect and lateral images were obtained. Cardiomediastinal silhouette unremarkable, unchanged. Multiple bilateral lung nodules as identified on the 01/23/2017 examinations. Mild atelectasis in the lower lobes as noted on the prior CT. No new pulmonary parenchymal abnormalities. No visible pleural effusions. Left arm PICC tip in the lower SVC. IMPRESSION: 1. Stable atelectasis involving the lower lobes since the CT 01/23/2017. 2. Multiple bilateral pulmonary  metastases as noted on the CT. 3. No new/acute cardiopulmonary abnormalities. Electronically Signed   By: Evangeline Dakin M.D.   On: 01/29/2017 18:17   Ct Angio Chest Pe W Or Wo Contrast  Result Date: 01/29/2017 CLINICAL DATA:  Fever, tachycardia. EXAM: CT ANGIOGRAPHY CHEST CT ABDOMEN AND PELVIS WITH CONTRAST TECHNIQUE: Multidetector CT imaging of the chest was performed using the standard protocol during bolus administration of intravenous contrast. Multiplanar CT image reconstructions and MIPs were obtained to evaluate the vascular anatomy. Multidetector CT imaging of the abdomen and pelvis was performed using the standard protocol during bolus administration of intravenous contrast. CONTRAST:  100 mL of Isovue 370 intravenously. COMPARISON:  CT scan of Jan 21, 2017. FINDINGS: CTA CHEST FINDINGS Cardiovascular: Satisfactory opacification of the pulmonary arteries to the segmental level. No evidence of pulmonary embolism. Normal heart size. No pericardial effusion. Mediastinum/Nodes: No enlarged mediastinal, hilar, or axillary lymph nodes. Thyroid gland, trachea, and esophagus demonstrate no significant findings. Lungs/Pleura: Multiple nodular densities are noted throughout both lungs consistent with metastatic disease. Minimal bibasilar subsegmental atelectasis is noted. No pneumothorax or pleural effusion is noted. Musculoskeletal: No chest wall abnormality. No acute or significant osseous findings. Review of the MIP images confirms the above findings. CT ABDOMEN and PELVIS FINDINGS Hepatobiliary: The gallstones are noted. Stable hepatic lesions are noted consistent with metastatic disease. Pancreas: Unremarkable. No pancreatic ductal dilatation or surrounding inflammatory changes. Spleen: Normal in size without focal abnormality. Adrenals/Urinary Tract: Adrenal glands appear normal. No hydronephrosis or renal obstruction is noted. No renal or ureteral calculi are noted. Urinary bladder is unremarkable.  Stable complex low density is noted posteriorly in the left renal cortex concerning for infection, infarction or possibly metastatic disease. Stomach/Bowel: There is no evidence of bowel obstruction. 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 Vascular/Lymphatic: No significant vascular abnormality is noted. Left periaortic adenopathy is noted consistent with metastatic disease. The largest measures 15 mm. Reproductive: Reportedly, both ovaries have been removed. Probable fibroid uterus is noted. Continued presence of inflammatory changes seen around the uterus in the pelvis with multiple small fluid collections, the largest measuring 33 x 21 mm. These do not appear to be significantly changed compared to prior exam. Other: Surgical drain remains in the subhepatic space. No fluid collection  is noted around this drain. Continued and stable presence of pigtail drainage catheter seen in the right lower quadrant with no significant fluid collection noted around it. Midline surgical incision is noted. Musculoskeletal: No acute or significant osseous findings. Review of the MIP images confirms the above findings. IMPRESSION: No definite evidence of pulmonary embolus. Continued presence of multiple pulmonary and hepatic metastatic lesions. Stable complex low density seen posteriorly in left renal cortex concerning for infection, infarction or possibly metastatic lesion. Left periaortic adenopathy is noted with largest lymph node measuring 15 mm. This is consistent with metastatic disease. Continued presence of surgical drain seen in subhepatic space and pigtail drainage catheter in the right lower quadrant, with no significant fluid collection is noted around either catheter. Continued presence of inflammatory changes in the pelvis adjacent to the fibroid uterus, with multiple small complex fluid collections which are not significantly changed compared to prior exam. Electronically Signed   By: Marijo Conception, M.D.    On: 01/29/2017 20:38   Ct Abdomen Pelvis W Contrast  Result Date: 01/29/2017 CLINICAL DATA:  Fever, tachycardia. EXAM: CT ANGIOGRAPHY CHEST CT ABDOMEN AND PELVIS WITH CONTRAST TECHNIQUE: Multidetector CT imaging of the chest was performed using the standard protocol during bolus administration of intravenous contrast. Multiplanar CT image reconstructions and MIPs were obtained to evaluate the vascular anatomy. Multidetector CT imaging of the abdomen and pelvis was performed using the standard protocol during bolus administration of intravenous contrast. CONTRAST:  100 mL of Isovue 370 intravenously. COMPARISON:  CT scan of Jan 21, 2017. FINDINGS: CTA CHEST FINDINGS Cardiovascular: Satisfactory opacification of the pulmonary arteries to the segmental level. No evidence of pulmonary embolism. Normal heart size. No pericardial effusion. Mediastinum/Nodes: No enlarged mediastinal, hilar, or axillary lymph nodes. Thyroid gland, trachea, and esophagus demonstrate no significant findings. Lungs/Pleura: Multiple nodular densities are noted throughout both lungs consistent with metastatic disease. Minimal bibasilar subsegmental atelectasis is noted. No pneumothorax or pleural effusion is noted. Musculoskeletal: No chest wall abnormality. No acute or significant osseous findings. Review of the MIP images confirms the above findings. CT ABDOMEN and PELVIS FINDINGS Hepatobiliary: The gallstones are noted. Stable hepatic lesions are noted consistent with metastatic disease. Pancreas: Unremarkable. No pancreatic ductal dilatation or surrounding inflammatory changes. Spleen: Normal in size without focal abnormality. Adrenals/Urinary Tract: Adrenal glands appear normal. No hydronephrosis or renal obstruction is noted. No renal or ureteral calculi are noted. Urinary bladder is unremarkable. Stable complex low density is noted posteriorly in the left renal cortex concerning for infection, infarction or possibly metastatic disease.  Stomach/Bowel: There is no evidence of bowel obstruction. 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 Vascular/Lymphatic: No significant vascular abnormality is noted. Left periaortic adenopathy is noted consistent with metastatic disease. The largest measures 15 mm. Reproductive: Reportedly, both ovaries have been removed. Probable fibroid uterus is noted. Continued presence of inflammatory changes seen around the uterus in the pelvis with multiple small fluid collections, the largest measuring 33 x 21 mm. These do not appear to be significantly changed compared to prior exam. Other: Surgical drain remains in the subhepatic space. No fluid collection is noted around this drain. Continued and stable presence of pigtail drainage catheter seen in the right lower quadrant with no significant fluid collection noted around it. Midline surgical incision is noted. Musculoskeletal: No acute or significant osseous findings. Review of the MIP images confirms the above findings. IMPRESSION: No definite evidence of pulmonary embolus. Continued presence of multiple pulmonary and hepatic metastatic lesions. Stable complex low  density seen posteriorly in left renal cortex concerning for infection, infarction or possibly metastatic lesion. Left periaortic adenopathy is noted with largest lymph node measuring 15 mm. This is consistent with metastatic disease. Continued presence of surgical drain seen in subhepatic space and pigtail drainage catheter in the right lower quadrant, with no significant fluid collection is noted around either catheter. Continued presence of inflammatory changes in the pelvis adjacent to the fibroid uterus, with multiple small complex fluid collections which are not significantly changed compared to prior exam. Electronically Signed   By: Marijo Conception, M.D.   On: 01/29/2017 20:38    EKG: Sinus tachycardia at 122 bpm with normal axis, inferior T-wave inversions and nonspecific ST-T wave changes.    Assessment/Plan  This is a 51 y.o. female with a history of metastatic adenocarcinoma, wound infection, PE,   now being admitted with:  #. Sepsis secondary to abdominal abscess although she has multiple other possible sources as well including PICC line, JP drains, wound VAC - Admit to inpatient with telemetry monitoring - IV antibiotics: Merrem and Vanco - IV fluid hydration - Follow up blood,urine & sputum cultures - Repeat CBC in am.  - Infectious disease consultation has been requested: abscesses, wound infection, PICC line - Surgery consultation has been requested. Advised antibiotics only.   #. Electrolyte derangements Hyponatremia, hypokalemia - Replace as needed - Check Mg, Phos - IVFs  #. History of PE - Eliquis for now as patient is not a surgical candidate  #. History of HTN - Continue metoprolol   Admission status: Inpatient, telemetry IV Fluids: NS Diet/Nutrition: Clear liquids Consults called: ID  DVT Px: Eliquis, SCDs and early ambulation. Code Status: Full Code  Disposition Plan: To home in 2-3 days  All the records are reviewed and case discussed with ED provider. Management plans discussed with the patient and/or family who express understanding and agree with plan of care.  Anna [name] D.O. on 01/29/2017 at 9:34 PM Between 7am to 6pm - Pager - [phone] After 6pm go to [email or web address] - [employer] [facility]  [facility] Office [phone] CC: Primary care physician; McLean-Scocuzza, Nino Glow, MD   01/29/2017, 9:34 PM

## 2017-01-29 NOTE — Telephone Encounter (Signed)
Home health nurse, Amy [place] has called. She seen patient today. Patient had a temperature of 99.8 with a heart rate of 120-128. Patient was diagnosed with Pulmonary embolism last week. She states that her heart rate returned to normal when she was in the hospital however it has increased. Patient is not short of breath at this time.   Please contact Amy Pope at [phone].

## 2017-01-29 NOTE — ED Notes (Signed)
Pt transferred to room 106C

## 2017-01-29 NOTE — ED Notes (Signed)
Patient has wound vac on her abdomen. This RN has moved respiratory lead around to get an accurate reading. Still unable to place it in a spot that does not get artifact from wound vac. This RN counted her respirations. Patient is not tachypeanic, NAD. Patient resting in bed.

## 2017-01-29 NOTE — ED Notes (Signed)
Patient has multiple things going on. She has a PICC line to the left upper arm. She has a colostomy bag in place. She has a wound vac to the center of her lower abdomen. She has a JP drain to the right of her wound vac. Patients respiratory lead is near the wound vac. Patients respirations are not true on the monitor. Patient is tachy. Denies any pain. Patient had antibiotics at home via PICC line at 1500.

## 2017-01-29 NOTE — ED Notes (Signed)
Patient ambulated to bathroom.

## 2017-01-29 NOTE — ED Provider Notes (Signed)
[facility] Emergency Department Provider Note  ____________________________________________  Time seen: Approximately 5:53 PM  I have reviewed the triage vital signs and the nursing notes.   HISTORY  Chief Complaint Fever   HPI [place] Welcome is a 51 y.o. 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 Patient was discharged from the hospital 5 days ago after being admitted for sepsis due to intra-abdominal abscesses. She is currently on meropenem TID via a PICC line with last dose at 3:45 PM today. Home health nurse went to the house today to change her wound VAC which seems to be well healing with no complications and noted that patient's heart rate is elevated and she also had a low-grade fever of 100.34F. the patient's last admission she was diagnosed with PEs and has been on Eliquis since discharge. Patient reports that her heart rate has been in the 120s at home since she was discharged which she attributed it to a lower dose metoprolol after she went home. Patient denies cough or congestion, sore throat, headache, neck stiffness, chest pain or shortness of breath, abdominal pain, nausea, vomiting, diarrhea, dysuria. She has had normal output in her ostomy. She has had small output from JP drains which has been unchanged with no evidence of pus.  Past Medical History:  Diagnosis Date  . Cancer ([place])   . Cancer with unknown primary site [facility])   . Colon polyp   . Diverticulosis   . Fibroid uterus   . Heme positive stool 08/01/2014  . HH (hiatus hernia)   . Large bowel perforation ([place]) 12/31/2016  . Metastatic adenocarcinoma ([place]) 12/20/2016  . Pelvic pain in female 01/13/2017  . Perforation of sigmoid  colon [facility])     Patient Active Problem List   Diagnosis Date Noted  . Acute pulmonary embolism ([place]) 01/24/2017  . Intra-abdominal abscess ([place]) 01/23/2017  . Leukocytosis 01/23/2017  . Hypokalemia 01/23/2017  . Hyponatremia 01/23/2017  . Hypomagnesemia 01/23/2017  . Drug eruption 01/23/2017  . Sinus tachycardia 01/23/2017  . Carcinoma of unknown primary ([place]) 01/22/2017  . Sepsis ([place]) 01/21/2017  . Phlegmonous peritonitis ([place])   . Pelvic pain in female 01/13/2017  . Large bowel perforation ([place]) 12/31/2016  . Perforation of sigmoid colon ([place])   . Abscess of abdominal cavity ([place])   . Dehydration, moderate 12/30/2016  . Metastatic adenocarcinoma ([place]) 12/20/2016  . Heme positive stool 08/01/2014    Past Surgical History:  Procedure Laterality Date  . COLONOSCOPY    . CYSTOSCOPY  12/20/2016   Procedure: CYSTOSCOPY;  Surgeon: Boykin Nearing, MD;  Location: [facility] ORS;  Service: Gynecology;;  . ESOPHAGOGASTRODUODENOSCOPY    . LAPAROSCOPIC BILATERAL SALPINGECTOMY Bilateral 12/20/2016   Procedure: LAPAROSCOPIC BILATERAL SALPINGOOPHERECTOMY,  Excision of anterior 5 cm calcified uterine fibroid;  Surgeon: Boykin Nearing, MD;  Location: [facility] ORS;  Service: Gynecology;  Laterality: Bilateral;  . LAPAROSCOPIC LYSIS OF ADHESIONS  12/20/2016   Procedure: LAPAROSCOPIC LYSIS OF ADHESIONS WITH DISSECTION OF LEFT TUBE AND OVARY.;  Surgeon: Olean Ree, MD;  Location: [facility] ORS;  Service: General;;  . LAPAROTOMY N/A 12/31/2016   Procedure: EXPLORATORY LAPAROTOMY, sigmoid colectomy, removal of omentum, colostomy;  Surgeon: Jules Husbands, MD;  Location: [facility] ORS;  Service: General;  Laterality: N/A;  . SIGMOIDOSCOPY  12/20/2016  Procedure: RIGID SIGMOIDOSCOPY;  Surgeon: Olean Ree, MD;  Location: [facility] ORS;  Service: General;;  . TONSILLECTOMY      Prior to Admission medications   Medication Sig Start Date End Date Taking? Authorizing Provider  acetaminophen (TYLENOL) 500 MG  tablet Take 500 mg by mouth as needed for mild pain.   Yes [provider]  apixaban (ELIQUIS) 5 MG TABS tablet Take 1 tablet (5 mg total) by mouth 2 (two) times daily. Please take 2 pills orally twice a day for 7 days as a loading dose, then continue 1 pill twice a day indefinitely, thank you Patient taking differently: Take 5-10 mg by mouth 2 (two) times daily. take 2 pills orally twice a day for 7 days as a loading dose, then continue 1 pill twice a day indefinitely 01/31/17  Yes Theodoro Grist, MD  diphenhydrAMINE (BENADRYL) 25 mg capsule Take 1 capsule (25 mg total) by mouth every 6 (six) hours as needed (Rash). 01/23/17  Yes Theodoro Grist, MD  meropenem 1 g in sodium chloride 0.9 % 100 mL Inject 1 g into the vein every 8 (eight) hours. 01/23/17  Yes Theodoro Grist, MD  metoprolol succinate (TOPROL-XL) 25 MG 24 hr tablet Take 1 tablet by mouth daily.  01/21/17  Yes [provider]  oxyCODONE-acetaminophen (ROXICET) 5-325 MG tablet Take 1 tablet by mouth every 6 (six) hours as needed. Patient taking differently: Take 1 tablet by mouth every 6 (six) hours as needed for moderate pain.  01/10/17 01/10/18 Yes Florene Glen, MD    Allergies Cefepime and Penicillins  Family History  Problem Relation Age of Onset  . Colon cancer Mother 73  . Diabetes Mother   . Asthma Father   . Breast cancer Neg Hx     Social History Social History  Substance Use Topics  . Smoking status: Never Smoker  . Smokeless tobacco: Never Used  . Alcohol use No    Review of Systems  Constitutional: + fever. Eyes: Negative for visual changes. ENT: Negative for sore throat. Neck: No neck pain  Cardiovascular: Negative for chest pain. Respiratory: Negative for shortness of breath. Gastrointestinal: Negative for abdominal pain, vomiting or diarrhea. Genitourinary: Negative for dysuria. Musculoskeletal: Negative for back pain. Skin: Negative for rash. Neurological: Negative for headaches,  weakness or numbness. Psych: No SI or HI  ____________________________________________   PHYSICAL EXAM:  VITAL SIGNS: ED Triage Vitals  Enc Vitals Group     BP 01/29/17 1702 130/78     Pulse Rate 01/29/17 1702 (!) 135     Resp 01/29/17 1702 18     Temp 01/29/17 1702 100.1 F (37.8 C)     Temp Source 01/29/17 1702 Oral     SpO2 01/29/17 1702 97 %     Weight 01/29/17 1703 121 lb (54.9 kg)     Height 01/29/17 1703 5\' 3"  (1.6 m)     Head Circumference --      Peak Flow --      Pain Score 01/29/17 1702 0     Pain Loc --      Pain Edu? --      Excl. in [place]? --     Constitutional: Alert and oriented. Well appearing and in no apparent distress. HEENT:      Head: Normocephalic and atraumatic.         Eyes: Conjunctivae are normal. Sclera is non-icteric.       Mouth/Throat: Mucous membranes are moist.       Neck:  Supple with no signs of meningismus. Cardiovascular: Tachycardic with regular rhythm. No murmurs, gallops, or rubs. 2+ symmetrical distal pulses are present in all extremities. No JVD. Respiratory: Normal respiratory effort. Lungs are clear to auscultation bilaterally. No wheezes, crackles, or rhonchi.  Gastrointestinal: Soft, non tender, and non distended with positive bowel sounds. No rebound or guarding. 2 JP drains with small amount of yellowish clear fluid, no surrounding erythema of the skin on the site of insertion. Wound VAC is in place with no surrounding erythema  Genitourinary: No CVA tenderness. Musculoskeletal: Nontender with normal range of motion in all extremities. No edema, cyanosis, or erythema of extremities. PICC line located in the left upper arm with no surrounding erythema Neurologic: Normal speech and language. Face is symmetric. Moving all extremities. No gross focal neurologic deficits are appreciated. Skin: Skin is warm, dry and intact. No rash noted. Psychiatric: Mood and affect are normal. Speech and behavior are  normal.  ____________________________________________   LABS (all labs ordered are listed, but only abnormal results are displayed)  Labs Reviewed  COMPREHENSIVE METABOLIC PANEL - Abnormal; Notable for the following:       Result Value   Sodium 132 (*)    Potassium 3.1 (*)    Chloride 93 (*)    Creatinine, Ser 0.38 (*)    Calcium 8.0 (*)    Total Protein 6.1 (*)    Albumin 2.5 (*)    AST 78 (*)    ALT 62 (*)    All other components within normal limits  CBC WITH DIFFERENTIAL/PLATELET - Abnormal; Notable for the following:    WBC 19.4 (*)    RBC 3.04 (*)    Hemoglobin 8.7 (*)    HCT 26.7 (*)    RDW 16.5 (*)    Platelets 627 (*)    Neutro Abs 17.3 (*)    Lymphs Abs 0.8 (*)    All other components within normal limits  PROTIME-INR - Abnormal; Notable for the following:    Prothrombin Time 18.1 (*)    All other components within normal limits  CULTURE, BLOOD (ROUTINE X 2)  CULTURE, BLOOD (ROUTINE X 2)  LACTIC ACID, PLASMA  LACTIC ACID, PLASMA  URINALYSIS, COMPLETE (UACMP) WITH MICROSCOPIC   ____________________________________________  EKG  ED ECG REPORT I, Rudene Re, the attending physician, personally viewed and interpreted this ECG.  Sinus tachycardia, rate of 122, normal intervals, normal axis, no ST elevations or depressions, T-wave inversions in inferior leads. Unchanged from prior from 12/31/16 ____________________________________________  RADIOLOGY  CTA c/a/p:  No definite evidence of pulmonary embolus.  Continued presence of multiple pulmonary and hepatic metastatic lesions.  Stable complex low density seen posteriorly in left renal cortex concerning for infection, infarction or possibly metastatic lesion.  Left periaortic adenopathy is noted with largest lymph node measuring 15 mm. This is consistent with metastatic disease.  Continued presence of surgical drain seen in subhepatic space and pigtail drainage catheter in the right lower  quadrant, with no significant fluid collection is noted around either catheter.  Continued presence of inflammatory changes in the pelvis adjacent to the fibroid uterus, with multiple small complex fluid collections which are not significantly changed compared to prior exam. ____________________________________________   PROCEDURES  Procedure(s) performed: None Procedures Critical Care performed: yes  CRITICAL CARE Performed by: Rudene Re  ?  Total critical care time: 40 min  Critical care time was exclusive of separately billable procedures and treating other patients.  Critical care was necessary to treat or prevent  imminent or life-threatening deterioration.  Critical care was time spent personally by me on the following activities: development of treatment plan with patient and/or surrogate as well as nursing, discussions with consultants, evaluation of patient's response to treatment, examination of patient, obtaining history from patient or surrogate, ordering and performing treatments and interventions, ordering and review of laboratory studies, ordering and review of radiographic studies, pulse oximetry and re-evaluation of patient's condition.  ____________________________________________   INITIAL IMPRESSION / ASSESSMENT AND PLAN / ED COURSE   51 y.o. 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 Patient's home health nurse also concerned about worsening tachycardia. Patient's surgical team recommended patient to come to the ER. Review of EPIC shows patient's HR has been in the mid 110s to mid 120s and patient tells me it has been persistently in the 120s at home since discharge. Now patient HR is between  120-130s, normotensive, low grade temp 100.69F. abdomen is soft with no tenderness to palpation, lungs are clear, patient has multiple potential sites of infection including a PICC line, wound VAC, two JP drains however they are all well appearing at this time. Patient has received meropenem at 3:45PM will broaden by adding vancomycin and will consult her surgical team.   Clinical Course as of Jan 29 2310  Wed Jan 29, 2017  1940 Spoke with Dr. Dahlia Byes who reinforced once again that patient is not a surgical candidate however he recommended repeating CT a/p to eval for worsening intra-abdominal infection. Will also CT her chest to rule out worsening clot burden.  [CV]    Clinical Course User Index [CV] Alfred Levins [place], MD    CT stable with resolution of PE. However with fever and tachycardia patient was admitted to the hospital for abx and monitoring.      Pertinent labs & imaging results that were available during my care of the patient were reviewed by me and considered in my medical decision making (see chart for details).    ____________________________________________   FINAL CLINICAL IMPRESSION(S) / ED DIAGNOSES  Final diagnoses:  Fever  Sepsis, due to unspecified organism [facility])      NEW MEDICATIONS STARTED DURING THIS VISIT:  New Prescriptions   No medications on file     Note:  This document was prepared using Dragon voice recognition software and may include unintentional dictation errors.    Rudene Re, MD 01/29/17 [phone]

## 2017-01-29 NOTE — Telephone Encounter (Signed)
Spoke with Dr. Burt Knack in regards to patient's vital sign changes and he would like for patient to be seen in Emergency Room for evaluation considering her recent admission for PE and current diagnosis of metastatic carcinoma with pelvic abscess.  Call returned to patient at this time. She denies any Chest Pain and SOB currently. I explained that Dr. Burt Knack would like her to be evaluated in the ED. The patient is very hesitant but is in agreement to going to ED.  Call made to patient's home health nurse whom called earlier, Carlota Raspberry. No answer. Left voicemail for return phone call.  Surgeon on call (Dr. Rosana Hoes) has been made aware.

## 2017-01-29 NOTE — ED Triage Notes (Signed)
Pt reports sent here by [facility] for evaluation of fever and elevated heart rate. Pt has wound vac and ostomy intact. Pt denies any symptoms.

## 2017-01-30 ENCOUNTER — Inpatient Hospital Stay: Payer: Managed Care, Other (non HMO)

## 2017-01-30 ENCOUNTER — Encounter: Payer: Self-pay | Admitting: Internal Medicine

## 2017-01-30 ENCOUNTER — Other Ambulatory Visit: Payer: Self-pay | Admitting: Oncology

## 2017-01-30 ENCOUNTER — Inpatient Hospital Stay: Payer: Managed Care, Other (non HMO) | Admitting: Oncology

## 2017-01-30 ENCOUNTER — Encounter: Payer: Managed Care, Other (non HMO) | Admitting: Surgery

## 2017-01-30 DIAGNOSIS — A419 Sepsis, unspecified organism: Secondary | ICD-10-CM

## 2017-01-30 LAB — CBC
HCT: 22.9 % — ABNORMAL LOW (ref 35.0–47.0)
HEMOGLOBIN: 7.5 g/dL — AB (ref 12.0–16.0)
MCH: 28.7 pg (ref 26.0–34.0)
MCHC: 32.6 g/dL (ref 32.0–36.0)
MCV: 88 fL (ref 80.0–100.0)
Platelets: 630 10*3/uL — ABNORMAL HIGH (ref 150–440)
RBC: 2.6 MIL/uL — AB (ref 3.80–5.20)
RDW: 16.9 % — ABNORMAL HIGH (ref 11.5–14.5)
WBC: 18 10*3/uL — ABNORMAL HIGH (ref 3.6–11.0)

## 2017-01-30 LAB — URINALYSIS, COMPLETE (UACMP) WITH MICROSCOPIC
BILIRUBIN URINE: NEGATIVE
Bacteria, UA: NONE SEEN
GLUCOSE, UA: NEGATIVE mg/dL
HGB URINE DIPSTICK: NEGATIVE
Ketones, ur: 20 mg/dL — AB
Leukocytes, UA: NEGATIVE
NITRITE: NEGATIVE
PROTEIN: 30 mg/dL — AB
Specific Gravity, Urine: 1.02 (ref 1.005–1.030)
Squamous Epithelial / LPF: NONE SEEN
pH: 6 (ref 5.0–8.0)

## 2017-01-30 LAB — BASIC METABOLIC PANEL
ANION GAP: 8 (ref 5–15)
BUN: 5 mg/dL — ABNORMAL LOW (ref 6–20)
CALCIUM: 7.7 mg/dL — AB (ref 8.9–10.3)
CO2: 29 mmol/L (ref 22–32)
Chloride: 97 mmol/L — ABNORMAL LOW (ref 101–111)
Creatinine, Ser: 0.34 mg/dL — ABNORMAL LOW (ref 0.44–1.00)
GFR calc non Af Amer: >60 mL/min (ref >60)
Glucose, Bld: 99 mg/dL (ref 65–99)
POTASSIUM: 2.7 mmol/L — AB (ref 3.5–5.1)
Sodium: 134 mmol/L — ABNORMAL LOW (ref 135–145)

## 2017-01-30 LAB — MAGNESIUM: Magnesium: 1.8 mg/dL (ref 1.7–2.4)

## 2017-01-30 LAB — PROTIME-INR
INR: 1.26
Prothrombin Time: 15.9 seconds — ABNORMAL HIGH (ref 11.4–15.2)

## 2017-01-30 LAB — LACTIC ACID, PLASMA: LACTIC ACID, VENOUS: 0.8 mmol/L (ref 0.5–1.9)

## 2017-01-30 LAB — PROCALCITONIN: PROCALCITONIN: 0.16 ng/mL

## 2017-01-30 LAB — MRSA PCR SCREENING: MRSA by PCR: NEGATIVE

## 2017-01-30 LAB — APTT: aPTT: 47 seconds — ABNORMAL HIGH (ref 24–36)

## 2017-01-30 LAB — PHOSPHORUS: Phosphorus: 2 mg/dL — ABNORMAL LOW (ref 2.5–4.6)

## 2017-01-30 MED ORDER — FLUCONAZOLE IN SODIUM CHLORIDE 400-0.9 MG/200ML-% IV SOLN
800.0000 mg | Freq: Once | INTRAVENOUS | Status: AC
  Administered 2017-01-30: 17:00:00 800 mg via INTRAVENOUS
  Filled 2017-01-30: qty 400

## 2017-01-30 MED ORDER — POTASSIUM CHLORIDE 10 MEQ/100ML IV SOLN
10.0000 meq | INTRAVENOUS | Status: AC
Start: 2017-01-30 — End: 2017-01-30
  Administered 2017-01-30 (×3): 10 meq via INTRAVENOUS
  Filled 2017-01-30 (×3): qty 100

## 2017-01-30 MED ORDER — METOPROLOL SUCCINATE ER 50 MG PO TB24
50.0000 mg | ORAL_TABLET | Freq: Once | ORAL | Status: AC
  Administered 2017-01-30: 50 mg via ORAL
  Filled 2017-01-30: qty 1

## 2017-01-30 MED ORDER — ENOXAPARIN SODIUM 40 MG/0.4ML ~~LOC~~ SOLN
40.0000 mg | SUBCUTANEOUS | Status: DC
  Administered 2017-01-30: 01:00:00 40 mg via SUBCUTANEOUS
  Filled 2017-01-30: qty 0.4

## 2017-01-30 MED ORDER — OXYCODONE-ACETAMINOPHEN 5-325 MG PO TABS: 1.0000 | ORAL_TABLET | Freq: Four times a day (QID) | ORAL | Status: DC | PRN

## 2017-01-30 MED ORDER — SODIUM CHLORIDE 0.9 % IV SOLN
INTRAVENOUS | Status: DC
  Administered 2017-01-30 – 2017-02-01 (×5): via INTRAVENOUS

## 2017-01-30 MED ORDER — FLUCONAZOLE IN SODIUM CHLORIDE 400-0.9 MG/200ML-% IV SOLN
400.0000 mg | Freq: Every day | INTRAVENOUS | Status: DC
  Administered 2017-01-31 – 2017-02-04 (×5): 400 mg via INTRAVENOUS
  Filled 2017-01-30 (×5): qty 200

## 2017-01-30 MED ORDER — POTASSIUM CHLORIDE CRYS ER 20 MEQ PO TBCR
20.0000 meq | EXTENDED_RELEASE_TABLET | Freq: Two times a day (BID) | ORAL | Status: DC
  Administered 2017-01-30 – 2017-01-31 (×3): 20 meq via ORAL
  Filled 2017-01-30 (×3): qty 1

## 2017-01-30 MED ORDER — APIXABAN 5 MG PO TABS
10.0000 mg | ORAL_TABLET | Freq: Two times a day (BID) | ORAL | Status: AC
  Administered 2017-01-30 (×2): 10 mg via ORAL
  Filled 2017-01-30 (×2): qty 2

## 2017-01-30 MED ORDER — IPRATROPIUM BROMIDE 0.02 % IN SOLN: 0.5000 mg | Freq: Four times a day (QID) | RESPIRATORY_TRACT | Status: DC | PRN

## 2017-01-30 MED ORDER — ONDANSETRON HCL 4 MG/2ML IJ SOLN: 4.0000 mg | Freq: Four times a day (QID) | INTRAMUSCULAR | Status: DC | PRN

## 2017-01-30 MED ORDER — ACETAMINOPHEN 325 MG PO TABS
650.0000 mg | ORAL_TABLET | Freq: Four times a day (QID) | ORAL | Status: DC | PRN
  Administered 2017-02-01 – 2017-02-03 (×4): 650 mg via ORAL
  Filled 2017-01-30 (×5): qty 2

## 2017-01-30 MED ORDER — ONDANSETRON HCL 4 MG PO TABS
4.0000 mg | ORAL_TABLET | Freq: Four times a day (QID) | ORAL | Status: DC | PRN
Start: 2017-01-30 — End: 2017-02-04

## 2017-01-30 MED ORDER — DIPHENHYDRAMINE HCL 25 MG PO CAPS
25.0000 mg | ORAL_CAPSULE | Freq: Four times a day (QID) | ORAL | Status: DC | PRN
  Administered 2017-01-30 (×3): 25 mg via ORAL
  Filled 2017-01-30 (×3): qty 1

## 2017-01-30 MED ORDER — ACETAMINOPHEN 650 MG RE SUPP: 650.0000 mg | Freq: Four times a day (QID) | RECTAL | Status: DC | PRN

## 2017-01-30 MED ORDER — POTASSIUM CHLORIDE CRYS ER 20 MEQ PO TBCR
40.0000 meq | EXTENDED_RELEASE_TABLET | Freq: Two times a day (BID) | ORAL | Status: AC
  Administered 2017-01-30 (×2): 40 meq via ORAL
  Filled 2017-01-30: qty 2
  Filled 2017-01-30: qty 1
  Filled 2017-01-30: qty 2

## 2017-01-30 MED ORDER — ALBUTEROL SULFATE (2.5 MG/3ML) 0.083% IN NEBU: 2.5000 mg | INHALATION_SOLUTION | Freq: Four times a day (QID) | RESPIRATORY_TRACT | Status: DC | PRN

## 2017-01-30 MED ORDER — SODIUM CHLORIDE 0.9 % IV SOLN
1.0000 g | Freq: Three times a day (TID) | INTRAVENOUS | Status: DC
  Administered 2017-01-30 – 2017-02-04 (×17): 1 g via INTRAVENOUS
  Filled 2017-01-30 (×20): qty 1

## 2017-01-30 MED ORDER — MORPHINE SULFATE (PF) 2 MG/ML IV SOLN: 1.0000 mg | INTRAVENOUS | Status: DC | PRN

## 2017-01-30 MED ORDER — VANCOMYCIN HCL IN DEXTROSE 1-5 GM/200ML-% IV SOLN
1000.0000 mg | Freq: Two times a day (BID) | INTRAVENOUS | Status: DC
  Administered 2017-01-30: 02:00:00 1000 mg via INTRAVENOUS
  Filled 2017-01-30 (×3): qty 200

## 2017-01-30 MED ORDER — CHLORHEXIDINE GLUCONATE CLOTH 2 % EX PADS
6.0000 | MEDICATED_PAD | Freq: Once | CUTANEOUS | Status: AC
  Administered 2017-01-31: 06:00:00 6 via TOPICAL

## 2017-01-30 MED ORDER — APIXABAN 5 MG PO TABS
5.0000 mg | ORAL_TABLET | Freq: Two times a day (BID) | ORAL | Status: DC
  Administered 2017-01-31 – 2017-02-04 (×9): 5 mg via ORAL
  Filled 2017-01-30 (×9): qty 1

## 2017-01-30 NOTE — Progress Notes (Signed)
[facility] [place] NAME: Anna [name]    MR#:  [id number]  DATE OF BIRTH:  [birth year]  SUBJECTIVE:   Patient here due to fever and suspected sepsis. No fever overnight. No clear source of infection identified presently. Clinically asymptomatic. No nausea, vomiting or abdominal pain.  REVIEW OF SYSTEMS:    Review of Systems  Constitutional: Negative for chills and fever.  HENT: Negative for congestion and tinnitus.   Eyes: Negative for blurred vision and double vision.  Respiratory: Negative for cough, shortness of breath and wheezing.   Cardiovascular: Negative for chest pain, orthopnea and PND.  Gastrointestinal: Negative for abdominal pain, diarrhea, nausea and vomiting.  Genitourinary: Negative for dysuria and hematuria.  Neurological: Negative for dizziness, sensory change and focal weakness.  All other systems reviewed and are negative.   Nutrition: heart Healthy Tolerating Diet: Yes Tolerating PT: Await Eval.    DRUG ALLERGIES:   Allergies  Allergen Reactions  . Cefepime Rash  . Penicillins Rash    As a child. Has patient had a PCN reaction causing immediate rash, facial/tongue/throat swelling, SOB or lightheadedness with hypotension: Unknown Has patient had a PCN reaction causing severe rash involving mucus membranes or skin necrosis: Unknown Has patient had a PCN reaction that required hospitalization: No Has patient had a PCN reaction occurring within the last 10 years: No If all of the above answers are "NO", then may proceed with Cephalosporin use.     VITALS:  Blood pressure 106/67, pulse (!) 124, temperature 98.9 F (37.2 C), temperature source Oral, resp. rate 20, height 5\' 3"  (1.6 m), weight 54.9 kg (121 lb), last menstrual period 03/01/2015, SpO2 97 %.  PHYSICAL EXAMINATION:   Physical Exam  GENERAL:  51 y.o.-year-old patient lying in bed in no acute distress.  EYES: Pupils equal, round, reactive to light  and accommodation. No scleral icterus. Extraocular muscles intact.  HEENT: Head atraumatic, normocephalic. Oropharynx and nasopharynx clear.  NECK:  Supple, no jugular venous distention. No thyroid enlargement, no tenderness.  LUNGS: Normal breath sounds bilaterally, no wheezing, rales, rhonchi. No use of accessory muscles of respiration.  CARDIOVASCULAR: S1, S2 normal . No murmurs, rubs, or gallops.  ABDOMEN: Soft, nontender, nondistended. Bowel sounds present. No organomegaly or mass.  EXTREMITIES: No cyanosis, clubbing or edema b/l.    NEUROLOGIC: Cranial nerves II through XII are intact. No focal Motor or sensory deficits b/l.   PSYCHIATRIC: The patient is alert and oriented x 3.  SKIN: No obvious rash, lesion, or ulcer.    LABORATORY PANEL:   CBC  Recent Labs Lab 01/30/17 0100  WBC 18.0*  HGB 7.5*  HCT 22.9*  PLT 630*   ------------------------------------------------------------------------------------------------------------------  Chemistries   Recent Labs Lab 01/29/17 1714 01/30/17 0100  NA 132* 134*  K 3.1* 2.7*  CL 93* 97*  CO2 30 29  GLUCOSE 96 99  BUN 11 5*  CREATININE 0.38* 0.34*  CALCIUM 8.0* 7.7*  MG  --  1.8  AST 78*  --   ALT 62*  --   ALKPHOS 90  --   BILITOT 0.7  --    ------------------------------------------------------------------------------------------------------------------  Cardiac Enzymes No results for input(s): TROPONINI in the last 168 hours. ------------------------------------------------------------------------------------------------------------------  RADIOLOGY:  Dg Chest 2 View  Result Date: 01/29/2017 CLINICAL DATA:  51 year old with metastatic colon cancer, presenting with fever. Current history of pulmonary emboli. EXAM: CHEST  2 VIEW COMPARISON:  CTA chest 01/23/2017.  Chest x-ray 01/23/2017. FINDINGS: AP semi-erect and  lateral images were obtained. Cardiomediastinal silhouette unremarkable, unchanged. Multiple bilateral  lung nodules as identified on the 01/23/2017 examinations. Mild atelectasis in the lower lobes as noted on the prior CT. No new pulmonary parenchymal abnormalities. No visible pleural effusions. Left arm PICC tip in the lower SVC. IMPRESSION: 1. Stable atelectasis involving the lower lobes since the CT 01/23/2017. 2. Multiple bilateral pulmonary metastases as noted on the CT. 3. No new/acute cardiopulmonary abnormalities. Electronically Signed   By: Evangeline Dakin M.D.   On: 01/29/2017 18:17   Ct Angio Chest Pe W Or Wo Contrast  Result Date: 01/29/2017 CLINICAL DATA:  Fever, tachycardia. EXAM: CT ANGIOGRAPHY CHEST CT ABDOMEN AND PELVIS WITH CONTRAST TECHNIQUE: Multidetector CT imaging of the chest was performed using the standard protocol during bolus administration of intravenous contrast. Multiplanar CT image reconstructions and MIPs were obtained to evaluate the vascular anatomy. Multidetector CT imaging of the abdomen and pelvis was performed using the standard protocol during bolus administration of intravenous contrast. CONTRAST:  100 mL of Isovue 370 intravenously. COMPARISON:  CT scan of Jan 21, 2017. FINDINGS: CTA CHEST FINDINGS Cardiovascular: Satisfactory opacification of the pulmonary arteries to the segmental level. No evidence of pulmonary embolism. Normal heart size. No pericardial effusion. Mediastinum/Nodes: No enlarged mediastinal, hilar, or axillary lymph nodes. Thyroid gland, trachea, and esophagus demonstrate no significant findings. Lungs/Pleura: Multiple nodular densities are noted throughout both lungs consistent with metastatic disease. Minimal bibasilar subsegmental atelectasis is noted. No pneumothorax or pleural effusion is noted. Musculoskeletal: No chest wall abnormality. No acute or significant osseous findings. Review of the MIP images confirms the above findings. CT ABDOMEN and PELVIS FINDINGS Hepatobiliary: The gallstones are noted. Stable hepatic lesions are noted consistent  with metastatic disease. Pancreas: Unremarkable. No pancreatic ductal dilatation or surrounding inflammatory changes. Spleen: Normal in size without focal abnormality. Adrenals/Urinary Tract: Adrenal glands appear normal. No hydronephrosis or renal obstruction is noted. No renal or ureteral calculi are noted. Urinary bladder is unremarkable. Stable complex low density is noted posteriorly in the left renal cortex concerning for infection, infarction or possibly metastatic disease. Stomach/Bowel: There is no evidence of bowel obstruction. 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 Vascular/Lymphatic: No significant vascular abnormality is noted. Left periaortic adenopathy is noted consistent with metastatic disease. The largest measures 15 mm. Reproductive: Reportedly, both ovaries have been removed. Probable fibroid uterus is noted. Continued presence of inflammatory changes seen around the uterus in the pelvis with multiple small fluid collections, the largest measuring 33 x 21 mm. These do not appear to be significantly changed compared to prior exam. Other: Surgical drain remains in the subhepatic space. No fluid collection is noted around this drain. Continued and stable presence of pigtail drainage catheter seen in the right lower quadrant with no significant fluid collection noted around it. Midline surgical incision is noted. Musculoskeletal: No acute or significant osseous findings. Review of the MIP images confirms the above findings. IMPRESSION: No definite evidence of pulmonary embolus. Continued presence of multiple pulmonary and hepatic metastatic lesions. Stable complex low density seen posteriorly in left renal cortex concerning for infection, infarction or possibly metastatic lesion. Left periaortic adenopathy is noted with largest lymph node measuring 15 mm. This is consistent with metastatic disease. Continued presence of surgical drain seen in subhepatic space and pigtail drainage catheter in  the right lower quadrant, with no significant fluid collection is noted around either catheter. Continued presence of inflammatory changes in the pelvis adjacent to the fibroid uterus, with multiple small complex fluid  collections which are not significantly changed compared to prior exam. Electronically Signed   By: Marijo Conception, M.D.   On: 01/29/2017 20:38   Ct Abdomen Pelvis W Contrast  Result Date: 01/29/2017 CLINICAL DATA:  Fever, tachycardia. EXAM: CT ANGIOGRAPHY CHEST CT ABDOMEN AND PELVIS WITH CONTRAST TECHNIQUE: Multidetector CT imaging of the chest was performed using the standard protocol during bolus administration of intravenous contrast. Multiplanar CT image reconstructions and MIPs were obtained to evaluate the vascular anatomy. Multidetector CT imaging of the abdomen and pelvis was performed using the standard protocol during bolus administration of intravenous contrast. CONTRAST:  100 mL of Isovue 370 intravenously. COMPARISON:  CT scan of Jan 21, 2017. FINDINGS: CTA CHEST FINDINGS Cardiovascular: Satisfactory opacification of the pulmonary arteries to the segmental level. No evidence of pulmonary embolism. Normal heart size. No pericardial effusion. Mediastinum/Nodes: No enlarged mediastinal, hilar, or axillary lymph nodes. Thyroid gland, trachea, and esophagus demonstrate no significant findings. Lungs/Pleura: Multiple nodular densities are noted throughout both lungs consistent with metastatic disease. Minimal bibasilar subsegmental atelectasis is noted. No pneumothorax or pleural effusion is noted. Musculoskeletal: No chest wall abnormality. No acute or significant osseous findings. Review of the MIP images confirms the above findings. CT ABDOMEN and PELVIS FINDINGS Hepatobiliary: The gallstones are noted. Stable hepatic lesions are noted consistent with metastatic disease. Pancreas: Unremarkable. No pancreatic ductal dilatation or surrounding inflammatory changes. Spleen: Normal in size  without focal abnormality. Adrenals/Urinary Tract: Adrenal glands appear normal. No hydronephrosis or renal obstruction is noted. No renal or ureteral calculi are noted. Urinary bladder is unremarkable. Stable complex low density is noted posteriorly in the left renal cortex concerning for infection, infarction or possibly metastatic disease. Stomach/Bowel: There is no evidence of bowel obstruction. 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 Vascular/Lymphatic: No significant vascular abnormality is noted. Left periaortic adenopathy is noted consistent with metastatic disease. The largest measures 15 mm. Reproductive: Reportedly, both ovaries have been removed. Probable fibroid uterus is noted. Continued presence of inflammatory changes seen around the uterus in the pelvis with multiple small fluid collections, the largest measuring 33 x 21 mm. These do not appear to be significantly changed compared to prior exam. Other: Surgical drain remains in the subhepatic space. No fluid collection is noted around this drain. Continued and stable presence of pigtail drainage catheter seen in the right lower quadrant with no significant fluid collection noted around it. Midline surgical incision is noted. Musculoskeletal: No acute or significant osseous findings. Review of the MIP images confirms the above findings. IMPRESSION: No definite evidence of pulmonary embolus. Continued presence of multiple pulmonary and hepatic metastatic lesions. Stable complex low density seen posteriorly in left renal cortex concerning for infection, infarction or possibly metastatic lesion. Left periaortic adenopathy is noted with largest lymph node measuring 15 mm. This is consistent with metastatic disease. Continued presence of surgical drain seen in subhepatic space and pigtail drainage catheter in the right lower quadrant, with no significant fluid collection is noted around either catheter. Continued presence of inflammatory changes in  the pelvis adjacent to the fibroid uterus, with multiple small complex fluid collections which are not significantly changed compared to prior exam. Electronically Signed   By: Marijo Conception, M.D.   On: 01/29/2017 20:38     ASSESSMENT AND PLAN:   51 year old female with past medical history of metastatic carcinomatosis with unknown primary, hx of DVT, recent admission due to sepsis secondary to pelvic abscesses status post PICC line placement on long term antibiotics  with drain placement, who presented to the hospital from home due to fever.  1. Sepsis-patient will then given her fever, tachycardia, leukocytosis. She was recently admitted to the hospital for pelvic abscesses and had a drain placed in the right lower quadrant and was placed on long-term IV antibiotics with meropenem. -No clear source presently identified. Empirically patient placed on IV Vanc, Meropenem.  MRSA PCR (-) and will d/c Vanc.  - CT chest/abd/pelvis still showing fluid collections consistent with infection. ?? Repositioning of the IR tube. Await further ID and Surgical input.   2. Metastatic carcinomatosis with unknown primary-patient has adenocarcinoma but primary has not been identified. Patient has had extensive metastatic disease. -follows w/ Dr. Rogue Bussing and plan for starting treatment with immune modulators when stable.   3. History of recent DVT-continue Eliquis  4. Essential HTN - cont. Toprol.   5. Hypokalemia - will place on Oral Potassium supplements and repeat level in a.m.  - Mg. Level is normal.    All the records are reviewed and case discussed with Care Management/Social Worker. Management plans discussed with the patient, family and they are in agreement.  CODE STATUS: Full code  DVT Prophylaxis: Eliquis  TOTAL TIME TAKING CARE OF THIS PATIENT: 30 minutes.   POSSIBLE D/C IN 2-3 DAYS, DEPENDING ON CLINICAL CONDITION.   Henreitta Leber M.D on 01/30/2017 at 1:18 PM  Between 7am to 6pm -  Pager - [phone]  After 6pm go to [email or web address] - Proofreader  [facility]  Office  [phone]  CC: Primary care physician; McLean-Scocuzza, Nino Glow, MD

## 2017-01-30 NOTE — Progress Notes (Signed)
ANTICOAGULATION CONSULT NOTE - Initial Consult  Pharmacy Consult for apixaban Indication: pulmonary embolus  Allergies  Allergen Reactions  . Cefepime Rash  . Penicillins Rash    As a child. Has patient had a PCN reaction causing immediate rash, facial/tongue/throat swelling, SOB or lightheadedness with hypotension: Unknown Has patient had a PCN reaction causing severe rash involving mucus membranes or skin necrosis: Unknown Has patient had a PCN reaction that required hospitalization: No Has patient had a PCN reaction occurring within the last 10 years: No If all of the above answers are "NO", then may proceed with Cephalosporin use.     Patient Measurements: Height: 5\' 3"  (160 cm) Weight: 121 lb (54.9 kg) IBW/kg (Calculated) : 52.4 Heparin Dosing Weight: 54.9 kg  Vital Signs: Temp: 99.7 F (37.6 C) (06/06 2356) Temp Source: Oral (06/06 2356) BP: 128/72 (06/06 2356) Pulse Rate: 125 (06/06 2356)  Labs:  Recent Labs  01/29/17 1714 01/30/17 0100  HGB 8.7* 7.5*  HCT 26.7* 22.9*  PLT 627* 630*  APTT  --  47*  LABPROT 18.1* 15.9*  INR 1.48 1.26  CREATININE 0.38*  --     Estimated Creatinine Clearance: 68 mL/min (A) (by C-G formula based on SCr of 0.38 mg/dL (L)).   Medical History: Past Medical History:  Diagnosis Date  . Cancer ([place])   . Cancer with unknown primary site [facility])   . Colon polyp   . Diverticulosis   . Fibroid uterus   . Heme positive stool 08/01/2014  . HH (hiatus hernia)   . Large bowel perforation ([place]) 12/31/2016  . Metastatic adenocarcinoma ([place]) 12/20/2016  . Pelvic pain in female 01/13/2017  . Perforation of sigmoid colon ([facility])     Medications:  Scheduled:  . apixaban  10 mg Oral BID   Followed by  . [START ON 01/31/2017] apixaban  5 mg Oral BID  . potassium chloride  40 mEq Oral BID    Assessment: Patient admitted for fever and has recent h/o PE is being anticoagulated with apixaban. Apixaban started on 6/1: Apixaban 10 mg twice  daily for 7 days load (last day of load 6/7). Apixaban 5 mg twice daily indefinitely starting 6/8.  Goal of Therapy:  Monitor platelets by anticoagulation protocol: Yes   Plan:  Restarting patient's apixaban 10 mg twice daily for one more day (6/7). Then starting apixaban 5 mg twice daily (starting 6/8 indefinitely).  Thank you for this consult.  Tobie Lords, PharmD, BCPS Clinical Pharmacist 01/30/2017

## 2017-01-30 NOTE — Progress Notes (Signed)
Pharmacy Antibiotic Note  Anna [name] is a 51 y.o. female admitted on 01/29/2017 with sepsis due to intraabdominal infection.  Pharmacy has been consulted for meropenem and fluconazole dosing.  Plan: Fluconazole 800 mg iv once followed by 400 mg iv q 24 hours.   Will continue meropenem 1g IV q8h   Height: 5\' 3"  (160 cm) Weight: 121 lb (54.9 kg) IBW/kg (Calculated) : 52.4  Temp (24hrs), Avg:99.5 F (37.5 C), Min:97.7 F (36.5 C), Max:100.3 F (37.9 C)   Recent Labs Lab 01/24/17 0618 01/29/17 0058 01/29/17 1714 01/30/17 0100  WBC 15.9*  --  19.4* 18.0*  CREATININE  --   --  0.38* 0.34*  LATICACIDVEN  --  0.8 1.0  --     Estimated Creatinine Clearance: 68 mL/min (A) (by C-G formula based on SCr of 0.34 mg/dL (L)).    Allergies  Allergen Reactions  . Cefepime Rash  . Penicillins Rash    As a child. Has patient had a PCN reaction causing immediate rash, facial/tongue/throat swelling, SOB or lightheadedness with hypotension: Unknown Has patient had a PCN reaction causing severe rash involving mucus membranes or skin necrosis: Unknown Has patient had a PCN reaction that required hospitalization: No Has patient had a PCN reaction occurring within the last 10 years: No If all of the above answers are "NO", then may proceed with Cephalosporin use.      Thank you for allowing pharmacy to be a part of this patient's care.  Ulice Dash, PharmD Clinical Pharmacist  01/30/2017

## 2017-01-30 NOTE — Progress Notes (Addendum)
Pharmacy Antibiotic Note  Anna [name] is a 51 y.o. female admitted on 01/29/2017 with sepsis s/t to intraabdominal infection.  Pharmacy has been consulted for vancomycin dosing.  Plan: Patient received vanc 1g IV x 1 in ED  Will continue w/ vanc 1g IV q12h w/ 8 hour stack dose. Will draw vanc trough 6/8 @ 1330 prior to 4th dose. Goal trough 15 - 20 mcg/mL  Will start meropenem 1g IV q8h   Height: 5\' 3"  (160 cm) Weight: 121 lb (54.9 kg) IBW/kg (Calculated) : 52.4  Temp (24hrs), Avg:99.9 F (37.7 C), Min:99.7 F (37.6 C), Max:100.1 F (37.8 C)   Recent Labs Lab 01/23/17 0706 01/24/17 0618 01/29/17 1714  WBC 20.5* 15.9* 19.4*  CREATININE 0.48  --  0.38*  LATICACIDVEN  --   --  1.0    Estimated Creatinine Clearance: 68 mL/min (A) (by C-G formula based on SCr of 0.38 mg/dL (L)).    Allergies  Allergen Reactions  . Cefepime Rash  . Penicillins Rash    As a child. Has patient had a PCN reaction causing immediate rash, facial/tongue/throat swelling, SOB or lightheadedness with hypotension: Unknown Has patient had a PCN reaction causing severe rash involving mucus membranes or skin necrosis: Unknown Has patient had a PCN reaction that required hospitalization: No Has patient had a PCN reaction occurring within the last 10 years: No If all of the above answers are "NO", then may proceed with Cephalosporin use.      Thank you for allowing pharmacy to be a part of this patient's care.  Tobie Lords, PharmD, BCPS Clinical Pharmacist 01/30/2017

## 2017-01-30 NOTE — Progress Notes (Signed)
CRITICAL VALUE ALERT  Critical Value: Potassium 2.7  Date & Time Notied: 01/30/2017  Provider Notified: Harvie Bridge  Orders Received/Actions taken: new orders

## 2017-01-30 NOTE — Consult Note (Signed)
[facility] Infectious Disease     Reason for Consult: Intra-abdominal infection   Referring Physician: Jeronimo Greaves Date of Admission:  01/29/2017   Active Problems:   Sepsis ([place])   HPI: Anna [name] is a 51 y.o. female with recent dx of metastatic adenocarcinoma of unknown primary , s.p colonic perforation and Hartmanns pouch procedure May 8 complicated by intra-abdominal abscess. May 8 cx grew E coli, Eikenella, viridans strep.  May 13 had placement of drain with findings of 40 ML fluid and debris - cx negative.  Dced initially on cipro and flagyl but had worsening wbc up to 24 K so readmitted 5/29-6/1. CT done showed worsening metastatic disease, phlegmon in pelvis with presummed abscess collections with pigtail cath in place. Started on cefepime but developed a rash but was able to tolerate meropenem. We placed picc and she was dced on IV meropenem Now readmitted with fevers and peristently elevated WBC. Anna [name] messaged Dr Dahlia Byes because otpt home health labs showed wbc 20 and plts elevated in 700s. Plan was to repeat a CT.  CT has been done this admission and still shows pelvic inflamm changes and multiple small complex fluid collections unchanged.  She currently denies abd pain, fevers improving. PICC working well Received vanco last pm and had rash and redmans. She had no rash at home with them meropenem.  Past Medical History:  Diagnosis Date  . Cancer ([place])   . Cancer with unknown primary site [facility])   . Colon polyp   . Diverticulosis   . Fibroid uterus   . Heme positive stool 08/01/2014  . HH (hiatus hernia)   . Large bowel perforation ([place]) 12/31/2016  . Metastatic adenocarcinoma ([place]) 12/20/2016  . Pelvic pain in female 01/13/2017  . Perforation of sigmoid colon [facility])    Past Surgical History:  Procedure Laterality Date  . COLONOSCOPY    . CYSTOSCOPY  12/20/2016   Procedure: CYSTOSCOPY;  Surgeon: Boykin Nearing, MD;  Location: [facility] ORS;  Service: Gynecology;;   . ESOPHAGOGASTRODUODENOSCOPY    . LAPAROSCOPIC BILATERAL SALPINGECTOMY Bilateral 12/20/2016   Procedure: LAPAROSCOPIC BILATERAL SALPINGOOPHERECTOMY,  Excision of anterior 5 cm calcified uterine fibroid;  Surgeon: Boykin Nearing, MD;  Location: [facility] ORS;  Service: Gynecology;  Laterality: Bilateral;  . LAPAROSCOPIC LYSIS OF ADHESIONS  12/20/2016   Procedure: LAPAROSCOPIC LYSIS OF ADHESIONS WITH DISSECTION OF LEFT TUBE AND OVARY.;  Surgeon: Olean Ree, MD;  Location: [facility] ORS;  Service: General;;  . LAPAROTOMY N/A 12/31/2016   Procedure: EXPLORATORY LAPAROTOMY, sigmoid colectomy, removal of omentum, colostomy;  Surgeon: Jules Husbands, MD;  Location: [facility] ORS;  Service: General;  Laterality: N/A;  . SIGMOIDOSCOPY  12/20/2016   Procedure: RIGID SIGMOIDOSCOPY;  Surgeon: Olean Ree, MD;  Location: [facility] ORS;  Service: General;;  . TONSILLECTOMY     Social History  Substance Use Topics  . Smoking status: Never Smoker  . Smokeless tobacco: Never Used  . Alcohol use No   Family History  Problem Relation Age of Onset  . Colon cancer Mother 80  . Diabetes Mother   . Asthma Father   . Breast cancer Neg Hx     Allergies:  Allergies  Allergen Reactions  . Cefepime Rash  . Penicillins Rash    As a child. Has patient had a PCN reaction causing immediate rash, facial/tongue/throat swelling, SOB or lightheadedness with hypotension: Unknown Has patient had a PCN reaction causing severe rash involving mucus membranes or skin necrosis: Unknown Has patient had a PCN  reaction that required hospitalization: No Has patient had a PCN reaction occurring within the last 10 years: No If all of the above answers are "NO", then may proceed with Cephalosporin use.     Current antibiotics: Antibiotics Given (last 72 hours)    Date/Time Action Medication Dose Rate   01/29/17 1832 New Bag/Given   vancomycin (VANCOCIN) IVPB 1000 mg/200 mL premix 1,000 mg 200 mL/hr   01/30/17 0228 New Bag/Given    vancomycin (VANCOCIN) IVPB 1000 mg/200 mL premix 1,000 mg 200 mL/hr   01/30/17 0228 New Bag/Given   meropenem (MERREM) 1 g in sodium chloride 0.9 % 100 mL IVPB 1 g 200 mL/hr   01/30/17 0917 New Bag/Given   meropenem (MERREM) 1 g in sodium chloride 0.9 % 100 mL IVPB 1 g 200 mL/hr      MEDICATIONS: . apixaban  10 mg Oral BID   Followed by  . [START ON 01/31/2017] apixaban  5 mg Oral BID  . potassium chloride  20 mEq Oral BID    Review of Systems - 11 systems reviewed and negative per HPI   OBJECTIVE: Temp:  [97.7 F (36.5 C)-100.3 F (37.9 C)] 98.9 F (37.2 C) (06/07 1207) Pulse Rate:  [122-135] 124 (06/07 1207) Resp:  [18-42] 20 (06/07 1201) BP: (104-143)/(60-94) 106/67 (06/07 1201) SpO2:  [97 %-100 %] 97 % (06/07 1201) Weight:  [54.9 kg (121 lb)] 54.9 kg (121 lb) (06/06 1703) Physical Exam  Constitutional:  oriented to person, place, and time. appears well-developed and well-nourished. No distress.  HENT: [place]/AT, PERRLA, no scleral icterus Mouth/Throat: Oropharynx is clear and moist. No oropharyngeal exudate.  Cardiovascular: Normal rate, regular rhythm and normal heart sounds.  Pulmonary/Chest: Effort normal and breath sounds normal. No respiratory distress.  has no wheezes.  Neck = supple, no nuchal rigidity Abdominal: Soft. Wound vac over ant abd incision site. 2 JP drains in RLQ and pelvic region with thin serous drainage Lymphadenopathy: no cervical adenopathy. No axillary adenopathy Neurological: alert and oriented to person, place, and time.  Skin: Skin is warm and dry. No rash noted. No erythema.  Psychiatric: a normal mood and affect.  behavior is normal.    LABS: Results for orders placed or performed during the hospital encounter of 01/29/17 (from the past 48 hour(s))  Lactic acid, plasma     Status: None   Collection Time: 01/29/17 12:58 AM  Result Value Ref Range   Lactic Acid, Venous 0.8 0.5 - 1.9 mmol/L  Comprehensive metabolic panel     Status: Abnormal    Collection Time: 01/29/17  5:14 PM  Result Value Ref Range   Sodium 132 (L) 135 - 145 mmol/L   Potassium 3.1 (L) 3.5 - 5.1 mmol/L   Chloride 93 (L) 101 - 111 mmol/L   CO2 30 22 - 32 mmol/L   Glucose, Bld 96 65 - 99 mg/dL   BUN 11 6 - 20 mg/dL   Creatinine, Ser 0.38 (L) 0.44 - 1.00 mg/dL   Calcium 8.0 (L) 8.9 - 10.3 mg/dL   Total Protein 6.1 (L) 6.5 - 8.1 g/dL   Albumin 2.5 (L) 3.5 - 5.0 g/dL   AST 78 (H) 15 - 41 U/L   ALT 62 (H) 14 - 54 U/L   Alkaline Phosphatase 90 38 - 126 U/L   Total Bilirubin 0.7 0.3 - 1.2 mg/dL   GFR calc non Af Amer >60 >60 mL/min   GFR calc Af Amer >60 >60 mL/min    Comment: (NOTE) The eGFR  has been calculated using the CKD EPI equation. This calculation has not been validated in all clinical situations. eGFR's persistently <60 mL/min signify possible Chronic Kidney Disease.    Anion gap 9 5 - 15  Lactic acid, plasma     Status: None   Collection Time: 01/29/17  5:14 PM  Result Value Ref Range   Lactic Acid, Venous 1.0 0.5 - 1.9 mmol/L  CBC with Differential     Status: Abnormal   Collection Time: 01/29/17  5:14 PM  Result Value Ref Range   WBC 19.4 (H) 3.6 - 11.0 K/uL   RBC 3.04 (L) 3.80 - 5.20 MIL/uL   Hemoglobin 8.7 (L) 12.0 - 16.0 g/dL   HCT 26.7 (L) 35.0 - 47.0 %   MCV 87.8 80.0 - 100.0 fL   MCH 28.7 26.0 - 34.0 pg   MCHC 32.7 32.0 - 36.0 g/dL   RDW 16.5 (H) 11.5 - 14.5 %   Platelets 627 (H) 150 - 440 K/uL   Neutrophils Relative % 90 %   Neutro Abs 17.3 (H) 1.4 - 6.5 K/uL   Lymphocytes Relative 4 %   Lymphs Abs 0.8 (L) 1.0 - 3.6 K/uL   Monocytes Relative 4 %   Monocytes Absolute 0.8 0.2 - 0.9 K/uL   Eosinophils Relative 2 %   Eosinophils Absolute 0.4 0 - 0.7 K/uL   Basophils Relative 0 %   Basophils Absolute 0.0 0 - 0.1 K/uL  Protime-INR     Status: Abnormal   Collection Time: 01/29/17  5:14 PM  Result Value Ref Range   Prothrombin Time 18.1 (H) 11.4 - 15.2 seconds   INR 1.48   Culture, blood (Routine x 2)     Status: None  (Preliminary result)   Collection Time: 01/29/17  5:14 PM  Result Value Ref Range   Specimen Description BLOOD RAC    Special Requests      BOTTLES DRAWN AEROBIC AND ANAEROBIC Blood Culture adequate volume   Culture NO GROWTH < 24 HOURS    Report Status PENDING   Culture, blood (Routine x 2)     Status: None (Preliminary result)   Collection Time: 01/29/17  5:14 PM  Result Value Ref Range   Specimen Description BLOOD LARM    Special Requests      BOTTLES DRAWN AEROBIC AND ANAEROBIC Blood Culture adequate volume   Culture NO GROWTH < 24 HOURS    Report Status PENDING   Urinalysis, Complete w Microscopic     Status: Abnormal   Collection Time: 01/29/17  5:14 PM  Result Value Ref Range   Color, Urine AMBER (A) YELLOW    Comment: BIOCHEMICALS MAY BE AFFECTED BY COLOR   APPearance TURBID (A) CLEAR   Specific Gravity, Urine 1.020 1.005 - 1.030   pH 6.0 5.0 - 8.0   Glucose, UA NEGATIVE NEGATIVE mg/dL   Hgb urine dipstick NEGATIVE NEGATIVE   Bilirubin Urine NEGATIVE NEGATIVE   Ketones, ur 20 (A) NEGATIVE mg/dL   Protein, ur 30 (A) NEGATIVE mg/dL   Nitrite NEGATIVE NEGATIVE   Leukocytes, UA NEGATIVE NEGATIVE   RBC / HPF TOO NUMEROUS TO COUNT 0 - 5 RBC/hpf   WBC, UA 0-5 0 - 5 WBC/hpf   Bacteria, UA NONE SEEN NONE SEEN   Squamous Epithelial / LPF NONE SEEN NONE SEEN  Procalcitonin     Status: None   Collection Time: 01/30/17  1:00 AM  Result Value Ref Range   Procalcitonin 0.16 ng/mL    Comment:  Interpretation: PCT (Procalcitonin) <= 0.5 ng/mL: Systemic infection (sepsis) is not likely. Local bacterial infection is possible. (NOTE)         ICU PCT Algorithm               Non ICU PCT Algorithm    ----------------------------     ------------------------------         PCT < 0.25 ng/mL                 PCT < 0.1 ng/mL     Stopping of antibiotics            Stopping of antibiotics       strongly encouraged.               strongly encouraged.    ----------------------------      ------------------------------       PCT level decrease by               PCT < 0.25 ng/mL       >= 80% from peak PCT       OR PCT 0.25 - 0.5 ng/mL          Stopping of antibiotics                                             encouraged.     Stopping of antibiotics           encouraged.    ----------------------------     ------------------------------       PCT level decrease by              PCT >= 0.25 ng/mL       < 80% from peak PCT        AND PCT >= 0.5 ng/mL            Continuin g antibiotics                                              encouraged.       Continuing antibiotics            encouraged.    ----------------------------     ------------------------------     PCT level increase compared          PCT > 0.5 ng/mL         with peak PCT AND          PCT >= 0.5 ng/mL             Escalation of antibiotics                                          strongly encouraged.      Escalation of antibiotics        strongly encouraged.   Protime-INR     Status: Abnormal   Collection Time: 01/30/17  1:00 AM  Result Value Ref Range   Prothrombin Time 15.9 (H) 11.4 - 15.2 seconds   INR 1.26   APTT     Status: Abnormal   Collection Time: 01/30/17  1:00 AM  Result Value Ref Range   aPTT 47 (  H) 24 - 36 seconds    Comment:        IF BASELINE aPTT IS ELEVATED, SUGGEST PATIENT RISK ASSESSMENT BE USED TO DETERMINE APPROPRIATE ANTICOAGULANT THERAPY.   Magnesium     Status: None   Collection Time: 01/30/17  1:00 AM  Result Value Ref Range   Magnesium 1.8 1.7 - 2.4 mg/dL  Phosphorus     Status: Abnormal   Collection Time: 01/30/17  1:00 AM  Result Value Ref Range   Phosphorus 2.0 (L) 2.5 - 4.6 mg/dL  Basic metabolic panel     Status: Abnormal   Collection Time: 01/30/17  1:00 AM  Result Value Ref Range   Sodium 134 (L) 135 - 145 mmol/L   Potassium 2.7 (LL) 3.5 - 5.1 mmol/L    Comment: CRITICAL RESULT CALLED TO, READ BACK BY AND VERIFIED WITH SARAH KLENNER @ 0158 ON 01/30/2017 BY  CAF    Chloride 97 (L) 101 - 111 mmol/L   CO2 29 22 - 32 mmol/L   Glucose, Bld 99 65 - 99 mg/dL   BUN 5 (L) 6 - 20 mg/dL   Creatinine, Ser 0.34 (L) 0.44 - 1.00 mg/dL   Calcium 7.7 (L) 8.9 - 10.3 mg/dL   GFR calc non Af Amer >60 >60 mL/min   GFR calc Af Amer >60 >60 mL/min    Comment: (NOTE) The eGFR has been calculated using the CKD EPI equation. This calculation has not been validated in all clinical situations. eGFR's persistently <60 mL/min signify possible Chronic Kidney Disease.    Anion gap 8 5 - 15  CBC     Status: Abnormal   Collection Time: 01/30/17  1:00 AM  Result Value Ref Range   WBC 18.0 (H) 3.6 - 11.0 K/uL   RBC 2.60 (L) 3.80 - 5.20 MIL/uL   Hemoglobin 7.5 (L) 12.0 - 16.0 g/dL   HCT 22.9 (L) 35.0 - 47.0 %   MCV 88.0 80.0 - 100.0 fL   MCH 28.7 26.0 - 34.0 pg   MCHC 32.6 32.0 - 36.0 g/dL   RDW 16.9 (H) 11.5 - 14.5 %   Platelets 630 (H) 150 - 440 K/uL  MRSA PCR Screening     Status: None   Collection Time: 01/30/17  9:27 AM  Result Value Ref Range   MRSA by PCR NEGATIVE NEGATIVE    Comment:        The GeneXpert MRSA Assay (FDA approved for NASAL specimens only), is one component of a comprehensive MRSA colonization surveillance program. It is not intended to diagnose MRSA infection nor to guide or monitor treatment for MRSA infections.    No components found for: ESR, C REACTIVE PROTEIN MICRO: Recent Results (from the past 720 hour(s))  Aerobic/Anaerobic Culture (surgical/deep wound)     Status: None   Collection Time: 01/05/17  3:35 PM  Result Value Ref Range Status   Specimen Description FLUID ABCESS  Final   Special Requests NONE  Final   Gram Stain   Final    ABUNDANT WBC PRESENT, PREDOMINANTLY PMN NO ORGANISMS SEEN    Culture   Final    No growth aerobically or anaerobically. Performed at [facility], [place] [address], [place], [place] [address]    Report Status 01/10/2017 FINAL  Final  Urine culture     Status: None   Collection  Time: 01/21/17 12:13 PM  Result Value Ref Range Status   Specimen Description URINE, RANDOM  Final   Special Requests NONE  Final   Culture   Final    NO GROWTH Performed at [facility], [place] [address] [place], [place] [address]    Report Status 01/22/2017 FINAL  Final  Culture, blood (Routine x 2)     Status: None   Collection Time: 01/21/17 12:23 PM  Result Value Ref Range Status   Specimen Description BLOOD LEFT FOREARM  Final   Special Requests BOTTLES DRAWN AEROBIC AND ANAEROBIC BCAV  Final   Culture NO GROWTH 5 DAYS  Final   Report Status 01/26/2017 FINAL  Final  Culture, blood (Routine x 2)     Status: None   Collection Time: 01/21/17 12:36 PM  Result Value Ref Range Status   Specimen Description BLOOD RIGHT HAND  Final   Special Requests   Final    BOTTLES DRAWN AEROBIC AND ANAEROBIC Blood Culture adequate volume   Culture NO GROWTH 5 DAYS  Final   Report Status 01/26/2017 FINAL  Final  Culture, blood (Routine x 2)     Status: None (Preliminary result)   Collection Time: 01/29/17  5:14 PM  Result Value Ref Range Status   Specimen Description BLOOD RAC  Final   Special Requests   Final    BOTTLES DRAWN AEROBIC AND ANAEROBIC Blood Culture adequate volume   Culture NO GROWTH < 24 HOURS  Final   Report Status PENDING  Incomplete  Culture, blood (Routine x 2)     Status: None (Preliminary result)   Collection Time: 01/29/17  5:14 PM  Result Value Ref Range Status   Specimen Description BLOOD LARM  Final   Special Requests   Final    BOTTLES DRAWN AEROBIC AND ANAEROBIC Blood Culture adequate volume   Culture NO GROWTH < 24 HOURS  Final   Report Status PENDING  Incomplete  MRSA PCR Screening     Status: None   Collection Time: 01/30/17  9:27 AM  Result Value Ref Range Status   MRSA by PCR NEGATIVE NEGATIVE Final    Comment:        The GeneXpert MRSA Assay (FDA approved for NASAL specimens only), is one component of a comprehensive MRSA  colonization surveillance program. It is not intended to diagnose MRSA infection nor to guide or monitor treatment for MRSA infections.     IMAGING: Dg Chest 1 View  Result Date: 01/23/2017 CLINICAL DATA:  Pre V/Q scan examination. EXAM: CHEST 1 VIEW COMPARISON:  CT scan chest of Dec 30, 2016 FINDINGS: The lungs are adequately inflated. There is subtle nodularity within both lungs with the largest nodule noted in the rib right upper lung measuring just under 2 cm in greatest dimension. There is no alveolar infiltrate. A trace of pleural fluid at the left lung base laterally is suspected. The heart is top-normal in size. The pulmonary vascularity is normal. There is soft tissue fullness in the right hilar region. The mediastinum is normal in width. IMPRESSION: Subtle pulmonary nodules bilaterally worrisome for metastatic disease. No discrete pneumonia. Small left pleural effusion. Probable right hilar lymphadenopathy. Electronically Signed   By: David  Martinique M.D.   On: 01/23/2017 12:44   Dg Chest 2 View  Result Date: 01/29/2017 CLINICAL DATA:  51 year old with metastatic colon cancer, presenting with fever. Current history of pulmonary emboli. EXAM: CHEST  2 VIEW COMPARISON:  CTA chest 01/23/2017.  Chest x-ray 01/23/2017. FINDINGS: AP semi-erect and lateral images were obtained. Cardiomediastinal silhouette unremarkable, unchanged. Multiple bilateral lung nodules as identified on the 01/23/2017 examinations. Mild atelectasis  in the lower lobes as noted on the prior CT. No new pulmonary parenchymal abnormalities. No visible pleural effusions. Left arm PICC tip in the lower SVC. IMPRESSION: 1. Stable atelectasis involving the lower lobes since the CT 01/23/2017. 2. Multiple bilateral pulmonary metastases as noted on the CT. 3. No new/acute cardiopulmonary abnormalities. Electronically Signed   By: Evangeline Dakin M.D.   On: 01/29/2017 18:17   Ct Angio Chest Pe W Or Wo Contrast  Result Date:  01/29/2017 CLINICAL DATA:  Fever, tachycardia. EXAM: CT ANGIOGRAPHY CHEST CT ABDOMEN AND PELVIS WITH CONTRAST TECHNIQUE: Multidetector CT imaging of the chest was performed using the standard protocol during bolus administration of intravenous contrast. Multiplanar CT image reconstructions and MIPs were obtained to evaluate the vascular anatomy. Multidetector CT imaging of the abdomen and pelvis was performed using the standard protocol during bolus administration of intravenous contrast. CONTRAST:  100 mL of Isovue 370 intravenously. COMPARISON:  CT scan of Jan 21, 2017. FINDINGS: CTA CHEST FINDINGS Cardiovascular: Satisfactory opacification of the pulmonary arteries to the segmental level. No evidence of pulmonary embolism. Normal heart size. No pericardial effusion. Mediastinum/Nodes: No enlarged mediastinal, hilar, or axillary lymph nodes. Thyroid gland, trachea, and esophagus demonstrate no significant findings. Lungs/Pleura: Multiple nodular densities are noted throughout both lungs consistent with metastatic disease. Minimal bibasilar subsegmental atelectasis is noted. No pneumothorax or pleural effusion is noted. Musculoskeletal: No chest wall abnormality. No acute or significant osseous findings. Review of the MIP images confirms the above findings. CT ABDOMEN and PELVIS FINDINGS Hepatobiliary: The gallstones are noted. Stable hepatic lesions are noted consistent with metastatic disease. Pancreas: Unremarkable. No pancreatic ductal dilatation or surrounding inflammatory changes. Spleen: Normal in size without focal abnormality. Adrenals/Urinary Tract: Adrenal glands appear normal. No hydronephrosis or renal obstruction is noted. No renal or ureteral calculi are noted. Urinary bladder is unremarkable. Stable complex low density is noted posteriorly in the left renal cortex concerning for infection, infarction or possibly metastatic disease. Stomach/Bowel: There is no evidence of bowel obstruction. Colostomy  is seen in the left lower quadrant. Vascular/Lymphatic: No significant vascular abnormality is noted. Left periaortic adenopathy is noted consistent with metastatic disease. The largest measures 15 mm. Reproductive: Reportedly, both ovaries have been removed. Probable fibroid uterus is noted. Continued presence of inflammatory changes seen around the uterus in the pelvis with multiple small fluid collections, the largest measuring 33 x 21 mm. These do not appear to be significantly changed compared to prior exam. Other: Surgical drain remains in the subhepatic space. No fluid collection is noted around this drain. Continued and stable presence of pigtail drainage catheter seen in the right lower quadrant with no significant fluid collection noted around it. Midline surgical incision is noted. Musculoskeletal: No acute or significant osseous findings. Review of the MIP images confirms the above findings. IMPRESSION: No definite evidence of pulmonary embolus. Continued presence of multiple pulmonary and hepatic metastatic lesions. Stable complex low density seen posteriorly in left renal cortex concerning for infection, infarction or possibly metastatic lesion. Left periaortic adenopathy is noted with largest lymph node measuring 15 mm. This is consistent with metastatic disease. Continued presence of surgical drain seen in subhepatic space and pigtail drainage catheter in the right lower quadrant, with no significant fluid collection is noted around either catheter. Continued presence of inflammatory changes in the pelvis adjacent to the fibroid uterus, with multiple small complex fluid collections which are not significantly changed compared to prior exam. Electronically Signed   By: Marijo Conception, M.D.  On: 01/29/2017 20:38   Ct Angio Chest Pe W Or Wo Contrast  Result Date: 01/23/2017 CLINICAL DATA:  51 year old with recent diagnosis of metastatic adenocarcinoma of unknown primary, presenting with  tachycardia and sepsis. Patient had colonic perforation with a large intra-abdominal abscess on 12/30/2016 for which she underwent colon resection. Ventilation-perfusion lung scan earlier today indicated high probability of pulmonary embolism. One view chest x-ray obtained earlier today demonstrated bilateral lung nodules. EXAM: CT ANGIOGRAPHY CHEST WITH CONTRAST TECHNIQUE: Multidetector CT imaging of the chest was performed using the standard protocol during bolus administration of intravenous contrast. Multiplanar CT image reconstructions and MIPs were obtained to evaluate the vascular anatomy. CONTRAST:  75 mL Isovue 370 IV. COMPARISON:  CT chest abdomen and pelvis 12/30/2016. Chest X leg and nuclear medicine V/Q scan performed earlier today. FINDINGS: Cardiovascular: Contrast opacification of pulmonary arteries is very good. Filling defects within segmental branches of the right upper lobe and right middle lobe pulmonary arteries. Marked narrowing of the right middle lobe pulmonary artery due to surrounding nodal tissue. Central pulmonary arteries patent. No evidence of right heart strain. Heart size upper normal. No pericardial effusion. No visible coronary atherosclerosis. No visible atherosclerosis involving the thoracic or upper abdominal aorta or their visualized branches. Mediastinum/Nodes: Right hilar lymphadenopathy which surrounds and compresses the right middle lobe pulmonary artery. No pathologic mediastinal, left hilar or axillary lymphadenopathy. Normal appearing esophagus. Thyroid gland normal in appearance. Lungs/Pleura: Numerous bilateral pulmonary nodules. Index right upper lobe nodule measures approximately 1.9 x 1.6 cm, mean 1.8 cm (series 7, image 29). Index lingular nodule adjacent to the major fissure measures approximately 1.7 x 2.2 cm, mean 2.0 cm (series 7, image 59). Peribronchial and possible lymphangitic spread of tumor in the right lung and left lower lobe. Small left pleural  effusion. No right pleural effusion. Dense atelectasis dependently in the lower lobes. Upper Abdomen: Unremarkable for the early arterial phase of enhancement which accounts for the heterogeneous appearance of the spleen. Musculoskeletal: Regional skeleton intact without acute or significant osseous abnormality. Specifically, no evidence of osseous metastatic disease. Review of the MIP images confirms the above findings. IMPRESSION: 1. Acute pulmonary emboli involving segmental branches of the right upper lobe and right middle lobe pulmonary artery. There is no evidence of right heart strain. 2. Right hilar lymphadenopathy which surrounds and narrows the right middle lobe pulmonary artery. 3. Numerous bilateral pulmonary metastases. Index lesions in the right upper lobe and lingula are measured above. 4. Peribronchial and possible lymphangitic spread of tumor in the right lung and in the left lower lobe. 5. Small left pleural effusion. 6. Dependent dense atelectasis in the lower lobes. Electronically Signed   By: Evangeline Dakin M.D.   On: 01/23/2017 14:14   Ct Abdomen Pelvis W Contrast  Result Date: 01/29/2017 CLINICAL DATA:  Fever, tachycardia. EXAM: CT ANGIOGRAPHY CHEST CT ABDOMEN AND PELVIS WITH CONTRAST TECHNIQUE: Multidetector CT imaging of the chest was performed using the standard protocol during bolus administration of intravenous contrast. Multiplanar CT image reconstructions and MIPs were obtained to evaluate the vascular anatomy. Multidetector CT imaging of the abdomen and pelvis was performed using the standard protocol during bolus administration of intravenous contrast. CONTRAST:  100 mL of Isovue 370 intravenously. COMPARISON:  CT scan of Jan 21, 2017. FINDINGS: CTA CHEST FINDINGS Cardiovascular: Satisfactory opacification of the pulmonary arteries to the segmental level. No evidence of pulmonary embolism. Normal heart size. No pericardial effusion. Mediastinum/Nodes: No enlarged mediastinal,  hilar, or axillary lymph nodes. Thyroid gland, trachea,  and esophagus demonstrate no significant findings. Lungs/Pleura: Multiple nodular densities are noted throughout both lungs consistent with metastatic disease. Minimal bibasilar subsegmental atelectasis is noted. No pneumothorax or pleural effusion is noted. Musculoskeletal: No chest wall abnormality. No acute or significant osseous findings. Review of the MIP images confirms the above findings. CT ABDOMEN and PELVIS FINDINGS Hepatobiliary: The gallstones are noted. Stable hepatic lesions are noted consistent with metastatic disease. Pancreas: Unremarkable. No pancreatic ductal dilatation or surrounding inflammatory changes. Spleen: Normal in size without focal abnormality. Adrenals/Urinary Tract: Adrenal glands appear normal. No hydronephrosis or renal obstruction is noted. No renal or ureteral calculi are noted. Urinary bladder is unremarkable. Stable complex low density is noted posteriorly in the left renal cortex concerning for infection, infarction or possibly metastatic disease. Stomach/Bowel: There is no evidence of bowel obstruction. 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 Vascular/Lymphatic: No significant vascular abnormality is noted. Left periaortic adenopathy is noted consistent with metastatic disease. The largest measures 15 mm. Reproductive: Reportedly, both ovaries have been removed. Probable fibroid uterus is noted. Continued presence of inflammatory changes seen around the uterus in the pelvis with multiple small fluid collections, the largest measuring 33 x 21 mm. These do not appear to be significantly changed compared to prior exam. Other: Surgical drain remains in the subhepatic space. No fluid collection is noted around this drain. Continued and stable presence of pigtail drainage catheter seen in the right lower quadrant with no significant fluid collection noted around it. Midline surgical incision is noted. Musculoskeletal: No  acute or significant osseous findings. Review of the MIP images confirms the above findings. IMPRESSION: No definite evidence of pulmonary embolus. Continued presence of multiple pulmonary and hepatic metastatic lesions. Stable complex low density seen posteriorly in left renal cortex concerning for infection, infarction or possibly metastatic lesion. Left periaortic adenopathy is noted with largest lymph node measuring 15 mm. This is consistent with metastatic disease. Continued presence of surgical drain seen in subhepatic space and pigtail drainage catheter in the right lower quadrant, with no significant fluid collection is noted around either catheter. Continued presence of inflammatory changes in the pelvis adjacent to the fibroid uterus, with multiple small complex fluid collections which are not significantly changed compared to prior exam. Electronically Signed   By: Marijo Conception, M.D.   On: 01/29/2017 20:38   Ct Abdomen Pelvis W Contrast  Result Date: 01/21/2017 CLINICAL DATA:  Tachycardia and leukocytosis with history of intra-abdominal abscess after rupture of bowel. Positive for cancer although etiology is unknown. EXAM: CT ABDOMEN AND PELVIS WITH CONTRAST TECHNIQUE: Multidetector CT imaging of the abdomen and pelvis was performed using the standard protocol following bolus administration of intravenous contrast. CONTRAST:  163m ISOVUE-300 IOPAMIDOL (ISOVUE-300) INJECTION 61% COMPARISON:  01/08/2017 FINDINGS: Lower chest: Interval increase in size and number of pulmonary nodular opacities within the visualized lung bases, example series 4, image 11 in the right middle lobe now measuring 11 mm versus 5 mm on prior exam. No effusion or pneumothorax. Normal visualized cardiac chamber size. No pericardial effusion. Hepatobiliary: Interval increase in size and number of peripherally enhancing hepatic lesions, new adjacent to the gallbladder fossa on series 2, image 30 measuring 10 mm and slightly  larger dominant lesion in the left hepatic lobe estimated at 17 x 13 mm versus approximately 14 mm previously. Gallbladder is physiologically distended without calculi. Minimal intrahepatic ductal dilatation in the left hepatic lobe adjacent to the dominant presumed metastatic lesion. Adjacent to the right hepatic lobe along its  caudal aspect is a 16 mm circumscribed hypodensity, smaller in appearance than on prior which had measured approximately 21 mm in diameter. Small abscess or necrotic metastasis might account for this appearance, series 2, image 40. Pancreas: Unremarkable. No pancreatic ductal dilatation or surrounding inflammatory changes. Spleen: Normal in size without focal abnormality. Adrenals/Urinary Tract: Normal bilateral adrenal glands and right kidney. New hypodensity in the interpolar aspect of the left kidney measuring 14 x 12 mm. Differential possibilities may include a renal neoplasm, focus of infection, metastasis or possibly infarct, series 2, image 32. No nephrolithiasis nor obstructive uropathy. The urinary bladder is physiologically distended. Stomach/Bowel: Physiologic distention of the stomach. Normal small bowel rotation. Left lower quadrant colostomy. Status post partial resection sigmoid. Vascular/Lymphatic: Small mesenteric nodules are noted in the mid pelvis measuring up to 7 mm short axis and may represent reactive or metastatic adenopathy. The aorta and iliac vessels are unremarkable. Reproductive: Heterogeneous enhancement of uterus consistent with fibroids. Reportly, both ovaries have been removed. Other: Inflammatory/phlegmonous change about the uterus at site of prior pelvic abscesses. These collections are more thick-walled in appearance and minimally larger on the right measuring 30 x 14 mm versus 30 x 10 mm previously. 11 x 5 mm left-sided thick-walled abscess versus 24 x 15 mm previously. Percutaneous pigtail drainage catheter from right lower quadrant approach is noted  coiled in the right hemipelvis anteriorly as before. Musculoskeletal: No acute or significant osseous findings. IMPRESSION: 1. Interval increase in size and number of pulmonary and hepatic lesions consistent with worsening metastatic disease. 2. New ill-defined 14 x 12 mm interpolar hypodensity associated with the left kidney which may reflect a focus of infection, infarct, or neoplasm/metastasis. 3. Phlegmonous change in the pelvis adjacent to the uterus with slightly thicker walled presumed abscess collections minimally larger on the right and smaller on the left. A pigtail percutaneous drainage catheter is seen just anterior to the larger right-sided collection. 4. Smaller circumscribed subhepatic 16 mm hypodensity versus 21 mm previously potentially representing a small abscess or necrotic metastasis. 5. Small subcentimeter mesenteric lymph nodes are noted as above described measuring up to 7 mm. Reactive versus metastatic. Electronically Signed   By: Ashley Royalty M.D.   On: 01/21/2017 15:37   Ct Abdomen Pelvis W Contrast  Result Date: 01/08/2017 CLINICAL DATA:  Diverticular abscess. EXAM: CT ABDOMEN AND PELVIS WITH CONTRAST TECHNIQUE: Multidetector CT imaging of the abdomen and pelvis was performed using the standard protocol following bolus administration of intravenous contrast. CONTRAST:  43m ISOVUE-300 IOPAMIDOL (ISOVUE-300) INJECTION 61% COMPARISON:  CT scan of Jan 05, 2017. FINDINGS: Lower chest: Bibasilar pulmonary nodules are noted concerning for metastatic disease. Mild bibasilar subsegmental atelectasis is noted as well. Hepatobiliary: Probable sludge is noted within the gallbladder. Stable 1.4 cm low density with irregular margins is noted in the left hepatic lobe which is not changed compared to prior exam. This is concerning for possible metastatic focus. Pancreas: Unremarkable. No pancreatic ductal dilatation or surrounding inflammatory changes. Spleen: Normal in size without focal  abnormality. Adrenals/Urinary Tract: Adrenal glands are unremarkable. Kidneys are normal, without renal calculi, focal lesion, or hydronephrosis. Bladder is unremarkable. Stomach/Bowel: Visualized esophagus and stomach appear normal. Mildly dilated proximal small bowel loops are noted concerning for ileus. 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 Status post partial resection of sigmoid colon. Vascular/Lymphatic: No significant vascular findings are present. No enlarged abdominal or pelvic lymph nodes. Reproductive: Multiple small uterine fibroids are noted. Reportedly both ovaries have been removed. Other: There is been interval  placement of percutaneous drain into pelvic abscess described on prior exam. The abscess is significantly decreased in size, although it measures 4.3 x 1.8 cm superiorly with 2.9 x 1.0 cm fluid collections seen to the right of the uterus and 2.4 x 1.5 cm fluid collection seen to the left of the uterus. Stable position of surgical drain is seen entering the right anterior abdominal wall, with distal tip in the right lower quadrant. Musculoskeletal: No acute or significant osseous findings. IMPRESSION: Bibasilar pulmonary nodules are again noted concerning for metastatic disease. Probable sludge seen within the gallbladder. Stable 1.4 cm low density noted in left hepatic lobe concerning for possible metastatic disease. 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 Interval placement of percutaneous drain into the pelvic abscess described on prior exam. This abscess is significantly smaller compared to prior exam. Stable position of surgical drain seen involving right side of abdomen. Electronically Signed   By: Marijo Conception, M.D.   On: 01/08/2017 09:08   Ct Abdomen Pelvis W Contrast  Result Date: 01/05/2017 CLINICAL DATA:  History of adenocarcinoma. Recent perforation of the sigmoid colon with subsequent surgery. Leukocytosis. EXAM: CT ABDOMEN AND PELVIS WITH CONTRAST TECHNIQUE:  Multidetector CT imaging of the abdomen and pelvis was performed using the standard protocol following bolus administration of intravenous contrast. CONTRAST:  139m ISOVUE-300 IOPAMIDOL (ISOVUE-300) INJECTION 61%, <See Chart> ISOVUE-300 IOPAMIDOL (ISOVUE-300) INJECTION 61% COMPARISON:  Dec 30, 2016 FINDINGS: Lower chest: Pulmonary nodularity again identified, suspicious for metastatic disease given history. There is a small left pleural effusion. Bibasilar opacities are likely atelectasis. No other acute abnormalities in the lung bases. Hepatobiliary: A low-attenuation lesion in the left hepatic lobe measures 14 mm, unchanged. A cyst is seen along the inferior right hepatic lobe. No other definitive solid masses are seen within the liver. The portal vein is patent. The gallbladder is unremarkable. No other abnormalities in the liver. Pancreas: Unremarkable. No pancreatic ductal dilatation or surrounding inflammatory changes. Spleen: Normal in size without focal abnormality. Adrenals/Urinary Tract: The adrenal glands, kidneys, and ureters are normal. Air in the bladder is likely from recent catheterization. Stomach/Bowel: The distal esophagus and stomach are normal. Small bowel loops are mildly prominent but not grossly dilated. Contrast does not reach the terminal ileum but does traverse the small bowel into the region of the mid ileum. The patient is status post partial colonic resection. There is gas in the colon to the level of the left lower quadrant ostomy. Vascular/Lymphatic: The abdominal aorta is normal in caliber. Shotty nodes in the retroperitoneum are again identified. The previously identified 12 mm node to the left of the aorta on image 35 is unchanged. No new adenopathy identified. Reproductive: Fibroids in the uterus. By report, the ovaries have been removed. Other: There is free fluid within the abdomen and pelvis which could be postoperative. There is an organized collection of fluid as seen on  axial image 73. There is peripheral enhancement as well some air within this collection. The main body of this collection measures 10.4 by 5.5 by 4.7 cm. A smaller collection of fluid is seen in the bowel mesenteries on series 2, image 53 measuring 4.7 by 2.1 cm. This is not nearly as organized and could simply be postoperative. No other definitive organized collections are seen. There is free air scattered throughout the abdomen and pelvis consistent with the recent postoperative status. Musculoskeletal: No acute changes. IMPRESSION: 1. There is an organizing collection of fluid measuring 10 x 6 x 5 cm in  the pelvis. There is peripheral enhancement around this collection as well some air in the collection. Given history, the findings are suspicious for developing abscess. A smaller collection in the bowel mesentary is less specific and could simply be postoperative. 2. Pulmonary nodule suspicious for metastatic disease. 3. Low-attenuation lesion in the left hepatic lobe is nonspecific. However, given history, a metastatic lesion could have this appearance. 4. Probable mild ileus.  No definite obstruction. 5. Shotty nodes in the retroperitoneum could be reactive. However, given history, it would be difficult to completely exclude metastatic disease. These results will be called to the ordering clinician or representative by the Radiologist Assistant, and communication documented in the PACS or zVision Dashboard. Electronically Signed   By: Dorise Bullion III M.D   On: 01/05/2017 12:26   Nm Pulmonary Perf And Vent  Result Date: 01/23/2017 CLINICAL DATA:  Tachycardia and sepsis.  Recent surgeries. EXAM: NUCLEAR MEDICINE VENTILATION - PERFUSION LUNG SCAN TECHNIQUE: Ventilation images were obtained in multiple projections using inhaled aerosol Tc-2mDTPA. Perfusion images were obtained in multiple projections after intravenous injection of Tc-974mAA. RADIOPHARMACEUTICALS:  34.2 MCi Technetium-9941mPA aerosol  inhalation and 4.4 mCi Technetium-45m21m IV COMPARISON:  Chest radiograph from today FINDINGS: Ventilation: No focal ventilation defect. Perfusion: There are 2 large segmental perfusion defects involving the right upper and lower lobe. Within the superior segment of left lower lobe there is a moderate size segmental perfusion defect. IMPRESSION: High probability for acute pulmonary embolus. Electronically Signed   By: TaylKerby Moors.   On: 01/23/2017 12:22   Us VKoreaous Img Lower Bilateral  Result Date: 01/23/2017 CLINICAL DATA:  Acute pulmonary emboli.  Evaluate for source. EXAM: BILATERAL LOWER EXTREMITY VENOUS DOPPLER ULTRASOUND TECHNIQUE: Gray-scale sonography with graded compression, as well as color Doppler and duplex ultrasound were performed to evaluate the lower extremity deep venous systems from the level of the common femoral vein and including the common femoral, femoral, profunda femoral, popliteal and calf veins including the posterior tibial, peroneal and gastrocnemius veins when visible. The superficial great saphenous vein was also interrogated. Spectral Doppler was utilized to evaluate flow at rest and with distal augmentation maneuvers in the common femoral, femoral and popliteal veins. COMPARISON:  None. FINDINGS: RIGHT LOWER EXTREMITY Common Femoral Vein: No evidence of thrombus. Normal compressibility, respiratory phasicity and response to augmentation. Saphenofemoral Junction: No evidence of thrombus. Normal compressibility and flow on color Doppler imaging. Profunda Femoral Vein: No evidence of thrombus. Normal compressibility and flow on color Doppler imaging. Femoral Vein: No evidence of thrombus. Normal compressibility, respiratory phasicity and response to augmentation. Popliteal Vein: No evidence of thrombus. Normal compressibility, respiratory phasicity and response to augmentation. Calf Veins: No evidence of thrombus. Normal compressibility and flow on color Doppler imaging.  Superficial Great Saphenous Vein: Not evaluated. Venous Reflux:  Not evaluated. Other Findings:  None. LEFT LOWER EXTREMITY Common Femoral Vein: No evidence of thrombus. Normal compressibility, respiratory phasicity and response to augmentation. Saphenofemoral Junction: No evidence of thrombus. Normal compressibility and flow on color Doppler imaging. Profunda Femoral Vein: No evidence of thrombus. Normal compressibility and flow on color Doppler imaging. Femoral Vein: No evidence of thrombus. Normal compressibility, respiratory phasicity and response to augmentation. Popliteal Vein: No evidence of thrombus. Normal compressibility, respiratory phasicity and response to augmentation. Calf Veins: Occlusive thrombus within one of the paired posterior tibial veins. The paired peroneal veins are patent. Superficial Great Saphenous Vein: Not evaluated. Venous Reflux:  Not evaluated. Other Findings: Left popliteal fossa cyst measuring  approximately 4.6 x 1.2 x 1.9 cm. IMPRESSION: 1. Occlusive thrombus involving one of the paired left posterior tibial veins. 2. No evidence of DVT elsewhere in the left lower extremity. 3. No evidence of right lower extremity DVT. 4. Baker's cyst involving the left popliteal fossa, measured above. Electronically Signed   By: Evangeline Dakin M.D.   On: 01/23/2017 20:17   Ct Image Guided Drainage By Percutaneous Catheter  Result Date: 01/05/2017 INDICATION: 51 year old female with a peripherally enhancing fluid collection and increasing leukocytosis status post sigmoid colon resection, left lower quadrant colostomy creation and Hartmann's pouch procedure for perforated sigmoid colon. Imaging findings are concerning for postoperative abscess. EXAM: CT GUIDED DRAINAGE OF right lower quadrant intraperitoneal abdominal ABSCESS MEDICATIONS: The patient is currently admitted to the hospital and receiving intravenous antibiotics. The antibiotics were administered within an appropriate time frame  prior to the initiation of the procedure. ANESTHESIA/SEDATION: 2 mg IV Versed 100 mcg IV Fentanyl Moderate Sedation Time:  20 minutes The patient was continuously monitored during the procedure by the interventional radiology nurse under my direct supervision. COMPLICATIONS: None immediate. TECHNIQUE: Informed written consent was obtained from the patient after a thorough discussion of the procedural risks, benefits and alternatives. All questions were addressed. Maximal Sterile Barrier Technique was utilized including caps, mask, sterile gowns, sterile gloves, sterile drape, hand hygiene and skin antiseptic. A timeout was performed prior to the initiation of the procedure. PROCEDURE: The operative field was prepped with Chlorhexidine in a sterile fashion, and a sterile drape was applied covering the operative field. A sterile gown and sterile gloves were used for the procedure. Local anesthesia was provided with 1% Lidocaine. A small dermatotomy was made. Using intermittent CT guidance, an 18 gauge trocar needle was advanced through the right lower quadrant abdominal wall taking care to avoid the inferior epigastric vessels. The needle was advanced into the fluid collection. An Amplatz wire was then advanced the needle and coiled within the fluid collection. The needle was removed. The skin tract was dilated to 66 [place] and a Cook 12 [place] all-purpose drainage catheter was advanced over the wire and formed in the fluid collection. Aspiration yields approximately 40 mL of turbid serosanguineous fluid containing internal debris. A sample was sent to the laboratory for culture. Post drain placement axial CT imaging demonstrates excellent position of the drainage catheter and near complete resolution of the fluid collection. The catheter was secured to the skin with 0 Prolene suture and a sterile bandage. The patient tolerated the procedure well. FINDINGS: Approximately 40 mL turbid serosanguineous fluid with  internal debris. IMPRESSION: Technically successful placement of a 75 French percutaneous drain into the right lower quadrant postoperative fluid collection with aspiration as described above. Maintain tube to JP bulb suction. When drain output is minimal, recommend repeat CT scan of the pelvis with intravenous contrast prior to drain removal. Electronically Signed   By: Jacqulynn Cadet M.D.   On: 01/05/2017 16:00    Assessment:   Anna [name] is a 51 y.o. female with recent dx of metastatic adenocarcinoma of unknown primary , s.p colonic perforation and Hartmanns pouch procedure May 8 complicated by intra-abdominal abscess. May 8 cx grew E coli, Eikenella, viridans strep.  May 13 had placement of drain with findings of 40 ML fluid and debris - cx negative.  Dced initially on cipro and flagyl but had worsening wbc up to 24 K so readmitted 5/29-6/1. CT done showed worsening metastatic disease, phlegmon in pelvis with presummed abscess collections with  pigtail cath in place. Started on cefepime but developed a rash but was able to tolerate meropenem. We placed picc and she was dced on IV meropenem Now readmitted with fevers and peristently elevated WBC. Anna [name] messaged Dr Dahlia Byes because otpt home health labs showed wbc 20 and plts elevated in 700s. Plan was to repeat a CT.  CT has been done this admission and still shows pelvic inflamm changes and multiple small complex fluid collections unchanged.  She currently denies abd pain, fevers improving. PICC working well Received vanco last pm and had rash and redmans. She had no rash at home with them meropenem.  At this point I think her persistent leukocytosis, thrombocytosis, tachycardia and fevers suggest ongoing abscess collection in pelvic area. I think if possible it would be best to try to place drains to see if we can improve the fluid collection.  Surgery will see today.   Recommendations Continue meropenem Add antifungal coverage with  fluconazole Await surg opinion regarding placement of new or repositioning of old drains.   I think given the rapid progression of the tumors I would not delay immunotherapy until this infection is healed as will take several weeks and may progress to needing repeat drainage whether or not she gets immunotherapy.  Thank you very much for allowing me to participate in the care of this patient. Please call with questions.   Cheral Marker. Ola Spurr, MD

## 2017-01-30 NOTE — Progress Notes (Deleted)
[place]  [phone] Fax:(336) [phone]  ID: Anna [name] OB: 30-Oct-1965  MR#: [id number]  VFI#:[id number]  Patient Care Team: McLean-Scocuzza, Nino Glow, MD as PCP - General (Internal Medicine)  CHIEF COMPLAINT: ***  INTERVAL HISTORY: ***  REVIEW OF SYSTEMS:   ROS  As per HPI. Otherwise, a complete review of systems is negative.  PAST MEDICAL HISTORY: Past Medical History:  Diagnosis Date  . Cancer ([place])   . Cancer with unknown primary site [facility])   . Colon polyp   . Diverticulosis   . Fibroid uterus   . Heme positive stool 08/01/2014  . HH (hiatus hernia)   . Large bowel perforation ([place]) 12/31/2016  . Metastatic adenocarcinoma ([place]) 12/20/2016  . Pelvic pain in female 01/13/2017  . Perforation of sigmoid colon ([place])     PAST SURGICAL HISTORY: Past Surgical History:  Procedure Laterality Date  . COLONOSCOPY    . CYSTOSCOPY  12/20/2016   Procedure: CYSTOSCOPY;  Surgeon: Boykin Nearing, MD;  Location: [facility] ORS;  Service: Gynecology;;  . ESOPHAGOGASTRODUODENOSCOPY    . LAPAROSCOPIC BILATERAL SALPINGECTOMY Bilateral 12/20/2016   Procedure: LAPAROSCOPIC BILATERAL SALPINGOOPHERECTOMY,  Excision of anterior 5 cm calcified uterine fibroid;  Surgeon: Boykin Nearing, MD;  Location: [facility] ORS;  Service: Gynecology;  Laterality: Bilateral;  . LAPAROSCOPIC LYSIS OF ADHESIONS  12/20/2016   Procedure: LAPAROSCOPIC LYSIS OF ADHESIONS WITH DISSECTION OF LEFT TUBE AND OVARY.;  Surgeon: Olean Ree, MD;  Location: [facility] ORS;  Service: General;;  . LAPAROTOMY N/A 12/31/2016   Procedure: EXPLORATORY LAPAROTOMY, sigmoid colectomy, removal of omentum, colostomy;  Surgeon: Jules Husbands, MD;  Location: [facility] ORS;  Service: General;  Laterality: N/A;  . SIGMOIDOSCOPY  12/20/2016   Procedure: RIGID SIGMOIDOSCOPY;  Surgeon: Olean Ree, MD;  Location: [facility] ORS;  Service: General;;  . TONSILLECTOMY      FAMILY HISTORY: Family History  Problem  Relation Age of Onset  . Colon cancer Mother 33  . Diabetes Mother   . Asthma Father   . Breast cancer Neg Hx     ADVANCED DIRECTIVES (Y/N):  N  HEALTH MAINTENANCE: Social History  Substance Use Topics  . Smoking status: Never Smoker  . Smokeless tobacco: Never Used  . Alcohol use No     Colonoscopy:  PAP:  Bone density:  Lipid panel:  Allergies  Allergen Reactions  . Cefepime Rash  . Penicillins Rash    As a child. Has patient had a PCN reaction causing immediate rash, facial/tongue/throat swelling, SOB or lightheadedness with hypotension: Unknown Has patient had a PCN reaction causing severe rash involving mucus membranes or skin necrosis: Unknown Has patient had a PCN reaction that required hospitalization: No Has patient had a PCN reaction occurring within the last 10 years: No If all of the above answers are "NO", then may proceed with Cephalosporin use.     No current facility-administered medications for this visit.    No current outpatient prescriptions on file.   Facility-Administered Medications Ordered in Other Visits  Medication Dose Route Frequency Provider Last Rate Last Dose  . 0.9 %  sodium chloride infusion   Intravenous Continuous Hugelmeyer, Alexis, DO      . acetaminophen (TYLENOL) tablet 650 mg  650 mg Oral Q6H PRN Hugelmeyer, Alexis, DO       Or  . acetaminophen (TYLENOL) suppository 650 mg  650 mg Rectal Q6H PRN Hugelmeyer, Alexis, DO      . albuterol (PROVENTIL) (2.5 MG/3ML) 0.083% nebulizer solution 2.5 mg  2.5 mg Nebulization Q6H PRN Hugelmeyer, Alexis, DO      . diphenhydrAMINE (BENADRYL) capsule 25 mg  25 mg Oral Q6H PRN Hugelmeyer, Alexis, DO      . enoxaparin (LOVENOX) injection 40 mg  40 mg Subcutaneous Q24H Hugelmeyer, Alexis, DO      . ipratropium (ATROVENT) nebulizer solution 0.5 mg  0.5 mg Nebulization Q6H PRN Hugelmeyer, Alexis, DO      . metoprolol succinate (TOPROL-XL) 24 hr tablet 50 mg  50 mg Oral Once Hugelmeyer, Alexis, DO       . morphine 2 MG/ML injection 1 mg  1 mg Intravenous Q4H PRN Hugelmeyer, Alexis, DO      . ondansetron (ZOFRAN) tablet 4 mg  4 mg Oral Q6H PRN Hugelmeyer, Alexis, DO       Or  . ondansetron (ZOFRAN) injection 4 mg  4 mg Intravenous Q6H PRN Hugelmeyer, Alexis, DO      . oxyCODONE-acetaminophen (PERCOCET/ROXICET) 5-325 MG per tablet 1 tablet  1 tablet Oral Q6H PRN Hugelmeyer, Alexis, DO        OBJECTIVE: There were no vitals filed for this visit.   There is no height or weight on file to calculate BMI.    ECOG FS:{CHL ONC [id number]  General: Well-developed, well-nourished, no acute distress. Eyes: Pink conjunctiva, anicteric sclera. HEENT: Normocephalic, moist mucous membranes, clear oropharnyx. Lungs: Clear to auscultation bilaterally. Heart: Regular rate and rhythm. No rubs, murmurs, or gallops. Abdomen: Soft, nontender, nondistended. No organomegaly noted, normoactive bowel sounds. Musculoskeletal: No edema, cyanosis, or clubbing. Neuro: Alert, answering all questions appropriately. Cranial nerves grossly intact. Skin: No rashes or petechiae noted. Psych: Normal affect. Lymphatics: No cervical, calvicular, axillary or inguinal LAD.   LAB RESULTS:  Lab Results  Component Value Date   NA 132 (L) 01/29/2017   K 3.1 (L) 01/29/2017   CL 93 (L) 01/29/2017   CO2 30 01/29/2017   GLUCOSE 96 01/29/2017   BUN 11 01/29/2017   CREATININE 0.38 (L) 01/29/2017   CALCIUM 8.0 (L) 01/29/2017   PROT 6.1 (L) 01/29/2017   ALBUMIN 2.5 (L) 01/29/2017   AST 78 (H) 01/29/2017   ALT 62 (H) 01/29/2017   ALKPHOS 90 01/29/2017   BILITOT 0.7 01/29/2017   GFRNONAA >60 01/29/2017   GFRAA >60 01/29/2017    Lab Results  Component Value Date   WBC 19.4 (H) 01/29/2017   NEUTROABS 17.3 (H) 01/29/2017   HGB 8.7 (L) 01/29/2017   HCT 26.7 (L) 01/29/2017   MCV 87.8 01/29/2017   PLT 627 (H) 01/29/2017     STUDIES: Dg Chest 1 View  Result Date: 01/23/2017 CLINICAL DATA:  Pre V/Q scan  examination. EXAM: CHEST 1 VIEW COMPARISON:  CT scan chest of Dec 30, 2016 FINDINGS: The lungs are adequately inflated. There is subtle nodularity within both lungs with the largest nodule noted in the rib right upper lung measuring just under 2 cm in greatest dimension. There is no alveolar infiltrate. A trace of pleural fluid at the left lung base laterally is suspected. The heart is top-normal in size. The pulmonary vascularity is normal. There is soft tissue fullness in the right hilar region. The mediastinum is normal in width. IMPRESSION: Subtle pulmonary nodules bilaterally worrisome for metastatic disease. No discrete pneumonia. Small left pleural effusion. Probable right hilar lymphadenopathy. Electronically Signed   By: David  Martinique M.D.   On: 01/23/2017 12:44   Dg Chest 2 View  Result Date: 01/29/2017 CLINICAL DATA:  51 year old with metastatic colon cancer, presenting with  fever. Current history of pulmonary emboli. EXAM: CHEST  2 VIEW COMPARISON:  CTA chest 01/23/2017.  Chest x-ray 01/23/2017. FINDINGS: AP semi-erect and lateral images were obtained. Cardiomediastinal silhouette unremarkable, unchanged. Multiple bilateral lung nodules as identified on the 01/23/2017 examinations. Mild atelectasis in the lower lobes as noted on the prior CT. No new pulmonary parenchymal abnormalities. No visible pleural effusions. Left arm PICC tip in the lower SVC. IMPRESSION: 1. Stable atelectasis involving the lower lobes since the CT 01/23/2017. 2. Multiple bilateral pulmonary metastases as noted on the CT. 3. No new/acute cardiopulmonary abnormalities. Electronically Signed   By: Evangeline Dakin M.D.   On: 01/29/2017 18:17   Ct Angio Chest Pe W Or Wo Contrast  Result Date: 01/29/2017 CLINICAL DATA:  Fever, tachycardia. EXAM: CT ANGIOGRAPHY CHEST CT ABDOMEN AND PELVIS WITH CONTRAST TECHNIQUE: Multidetector CT imaging of the chest was performed using the standard protocol during bolus administration of  intravenous contrast. Multiplanar CT image reconstructions and MIPs were obtained to evaluate the vascular anatomy. Multidetector CT imaging of the abdomen and pelvis was performed using the standard protocol during bolus administration of intravenous contrast. CONTRAST:  100 mL of Isovue 370 intravenously. COMPARISON:  CT scan of Jan 21, 2017. FINDINGS: CTA CHEST FINDINGS Cardiovascular: Satisfactory opacification of the pulmonary arteries to the segmental level. No evidence of pulmonary embolism. Normal heart size. No pericardial effusion. Mediastinum/Nodes: No enlarged mediastinal, hilar, or axillary lymph nodes. Thyroid gland, trachea, and esophagus demonstrate no significant findings. Lungs/Pleura: Multiple nodular densities are noted throughout both lungs consistent with metastatic disease. Minimal bibasilar subsegmental atelectasis is noted. No pneumothorax or pleural effusion is noted. Musculoskeletal: No chest wall abnormality. No acute or significant osseous findings. Review of the MIP images confirms the above findings. CT ABDOMEN and PELVIS FINDINGS Hepatobiliary: The gallstones are noted. Stable hepatic lesions are noted consistent with metastatic disease. Pancreas: Unremarkable. No pancreatic ductal dilatation or surrounding inflammatory changes. Spleen: Normal in size without focal abnormality. Adrenals/Urinary Tract: Adrenal glands appear normal. No hydronephrosis or renal obstruction is noted. No renal or ureteral calculi are noted. Urinary bladder is unremarkable. Stable complex low density is noted posteriorly in the left renal cortex concerning for infection, infarction or possibly metastatic disease. Stomach/Bowel: There is no evidence of bowel obstruction. 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 Vascular/Lymphatic: No significant vascular abnormality is noted. Left periaortic adenopathy is noted consistent with metastatic disease. The largest measures 15 mm. Reproductive: Reportedly, both  ovaries have been removed. Probable fibroid uterus is noted. Continued presence of inflammatory changes seen around the uterus in the pelvis with multiple small fluid collections, the largest measuring 33 x 21 mm. These do not appear to be significantly changed compared to prior exam. Other: Surgical drain remains in the subhepatic space. No fluid collection is noted around this drain. Continued and stable presence of pigtail drainage catheter seen in the right lower quadrant with no significant fluid collection noted around it. Midline surgical incision is noted. Musculoskeletal: No acute or significant osseous findings. Review of the MIP images confirms the above findings. IMPRESSION: No definite evidence of pulmonary embolus. Continued presence of multiple pulmonary and hepatic metastatic lesions. Stable complex low density seen posteriorly in left renal cortex concerning for infection, infarction or possibly metastatic lesion. Left periaortic adenopathy is noted with largest lymph node measuring 15 mm. This is consistent with metastatic disease. Continued presence of surgical drain seen in subhepatic space and pigtail drainage catheter in the right lower quadrant, with no significant fluid collection  is noted around either catheter. Continued presence of inflammatory changes in the pelvis adjacent to the fibroid uterus, with multiple small complex fluid collections which are not significantly changed compared to prior exam. Electronically Signed   By: Marijo Conception, M.D.   On: 01/29/2017 20:38   Ct Angio Chest Pe W Or Wo Contrast  Result Date: 01/23/2017 CLINICAL DATA:  51 year old with recent diagnosis of metastatic adenocarcinoma of unknown primary, presenting with tachycardia and sepsis. Patient had colonic perforation with a large intra-abdominal abscess on 12/30/2016 for which she underwent colon resection. Ventilation-perfusion lung scan earlier today indicated high probability of pulmonary embolism.  One view chest x-ray obtained earlier today demonstrated bilateral lung nodules. EXAM: CT ANGIOGRAPHY CHEST WITH CONTRAST TECHNIQUE: Multidetector CT imaging of the chest was performed using the standard protocol during bolus administration of intravenous contrast. Multiplanar CT image reconstructions and MIPs were obtained to evaluate the vascular anatomy. CONTRAST:  75 mL Isovue 370 IV. COMPARISON:  CT chest abdomen and pelvis 12/30/2016. Chest X leg and nuclear medicine V/Q scan performed earlier today. FINDINGS: Cardiovascular: Contrast opacification of pulmonary arteries is very good. Filling defects within segmental branches of the right upper lobe and right middle lobe pulmonary arteries. Marked narrowing of the right middle lobe pulmonary artery due to surrounding nodal tissue. Central pulmonary arteries patent. No evidence of right heart strain. Heart size upper normal. No pericardial effusion. No visible coronary atherosclerosis. No visible atherosclerosis involving the thoracic or upper abdominal aorta or their visualized branches. Mediastinum/Nodes: Right hilar lymphadenopathy which surrounds and compresses the right middle lobe pulmonary artery. No pathologic mediastinal, left hilar or axillary lymphadenopathy. Normal appearing esophagus. Thyroid gland normal in appearance. Lungs/Pleura: Numerous bilateral pulmonary nodules. Index right upper lobe nodule measures approximately 1.9 x 1.6 cm, mean 1.8 cm (series 7, image 29). Index lingular nodule adjacent to the major fissure measures approximately 1.7 x 2.2 cm, mean 2.0 cm (series 7, image 59). Peribronchial and possible lymphangitic spread of tumor in the right lung and left lower lobe. Small left pleural effusion. No right pleural effusion. Dense atelectasis dependently in the lower lobes. Upper Abdomen: Unremarkable for the early arterial phase of enhancement which accounts for the heterogeneous appearance of the spleen. Musculoskeletal: Regional  skeleton intact without acute or significant osseous abnormality. Specifically, no evidence of osseous metastatic disease. Review of the MIP images confirms the above findings. IMPRESSION: 1. Acute pulmonary emboli involving segmental branches of the right upper lobe and right middle lobe pulmonary artery. There is no evidence of right heart strain. 2. Right hilar lymphadenopathy which surrounds and narrows the right middle lobe pulmonary artery. 3. Numerous bilateral pulmonary metastases. Index lesions in the right upper lobe and lingula are measured above. 4. Peribronchial and possible lymphangitic spread of tumor in the right lung and in the left lower lobe. 5. Small left pleural effusion. 6. Dependent dense atelectasis in the lower lobes. Electronically Signed   By: Evangeline Dakin M.D.   On: 01/23/2017 14:14   Ct Abdomen Pelvis W Contrast  Result Date: 01/29/2017 CLINICAL DATA:  Fever, tachycardia. EXAM: CT ANGIOGRAPHY CHEST CT ABDOMEN AND PELVIS WITH CONTRAST TECHNIQUE: Multidetector CT imaging of the chest was performed using the standard protocol during bolus administration of intravenous contrast. Multiplanar CT image reconstructions and MIPs were obtained to evaluate the vascular anatomy. Multidetector CT imaging of the abdomen and pelvis was performed using the standard protocol during bolus administration of intravenous contrast. CONTRAST:  100 mL of Isovue 370 intravenously. COMPARISON:  CT scan of Jan 21, 2017. FINDINGS: CTA CHEST FINDINGS Cardiovascular: Satisfactory opacification of the pulmonary arteries to the segmental level. No evidence of pulmonary embolism. Normal heart size. No pericardial effusion. Mediastinum/Nodes: No enlarged mediastinal, hilar, or axillary lymph nodes. Thyroid gland, trachea, and esophagus demonstrate no significant findings. Lungs/Pleura: Multiple nodular densities are noted throughout both lungs consistent with metastatic disease. Minimal bibasilar subsegmental  atelectasis is noted. No pneumothorax or pleural effusion is noted. Musculoskeletal: No chest wall abnormality. No acute or significant osseous findings. Review of the MIP images confirms the above findings. CT ABDOMEN and PELVIS FINDINGS Hepatobiliary: The gallstones are noted. Stable hepatic lesions are noted consistent with metastatic disease. Pancreas: Unremarkable. No pancreatic ductal dilatation or surrounding inflammatory changes. Spleen: Normal in size without focal abnormality. Adrenals/Urinary Tract: Adrenal glands appear normal. No hydronephrosis or renal obstruction is noted. No renal or ureteral calculi are noted. Urinary bladder is unremarkable. Stable complex low density is noted posteriorly in the left renal cortex concerning for infection, infarction or possibly metastatic disease. Stomach/Bowel: There is no evidence of bowel obstruction. 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 Vascular/Lymphatic: No significant vascular abnormality is noted. Left periaortic adenopathy is noted consistent with metastatic disease. The largest measures 15 mm. Reproductive: Reportedly, both ovaries have been removed. Probable fibroid uterus is noted. Continued presence of inflammatory changes seen around the uterus in the pelvis with multiple small fluid collections, the largest measuring 33 x 21 mm. These do not appear to be significantly changed compared to prior exam. Other: Surgical drain remains in the subhepatic space. No fluid collection is noted around this drain. Continued and stable presence of pigtail drainage catheter seen in the right lower quadrant with no significant fluid collection noted around it. Midline surgical incision is noted. Musculoskeletal: No acute or significant osseous findings. Review of the MIP images confirms the above findings. IMPRESSION: No definite evidence of pulmonary embolus. Continued presence of multiple pulmonary and hepatic metastatic lesions. Stable complex low density  seen posteriorly in left renal cortex concerning for infection, infarction or possibly metastatic lesion. Left periaortic adenopathy is noted with largest lymph node measuring 15 mm. This is consistent with metastatic disease. Continued presence of surgical drain seen in subhepatic space and pigtail drainage catheter in the right lower quadrant, with no significant fluid collection is noted around either catheter. Continued presence of inflammatory changes in the pelvis adjacent to the fibroid uterus, with multiple small complex fluid collections which are not significantly changed compared to prior exam. Electronically Signed   By: Marijo Conception, M.D.   On: 01/29/2017 20:38   Ct Abdomen Pelvis W Contrast  Result Date: 01/21/2017 CLINICAL DATA:  Tachycardia and leukocytosis with history of intra-abdominal abscess after rupture of bowel. Positive for cancer although etiology is unknown. EXAM: CT ABDOMEN AND PELVIS WITH CONTRAST TECHNIQUE: Multidetector CT imaging of the abdomen and pelvis was performed using the standard protocol following bolus administration of intravenous contrast. CONTRAST:  163m ISOVUE-300 IOPAMIDOL (ISOVUE-300) INJECTION 61% COMPARISON:  01/08/2017 FINDINGS: Lower chest: Interval increase in size and number of pulmonary nodular opacities within the visualized lung bases, example series 4, image 11 in the right middle lobe now measuring 11 mm versus 5 mm on prior exam. No effusion or pneumothorax. Normal visualized cardiac chamber size. No pericardial effusion. Hepatobiliary: Interval increase in size and number of peripherally enhancing hepatic lesions, new adjacent to the gallbladder fossa on series 2, image 30 measuring 10 mm and slightly larger dominant lesion in the  left hepatic lobe estimated at 17 x 13 mm versus approximately 14 mm previously. Gallbladder is physiologically distended without calculi. Minimal intrahepatic ductal dilatation in the left hepatic lobe adjacent to the  dominant presumed metastatic lesion. Adjacent to the right hepatic lobe along its caudal aspect is a 16 mm circumscribed hypodensity, smaller in appearance than on prior which had measured approximately 21 mm in diameter. Small abscess or necrotic metastasis might account for this appearance, series 2, image 40. Pancreas: Unremarkable. No pancreatic ductal dilatation or surrounding inflammatory changes. Spleen: Normal in size without focal abnormality. Adrenals/Urinary Tract: Normal bilateral adrenal glands and right kidney. New hypodensity in the interpolar aspect of the left kidney measuring 14 x 12 mm. Differential possibilities may include a renal neoplasm, focus of infection, metastasis or possibly infarct, series 2, image 32. No nephrolithiasis nor obstructive uropathy. The urinary bladder is physiologically distended. Stomach/Bowel: Physiologic distention of the stomach. Normal small bowel rotation. Left lower quadrant colostomy. Status post partial resection sigmoid. Vascular/Lymphatic: Small mesenteric nodules are noted in the mid pelvis measuring up to 7 mm short axis and may represent reactive or metastatic adenopathy. The aorta and iliac vessels are unremarkable. Reproductive: Heterogeneous enhancement of uterus consistent with fibroids. Reportly, both ovaries have been removed. Other: Inflammatory/phlegmonous change about the uterus at site of prior pelvic abscesses. These collections are more thick-walled in appearance and minimally larger on the right measuring 30 x 14 mm versus 30 x 10 mm previously. 11 x 5 mm left-sided thick-walled abscess versus 24 x 15 mm previously. Percutaneous pigtail drainage catheter from right lower quadrant approach is noted coiled in the right hemipelvis anteriorly as before. Musculoskeletal: No acute or significant osseous findings. IMPRESSION: 1. Interval increase in size and number of pulmonary and hepatic lesions consistent with worsening metastatic disease. 2. New  ill-defined 14 x 12 mm interpolar hypodensity associated with the left kidney which may reflect a focus of infection, infarct, or neoplasm/metastasis. 3. Phlegmonous change in the pelvis adjacent to the uterus with slightly thicker walled presumed abscess collections minimally larger on the right and smaller on the left. A pigtail percutaneous drainage catheter is seen just anterior to the larger right-sided collection. 4. Smaller circumscribed subhepatic 16 mm hypodensity versus 21 mm previously potentially representing a small abscess or necrotic metastasis. 5. Small subcentimeter mesenteric lymph nodes are noted as above described measuring up to 7 mm. Reactive versus metastatic. Electronically Signed   By: Ashley Royalty M.D.   On: 01/21/2017 15:37   Ct Abdomen Pelvis W Contrast  Result Date: 01/08/2017 CLINICAL DATA:  Diverticular abscess. EXAM: CT ABDOMEN AND PELVIS WITH CONTRAST TECHNIQUE: Multidetector CT imaging of the abdomen and pelvis was performed using the standard protocol following bolus administration of intravenous contrast. CONTRAST:  32m ISOVUE-300 IOPAMIDOL (ISOVUE-300) INJECTION 61% COMPARISON:  CT scan of Jan 05, 2017. FINDINGS: Lower chest: Bibasilar pulmonary nodules are noted concerning for metastatic disease. Mild bibasilar subsegmental atelectasis is noted as well. Hepatobiliary: Probable sludge is noted within the gallbladder. Stable 1.4 cm low density with irregular margins is noted in the left hepatic lobe which is not changed compared to prior exam. This is concerning for possible metastatic focus. Pancreas: Unremarkable. No pancreatic ductal dilatation or surrounding inflammatory changes. Spleen: Normal in size without focal abnormality. Adrenals/Urinary Tract: Adrenal glands are unremarkable. Kidneys are normal, without renal calculi, focal lesion, or hydronephrosis. Bladder is unremarkable. Stomach/Bowel: Visualized esophagus and stomach appear normal. Mildly dilated proximal  small bowel loops are noted concerning for ileus. Colostomy  is noted in left lower quadrant. Status post partial resection of sigmoid colon. Vascular/Lymphatic: No significant vascular findings are present. No enlarged abdominal or pelvic lymph nodes. Reproductive: Multiple small uterine fibroids are noted. Reportedly both ovaries have been removed. Other: There is been interval placement of percutaneous drain into pelvic abscess described on prior exam. The abscess is significantly decreased in size, although it measures 4.3 x 1.8 cm superiorly with 2.9 x 1.0 cm fluid collections seen to the right of the uterus and 2.4 x 1.5 cm fluid collection seen to the left of the uterus. Stable position of surgical drain is seen entering the right anterior abdominal wall, with distal tip in the right lower quadrant. Musculoskeletal: No acute or significant osseous findings. IMPRESSION: Bibasilar pulmonary nodules are again noted concerning for metastatic disease. Probable sludge seen within the gallbladder. Stable 1.4 cm low density noted in left hepatic lobe concerning for possible metastatic disease. 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 Interval placement of percutaneous drain into the pelvic abscess described on prior exam. This abscess is significantly smaller compared to prior exam. Stable position of surgical drain seen involving right side of abdomen. Electronically Signed   By: Marijo Conception, M.D.   On: 01/08/2017 09:08   Ct Abdomen Pelvis W Contrast  Result Date: 01/05/2017 CLINICAL DATA:  History of adenocarcinoma. Recent perforation of the sigmoid colon with subsequent surgery. Leukocytosis. EXAM: CT ABDOMEN AND PELVIS WITH CONTRAST TECHNIQUE: Multidetector CT imaging of the abdomen and pelvis was performed using the standard protocol following bolus administration of intravenous contrast. CONTRAST:  166m ISOVUE-300 IOPAMIDOL (ISOVUE-300) INJECTION 61%, <See Chart> ISOVUE-300 IOPAMIDOL (ISOVUE-300)  INJECTION 61% COMPARISON:  Dec 30, 2016 FINDINGS: Lower chest: Pulmonary nodularity again identified, suspicious for metastatic disease given history. There is a small left pleural effusion. Bibasilar opacities are likely atelectasis. No other acute abnormalities in the lung bases. Hepatobiliary: A low-attenuation lesion in the left hepatic lobe measures 14 mm, unchanged. A cyst is seen along the inferior right hepatic lobe. No other definitive solid masses are seen within the liver. The portal vein is patent. The gallbladder is unremarkable. No other abnormalities in the liver. Pancreas: Unremarkable. No pancreatic ductal dilatation or surrounding inflammatory changes. Spleen: Normal in size without focal abnormality. Adrenals/Urinary Tract: The adrenal glands, kidneys, and ureters are normal. Air in the bladder is likely from recent catheterization. Stomach/Bowel: The distal esophagus and stomach are normal. Small bowel loops are mildly prominent but not grossly dilated. Contrast does not reach the terminal ileum but does traverse the small bowel into the region of the mid ileum. The patient is status post partial colonic resection. There is gas in the colon to the level of the left lower quadrant ostomy. Vascular/Lymphatic: The abdominal aorta is normal in caliber. Shotty nodes in the retroperitoneum are again identified. The previously identified 12 mm node to the left of the aorta on image 35 is unchanged. No new adenopathy identified. Reproductive: Fibroids in the uterus. By report, the ovaries have been removed. Other: There is free fluid within the abdomen and pelvis which could be postoperative. There is an organized collection of fluid as seen on axial image 73. There is peripheral enhancement as well some air within this collection. The main body of this collection measures 10.4 by 5.5 by 4.7 cm. A smaller collection of fluid is seen in the bowel mesenteries on series 2, image 53 measuring 4.7 by 2.1 cm.  This is not nearly as organized and could simply be  postoperative. No other definitive organized collections are seen. There is free air scattered throughout the abdomen and pelvis consistent with the recent postoperative status. Musculoskeletal: No acute changes. IMPRESSION: 1. There is an organizing collection of fluid measuring 10 x 6 x 5 cm in the pelvis. There is peripheral enhancement around this collection as well some air in the collection. Given history, the findings are suspicious for developing abscess. A smaller collection in the bowel mesentary is less specific and could simply be postoperative. 2. Pulmonary nodule suspicious for metastatic disease. 3. Low-attenuation lesion in the left hepatic lobe is nonspecific. However, given history, a metastatic lesion could have this appearance. 4. Probable mild ileus.  No definite obstruction. 5. Shotty nodes in the retroperitoneum could be reactive. However, given history, it would be difficult to completely exclude metastatic disease. These results will be called to the ordering clinician or representative by the Radiologist Assistant, and communication documented in the PACS or zVision Dashboard. Electronically Signed   By: Dorise Bullion III M.D   On: 01/05/2017 12:26   Nm Pulmonary Perf And Vent  Result Date: 01/23/2017 CLINICAL DATA:  Tachycardia and sepsis.  Recent surgeries. EXAM: NUCLEAR MEDICINE VENTILATION - PERFUSION LUNG SCAN TECHNIQUE: Ventilation images were obtained in multiple projections using inhaled aerosol Tc-35mDTPA. Perfusion images were obtained in multiple projections after intravenous injection of Tc-982mAA. RADIOPHARMACEUTICALS:  34.2 MCi Technetium-9952mPA aerosol inhalation and 4.4 mCi Technetium-67m38m IV COMPARISON:  Chest radiograph from today FINDINGS: Ventilation: No focal ventilation defect. Perfusion: There are 2 large segmental perfusion defects involving the right upper and lower lobe. Within the superior segment of  left lower lobe there is a moderate size segmental perfusion defect. IMPRESSION: High probability for acute pulmonary embolus. Electronically Signed   By: TaylKerby Moors.   On: 01/23/2017 12:22   Us VKoreaous Img Lower Bilateral  Result Date: 01/23/2017 CLINICAL DATA:  Acute pulmonary emboli.  Evaluate for source. EXAM: BILATERAL LOWER EXTREMITY VENOUS DOPPLER ULTRASOUND TECHNIQUE: Gray-scale sonography with graded compression, as well as color Doppler and duplex ultrasound were performed to evaluate the lower extremity deep venous systems from the level of the common femoral vein and including the common femoral, femoral, profunda femoral, popliteal and calf veins including the posterior tibial, peroneal and gastrocnemius veins when visible. The superficial great saphenous vein was also interrogated. Spectral Doppler was utilized to evaluate flow at rest and with distal augmentation maneuvers in the common femoral, femoral and popliteal veins. COMPARISON:  None. FINDINGS: RIGHT LOWER EXTREMITY Common Femoral Vein: No evidence of thrombus. Normal compressibility, respiratory phasicity and response to augmentation. Saphenofemoral Junction: No evidence of thrombus. Normal compressibility and flow on color Doppler imaging. Profunda Femoral Vein: No evidence of thrombus. Normal compressibility and flow on color Doppler imaging. Femoral Vein: No evidence of thrombus. Normal compressibility, respiratory phasicity and response to augmentation. Popliteal Vein: No evidence of thrombus. Normal compressibility, respiratory phasicity and response to augmentation. Calf Veins: No evidence of thrombus. Normal compressibility and flow on color Doppler imaging. Superficial Great Saphenous Vein: Not evaluated. Venous Reflux:  Not evaluated. Other Findings:  None. LEFT LOWER EXTREMITY Common Femoral Vein: No evidence of thrombus. Normal compressibility, respiratory phasicity and response to augmentation. Saphenofemoral Junction:  No evidence of thrombus. Normal compressibility and flow on color Doppler imaging. Profunda Femoral Vein: No evidence of thrombus. Normal compressibility and flow on color Doppler imaging. Femoral Vein: No evidence of thrombus. Normal compressibility, respiratory phasicity and response to augmentation. Popliteal Vein: No evidence of  thrombus. Normal compressibility, respiratory phasicity and response to augmentation. Calf Veins: Occlusive thrombus within one of the paired posterior tibial veins. The paired peroneal veins are patent. Superficial Great Saphenous Vein: Not evaluated. Venous Reflux:  Not evaluated. Other Findings: Left popliteal fossa cyst measuring approximately 4.6 x 1.2 x 1.9 cm. IMPRESSION: 1. Occlusive thrombus involving one of the paired left posterior tibial veins. 2. No evidence of DVT elsewhere in the left lower extremity. 3. No evidence of right lower extremity DVT. 4. Baker's cyst involving the left popliteal fossa, measured above. Electronically Signed   By: Evangeline Dakin M.D.   On: 01/23/2017 20:17   Ct Image Guided Drainage By Percutaneous Catheter  Result Date: 01/05/2017 INDICATION: 51 year old female with a peripherally enhancing fluid collection and increasing leukocytosis status post sigmoid colon resection, left lower quadrant colostomy creation and Hartmann's pouch procedure for perforated sigmoid colon. Imaging findings are concerning for postoperative abscess. EXAM: CT GUIDED DRAINAGE OF right lower quadrant intraperitoneal abdominal ABSCESS MEDICATIONS: The patient is currently admitted to the hospital and receiving intravenous antibiotics. The antibiotics were administered within an appropriate time frame prior to the initiation of the procedure. ANESTHESIA/SEDATION: 2 mg IV Versed 100 mcg IV Fentanyl Moderate Sedation Time:  20 minutes The patient was continuously monitored during the procedure by the interventional radiology nurse under my direct supervision.  COMPLICATIONS: None immediate. TECHNIQUE: Informed written consent was obtained from the patient after a thorough discussion of the procedural risks, benefits and alternatives. All questions were addressed. Maximal Sterile Barrier Technique was utilized including caps, mask, sterile gowns, sterile gloves, sterile drape, hand hygiene and skin antiseptic. A timeout was performed prior to the initiation of the procedure. PROCEDURE: The operative field was prepped with Chlorhexidine in a sterile fashion, and a sterile drape was applied covering the operative field. A sterile gown and sterile gloves were used for the procedure. Local anesthesia was provided with 1% Lidocaine. A small dermatotomy was made. Using intermittent CT guidance, an 18 gauge trocar needle was advanced through the right lower quadrant abdominal wall taking care to avoid the inferior epigastric vessels. The needle was advanced into the fluid collection. An Amplatz wire was then advanced the needle and coiled within the fluid collection. The needle was removed. The skin tract was dilated to 80 [place] and a Cook 12 [place] all-purpose drainage catheter was advanced over the wire and formed in the fluid collection. Aspiration yields approximately 40 mL of turbid serosanguineous fluid containing internal debris. A sample was sent to the laboratory for culture. Post drain placement axial CT imaging demonstrates excellent position of the drainage catheter and near complete resolution of the fluid collection. The catheter was secured to the skin with 0 Prolene suture and a sterile bandage. The patient tolerated the procedure well. FINDINGS: Approximately 40 mL turbid serosanguineous fluid with internal debris. IMPRESSION: Technically successful placement of a 48 French percutaneous drain into the right lower quadrant postoperative fluid collection with aspiration as described above. Maintain tube to JP bulb suction. When drain output is minimal, recommend  repeat CT scan of the pelvis with intravenous contrast prior to drain removal. Electronically Signed   By: Jacqulynn Cadet M.D.   On: 01/05/2017 16:00    Assessment & Plan:   #  51 year old female patient with carcinoma of unknown primary; and recent pelvic abscess- is currently admitted to the hospital for fever/sepsis.  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  PDL-1 50%. awaiting second opinion consultation of the pathology at [facility]. Lewis 1 workup.  Given the rapidly progressive malignancy -We'll plan to start immunotherapy [keytruda- orders are in] end of next week [ June 7th or 8th]/ once infection is better controlled. Poor candidate for chemotherapy given the active infection.   # Pelvic abscess status post- IR guided drain. Patient currently on meropenem [since having a reaction to cefepime]. Better tolerated. No rash overnight. Patient likely to be discharged on IV antibiotics thru PICC line. CRP elevated at 8/ also leucocytosis; improving.    # Acute PE- causing tachycardia. Currently on IV heparin/ plan transition to oral anticoagulation- like eliquis or xarelto today.   Patient expressed understanding and was in agreement with this plan. She also understands that She can call clinic at any time with any questions, concerns, or complaints.   Cancer Staging No matching staging information was found for the patient.  Lloyd Huger, MD   01/30/2017 12:18 AM

## 2017-01-30 NOTE — Progress Notes (Signed)
ID addendum I did review CT with Interventional radiology. He stated there would probably not be much benefit to up-sizing a drain and also there is no focal fluid collection to drain but it appears like an impressive phlegmon.  He has placed orders to flush the drain 3x a day to see if can increase drainage. WIll plan on cont meropenem and have added fluconazole.

## 2017-01-30 NOTE — Progress Notes (Signed)
PT Cancellation Note  Patient Details Name: Anna [name] MRN: [id number] DOB: [birth year]   Cancelled Treatment:    Reason Eval/Treat Not Completed: Medical issues which prohibited therapy (Consult received and chart reviewed. Patient noted with hypokalemia (critical value 2.7), currently receiving replacement.  Will re-attempt at later time/date as medically appropriate.)   Selig Wampole H. Owens Shark, PT, DPT, NCS 01/30/17, 11:39 AM [phone]

## 2017-01-30 NOTE — Consult Note (Signed)
SURGICAL CONSULTATION NOTE (initial) - cpt: 99254  HISTORY OF PRESENT ILLNESS (HPI):  51 y.o. Anna [name] with recently diagnosed metastatic adenocarcinoma of unclear primary etiology, discovered during surgery for what was thought to be an ovarian cyst, since s/p partial colectomy with colostomy for sigmoid perforation and subsequent PE on anticoagulation, presented to [facility] ED after patient's home health RN reported low-grade fever and tachycardia to our office, who advised further evaluation to assess for propagation of PE in particular. Patient denies CP, SOB, N/V, or chills and describes persistent tachycardia to HR 110's and low-grade fevers x 1.5 months.  During the past month, patient has been treated for sepsis secondary to pelvic abscesses s/p drainage and ongoing Merem antibiotic therapy via PICC. She also has vertical midline negative pressure dressing placed after surgery and was reportedly scheduled for outpatient surgical follow-up today and oncology appointment to discuss starting immunotherapy.  Patient reports she was just beginning to be able to enjoy being home and starting to recover an appetite for food (+flatus, +BM) before advised to seek further assessment at [facility] ED, and she is eager to return home.  Surgery is consulted by medical physician Dr. Verdell Carmine in this context for evaluation and management of pelvic abscesses in context of persistent leukocytosis and low-grade fevers.  PAST MEDICAL HISTORY (PMH):  Past Medical History:  Diagnosis Date  . Cancer ([place])   . Cancer with unknown primary site [facility])   . Colon polyp   . Diverticulosis   . Fibroid uterus   . Heme positive stool 08/01/2014  . HH (hiatus hernia)   . Large bowel perforation ([place]) 12/31/2016  . Metastatic adenocarcinoma ([place]) 12/20/2016  . Pelvic pain in Anna [name] 01/13/2017  . Perforation of sigmoid colon ([place])      PAST SURGICAL HISTORY ([place]):  Past Surgical History:  Procedure Laterality Date  . COLONOSCOPY     . CYSTOSCOPY  12/20/2016   Procedure: CYSTOSCOPY;  Surgeon: Boykin Nearing, MD;  Location: [facility] ORS;  Service: Gynecology;;  . ESOPHAGOGASTRODUODENOSCOPY    . LAPAROSCOPIC BILATERAL SALPINGECTOMY Bilateral 12/20/2016   Procedure: LAPAROSCOPIC BILATERAL SALPINGOOPHERECTOMY,  Excision of anterior 5 cm calcified uterine fibroid;  Surgeon: Boykin Nearing, MD;  Location: [facility] ORS;  Service: Gynecology;  Laterality: Bilateral;  . LAPAROSCOPIC LYSIS OF ADHESIONS  12/20/2016   Procedure: LAPAROSCOPIC LYSIS OF ADHESIONS WITH DISSECTION OF LEFT TUBE AND OVARY.;  Surgeon: Olean Ree, MD;  Location: [facility] ORS;  Service: General;;  . LAPAROTOMY N/A 12/31/2016   Procedure: EXPLORATORY LAPAROTOMY, sigmoid colectomy, removal of omentum, colostomy;  Surgeon: Jules Husbands, MD;  Location: [facility] ORS;  Service: General;  Laterality: N/A;  . SIGMOIDOSCOPY  12/20/2016   Procedure: RIGID SIGMOIDOSCOPY;  Surgeon: Olean Ree, MD;  Location: [facility] ORS;  Service: General;;  . TONSILLECTOMY       MEDICATIONS:  Prior to Admission medications   Medication Sig Start Date End Date Taking? Authorizing Provider  acetaminophen (TYLENOL) 500 MG tablet Take 500 mg by mouth as needed for mild pain.   Yes [provider]  apixaban (ELIQUIS) 5 MG TABS tablet Take 1 tablet (5 mg total) by mouth 2 (two) times daily. Please take 2 pills orally twice a day for 7 days as a loading dose, then continue 1 pill twice a day indefinitely, thank you Patient taking differently: Take 5-10 mg by mouth 2 (two) times daily. take 2 pills orally twice a day for 7 days as a loading dose, then continue 1 pill twice a day indefinitely  01/31/17  Yes Theodoro Grist, MD  diphenhydrAMINE (BENADRYL) 25 mg capsule Take 1 capsule (25 mg total) by mouth every 6 (six) hours as needed (Rash). 01/23/17  Yes Theodoro Grist, MD  meropenem 1 g in sodium chloride 0.9 % 100 mL Inject 1 g into the vein every 8 (eight) hours. 01/23/17  Yes Theodoro Grist,  MD  metoprolol succinate (TOPROL-XL) 25 MG 24 hr tablet Take 1 tablet by mouth daily.  01/21/17  Yes [provider]  oxyCODONE-acetaminophen (ROXICET) 5-325 MG tablet Take 1 tablet by mouth every 6 (six) hours as needed. Patient taking differently: Take 1 tablet by mouth every 6 (six) hours as needed for moderate pain.  01/10/17 01/10/18 Yes Florene Glen, MD     ALLERGIES:  Allergies  Allergen Reactions  . Cefepime Rash  . Penicillins Rash    As a child. Has patient had a PCN reaction causing immediate rash, facial/tongue/throat swelling, SOB or lightheadedness with hypotension: Unknown Has patient had a PCN reaction causing severe rash involving mucus membranes or skin necrosis: Unknown Has patient had a PCN reaction that required hospitalization: No Has patient had a PCN reaction occurring within the last 10 years: No If all of the above answers are "NO", then may proceed with Cephalosporin use.      SOCIAL HISTORY:  Social History   Social History  . Marital status: Married    Spouse name: N/A  . Number of children: N/A  . Years of education: N/A   Occupational History  . Not on file.   Social History Main Topics  . Smoking status: Never Smoker  . Smokeless tobacco: Never Used  . Alcohol use No  . Drug use: No  . Sexual activity: Yes   Other Topics Concern  . Not on file   Social History Narrative  . No narrative on file    The patient currently resides (home / rehab facility / nursing home): Home  The patient normally is (ambulatory / bedbound): Ambulatory   FAMILY HISTORY:  Family History  Problem Relation Age of Onset  . Colon cancer Mother 79  . Diabetes Mother   . Asthma Father   . Breast cancer Neg Hx    REVIEW OF SYSTEMS:  Constitutional: denies weight loss, fever, chills, or sweats  Eyes: denies any other vision changes, history of eye injury  ENT: denies sore throat, hearing problems  Respiratory: denies shortness of breath, wheezing   Cardiovascular: denies chest pain, palpitations  Gastrointestinal: denies abdominal pain or N/V, bowel function as per HPI Genitourinary: denies burning with urination or urinary frequency Musculoskeletal: denies any other joint pains or cramps  Skin: denies any other rashes or skin discolorations  Neurological: denies any other headache, dizziness, weakness  Psychiatric: denies any other depression, anxiety   All other review of systems were negative   VITAL SIGNS:  Temp:  [97.7 F (36.5 C)-100.3 F (37.9 C)] 98.9 F (37.2 C) (06/07 1207) Pulse Rate:  [122-135] 124 (06/07 1207) Resp:  [18-42] 20 (06/07 1201) BP: (104-143)/(60-94) 106/67 (06/07 1201) SpO2:  [97 %-100 %] 97 % (06/07 1201)     Height: 5\' 3"  (160 cm) Weight: 121 lb (54.9 kg) BMI (Calculated): 21.5   INTAKE/OUTPUT:  This shift: Total I/O In: 240 [P.O.:240] Out: 1400 [Urine:1400]  Last 2 shifts: @IOLAST2SHIFTS @   PHYSICAL EXAM:  Constitutional:  -- Normal body habitus  -- Awake, alert, and oriented x3  Eyes:  -- Pupils equally round and reactive to light  --  No scleral icterus  Ear, nose, and throat:  -- No jugular venous distension  Pulmonary:  -- No crackles  -- Equal breath sounds bilaterally -- Breathing non-labored at rest Cardiovascular:  -- S1, S2 present  -- No pericardial rubs Gastrointestinal:  -- Abdomen soft, nontender, nondistended, no guarding/rebound -- Midline VAC dressing well-secured, JP x2 well-secured with small volumes of creamy white fluid in each bulb and no surrounding erythema  -- No abdominal masses appreciated, pulsatile or otherwise Musculoskeletal and Integumentary:  -- Wounds or skin discoloration: None appreciated except post-surgical and post-drainage wounds -- Extremities: B/L UE and LE FROM, hands and feet warm, no edema  Neurologic:  -- Motor function: intact and symmetric -- Sensation: intact and symmetric  Labs:  CBC Latest Ref Rng & Units 01/30/2017 01/29/2017  01/24/2017  WBC 3.6 - 11.0 K/uL 18.0(H) 19.4(H) 15.9(H)  Hemoglobin 12.0 - 16.0 g/dL 7.5(L) 8.7(L) 8.5(L)  Hematocrit 35.0 - Anna.0 % 22.9(L) 26.7(L) 26.0(L)  Platelets 150 - 440 K/uL 630(H) 627(H) 653(H)   CMP Latest Ref Rng & Units 01/30/2017 01/29/2017 01/23/2017  Glucose 65 - 99 mg/dL 99 96 104(H)  BUN 6 - 20 mg/dL 5(L) 11 6  Creatinine 0.44 - 1.00 mg/dL 0.34(L) 0.38(L) 0.48  Sodium 135 - 145 mmol/L 134(L) 132(L) 135  Potassium 3.5 - 5.1 mmol/L 2.7(LL) 3.1(L) 3.6  Chloride 101 - 111 mmol/L 97(L) 93(L) 101  CO2 22 - 32 mmol/L 29 30 28   Calcium 8.9 - 10.3 mg/dL 7.7(L) 8.0(L) 7.4(L)  Total Protein 6.5 - 8.1 g/dL - 6.1(L) -  Total Bilirubin 0.3 - 1.2 mg/dL - 0.7 -  Alkaline Phos 38 - 126 U/L - 90 -  AST 15 - 41 U/L - 78(H) -  ALT 14 - 54 U/L - 62(H) -   Imaging studies:  CTA Chest and CT Abdomen and Pelvis with Contrast (01/29/2017) - personally reviewed No definite evidence of pulmonary embolus.  Continued presence of multiple pulmonary and hepatic metastatic lesions.  Stable complex low density seen posteriorly in left renal cortex concerning for infection, infarction or possibly metastatic lesion.  Left periaortic adenopathy is noted with largest lymph node measuring 15 mm. This is consistent with metastatic disease.  Continued presence of surgical drain seen in subhepatic space and pigtail drainage catheter in the right lower quadrant, with no significant fluid collection is noted around either catheter.  Continued presence of inflammatory changes in the pelvis adjacent to the fibroid uterus, with multiple small complex fluid collections which are not significantly changed compared to prior exam.  Assessment/Plan: 51 y.o. 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   - IR repositioning of current drains  - continue antibiotics per infectious disease  - immunotherapy, chemotherapy as per oncology  - anticoagulation ongoing for PE's (not visualized on CT yesterday)  - medical management and discharge planning per medical team  - will continue to follow, please call with any questions  - NPO after midnight for anticipated IR drainage  All of the above findings and recommendations were discussed with the patient and her husband, and all of patient's and her husband's questions were answered to their expressed satisfaction.  Thank you for the opportunity to participate in this patient's care.   -- Marilynne Drivers Rosana Hoes, MD, [place]: [facility]  General Surgery - Partnering for exceptional care. Office: [phone]

## 2017-01-31 ENCOUNTER — Encounter: Payer: Self-pay | Admitting: Internal Medicine

## 2017-01-31 LAB — BLOOD CULTURE ID PANEL (REFLEXED)
Acinetobacter baumannii: NOT DETECTED
Candida albicans: NOT DETECTED
Candida glabrata: NOT DETECTED
Candida krusei: NOT DETECTED
Candida parapsilosis: NOT DETECTED
Candida tropicalis: NOT DETECTED
ENTEROBACTER CLOACAE COMPLEX: NOT DETECTED
ENTEROCOCCUS SPECIES: NOT DETECTED
Enterobacteriaceae species: NOT DETECTED
Escherichia coli: NOT DETECTED
Haemophilus influenzae: NOT DETECTED
Klebsiella oxytoca: NOT DETECTED
Klebsiella pneumoniae: NOT DETECTED
LISTERIA MONOCYTOGENES: NOT DETECTED
METHICILLIN RESISTANCE: DETECTED — AB
NEISSERIA MENINGITIDIS: NOT DETECTED
PROTEUS SPECIES: NOT DETECTED
Pseudomonas aeruginosa: NOT DETECTED
SERRATIA MARCESCENS: NOT DETECTED
STAPHYLOCOCCUS AUREUS BCID: NOT DETECTED
STAPHYLOCOCCUS SPECIES: DETECTED — AB
STREPTOCOCCUS AGALACTIAE: NOT DETECTED
STREPTOCOCCUS SPECIES: NOT DETECTED
Streptococcus pneumoniae: NOT DETECTED
Streptococcus pyogenes: NOT DETECTED

## 2017-01-31 LAB — CBC
HCT: 23.1 % — ABNORMAL LOW (ref 35.0–47.0)
Hemoglobin: 7.4 g/dL — ABNORMAL LOW (ref 12.0–16.0)
MCH: 28.5 pg (ref 26.0–34.0)
MCHC: 32.1 g/dL (ref 32.0–36.0)
MCV: 88.7 fL (ref 80.0–100.0)
PLATELETS: 630 10*3/uL — AB (ref 150–440)
RBC: 2.61 MIL/uL — ABNORMAL LOW (ref 3.80–5.20)
RDW: 17.4 % — ABNORMAL HIGH (ref 11.5–14.5)
WBC: 17.8 10*3/uL — AB (ref 3.6–11.0)

## 2017-01-31 LAB — BASIC METABOLIC PANEL
Anion gap: 7 (ref 5–15)
BUN: 6 mg/dL (ref 6–20)
CO2: 29 mmol/L (ref 22–32)
CREATININE: 0.43 mg/dL — AB (ref 0.44–1.00)
Calcium: 7.9 mg/dL — ABNORMAL LOW (ref 8.9–10.3)
Chloride: 99 mmol/L — ABNORMAL LOW (ref 101–111)
GFR calc Af Amer: >60 mL/min (ref >60)
Glucose, Bld: 80 mg/dL (ref 65–99)
POTASSIUM: 4 mmol/L (ref 3.5–5.1)
SODIUM: 135 mmol/L (ref 135–145)

## 2017-01-31 MED ORDER — METOPROLOL SUCCINATE ER 50 MG PO TB24: 50.0000 mg | ORAL_TABLET | Freq: Every day | ORAL | Status: DC

## 2017-01-31 MED ORDER — METOPROLOL TARTRATE 5 MG/5ML IV SOLN
2.5000 mg | Freq: Once | INTRAVENOUS | Status: DC
  Filled 2017-01-31 (×2): qty 5

## 2017-01-31 MED ORDER — METOPROLOL SUCCINATE ER 25 MG PO TB24
25.0000 mg | ORAL_TABLET | Freq: Every day | ORAL | Status: DC
  Administered 2017-01-31 – 2017-02-01 (×2): 25 mg via ORAL
  Filled 2017-01-31 (×2): qty 1

## 2017-01-31 NOTE — Progress Notes (Signed)
[place] INFECTIOUS DISEASE PROGRESS NOTE Date of Admission:  01/29/2017     ID: Anna [name] is a 51 y.o. female with intraabdominal abscess Active Problems:   Sepsis ([place])   Subjective: No fevers, rash improving. eating  ROS  [employer] systems are reviewed and negative except per hpi  Medications:  Antibiotics Given (last 72 hours)    Date/Time Action Medication Dose Rate   01/29/17 1832 New Bag/Given   vancomycin (VANCOCIN) IVPB 1000 mg/200 mL premix 1,000 mg 200 mL/hr   01/30/17 0228 New Bag/Given   vancomycin (VANCOCIN) IVPB 1000 mg/200 mL premix 1,000 mg 200 mL/hr   01/30/17 0228 New Bag/Given   meropenem (MERREM) 1 g in sodium chloride 0.9 % 100 mL IVPB 1 g 200 mL/hr   01/30/17 0917 New Bag/Given   meropenem (MERREM) 1 g in sodium chloride 0.9 % 100 mL IVPB 1 g 200 mL/hr   01/30/17 2001 New Bag/Given   meropenem (MERREM) 1 g in sodium chloride 0.9 % 100 mL IVPB 1 g 200 mL/hr   01/31/17 0317 New Bag/Given   meropenem (MERREM) 1 g in sodium chloride 0.9 % 100 mL IVPB 1 g 200 mL/hr   01/31/17 1334 New Bag/Given   meropenem (MERREM) 1 g in sodium chloride 0.9 % 100 mL IVPB 1 g 200 mL/hr     . apixaban  5 mg Oral BID  . metoprolol succinate  25 mg Oral Daily  . potassium chloride  20 mEq Oral BID    Objective: Vital signs in last 24 hours: Temp:  [98.4 F (36.9 C)-100.2 F (37.9 C)] 98.8 F (37.1 C) (06/08 1158) Pulse Rate:  [130-138] 134 (06/08 1158) Resp:  [18-26] 20 (06/08 1158) BP: (107-129)/(54-76) 113/76 (06/08 1158) SpO2:  [96 %-99 %] 99 % (06/08 1158) Constitutional:  oriented to person, place, and time. appears well-developed and well-nourished. No distress.  HENT: [place]/AT, PERRLA, no scleral icterus Mouth/Throat: Oropharynx is clear and moist. No oropharyngeal exudate.  Cardiovascular: tachy, regular Pulmonary/Chest: Effort normal and breath sounds normal. No respiratory distress.  has no wheezes.  Neck = supple, no nuchal rigidity Abdominal:  Soft. Wound vac over ant abd incision site. 2 JP drains in RLQ and pelvic region with thin serous drainage Lymphadenopathy: no cervical adenopathy. No axillary adenopathy Neurological: alert and oriented to person, place, and time.  Skin: Skin is warm and dry. No rash noted. No erythema.  Psychiatric: a normal mood and affect.  behavior is normal.   Lab Results  Recent Labs  01/30/17 0100 01/31/17 0536  WBC 18.0* 17.8*  HGB 7.5* 7.4*  HCT 22.9* 23.1*  NA 134* 135  K 2.7* 4.0  CL 97* 99*  CO2 29 29  BUN 5* 6  CREATININE 0.34* 0.43*    Microbiology: Results for orders placed or performed during the hospital encounter of 01/29/17  Culture, blood (Routine x 2)     Status: None (Preliminary result)   Collection Time: 01/29/17  5:14 PM  Result Value Ref Range Status   Specimen Description BLOOD RAC  Final   Special Requests   Final    BOTTLES DRAWN AEROBIC AND ANAEROBIC Blood Culture adequate volume   Culture NO GROWTH 2 DAYS  Final   Report Status PENDING  Incomplete  Culture, blood (Routine x 2)     Status: None (Preliminary result)   Collection Time: 01/29/17  5:14 PM  Result Value Ref Range Status   Specimen Description BLOOD LARM  Final   Special Requests  Final    BOTTLES DRAWN AEROBIC AND ANAEROBIC Blood Culture adequate volume   Culture  Setup Time   Final    GRAM POSITIVE COCCI AEROBIC BOTTLE ONLY CRITICAL RESULT CALLED TO, READ BACK BY AND VERIFIED WITH: KAREN HAYES 01/31/17 0909 SGD    Culture GRAM POSITIVE COCCI  Final   Report Status PENDING  Incomplete  Blood Culture ID Panel (Reflexed)     Status: Abnormal   Collection Time: 01/29/17  5:14 PM  Result Value Ref Range Status   Enterococcus species NOT DETECTED NOT DETECTED Final   Listeria monocytogenes NOT DETECTED NOT DETECTED Final   Staphylococcus species DETECTED (A) NOT DETECTED Final    Comment: Methicillin (oxacillin) resistant coagulase negative staphylococcus. Possible blood culture contaminant  (unless isolated from more than one blood culture draw or clinical case suggests pathogenicity). No antibiotic treatment is indicated for blood  culture contaminants. CRITICAL RESULT CALLED TO, READ BACK BY AND VERIFIED WITH: KAREN HAYES 01/31/17 0909 SGD    Staphylococcus aureus NOT DETECTED NOT DETECTED Final   Methicillin resistance DETECTED (A) NOT DETECTED Final    Comment: CRITICAL RESULT CALLED TO, READ BACK BY AND VERIFIED WITH: KAREN HAYES 01/31/17 0909 SGD    Streptococcus species NOT DETECTED NOT DETECTED Final   Streptococcus agalactiae NOT DETECTED NOT DETECTED Final   Streptococcus pneumoniae NOT DETECTED NOT DETECTED Final   Streptococcus pyogenes NOT DETECTED NOT DETECTED Final   Acinetobacter baumannii NOT DETECTED NOT DETECTED Final   Enterobacteriaceae species NOT DETECTED NOT DETECTED Final   Enterobacter cloacae complex NOT DETECTED NOT DETECTED Final   Escherichia coli NOT DETECTED NOT DETECTED Final   Klebsiella oxytoca NOT DETECTED NOT DETECTED Final   Klebsiella pneumoniae NOT DETECTED NOT DETECTED Final   Proteus species NOT DETECTED NOT DETECTED Final   Serratia marcescens NOT DETECTED NOT DETECTED Final   Haemophilus influenzae NOT DETECTED NOT DETECTED Final   Neisseria meningitidis NOT DETECTED NOT DETECTED Final   Pseudomonas aeruginosa NOT DETECTED NOT DETECTED Final   Candida albicans NOT DETECTED NOT DETECTED Final   Candida glabrata NOT DETECTED NOT DETECTED Final   Candida krusei NOT DETECTED NOT DETECTED Final   Candida parapsilosis NOT DETECTED NOT DETECTED Final   Candida tropicalis NOT DETECTED NOT DETECTED Final  MRSA PCR Screening     Status: None   Collection Time: 01/30/17  9:27 AM  Result Value Ref Range Status   MRSA by PCR NEGATIVE NEGATIVE Final    Comment:        The GeneXpert MRSA Assay (FDA approved for NASAL specimens only), is one component of a comprehensive MRSA colonization surveillance program. It is not intended to diagnose  MRSA infection nor to guide or monitor treatment for MRSA infections.     Studies/Results: Dg Chest 2 View  Result Date: 01/29/2017 CLINICAL DATA:  51 year old with metastatic colon cancer, presenting with fever. Current history of pulmonary emboli. EXAM: CHEST  2 VIEW COMPARISON:  CTA chest 01/23/2017.  Chest x-ray 01/23/2017. FINDINGS: AP semi-erect and lateral images were obtained. Cardiomediastinal silhouette unremarkable, unchanged. Multiple bilateral lung nodules as identified on the 01/23/2017 examinations. Mild atelectasis in the lower lobes as noted on the prior CT. No new pulmonary parenchymal abnormalities. No visible pleural effusions. Left arm PICC tip in the lower SVC. IMPRESSION: 1. Stable atelectasis involving the lower lobes since the CT 01/23/2017. 2. Multiple bilateral pulmonary metastases as noted on the CT. 3. No new/acute cardiopulmonary abnormalities. Electronically Signed   By: Sherran Needs.D.  On: 01/29/2017 18:17   Ct Angio Chest Pe W Or Wo Contrast  Result Date: 01/29/2017 CLINICAL DATA:  Fever, tachycardia. EXAM: CT ANGIOGRAPHY CHEST CT ABDOMEN AND PELVIS WITH CONTRAST TECHNIQUE: Multidetector CT imaging of the chest was performed using the standard protocol during bolus administration of intravenous contrast. Multiplanar CT image reconstructions and MIPs were obtained to evaluate the vascular anatomy. Multidetector CT imaging of the abdomen and pelvis was performed using the standard protocol during bolus administration of intravenous contrast. CONTRAST:  100 mL of Isovue 370 intravenously. COMPARISON:  CT scan of Jan 21, 2017. FINDINGS: CTA CHEST FINDINGS Cardiovascular: Satisfactory opacification of the pulmonary arteries to the segmental level. No evidence of pulmonary embolism. Normal heart size. No pericardial effusion. Mediastinum/Nodes: No enlarged mediastinal, hilar, or axillary lymph nodes. Thyroid gland, trachea, and esophagus demonstrate no significant  findings. Lungs/Pleura: Multiple nodular densities are noted throughout both lungs consistent with metastatic disease. Minimal bibasilar subsegmental atelectasis is noted. No pneumothorax or pleural effusion is noted. Musculoskeletal: No chest wall abnormality. No acute or significant osseous findings. Review of the MIP images confirms the above findings. CT ABDOMEN and PELVIS FINDINGS Hepatobiliary: The gallstones are noted. Stable hepatic lesions are noted consistent with metastatic disease. Pancreas: Unremarkable. No pancreatic ductal dilatation or surrounding inflammatory changes. Spleen: Normal in size without focal abnormality. Adrenals/Urinary Tract: Adrenal glands appear normal. No hydronephrosis or renal obstruction is noted. No renal or ureteral calculi are noted. Urinary bladder is unremarkable. Stable complex low density is noted posteriorly in the left renal cortex concerning for infection, infarction or possibly metastatic disease. Stomach/Bowel: There is no evidence of bowel obstruction. 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 Vascular/Lymphatic: No significant vascular abnormality is noted. Left periaortic adenopathy is noted consistent with metastatic disease. The largest measures 15 mm. Reproductive: Reportedly, both ovaries have been removed. Probable fibroid uterus is noted. Continued presence of inflammatory changes seen around the uterus in the pelvis with multiple small fluid collections, the largest measuring 33 x 21 mm. These do not appear to be significantly changed compared to prior exam. Other: Surgical drain remains in the subhepatic space. No fluid collection is noted around this drain. Continued and stable presence of pigtail drainage catheter seen in the right lower quadrant with no significant fluid collection noted around it. Midline surgical incision is noted. Musculoskeletal: No acute or significant osseous findings. Review of the MIP images confirms the above findings.  IMPRESSION: No definite evidence of pulmonary embolus. Continued presence of multiple pulmonary and hepatic metastatic lesions. Stable complex low density seen posteriorly in left renal cortex concerning for infection, infarction or possibly metastatic lesion. Left periaortic adenopathy is noted with largest lymph node measuring 15 mm. This is consistent with metastatic disease. Continued presence of surgical drain seen in subhepatic space and pigtail drainage catheter in the right lower quadrant, with no significant fluid collection is noted around either catheter. Continued presence of inflammatory changes in the pelvis adjacent to the fibroid uterus, with multiple small complex fluid collections which are not significantly changed compared to prior exam. Electronically Signed   By: Marijo Conception, M.D.   On: 01/29/2017 20:38   Ct Abdomen Pelvis W Contrast  Result Date: 01/29/2017 CLINICAL DATA:  Fever, tachycardia. EXAM: CT ANGIOGRAPHY CHEST CT ABDOMEN AND PELVIS WITH CONTRAST TECHNIQUE: Multidetector CT imaging of the chest was performed using the standard protocol during bolus administration of intravenous contrast. Multiplanar CT image reconstructions and MIPs were obtained to evaluate the vascular anatomy. Multidetector CT imaging  of the abdomen and pelvis was performed using the standard protocol during bolus administration of intravenous contrast. CONTRAST:  100 mL of Isovue 370 intravenously. COMPARISON:  CT scan of Jan 21, 2017. FINDINGS: CTA CHEST FINDINGS Cardiovascular: Satisfactory opacification of the pulmonary arteries to the segmental level. No evidence of pulmonary embolism. Normal heart size. No pericardial effusion. Mediastinum/Nodes: No enlarged mediastinal, hilar, or axillary lymph nodes. Thyroid gland, trachea, and esophagus demonstrate no significant findings. Lungs/Pleura: Multiple nodular densities are noted throughout both lungs consistent with metastatic disease. Minimal bibasilar  subsegmental atelectasis is noted. No pneumothorax or pleural effusion is noted. Musculoskeletal: No chest wall abnormality. No acute or significant osseous findings. Review of the MIP images confirms the above findings. CT ABDOMEN and PELVIS FINDINGS Hepatobiliary: The gallstones are noted. Stable hepatic lesions are noted consistent with metastatic disease. Pancreas: Unremarkable. No pancreatic ductal dilatation or surrounding inflammatory changes. Spleen: Normal in size without focal abnormality. Adrenals/Urinary Tract: Adrenal glands appear normal. No hydronephrosis or renal obstruction is noted. No renal or ureteral calculi are noted. Urinary bladder is unremarkable. Stable complex low density is noted posteriorly in the left renal cortex concerning for infection, infarction or possibly metastatic disease. Stomach/Bowel: There is no evidence of bowel obstruction. 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 Vascular/Lymphatic: No significant vascular abnormality is noted. Left periaortic adenopathy is noted consistent with metastatic disease. The largest measures 15 mm. Reproductive: Reportedly, both ovaries have been removed. Probable fibroid uterus is noted. Continued presence of inflammatory changes seen around the uterus in the pelvis with multiple small fluid collections, the largest measuring 33 x 21 mm. These do not appear to be significantly changed compared to prior exam. Other: Surgical drain remains in the subhepatic space. No fluid collection is noted around this drain. Continued and stable presence of pigtail drainage catheter seen in the right lower quadrant with no significant fluid collection noted around it. Midline surgical incision is noted. Musculoskeletal: No acute or significant osseous findings. Review of the MIP images confirms the above findings. IMPRESSION: No definite evidence of pulmonary embolus. Continued presence of multiple pulmonary and hepatic metastatic lesions. Stable  complex low density seen posteriorly in left renal cortex concerning for infection, infarction or possibly metastatic lesion. Left periaortic adenopathy is noted with largest lymph node measuring 15 mm. This is consistent with metastatic disease. Continued presence of surgical drain seen in subhepatic space and pigtail drainage catheter in the right lower quadrant, with no significant fluid collection is noted around either catheter. Continued presence of inflammatory changes in the pelvis adjacent to the fibroid uterus, with multiple small complex fluid collections which are not significantly changed compared to prior exam. Electronically Signed   By: Marijo Conception, M.D.   On: 01/29/2017 20:38    Assessment/Plan: Emerie Vanderkolk is a 51 y.o. female with recent dx of metastatic adenocarcinoma of unknown primary , s.p colonic perforation and Hartmanns pouch procedure May 8 complicated by intra-abdominal abscess. May 8 cx grew E coli, Eikenella, viridans strep.  May 13 had placement of drain with findings of 40 ML fluid and debris - cx negative.  Dced initially on cipro and flagyl but had worsening wbc up to 24 K so readmitted 5/29-6/1. CT done showed worsening metastatic disease, phlegmon in pelvis with presummed abscess collections with pigtail cath in place. Started on cefepime but developed a rash but was able to tolerate meropenem. We placed picc and she was dced on IV meropenem June 6 - readmitted with fevers and  peristently elevated WBC. Otpt home health labs showed wbc 20 and plts elevated in 700s.   CT has been done this admission and still shows pelvic inflamm changes and multiple small complex fluid collections unchanged. Reviewed with IR and no real intervention possible.  She currently denies abd pain, fevers improving. PICC working well Received vanco and had rash and redmans. She had no rash at home with them meropenem.  I still  think her persistent leukocytosis, thrombocytosis,  tachycardia and fevers are from the ongoing abscess collection in pelvic area but no further drainage possible.  1/2 bcx + Coag neg staph - likely contaminant.  Recommendations Continue meropenem until 6/28 Continue  antifungal coverage with fluconazole until 6/28 If second set of bcx done on admission remain neg and fu bcx negative then can dc 6/9 on IV meropenem and oral fluconazole.  If any of the other bcx are + coag neg staph would suggest remove PICC line, start daptomycin  and then replace new PICC the next day. Thank you very much for the consult. Will follow with you.  FITZGERALD, DAVID P   01/31/2017, 3:24 PM

## 2017-01-31 NOTE — Progress Notes (Signed)
PHARMACY - PHYSICIAN COMMUNICATION CRITICAL VALUE ALERT - BLOOD CULTURE IDENTIFICATION (BCID)  Results for orders placed or performed during the hospital encounter of 01/29/17  Blood Culture ID Panel (Reflexed) (Collected: 01/29/2017  5:14 PM)  Result Value Ref Range   Enterococcus species NOT DETECTED NOT DETECTED   Listeria monocytogenes NOT DETECTED NOT DETECTED   Staphylococcus species DETECTED (A) NOT DETECTED   Staphylococcus aureus NOT DETECTED NOT DETECTED   Methicillin resistance DETECTED (A) NOT DETECTED   Streptococcus species NOT DETECTED NOT DETECTED   Streptococcus agalactiae NOT DETECTED NOT DETECTED   Streptococcus pneumoniae NOT DETECTED NOT DETECTED   Streptococcus pyogenes NOT DETECTED NOT DETECTED   Acinetobacter baumannii NOT DETECTED NOT DETECTED   Enterobacteriaceae species NOT DETECTED NOT DETECTED   Enterobacter cloacae complex NOT DETECTED NOT DETECTED   Escherichia coli NOT DETECTED NOT DETECTED   Klebsiella oxytoca NOT DETECTED NOT DETECTED   Klebsiella pneumoniae NOT DETECTED NOT DETECTED   Proteus species NOT DETECTED NOT DETECTED   Serratia marcescens NOT DETECTED NOT DETECTED   Haemophilus influenzae NOT DETECTED NOT DETECTED   Neisseria meningitidis NOT DETECTED NOT DETECTED   Pseudomonas aeruginosa NOT DETECTED NOT DETECTED   Candida albicans NOT DETECTED NOT DETECTED   Candida glabrata NOT DETECTED NOT DETECTED   Candida krusei NOT DETECTED NOT DETECTED   Candida parapsilosis NOT DETECTED NOT DETECTED   Candida tropicalis NOT DETECTED NOT DETECTED    Name of physician (or Provider) Contacted: Sainani   Changes to prescribed antibiotics required: continue current regimen.  Simpson,Michael L 01/31/2017  5:22 PM

## 2017-01-31 NOTE — Consult Note (Signed)
[employer] Nurse wound consult note  Reason for Consult: Midline surgical wound. NPWT Medela therapy in place.  Marland Kitchen  Potential discharge home today.  Will change NPWT dressing and re apply home unit LLQ Colostomy Wound type:Surgical Pressure Injury POA: N/A Measurement:12 cm x 3.6 cm x 0.3 cm  Wound OAC:ZYSAY red granulation tissue noted.  Drainage (amount, consistency, odor) Minimal serosanguinous drainage in canister Periwound:Intact. LLQ colostomy Dressing procedure/placement/frequency:Cleanse abdominal wound with NS and pat dry. Fill wound with black foam. Cover with drape. Trim hair as needed. Change Mon/Wed/Fri. Setting 80 mmHG.  [facility] Nurse ostomy consultnote Stoma type/location: LLQ colostomy Stomal assessment/size: 1 1/8 " round pink and moist Producing brown soft stool Peristomal assessment: itnact Treatment options for stomal/peristomal skin:none. Barrier perimeter trimmed to accomodate NPWT dressing.  Output softbrown stool Ostomy pouching:2pc. 2 1/4" pouch  Education provided: Patient has been emptying pouch at home. Independent with care.  Enrolled patient in [place] program: Yes previous admission.  [place] team will follow.  Domenic Moras RN BSN [place] Pager [phone]

## 2017-01-31 NOTE — Progress Notes (Signed)
Infectious Disease Long Term IV Antibiotic Orders  Diagnosis:intrabdominal abscess  Culture results Eikenella, E coli, viridans strep  Allergies:  Allergies  Allergen Reactions  . Cefepime Rash  . Penicillins Rash    As a child. Has patient had a PCN reaction causing immediate rash, facial/tongue/throat swelling, SOB or lightheadedness with hypotension: Unknown Has patient had a PCN reaction causing severe rash involving mucus membranes or skin necrosis: Unknown Has patient had a PCN reaction that required hospitalization: No Has patient had a PCN reaction occurring within the last 10 years: No If all of the above answers are "NO", then may proceed with Cephalosporin use.     Discharge antibiotics Meropenem  1 gm  every  8 hours  PICC Care per protocol Labs weekly while on IV antibiotics      CBC w diff   Comprehensive met panel  Planned duration of antibiotics 2-3 weeks   Stop date June 28 Follow up clinic date 1-2 weeks   FAX weekly labs to [phone]  Leonel Ramsay, MD

## 2017-01-31 NOTE — Progress Notes (Signed)
SURGICAL PROGRESS NOTE (cpt 331 [id number])  Hospital Day(s): 2.   Post op day(s):  Marland Kitchen   Interval History: Patient seen and examined, no acute events or new complaints overnight. Patient reports feeling well, denies abdominal pain, N/V, fever/chills, CP, or SOB.  Review of Systems:  Constitutional: denies fever, chills  HEENT: denies cough or congestion  Respiratory: denies any shortness of breath  Cardiovascular: denies chest pain or palpitations  Gastrointestinal: abdominal pain, N/V, and bowel function as per interval history Genitourinary: denies burning with urination or urinary frequency Musculoskeletal: denies pain, decreased motor or sensation Integumentary: denies any other rashes or skin discolorations Neurological: denies HA or vision/hearing changes   Vital signs in last 24 hours: [min-max] current  Temp:  [97.7 F (36.5 C)-100.3 F (37.9 C)] 99.9 F (37.7 C) (06/08 0438) Pulse Rate:  [124-135] 135 (06/08 0438) Resp:  [18-26] 26 (06/08 0438) BP: (104-129)/(54-67) 107/54 (06/08 0438) SpO2:  [96 %-100 %] 96 % (06/08 0438)     Height: 5\' 3"  (160 cm) Weight: 121 lb (54.9 kg) BMI (Calculated): 21.5   Intake/Output this shift:  No intake/output data recorded.   Intake/Output last 2 shifts:  @IOLAST2SHIFTS @   Physical Exam:  Constitutional: alert, cooperative and no distress  HENT: normocephalic without obvious abnormality  Eyes: PERRL, EOM's grossly intact and symmetric  Neuro: CN II - XII grossly intact and symmetric without deficit  Respiratory: breathing non-labored at rest  Cardiovascular: regular rate and sinus rhythm  Gastrointestinal: soft, non-tender, and non-distended, midline VAC dressing well-secured, JP x2 well-secured with small volumes of creamy white fluid in each bulb and no surrounding erythema Musculoskeletal: UE and LE FROM, no edema or wounds, motor and sensation grossly intact, NT   Labs:  CBC Latest Ref Rng & Units 01/31/2017 01/30/2017 01/29/2017  WBC  3.6 - 11.0 K/uL 17.8(H) 18.0(H) 19.4(H)  Hemoglobin 12.0 - 16.0 g/dL 7.4(L) 7.5(L) 8.7(L)  Hematocrit 35.0 - 47.0 % 23.1(L) 22.9(L) 26.7(L)  Platelets 150 - 440 K/uL 630(H) 630(H) 627(H)   CMP Latest Ref Rng & Units 01/30/2017 01/29/2017 01/23/2017  Glucose 65 - 99 mg/dL 99 96 104(H)  BUN 6 - 20 mg/dL 5(L) 11 6  Creatinine 0.44 - 1.00 mg/dL 0.34(L) 0.38(L) 0.48  Sodium 135 - 145 mmol/L 134(L) 132(L) 135  Potassium 3.5 - 5.1 mmol/L 2.7(LL) 3.1(L) 3.6  Chloride 101 - 111 mmol/L 97(L) 93(L) 101  CO2 22 - 32 mmol/L 29 30 28   Calcium 8.9 - 10.3 mg/dL 7.7(L) 8.0(L) 7.4(L)  Total Protein 6.5 - 8.1 g/dL - 6.1(L) -  Total Bilirubin 0.3 - 1.2 mg/dL - 0.7 -  Alkaline Phos 38 - 126 U/L - 90 -  AST 15 - 41 U/L - 78(H) -  ALT 14 - 54 U/L - 62(H) -   Imaging studies: No new pertinent imaging studies  Assessment/Plan: 51 y.o. 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              - continue antibiotics per ID             - immunotherapy, chemotherapy as per oncology  - discussed with IR pelvic phlegmon, no fluid collections to be drained             - anticoagulation ongoing for PE's (  no longer visualized on CTA chest yesterday)  - wound care team to change VAC today; please call if any issues with changing home wound VAC             - medical management and discharge planning per medical team  - no indication for any surgical intervention at this time             - will continue to follow, please call with any questions             - okay to resume diet if no IR drainage planned  All of the above findings and recommendations were discussed with the patient, and all of patient's  questions were answered to her expressed satisfaction.  Thank you for the opportunity to participate in this patient's care.   -- Marilynne Drivers Rosana Hoes, MD, [place]: [facility] - Partnering for exceptional care. Office: [phone]

## 2017-01-31 NOTE — Progress Notes (Signed)
Pt's heart was in the high 120's to 130. Pt refused the Metoprolol iv 2.5 mg. Pt stated that her "normal" heart rate is in the 110's to 120's. Continue to monitor

## 2017-01-31 NOTE — Progress Notes (Signed)
Pharmacy Antibiotic Note  Anna [name] is a 51 y.o. female admitted on 01/29/2017 with sepsis due to intraabdominal infection.  Pharmacy has been consulted for meropenem and fluconazole dosing.  Plan: Continue fluconazole  400 mg IV q24hrs through 6/28. Patient may be able to be changed to fluconazole oral on 6/9.  Will continue meropenem 1g IV q8h through 6/28  Height: 5\' 3"  (160 cm) Weight: 121 lb (54.9 kg) IBW/kg (Calculated) : 52.4  Temp (24hrs), Avg:99.9 F (37.7 C), Min:98.8 F (37.1 C), Max:100.2 F (37.9 C)   Recent Labs Lab 01/29/17 0058 01/29/17 1714 01/30/17 0100 01/31/17 0536  WBC  --  19.4* 18.0* 17.8*  CREATININE  --  0.38* 0.34* 0.43*  LATICACIDVEN 0.8 1.0  --   --     Estimated Creatinine Clearance: 68 mL/min (A) (by C-G formula based on SCr of 0.43 mg/dL (L)).    Allergies  Allergen Reactions  . Cefepime Rash  . Penicillins Rash    As a child. Has patient had a PCN reaction causing immediate rash, facial/tongue/throat swelling, SOB or lightheadedness with hypotension: Unknown Has patient had a PCN reaction causing severe rash involving mucus membranes or skin necrosis: Unknown Has patient had a PCN reaction that required hospitalization: No Has patient had a PCN reaction occurring within the last 10 years: No If all of the above answers are "NO", then may proceed with Cephalosporin use.      Thank you for allowing pharmacy to be a part of this patient's care.  MLS 01/31/2017

## 2017-01-31 NOTE — Progress Notes (Signed)
Patient ID: Anna [name], female   DOB: 18-Jul-1966, 51 y.o.   MRN: [id number] I have evaluated the clinical chart and recent imaging in the evaluation of the pelvic drain and utility of repositioning or new drain placement. Since 12/30/16, multiple CT scan demonstrate near complete resolution of drainable abscesses in the pelvis. The pelvic drain remains in place adjacent to the bladder dome and uterus/pelvic mass. There are a few nonspecific fluid pockets deep in the posterior pelvis containing less than 1 cc. The drains are functioning. Repositioning or new drain placement is not technically feasible. It is not indicated at this time unless a large fluid collection re-accumulates.

## 2017-01-31 NOTE — Evaluation (Signed)
Physical Therapy Evaluation Patient Details Name: Anna [name] MRN: [id number] DOB: Aug 24, 1966 Today's Date: 01/31/2017   History of Present Illness  presented to ER secondary to fever, tachycardia; admitted with sepsis related to intra-abdominal abscess.  Of note, patient with known metastatic adenocarcinoma (unknown primary), status post exploratory laparoscopy 5/8 due to perforation of sigmoid colon; complicated by intra-abdominal abscess.  Currently with JP drain (x2) on R, midline abdominal wound vac.  Clinical Impression  Upon evaluation, patient alert and oriented; follows all commands and demonstrates good insight/safety awareness.  bilat UE/LE strength and ROM grossly symmetrical and WFL for basic transfers and mobility.  Able to complete sit/stand, basic transfers and gait (40') without assist device, cga/min assist.  Somewhat guarded posturing with slow, cautious stepping pattern. HR elevation to upper-140s with minimal activity (asymptomatic); additional distance/activity deferred as result. Will maintain on caseload to promote continued mobility and assess HR response to activity throughout remaining hospitalization; anticipate no formal PT needs upon discharge    Follow Up Recommendations No PT follow up (will maintain on caseload to promote continued mobility and assess HR response to activity; anticipate no formal PT needs upon discharge)    Equipment Recommendations       Recommendations for Other Services       Precautions / Restrictions Precautions Precautions: Fall Precaution Comments: JP drain x2, wound vac Restrictions Weight Bearing Restrictions: No      Mobility  Bed Mobility               General bed mobility comments: seated in recliner beginning/end of treatment session  Transfers Overall transfer level: Needs assistance   Transfers: Sit to/from Stand Sit to Stand: Supervision;Min guard            Ambulation/Gait Ambulation/Gait  assistance: Min guard;Min assist Ambulation Distance (Feet): 40 Feet Assistive device: None       General Gait Details: guarded trunk posturing with decreased arm swing, trunk rotation; slow, cautious steps with increased sway throughout gait cycle  Stairs            Wheelchair Mobility    Modified Rankin (Stroke Patients Only)       Balance Overall balance assessment: Needs assistance Sitting-balance support: No upper extremity supported;Feet supported Sitting balance-Leahy Scale: Good     Standing balance support: No upper extremity supported Standing balance-Leahy Scale: Fair                               Pertinent Vitals/Pain Pain Assessment: No/denies pain    Home Living Family/patient expects to be discharged to:: Private residence Living Arrangements: Spouse/significant other Available Help at Discharge: Family;Available 24 hours/day Type of Home: House Home Access: Stairs to enter Entrance Stairs-Rails: None Entrance Stairs-Number of Steps: 3 Home Layout: One level        Prior Function Level of Independence: Independent         Comments: Indep with ADLs, household and community mobility without assist device; working full-time in [employer].     Hand Dominance        Extremity/Trunk Assessment   Upper Extremity Assessment Upper Extremity Assessment: Overall WFL for tasks assessed    Lower Extremity Assessment Lower Extremity Assessment: Overall WFL for tasks assessed (grossly at least 4-/5 throughout)       Communication   Communication: No difficulties  Cognition Arousal/Alertness: Awake/alert Behavior During Therapy: WFL for tasks assessed/performed Overall Cognitive Status: Within Functional Limits for tasks assessed  General Comments      Exercises     Assessment/Plan    PT Assessment Patient needs continued PT services  PT Problem List Decreased  activity tolerance;Decreased balance;Decreased mobility       PT Treatment Interventions DME instruction;Gait training;Stair training;Functional mobility training;Therapeutic activities;Therapeutic exercise;Balance training;Patient/family education    PT Goals (Current goals can be found in the Care Plan section)  Acute Rehab PT Goals Patient Stated Goal: to go home PT Goal Formulation: With patient/family Time For Goal Achievement: 02/14/17 Potential to Achieve Goals: Good    Frequency Min 2X/week   Barriers to discharge        Co-evaluation               AM-PAC PT "6 Clicks" Daily Activity  Outcome Measure Difficulty turning over in bed (including adjusting bedclothes, sheets and blankets)?: None Difficulty moving from lying on back to sitting on the side of the bed? : None Difficulty sitting down on and standing up from a chair with arms (e.g., wheelchair, bedside commode, etc,.)?: A Little Help needed moving to and from a bed to chair (including a wheelchair)?: A Little Help needed walking in hospital room?: A Little Help needed climbing 3-5 steps with a railing? : A Little 6 Click Score: 20    End of Session   Activity Tolerance: Patient tolerated treatment well Patient left: in chair;with call bell/phone within reach;with family/visitor present Nurse Communication: Mobility status PT Visit Diagnosis: Difficulty in walking, not elsewhere classified (R26.2)    Time: 6295-2841 PT Time Calculation (min) (ACUTE ONLY): 12 min   Charges:   PT Evaluation $PT Eval Low Complexity: 1 Procedure     PT G Codes:        Leilah Polimeni H. Owens Shark, PT, DPT, NCS 01/31/17, 3:54 PM [phone]

## 2017-01-31 NOTE — Progress Notes (Signed)
[place] [facility] [place] NAME: Anna [name]    MR#:  [id number]  DATE OF BIRTH:  08-Mar-1966  SUBJECTIVE:   Patient here due to fever and suspected sepsis.  Some low grade fever and 1/2 blood culture + for Staph species.  Discussed with ID and will repeat BC today. No complaints.   REVIEW OF SYSTEMS:    Review of Systems  Constitutional: Negative for chills and fever.  HENT: Negative for congestion and tinnitus.   Eyes: Negative for blurred vision and double vision.  Respiratory: Negative for cough, shortness of breath and wheezing.   Cardiovascular: Negative for chest pain, orthopnea and PND.  Gastrointestinal: Negative for abdominal pain, diarrhea, nausea and vomiting.  Genitourinary: Negative for dysuria and hematuria.  Neurological: Negative for dizziness, sensory change and focal weakness.  All other systems reviewed and are negative.   Nutrition: heart Healthy Tolerating Diet: Yes Tolerating PT: Await Eval.    DRUG ALLERGIES:   Allergies  Allergen Reactions  . Cefepime Rash  . Penicillins Rash    As a child. Has patient had a PCN reaction causing immediate rash, facial/tongue/throat swelling, SOB or lightheadedness with hypotension: Unknown Has patient had a PCN reaction causing severe rash involving mucus membranes or skin necrosis: Unknown Has patient had a PCN reaction that required hospitalization: No Has patient had a PCN reaction occurring within the last 10 years: No If all of the above answers are "NO", then may proceed with Cephalosporin use.     VITALS:  Blood pressure 113/76, pulse (!) 134, temperature 98.8 F (37.1 C), temperature source Oral, resp. rate 20, height 5\' 3"  (1.6 m), weight 54.9 kg (121 lb), last menstrual period 03/01/2015, SpO2 99 %.  PHYSICAL EXAMINATION:   Physical Exam  GENERAL:  51 y.o.-year-old patient lying in bed in no acute distress.  EYES: Pupils equal, round, reactive to light and  accommodation. No scleral icterus. Extraocular muscles intact.  HEENT: Head atraumatic, normocephalic. Oropharynx and nasopharynx clear.  NECK:  Supple, no jugular venous distention. No thyroid enlargement, no tenderness.  LUNGS: Normal breath sounds bilaterally, no wheezing, rales, rhonchi. No use of accessory muscles of respiration.  CARDIOVASCULAR: S1, S2 Tachy. No murmurs, rubs, or gallops.  ABDOMEN: Soft, nontender, nondistended. Bowel sounds present. No organomegaly or mass. + mid-abdomen wound vac in place.   EXTREMITIES: No cyanosis, clubbing or edema b/l.    NEUROLOGIC: Cranial nerves II through XII are intact. No focal Motor or sensory deficits b/l.   PSYCHIATRIC: The patient is alert and oriented x 3.  SKIN: No obvious rash, lesion, or ulcer.    LABORATORY PANEL:   CBC  Recent Labs Lab 01/31/17 0536  WBC 17.8*  HGB 7.4*  HCT 23.1*  PLT 630*   ------------------------------------------------------------------------------------------------------------------  Chemistries   Recent Labs Lab 01/29/17 1714 01/30/17 0100 01/31/17 0536  NA 132* 134* 135  K 3.1* 2.7* 4.0  CL 93* 97* 99*  CO2 30 29 29   GLUCOSE 96 99 80  BUN 11 5* 6  CREATININE 0.38* 0.34* 0.43*  CALCIUM 8.0* 7.7* 7.9*  MG  --  1.8  --   AST 78*  --   --   ALT 62*  --   --   ALKPHOS 90  --   --   BILITOT 0.7  --   --    ------------------------------------------------------------------------------------------------------------------  Cardiac Enzymes No results for input(s): TROPONINI in the last 168 hours. ------------------------------------------------------------------------------------------------------------------  RADIOLOGY:  Dg Chest 2 View  Result Date: 01/29/2017 CLINICAL DATA:  51 year old with metastatic colon cancer, presenting with fever. Current history of pulmonary emboli. EXAM: CHEST  2 VIEW COMPARISON:  CTA chest 01/23/2017.  Chest x-ray 01/23/2017. FINDINGS: AP semi-erect and  lateral images were obtained. Cardiomediastinal silhouette unremarkable, unchanged. Multiple bilateral lung nodules as identified on the 01/23/2017 examinations. Mild atelectasis in the lower lobes as noted on the prior CT. No new pulmonary parenchymal abnormalities. No visible pleural effusions. Left arm PICC tip in the lower SVC. IMPRESSION: 1. Stable atelectasis involving the lower lobes since the CT 01/23/2017. 2. Multiple bilateral pulmonary metastases as noted on the CT. 3. No new/acute cardiopulmonary abnormalities. Electronically Signed   By: Evangeline Dakin M.D.   On: 01/29/2017 18:17   Ct Angio Chest Pe W Or Wo Contrast  Result Date: 01/29/2017 CLINICAL DATA:  Fever, tachycardia. EXAM: CT ANGIOGRAPHY CHEST CT ABDOMEN AND PELVIS WITH CONTRAST TECHNIQUE: Multidetector CT imaging of the chest was performed using the standard protocol during bolus administration of intravenous contrast. Multiplanar CT image reconstructions and MIPs were obtained to evaluate the vascular anatomy. Multidetector CT imaging of the abdomen and pelvis was performed using the standard protocol during bolus administration of intravenous contrast. CONTRAST:  100 mL of Isovue 370 intravenously. COMPARISON:  CT scan of Jan 21, 2017. FINDINGS: CTA CHEST FINDINGS Cardiovascular: Satisfactory opacification of the pulmonary arteries to the segmental level. No evidence of pulmonary embolism. Normal heart size. No pericardial effusion. Mediastinum/Nodes: No enlarged mediastinal, hilar, or axillary lymph nodes. Thyroid gland, trachea, and esophagus demonstrate no significant findings. Lungs/Pleura: Multiple nodular densities are noted throughout both lungs consistent with metastatic disease. Minimal bibasilar subsegmental atelectasis is noted. No pneumothorax or pleural effusion is noted. Musculoskeletal: No chest wall abnormality. No acute or significant osseous findings. Review of the MIP images confirms the above findings. CT ABDOMEN and  PELVIS FINDINGS Hepatobiliary: The gallstones are noted. Stable hepatic lesions are noted consistent with metastatic disease. Pancreas: Unremarkable. No pancreatic ductal dilatation or surrounding inflammatory changes. Spleen: Normal in size without focal abnormality. Adrenals/Urinary Tract: Adrenal glands appear normal. No hydronephrosis or renal obstruction is noted. No renal or ureteral calculi are noted. Urinary bladder is unremarkable. Stable complex low density is noted posteriorly in the left renal cortex concerning for infection, infarction or possibly metastatic disease. Stomach/Bowel: There is no evidence of bowel obstruction. 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 Vascular/Lymphatic: No significant vascular abnormality is noted. Left periaortic adenopathy is noted consistent with metastatic disease. The largest measures 15 mm. Reproductive: Reportedly, both ovaries have been removed. Probable fibroid uterus is noted. Continued presence of inflammatory changes seen around the uterus in the pelvis with multiple small fluid collections, the largest measuring 33 x 21 mm. These do not appear to be significantly changed compared to prior exam. Other: Surgical drain remains in the subhepatic space. No fluid collection is noted around this drain. Continued and stable presence of pigtail drainage catheter seen in the right lower quadrant with no significant fluid collection noted around it. Midline surgical incision is noted. Musculoskeletal: No acute or significant osseous findings. Review of the MIP images confirms the above findings. IMPRESSION: No definite evidence of pulmonary embolus. Continued presence of multiple pulmonary and hepatic metastatic lesions. Stable complex low density seen posteriorly in left renal cortex concerning for infection, infarction or possibly metastatic lesion. Left periaortic adenopathy is noted with largest lymph node measuring 15 mm. This is consistent with metastatic  disease. Continued presence of surgical drain seen in subhepatic space  and pigtail drainage catheter in the right lower quadrant, with no significant fluid collection is noted around either catheter. Continued presence of inflammatory changes in the pelvis adjacent to the fibroid uterus, with multiple small complex fluid collections which are not significantly changed compared to prior exam. Electronically Signed   By: Marijo Conception, M.D.   On: 01/29/2017 20:38   Ct Abdomen Pelvis W Contrast  Result Date: 01/29/2017 CLINICAL DATA:  Fever, tachycardia. EXAM: CT ANGIOGRAPHY CHEST CT ABDOMEN AND PELVIS WITH CONTRAST TECHNIQUE: Multidetector CT imaging of the chest was performed using the standard protocol during bolus administration of intravenous contrast. Multiplanar CT image reconstructions and MIPs were obtained to evaluate the vascular anatomy. Multidetector CT imaging of the abdomen and pelvis was performed using the standard protocol during bolus administration of intravenous contrast. CONTRAST:  100 mL of Isovue 370 intravenously. COMPARISON:  CT scan of Jan 21, 2017. FINDINGS: CTA CHEST FINDINGS Cardiovascular: Satisfactory opacification of the pulmonary arteries to the segmental level. No evidence of pulmonary embolism. Normal heart size. No pericardial effusion. Mediastinum/Nodes: No enlarged mediastinal, hilar, or axillary lymph nodes. Thyroid gland, trachea, and esophagus demonstrate no significant findings. Lungs/Pleura: Multiple nodular densities are noted throughout both lungs consistent with metastatic disease. Minimal bibasilar subsegmental atelectasis is noted. No pneumothorax or pleural effusion is noted. Musculoskeletal: No chest wall abnormality. No acute or significant osseous findings. Review of the MIP images confirms the above findings. CT ABDOMEN and PELVIS FINDINGS Hepatobiliary: The gallstones are noted. Stable hepatic lesions are noted consistent with metastatic disease. Pancreas:  Unremarkable. No pancreatic ductal dilatation or surrounding inflammatory changes. Spleen: Normal in size without focal abnormality. Adrenals/Urinary Tract: Adrenal glands appear normal. No hydronephrosis or renal obstruction is noted. No renal or ureteral calculi are noted. Urinary bladder is unremarkable. Stable complex low density is noted posteriorly in the left renal cortex concerning for infection, infarction or possibly metastatic disease. Stomach/Bowel: There is no evidence of bowel obstruction. 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 Vascular/Lymphatic: No significant vascular abnormality is noted. Left periaortic adenopathy is noted consistent with metastatic disease. The largest measures 15 mm. Reproductive: Reportedly, both ovaries have been removed. Probable fibroid uterus is noted. Continued presence of inflammatory changes seen around the uterus in the pelvis with multiple small fluid collections, the largest measuring 33 x 21 mm. These do not appear to be significantly changed compared to prior exam. Other: Surgical drain remains in the subhepatic space. No fluid collection is noted around this drain. Continued and stable presence of pigtail drainage catheter seen in the right lower quadrant with no significant fluid collection noted around it. Midline surgical incision is noted. Musculoskeletal: No acute or significant osseous findings. Review of the MIP images confirms the above findings. IMPRESSION: No definite evidence of pulmonary embolus. Continued presence of multiple pulmonary and hepatic metastatic lesions. Stable complex low density seen posteriorly in left renal cortex concerning for infection, infarction or possibly metastatic lesion. Left periaortic adenopathy is noted with largest lymph node measuring 15 mm. This is consistent with metastatic disease. Continued presence of surgical drain seen in subhepatic space and pigtail drainage catheter in the right lower quadrant, with no  significant fluid collection is noted around either catheter. Continued presence of inflammatory changes in the pelvis adjacent to the fibroid uterus, with multiple small complex fluid collections which are not significantly changed compared to prior exam. Electronically Signed   By: Marijo Conception, M.D.   On: 01/29/2017 20:38     ASSESSMENT  AND PLAN:   51 year old female with past medical history of metastatic carcinomatosis with unknown primary, hx of DVT, recent admission due to sepsis secondary to pelvic abscesses status post PICC line placement on long term antibiotics with drain placement, who presented to the hospital from home due to fever.  1. Sepsis-patient will then given her fever, tachycardia, leukocytosis. She was recently admitted to the hospital for pelvic abscesses and had a drain placed in the right lower quadrant and was placed on long-term IV antibiotics with meropenem and now returns with fever.  -Off vancomycin as MRSA PCR is negative. Continue meropenem for now. Appreciate infectious disease input and Diflucan added. One out of 2 blood cultures positive for staph species methicillin resistant. This could be a contaminant. Repeat blood cultures as per infectious disease. -Both surgery and infectious disease discussed plan with interventional radiology and no plans for repositioning the percutaneous tube for further drainage of the abscess and CT scan is not showing a large collection or phlegmon. Continue current antibiotics and supportive care for now.   2. Metastatic carcinomatosis with unknown primary-patient has adenocarcinoma but primary has not been identified. Patient has had extensive metastatic disease. -follows w/ Dr. Rogue Bussing and plan for starting treatment with immune modulators when stable.   3. History of recent DVT-continue Eliquis  4. Essential HTN/Tachycardia - cont. Toprol.   5. Hypokalemia - improved and resolved w/ supplementation.  - Mg. Level normal.    Discussed plan of care with Infectious Disease.   All the records are reviewed and case discussed with Care Management/Social Worker. Management plans discussed with the patient, family and they are in agreement.  CODE STATUS: Full code  DVT Prophylaxis: Eliquis  TOTAL TIME TAKING CARE OF THIS PATIENT: 53minutes.   POSSIBLE D/C IN 1-2 DAYS, DEPENDING ON CLINICAL CONDITION.   Henreitta Leber M.D on 01/31/2017 at 12:23 PM  Between 7am to 6pm - Pager - [phone]  After 6pm go to [email or web address] - Proofreader  [facility]  Office  [phone]  CC: Primary care physician; McLean-Scocuzza, Nino Glow, MD

## 2017-01-31 NOTE — Care Management Note (Signed)
Case Management Note  Patient Details  Name: Anna [name] MRN: [id number] Date of Birth: Jun 08, 1966  Subjective/Objective:     Admitted to [facility] with the diagnosis of sepsis. Lives with husband, [name] [phone]). Last seen Dr, McLean-Scocuzza 01/17/17. Prescriptions are filled at [facility]. No falls. Decreased appetite. Perforated Sigmoid Colon 01/10/17. 2 JP drains. Wound Vac. Home antibiotics. No equipment.                Action/Plan:  Physical therapy evaluation pending.  Home antibiotics per [facility]. Wound Care per [facility]   Expected Discharge Date:  01/31/17               Expected Discharge Plan:     In-House Referral:     Discharge planning Services   yes  Post Acute Care Choice:   yes Choice offered to:   last admission  DME Arranged:   No DME Agency:     [place] Arranged:   last admission [facility] Agency:   [place]  Status of Service:   in progress  If discussed at [place] of Stay Meetings, dates discussed:    Additional Comments:  Shelbie Ammons, RN MSN CCM Care Management [phone] 01/31/2017, 8:43 AM

## 2017-02-01 LAB — CULTURE, BLOOD (ROUTINE X 2): Special Requests: ADEQUATE

## 2017-02-01 LAB — CBC
HCT: 21.4 % — ABNORMAL LOW (ref 35.0–47.0)
HEMOGLOBIN: 7 g/dL — AB (ref 12.0–16.0)
MCH: 29.1 pg (ref 26.0–34.0)
MCHC: 32.7 g/dL (ref 32.0–36.0)
MCV: 88.9 fL (ref 80.0–100.0)
PLATELETS: 559 10*3/uL — AB (ref 150–440)
RBC: 2.41 MIL/uL — AB (ref 3.80–5.20)
RDW: 16.5 % — ABNORMAL HIGH (ref 11.5–14.5)
WBC: 17.1 10*3/uL — ABNORMAL HIGH (ref 3.6–11.0)

## 2017-02-01 LAB — CK: CK TOTAL: 10 U/L — AB (ref 38–234)

## 2017-02-01 MED ORDER — METOPROLOL TARTRATE 25 MG PO TABS
25.0000 mg | ORAL_TABLET | Freq: Two times a day (BID) | ORAL | Status: DC
  Administered 2017-02-01: 25 mg via ORAL
  Filled 2017-02-01: qty 1

## 2017-02-01 MED ORDER — SODIUM CHLORIDE 0.9 % IV SOLN
8.0000 mg/kg | INTRAVENOUS | Status: DC
  Administered 2017-02-01: 419 mg via INTRAVENOUS
  Filled 2017-02-01: qty 8.38

## 2017-02-01 MED ORDER — SODIUM CHLORIDE 0.9 % IV SOLN
425.0000 mg | INTRAVENOUS | Status: DC
  Administered 2017-02-02 – 2017-02-04 (×3): 425 mg via INTRAVENOUS
  Filled 2017-02-01 (×3): qty 8.5

## 2017-02-01 NOTE — Progress Notes (Addendum)
SURGICAL PROGRESS NOTE (cpt [phone])  Hospital Day(s): 3.   Post op day(s):  Anna [name]   Interval History: Patient seen and examined, no acute events or new complaints overnight. Patient reports feeling well despite recent fever this morning (101.3 F), denies abdominal pain, N/V, subjective fever/chills, CP, or SOB.  Review of Systems:  Constitutional: denies fever, chills  HEENT: denies cough or congestion  Respiratory: denies any shortness of breath  Cardiovascular: denies chest pain or palpitations  Gastrointestinal: abdominal pain, N/V, and bowel function as per interval history Genitourinary: denies burning with urination or urinary frequency Musculoskeletal: denies pain, decreased motor or sensation Integumentary: denies any other rashes or skin discolorations Neurological: denies HA or vision/hearing changes   Vital signs in last 24 hours: [min-max] current  Temp:  [98.7 F (37.1 C)-101.3 F (38.5 C)] 98.7 F (37.1 C) (06/09 0616) Pulse Rate:  [130-149] 130 (06/09 0442) Resp:  [18-20] 20 (06/09 0442) BP: (111-121)/(53-76) 111/53 (06/09 0442) SpO2:  [97 %-100 %] 97 % (06/09 0442)     Height: 5\' 3"  (160 cm) Weight: 121 lb (54.9 kg) BMI (Calculated): 21.5   Intake/Output this shift:  Total I/O In: 2290 [P.O.:30; I.V.:2150; Other:10; IV Piggyback:100] Out: 2704 [Urine:2700; Drains:4]   Intake/Output last 2 shifts:  @IOLAST2SHIFTS @   Physical Exam:  Constitutional: alert, cooperative and no distress  HENT: normocephalic without obvious abnormality  Eyes: PERRL, EOM's grossly intact and symmetric  Neuro: CN II - XII grossly intact and symmetric without deficit  Respiratory: breathing non-labored at rest  Cardiovascular: regular rate and sinus rhythm  Gastrointestinal: soft, non-tender, and non-distended, midline VAC dressing well-secured, JP x2 well-secured with small volumes of creamy white fluid in each bulb and no surrounding erythema Musculoskeletal: UE and LE FROM, no  edema or wounds, motor and sensation grossly intact, NT   Labs:  CBC Latest Ref Rng & Units 02/01/2017 01/31/2017 01/30/2017  WBC 3.6 - 11.0 K/uL 17.1(H) 17.8(H) 18.0(H)  Hemoglobin 12.0 - 16.0 g/dL 7.0(L) 7.4(L) 7.5(L)  Hematocrit 35.0 - 47.0 % 21.4(L) 23.1(L) 22.9(L)  Platelets 150 - 440 K/uL 559(H) 630(H) 630(H)   CMP Latest Ref Rng & Units 01/31/2017 01/30/2017 01/29/2017  Glucose 65 - 99 mg/dL 80 99 96  BUN 6 - 20 mg/dL 6 5(L) 11  Creatinine 0.44 - 1.00 mg/dL 0.43(L) 0.34(L) 0.38(L)  Sodium 135 - 145 mmol/L 135 134(L) 132(L)  Potassium 3.5 - 5.1 mmol/L 4.0 2.7(LL) 3.1(L)  Chloride 101 - 111 mmol/L 99(L) 97(L) 93(L)  CO2 22 - 32 mmol/L 29 29 30   Calcium 8.9 - 10.3 mg/dL 7.9(L) 7.7(L) 8.0(L)  Total Protein 6.5 - 8.1 g/dL - - 6.1(L)  Total Bilirubin 0.3 - 1.2 mg/dL - - 0.7  Alkaline Phos 38 - 126 U/L - - 90  AST 15 - 41 U/L - - 78(H)  ALT 14 - 54 U/L - - 62(H)   Microbiology: Blood Cultures (01/31/2017)    bottle 1 of 2: No growth x 24 hours (though specimen volume cited as too low for accurate Aerococcus speciation)    bottle 2 of 2: No growth x <12 hours  Imaging studies: No new pertinent imaging studies   Assessment/Plan: 51 y.o.femalewith chronic leukocytosis, low-grade fevers, and tachycardia associated with pelvic phlegmon per IR + pelvic abscesses s/p drainage on IV meropenum via PICC, coag-negative staph cultured from 1 of 2 sets of blood cultures (6/6) being repeated to confirm vs contaminant, 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  - continue antibiotics per ID, f/u repeat cultures - immunotherapy, chemotherapy as per oncology             - discussed with IR pelvic phlegmon, no fluid collections to be drained - anticoagulation ongoing for PE's (no longer visualized on CTA chest  on 6/7)             - wound care team to change VAC today; please call if any issues with changing home wound VAC - medical management and discharge planning per medical team             - no indication for any surgical intervention at this time - will continue to follow, please call with any questions - continue regular diet as tolerated  All of the above findings and recommendations were discussed with the patient, and all of patient's questions were answered to her expressed satisfaction.  Thank you for the opportunity to participate in this patient's care.   -- Marilynne Drivers Rosana Hoes, MD, [facility]: [facility] - Partnering for exceptional care. Office: [phone]

## 2017-02-01 NOTE — Progress Notes (Signed)
Pharmacy Antibiotic Note  Anna [name] is a 50 y.o. female admitted on 01/29/2017 with bacteremia.  Pharmacy has been consulted for daptomycin dosing.  Plan: Daptomycin 8 mg/kg IBW once daily. Ordered baseline CPK and repeat tomorrow with AM labs.   Height: 5\' 3"  (160 cm) Weight: 121 lb (54.9 kg) IBW/kg (Calculated) : 52.4  Temp (24hrs), Avg:100 F (37.8 C), Min:98.6 F (37 C), Max:101.4 F (38.6 C)   Recent Labs Lab 01/29/17 0058 01/29/17 1714 01/30/17 0100 01/31/17 0536 02/01/17 0540  WBC  --  19.4* 18.0* 17.8* 17.1*  CREATININE  --  0.38* 0.34* 0.43*  --   LATICACIDVEN 0.8 1.0  --   --   --     Estimated Creatinine Clearance: 68 mL/min (A) (by C-G formula based on SCr of 0.43 mg/dL (L)).    Allergies  Allergen Reactions  . Cefepime Rash  . Penicillins Rash    As a child. Has patient had a PCN reaction causing immediate rash, facial/tongue/throat swelling, SOB or lightheadedness with hypotension: Unknown Has patient had a PCN reaction causing severe rash involving mucus membranes or skin necrosis: Unknown Has patient had a PCN reaction that required hospitalization: No Has patient had a PCN reaction occurring within the last 10 years: No If all of the above answers are "NO", then may proceed with Cephalosporin use.    Thank you for allowing pharmacy to be a part of this patient's care.  Laural Benes, Pharm.D., BCPS Clinical Pharmacist 02/01/2017 2:50 PM

## 2017-02-01 NOTE — Progress Notes (Signed)
Patient ID: Anna [name], female   DOB: 1965-09-03, 51 y.o.   MRN: [id number]  [employer] PROGRESS NOTE  Anna [name] [name] NTZ:[id number] DOB: Jan 06, 1966 DOA: 01/29/2017 PCP: McLean-Scocuzza, Nino Glow, MD  HPI/Subjective: Patient seen earlier and wanted to go home. Patient did have a fever this morning and again another fever this afternoon.  Objective: Vitals:   02/01/17 1250 02/01/17 1426  BP:  (!) 118/59  Pulse: (!) 127 (!) 127  Resp:  18  Temp:  (!) 101.4 F (38.6 C)    Filed Weights   01/29/17 1703  Weight: 54.9 kg (121 lb)    ROS: Review of Systems  Constitutional: Positive for fever. Negative for chills.  Eyes: Negative for blurred vision.  Respiratory: Negative for cough and shortness of breath.   Cardiovascular: Negative for chest pain.  Gastrointestinal: Negative for abdominal pain, constipation, diarrhea, nausea and vomiting.  Genitourinary: Negative for dysuria.  Musculoskeletal: Negative for joint pain.  Neurological: Negative for dizziness and headaches.   Exam: Physical Exam  Constitutional: She is oriented to person, place, and time.  HENT:  Nose: No mucosal edema.  Mouth/Throat: No oropharyngeal exudate or posterior oropharyngeal edema.  Eyes: EOM and lids are normal. Pupils are equal, round, and reactive to light.  Conjunctiva pale  Neck: No JVD present. Carotid bruit is not present. No edema present. No thyroid mass and no thyromegaly present.  Cardiovascular: S1 normal, S2 normal and normal heart sounds.  Tachycardia present.  Exam reveals no gallop.   No murmur heard. Pulses:      Dorsalis pedis pulses are 2+ on the right side, and 2+ on the left side.  Respiratory: No respiratory distress. She has no wheezes. She has no rhonchi. She has no rales.  GI: Soft. Bowel sounds are normal. There is no tenderness.  Musculoskeletal:       Right ankle: She exhibits no swelling.       Left ankle: She exhibits no swelling.  Lymphadenopathy:    She has no cervical adenopathy.  Neurological: She is alert and oriented to person, place, and time. No cranial nerve deficit.  Skin: Skin is warm. No rash noted. Nails show no clubbing.  Psychiatric: She has a normal mood and affect.      Data Reviewed: Basic Metabolic Panel:  Recent Labs Lab 01/29/17 1714 01/30/17 0100 01/31/17 0536  NA 132* 134* 135  K 3.1* 2.7* 4.0  CL 93* 97* 99*  CO2 30 29 29   GLUCOSE 96 99 80  BUN 11 5* 6  CREATININE 0.38* 0.34* 0.43*  CALCIUM 8.0* 7.7* 7.9*  MG  --  1.8  --   PHOS  --  2.0*  --    Liver Function Tests:  Recent Labs Lab 01/29/17 1714  AST 78*  ALT 62*  ALKPHOS 90  BILITOT 0.7  PROT 6.1*  ALBUMIN 2.5*   CBC:  Recent Labs Lab 01/29/17 1714 01/30/17 0100 01/31/17 0536 02/01/17 0540  WBC 19.4* 18.0* 17.8* 17.1*  NEUTROABS 17.3*  --   --   --   HGB 8.7* 7.5* 7.4* 7.0*  HCT 26.7* 22.9* 23.1* 21.4*  MCV 87.8 88.0 88.7 88.9  PLT 627* 630* 630* 559*     Recent Results (from the past 240 hour(s))  Culture, blood (Routine x 2)     Status: None (Preliminary result)   Collection Time: 01/29/17  5:14 PM  Result Value Ref Range Status   Specimen Description BLOOD RAC  Final   Special Requests  Final    BOTTLES DRAWN AEROBIC AND ANAEROBIC Blood Culture adequate volume   Culture NO GROWTH 3 DAYS  Final   Report Status PENDING  Incomplete  Culture, blood (Routine x 2)     Status: Abnormal   Collection Time: 01/29/17  5:14 PM  Result Value Ref Range Status   Specimen Description BLOOD LARM  Final   Special Requests   Final    BOTTLES DRAWN AEROBIC AND ANAEROBIC Blood Culture adequate volume   Culture  Setup Time   Final    GRAM POSITIVE COCCI AEROBIC BOTTLE ONLY CRITICAL RESULT CALLED TO, READ BACK BY AND VERIFIED WITH: KAREN HAYES 01/31/17 0909 SGD    Culture (A)  Final    STAPHYLOCOCCUS SPECIES (COAGULASE NEGATIVE) THE SIGNIFICANCE OF ISOLATING THIS ORGANISM FROM A SINGLE SET OF BLOOD CULTURES WHEN MULTIPLE SETS ARE  DRAWN IS UNCERTAIN. PLEASE NOTIFY THE MICROBIOLOGY DEPARTMENT WITHIN ONE WEEK IF SPECIATION AND SENSITIVITIES ARE REQUIRED. Performed at [facility], [place] [address] [place], [place] [address]    Report Status 02/01/2017 FINAL  Final  Blood Culture ID Panel (Reflexed)     Status: Abnormal   Collection Time: 01/29/17  5:14 PM  Result Value Ref Range Status   Enterococcus species NOT DETECTED NOT DETECTED Final   Listeria monocytogenes NOT DETECTED NOT DETECTED Final   Staphylococcus species DETECTED (A) NOT DETECTED Final    Comment: Methicillin (oxacillin) resistant coagulase negative staphylococcus. Possible blood culture contaminant (unless isolated from more than one blood culture draw or clinical case suggests pathogenicity). No antibiotic treatment is indicated for blood  culture contaminants. CRITICAL RESULT CALLED TO, READ BACK BY AND VERIFIED WITH: KAREN HAYES 01/31/17 0909 SGD    Staphylococcus aureus NOT DETECTED NOT DETECTED Final   Methicillin resistance DETECTED (A) NOT DETECTED Final    Comment: CRITICAL RESULT CALLED TO, READ BACK BY AND VERIFIED WITH: KAREN HAYES 01/31/17 0909 SGD    Streptococcus species NOT DETECTED NOT DETECTED Final   Streptococcus agalactiae NOT DETECTED NOT DETECTED Final   Streptococcus pneumoniae NOT DETECTED NOT DETECTED Final   Streptococcus pyogenes NOT DETECTED NOT DETECTED Final   Acinetobacter baumannii NOT DETECTED NOT DETECTED Final   Enterobacteriaceae species NOT DETECTED NOT DETECTED Final   Enterobacter cloacae complex NOT DETECTED NOT DETECTED Final   Escherichia coli NOT DETECTED NOT DETECTED Final   Klebsiella oxytoca NOT DETECTED NOT DETECTED Final   Klebsiella pneumoniae NOT DETECTED NOT DETECTED Final   Proteus species NOT DETECTED NOT DETECTED Final   Serratia marcescens NOT DETECTED NOT DETECTED Final   Haemophilus influenzae NOT DETECTED NOT DETECTED Final   Neisseria meningitidis NOT DETECTED NOT DETECTED Final    Pseudomonas aeruginosa NOT DETECTED NOT DETECTED Final   Candida albicans NOT DETECTED NOT DETECTED Final   Candida glabrata NOT DETECTED NOT DETECTED Final   Candida krusei NOT DETECTED NOT DETECTED Final   Candida parapsilosis NOT DETECTED NOT DETECTED Final   Candida tropicalis NOT DETECTED NOT DETECTED Final  MRSA PCR Screening     Status: None   Collection Time: 01/30/17  9:27 AM  Result Value Ref Range Status   MRSA by PCR NEGATIVE NEGATIVE Final    Comment:        The GeneXpert MRSA Assay (FDA approved for NASAL specimens only), is one component of a comprehensive MRSA colonization surveillance program. It is not intended to diagnose MRSA infection nor to guide or monitor treatment for MRSA infections.   Culture, blood (Routine X 2)  w Reflex to ID Panel     Status: Abnormal (Preliminary result)   Collection Time: 01/31/17  1:05 PM  Result Value Ref Range Status   Specimen Description BLOOD RIGHT HAND  Final   Special Requests (A)  Final    AEROCOCCUS SPECIES Blood Culture results may not be optimal due to an inadequate volume of blood received in culture bottles   Culture NO GROWTH < 24 HOURS  Final   Report Status PENDING  Incomplete  Culture, blood (Routine X 2) w Reflex to ID Panel     Status: None (Preliminary result)   Collection Time: 01/31/17  2:25 PM  Result Value Ref Range Status   Specimen Description BLOOD RIGHT ARM  Final   Special Requests   Final    BOTTLES DRAWN AEROBIC AND ANAEROBIC Blood Culture adequate volume   Culture NO GROWTH < 12 HOURS  Final   Report Status PENDING  Incomplete      Scheduled Meds: . apixaban  5 mg Oral BID  . metoprolol succinate  25 mg Oral Daily   Continuous Infusions: . fluconazole (DIFLUCAN) IV Stopped (01/31/17 1951)  . meropenem (MERREM) IV Stopped (02/01/17 1327)    Assessment/Plan:  1. Sepsis. Repeat blood cultures with Aerococcus. This could be a PICC line infection. Case discussed with Dr. Ola Spurr  infectious disease. Remove PICC line. Start daptomycin. Repeat blood cultures tomorrow. Patient still has percutaneous drainage tubes in her abscess in the abdomen. Patient also on fluconazole and meropenem. 2. Metastatic carcinomatosis of unknown primary. Patient has extensive metastatic disease 3. History of DVT on Eliquis 4. Essential hypertension and tachycardia area switch Toprol to metoprolol twice a day 5. Hypokalemia improved with supplementation 6. Anemia of chronic disease. Hemoglobin down to 7.0 which is on the threshold of transfusion. We'll check hemoglobin tomorrow. Stop IV fluids. May end up needing a blood transfusion.  Code Status:     Code Status Orders        Start     Ordered   01/30/17 0009  Full code  Continuous     01/30/17 0009    Code Status History    Date Active Date Inactive Code Status Order ID Comments User Context   01/21/2017  9:00 PM 01/24/2017  5:38 PM Full Code [id number]  Fritzi Mandes, MD Inpatient   12/31/2016  3:58 AM 01/10/2017  4:29 PM Full Code [id number]  Jules Husbands, MD Inpatient   12/30/2016 10:48 PM 12/31/2016  3:58 AM Full Code [id number]  Schermerhorn, Gwen Her, MD ED   12/20/2016 11:16 AM 12/21/2016  1:51 PM Full Code [id number]  Schermerhorn, Gwen Her, MD Inpatient     Family Communication: Case discussed earlier with husband at the bedside Disposition Plan: Will need to be afebrile prior to disposition  Consultants:  Surgery  Infectious disease  Antibiotics:  Daptomycin  Meropenem  Fluconazole  Time spent: 28 minutes  [place], [place]

## 2017-02-02 LAB — CBC
HEMATOCRIT: 23.6 % — AB (ref 35.0–47.0)
HEMOGLOBIN: 7.7 g/dL — AB (ref 12.0–16.0)
MCH: 28.5 pg (ref 26.0–34.0)
MCHC: 32.5 g/dL (ref 32.0–36.0)
MCV: 87.9 fL (ref 80.0–100.0)
Platelets: 632 10*3/uL — ABNORMAL HIGH (ref 150–440)
RBC: 2.69 MIL/uL — AB (ref 3.80–5.20)
RDW: 17 % — ABNORMAL HIGH (ref 11.5–14.5)
WBC: 19.2 10*3/uL — ABNORMAL HIGH (ref 3.6–11.0)

## 2017-02-02 LAB — BASIC METABOLIC PANEL
Anion gap: 6 (ref 5–15)
CHLORIDE: 99 mmol/L — AB (ref 101–111)
CO2: 30 mmol/L (ref 22–32)
Calcium: 8.1 mg/dL — ABNORMAL LOW (ref 8.9–10.3)
Creatinine, Ser: 0.38 mg/dL — ABNORMAL LOW (ref 0.44–1.00)
GFR calc Af Amer: >60 mL/min (ref >60)
GFR calc non Af Amer: >60 mL/min (ref >60)
Glucose, Bld: 97 mg/dL (ref 65–99)
POTASSIUM: 3.4 mmol/L — AB (ref 3.5–5.1)
SODIUM: 135 mmol/L (ref 135–145)

## 2017-02-02 LAB — TYPE AND SCREEN
ABO/RH(D): A POS
Antibody Screen: NEGATIVE

## 2017-02-02 LAB — CK: CK TOTAL: 10 U/L — AB (ref 38–234)

## 2017-02-02 MED ORDER — METOPROLOL TARTRATE 25 MG PO TABS
25.0000 mg | ORAL_TABLET | Freq: Three times a day (TID) | ORAL | Status: DC
  Administered 2017-02-02 – 2017-02-04 (×9): 25 mg via ORAL
  Filled 2017-02-02 (×9): qty 1

## 2017-02-02 MED ORDER — POTASSIUM CHLORIDE CRYS ER 20 MEQ PO TBCR
40.0000 meq | EXTENDED_RELEASE_TABLET | Freq: Once | ORAL | Status: AC
  Administered 2017-02-02: 40 meq via ORAL
  Filled 2017-02-02: qty 2

## 2017-02-02 NOTE — Progress Notes (Signed)
Patient ID: Anna [name], female   DOB: 09-30-1965, 51 y.o.   MRN: [id number]  [place] PROGRESS NOTE  Dakari Stabler [facility] WUJ:[id number] DOB: Dec 15, 1965 DOA: 01/29/2017 PCP: McLean-Scocuzza, Nino Glow, MD  HPI/Subjective: Patient apologized to me about wanting to go home yesterday. She realizes that was looking out for her best interest. She feels okay. Has not had any further high fever. PICC line was removed yesterday.  Objective: Vitals:   02/02/17 0847 02/02/17 1259  BP: 114/65 110/68  Pulse: (!) 120 (!) 122  Resp: 18 18  Temp: 99 F (37.2 C) 98.5 F (36.9 C)    Filed Weights   01/29/17 1703  Weight: 54.9 kg (121 lb)    ROS: Review of Systems  Constitutional: Negative for chills and fever.  Eyes: Negative for blurred vision.  Respiratory: Negative for cough and shortness of breath.   Cardiovascular: Negative for chest pain.  Gastrointestinal: Negative for abdominal pain, constipation, diarrhea, nausea and vomiting.  Genitourinary: Negative for dysuria.  Musculoskeletal: Negative for joint pain.  Neurological: Negative for dizziness and headaches.   Exam: Physical Exam  Constitutional: She is oriented to person, place, and time.  HENT:  Nose: No mucosal edema.  Mouth/Throat: No oropharyngeal exudate or posterior oropharyngeal edema.  Eyes: EOM and lids are normal. Pupils are equal, round, and reactive to light.  Conjunctiva pale  Neck: No JVD present. Carotid bruit is not present. No edema present. No thyroid mass and no thyromegaly present.  Cardiovascular: S1 normal, S2 normal and normal heart sounds.  Tachycardia present.  Exam reveals no gallop.   No murmur heard. Pulses:      Dorsalis pedis pulses are 2+ on the right side, and 2+ on the left side.  Respiratory: No respiratory distress. She has no wheezes. She has no rhonchi. She has no rales.  GI: Soft. Bowel sounds are normal. There is no tenderness.  Musculoskeletal:       Right ankle: She  exhibits no swelling.       Left ankle: She exhibits no swelling.  Lymphadenopathy:    She has no cervical adenopathy.  Neurological: She is alert and oriented to person, place, and time. No cranial nerve deficit.  Skin: Skin is warm. No rash noted. Nails show no clubbing.  Psychiatric: She has a normal mood and affect.      Data Reviewed: Basic Metabolic Panel:  Recent Labs Lab 01/29/17 1714 01/30/17 0100 01/31/17 0536 02/02/17 0445  NA 132* 134* 135 135  K 3.1* 2.7* 4.0 3.4*  CL 93* 97* 99* 99*  CO2 30 29 29 30   GLUCOSE 96 99 80 97  BUN 11 5* 6 <5*  CREATININE 0.38* 0.34* 0.43* 0.38*  CALCIUM 8.0* 7.7* 7.9* 8.1*  MG  --  1.8  --   --   PHOS  --  2.0*  --   --    Liver Function Tests:  Recent Labs Lab 01/29/17 1714  AST 78*  ALT 62*  ALKPHOS 90  BILITOT 0.7  PROT 6.1*  ALBUMIN 2.5*   CBC:  Recent Labs Lab 01/29/17 1714 01/30/17 0100 01/31/17 0536 02/01/17 0540 02/02/17 0445  WBC 19.4* 18.0* 17.8* 17.1* 19.2*  NEUTROABS 17.3*  --   --   --   --   HGB 8.7* 7.5* 7.4* 7.0* 7.7*  HCT 26.7* 22.9* 23.1* 21.4* 23.6*  MCV 87.8 88.0 88.7 88.9 87.9  PLT 627* 630* 630* 559* 632*     Recent Results (from the past 240  hour(s))  Culture, blood (Routine x 2)     Status: None (Preliminary result)   Collection Time: 01/29/17  5:14 PM  Result Value Ref Range Status   Specimen Description BLOOD RAC  Final   Special Requests   Final    BOTTLES DRAWN AEROBIC AND ANAEROBIC Blood Culture adequate volume   Culture NO GROWTH 4 DAYS  Final   Report Status PENDING  Incomplete  Culture, blood (Routine x 2)     Status: Abnormal   Collection Time: 01/29/17  5:14 PM  Result Value Ref Range Status   Specimen Description BLOOD LARM  Final   Special Requests   Final    BOTTLES DRAWN AEROBIC AND ANAEROBIC Blood Culture adequate volume   Culture  Setup Time   Final    GRAM POSITIVE COCCI AEROBIC BOTTLE ONLY CRITICAL RESULT CALLED TO, READ BACK BY AND VERIFIED WITH: KAREN  HAYES 01/31/17 0909 SGD    Culture (A)  Final    STAPHYLOCOCCUS SPECIES (COAGULASE NEGATIVE) THE SIGNIFICANCE OF ISOLATING THIS ORGANISM FROM A SINGLE SET OF BLOOD CULTURES WHEN MULTIPLE SETS ARE DRAWN IS UNCERTAIN. PLEASE NOTIFY THE MICROBIOLOGY DEPARTMENT WITHIN ONE WEEK IF SPECIATION AND SENSITIVITIES ARE REQUIRED. Performed at [facility], [place] [address] [place], [place] [address]    Report Status 02/01/2017 FINAL  Final  Blood Culture ID Panel (Reflexed)     Status: Abnormal   Collection Time: 01/29/17  5:14 PM  Result Value Ref Range Status   Enterococcus species NOT DETECTED NOT DETECTED Final   Listeria monocytogenes NOT DETECTED NOT DETECTED Final   Staphylococcus species DETECTED (A) NOT DETECTED Final    Comment: Methicillin (oxacillin) resistant coagulase negative staphylococcus. Possible blood culture contaminant (unless isolated from more than one blood culture draw or clinical case suggests pathogenicity). No antibiotic treatment is indicated for blood  culture contaminants. CRITICAL RESULT CALLED TO, READ BACK BY AND VERIFIED WITH: KAREN HAYES 01/31/17 0909 SGD    Staphylococcus aureus NOT DETECTED NOT DETECTED Final   Methicillin resistance DETECTED (A) NOT DETECTED Final    Comment: CRITICAL RESULT CALLED TO, READ BACK BY AND VERIFIED WITH: KAREN HAYES 01/31/17 0909 SGD    Streptococcus species NOT DETECTED NOT DETECTED Final   Streptococcus agalactiae NOT DETECTED NOT DETECTED Final   Streptococcus pneumoniae NOT DETECTED NOT DETECTED Final   Streptococcus pyogenes NOT DETECTED NOT DETECTED Final   Acinetobacter baumannii NOT DETECTED NOT DETECTED Final   Enterobacteriaceae species NOT DETECTED NOT DETECTED Final   Enterobacter cloacae complex NOT DETECTED NOT DETECTED Final   Escherichia coli NOT DETECTED NOT DETECTED Final   Klebsiella oxytoca NOT DETECTED NOT DETECTED Final   Klebsiella pneumoniae NOT DETECTED NOT DETECTED Final   Proteus species NOT DETECTED  NOT DETECTED Final   Serratia marcescens NOT DETECTED NOT DETECTED Final   Haemophilus influenzae NOT DETECTED NOT DETECTED Final   Neisseria meningitidis NOT DETECTED NOT DETECTED Final   Pseudomonas aeruginosa NOT DETECTED NOT DETECTED Final   Candida albicans NOT DETECTED NOT DETECTED Final   Candida glabrata NOT DETECTED NOT DETECTED Final   Candida krusei NOT DETECTED NOT DETECTED Final   Candida parapsilosis NOT DETECTED NOT DETECTED Final   Candida tropicalis NOT DETECTED NOT DETECTED Final  MRSA PCR Screening     Status: None   Collection Time: 01/30/17  9:27 AM  Result Value Ref Range Status   MRSA by PCR NEGATIVE NEGATIVE Final    Comment:        The  GeneXpert MRSA Assay (FDA approved for NASAL specimens only), is one component of a comprehensive MRSA colonization surveillance program. It is not intended to diagnose MRSA infection nor to guide or monitor treatment for MRSA infections.   Culture, blood (Routine X 2) w Reflex to ID Panel     Status: Abnormal (Preliminary result)   Collection Time: 01/31/17  1:05 PM  Result Value Ref Range Status   Specimen Description BLOOD RIGHT HAND  Final   Special Requests (A)  Final    AEROCOCCUS SPECIES Blood Culture results may not be optimal due to an inadequate volume of blood received in culture bottles   Culture NO GROWTH 2 DAYS  Final   Report Status PENDING  Incomplete  Culture, blood (Routine X 2) w Reflex to ID Panel     Status: None (Preliminary result)   Collection Time: 01/31/17  2:25 PM  Result Value Ref Range Status   Specimen Description BLOOD RIGHT ARM  Final   Special Requests   Final    BOTTLES DRAWN AEROBIC AND ANAEROBIC Blood Culture adequate volume   Culture NO GROWTH 2 DAYS  Final   Report Status PENDING  Incomplete      Scheduled Meds: . apixaban  5 mg Oral BID  . metoprolol tartrate  25 mg Oral TID PC   Continuous Infusions: . DAPTOmycin (CUBICIN)  IV    . fluconazole (DIFLUCAN) IV Stopped  (02/01/17 1943)  . meropenem (MERREM) IV Stopped (02/02/17 0350)    Assessment/Plan:  1. Sepsis. Repeat blood cultures with Aerococcus. This could be a PICC line infection.  PICC line Removed yesterday. Repeat blood cultures drawn this morning. Patient still has percutaneous drainage tubes in her abscess in the abdomen. Continue daptomycin, fluconazole and meropenem. 2. Metastatic carcinomatosis of unknown primary. Patient has extensive metastatic disease 3. History of DVT on Eliquis 4. Essential hypertension and tachycardia. Increase metoprolol 25 mg 3 times a day 5. Hypokalemia improved with supplementation 6. Anemia of chronic disease. Hemoglobin up to 7.7 after stopping fluids yesterday.  Code Status:     Code Status Orders        Start     Ordered   01/30/17 0009  Full code  Continuous     01/30/17 0009    Code Status History    Date Active Date Inactive Code Status Order ID Comments User Context   01/21/2017  9:00 PM 01/24/2017  5:38 PM Full Code [id number]  Fritzi Mandes, MD Inpatient   12/31/2016  3:58 AM 01/10/2017  4:29 PM Full Code [id number]  Jules Husbands, MD Inpatient   12/30/2016 10:48 PM 12/31/2016  3:58 AM Full Code [id number]  Schermerhorn, Gwen Her, MD ED   12/20/2016 11:16 AM 12/21/2016  1:51 PM Full Code [id number]  Schermerhorn, Gwen Her, MD Inpatient     Family Communication: Case discussed Yesterday with husband Disposition Plan: Will need a repeat PICC line once blood cultures are negative for 48 hours  Consultants:  Surgery  Infectious disease  Antibiotics:  Daptomycin  Meropenem  Fluconazole  Time spent: 26 minutes  [place], [place]

## 2017-02-02 NOTE — Plan of Care (Signed)
Problem: Physical Regulation: Goal: Will remain free from infection Outcome: Not Progressing WBC 17.1 patient is currently on meropenum, daptomycin, and diflucan.

## 2017-02-02 NOTE — Progress Notes (Signed)
SURGICAL PROGRESS NOTE (cpt [phone])  Hospital Day(s): 4.   Post op day(s):  [place] Kitchen   Interval History: Patient seen and examined, no new complaints overnight since PICC removed yesterday for fevers and persistent leukocytosis in context 1 of 2 blood cultures (6/6) positive for coag-negative staph aureus. Patient otherwise continues to feel well with +flatus, hopes to be able to return home by Tuesday, denies subjective fever/chills, abdominal pain, N/V, CP, or SOB.  Review of Systems:  Constitutional: denies fever, chills  HEENT: denies cough or congestion  Respiratory: denies any shortness of breath  Cardiovascular: denies chest pain or palpitations  Gastrointestinal: abdominal pain, N/V, and bowel function as per interval history Genitourinary: denies burning with urination or urinary frequency Musculoskeletal: denies pain, decreased motor or sensation Integumentary: denies any other rashes or skin discolorations Neurological: denies HA or vision/hearing changes   Vital signs in last 24 hours: [min-max] current  Temp:  [98.6 F (37 C)-101.4 F (38.6 C)] 99.8 F (37.7 C) (06/10 0424) Pulse Rate:  [125-130] 125 (06/10 0424) Resp:  [18-20] 18 (06/10 0424) BP: (116-122)/(57-71) 120/66 (06/10 0424) SpO2:  [97 %-100 %] 97 % (06/10 0424)     Height: 5\' 3"  (160 cm) Weight: 121 lb (54.9 kg) BMI (Calculated): 21.5   Intake/Output this shift:  No intake/output data recorded.   Intake/Output last 2 shifts:  @IOLAST2SHIFTS @   Physical Exam:  Constitutional: alert, cooperative and no distress  HENT: normocephalic without obvious abnormality  Eyes: PERRL, EOM's grossly intact and symmetric  Neuro: CN II - XII grossly intact and symmetric without deficit  Respiratory: breathing non-labored at rest  Cardiovascular: regular rate and sinus rhythm  Gastrointestinal: soft, non-tender, and non-distended, midline VAC dressing well-secured, JP x2 well-secured with small volumes of creamy white  fluid in each bulb and no surrounding erythema Musculoskeletal: UE and LE FROM, no edema or wounds, motor and sensation grossly intact, NT   Labs:  CBC Latest Ref Rng & Units 02/02/2017 02/01/2017 01/31/2017  WBC 3.6 - 11.0 K/uL 19.2(H) 17.1(H) 17.8(H)  Hemoglobin 12.0 - 16.0 g/dL 7.7(L) 7.0(L) 7.4(L)  Hematocrit 35.0 - 47.0 % 23.6(L) 21.4(L) 23.1(L)  Platelets 150 - 440 K/uL 632(H) 559(H) 630(H)   CMP Latest Ref Rng & Units 02/02/2017 01/31/2017 01/30/2017  Glucose 65 - 99 mg/dL 97 80 99  BUN 6 - 20 mg/dL <5(L) 6 5(L)  Creatinine 0.44 - 1.00 mg/dL 0.38(L) 0.43(L) 0.34(L)  Sodium 135 - 145 mmol/L 135 135 134(L)  Potassium 3.5 - 5.1 mmol/L 3.4(L) 4.0 2.7(LL)  Chloride 101 - 111 mmol/L 99(L) 99(L) 97(L)  CO2 22 - 32 mmol/L 30 29 29   Calcium 8.9 - 10.3 mg/dL 8.1(L) 7.9(L) 7.7(L)  Total Protein 6.5 - 8.1 g/dL - - -  Total Bilirubin 0.3 - 1.2 mg/dL - - -  Alkaline Phos 38 - 126 U/L - - -  AST 15 - 41 U/L - - -  ALT 14 - 54 U/L - - -   Microbiology: Blood Cultures (01/31/2017)    bottle 1 of 2: No growth x 2 days (though specimen volume cited as too low for accurate Aerococcus speciation)    bottle 2 of 2: No growth x 2 days  Imaging studies: No new pertinent imaging studies  Assessment/Plan: 51 y.o.femalewith chronic leukocytosis, low-grade fevers, and tachycardia associated with pelvic phlegmon per IR + pelvic abscesses s/p drainage on IV meropenum via PICC and now fluconozole and daptomycin for coag-negative staph cultured from 1 of 2 sets of blood cultures (6/6)  repeated 6/8 to confirm vs contaminant (both sets without growth so far), 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  - immunotherapy, chemotherapy as per oncology - discussed with IR pelvic phlegmon, no fluid collections to be drained -  continue antibiotics per ID, f/u repeat cultures (6/8 + additional blood cultures drawn today) - wound care team changing VAC MWF; please call if any issues changing home wound VAC - anticoagulation ongoing for PE's (no longervisualized on CTA chest on 6/7) - medical management and discharge planning per medical team - no indication for any surgical intervention at this time - will continue to follow, please call with any questions - continue regular diet as tolerated  All of the above findings and recommendations were discussed with the patient, and all of patient's questions were answered to her expressed satisfaction.  Thank you for the opportunity to participate in this patient's care.   -- Marilynne Drivers Rosana Hoes, MD, [place]: [facility] - Partnering for exceptional care. Office: [phone]

## 2017-02-03 ENCOUNTER — Inpatient Hospital Stay
Admit: 2017-02-03 | Discharge: 2017-02-03 | Disposition: A | Discharge status: Home / Self Care | Payer: Managed Care, Other (non HMO) | Attending: Internal Medicine | Admitting: Internal Medicine

## 2017-02-03 DIAGNOSIS — D649 Anemia, unspecified: Secondary | ICD-10-CM

## 2017-02-03 DIAGNOSIS — K449 Diaphragmatic hernia without obstruction or gangrene: Secondary | ICD-10-CM

## 2017-02-03 DIAGNOSIS — C801 Malignant (primary) neoplasm, unspecified: Secondary | ICD-10-CM

## 2017-02-03 DIAGNOSIS — Z79899 Other long term (current) drug therapy: Secondary | ICD-10-CM

## 2017-02-03 DIAGNOSIS — R000 Tachycardia, unspecified: Secondary | ICD-10-CM

## 2017-02-03 DIAGNOSIS — K769 Liver disease, unspecified: Secondary | ICD-10-CM

## 2017-02-03 DIAGNOSIS — C7982 Secondary malignant neoplasm of genital organs: Secondary | ICD-10-CM

## 2017-02-03 DIAGNOSIS — Z86010 Personal history of colonic polyps: Secondary | ICD-10-CM

## 2017-02-03 DIAGNOSIS — D72829 Elevated white blood cell count, unspecified: Secondary | ICD-10-CM

## 2017-02-03 DIAGNOSIS — Z86718 Personal history of other venous thrombosis and embolism: Secondary | ICD-10-CM

## 2017-02-03 DIAGNOSIS — Z86711 Personal history of pulmonary embolism: Secondary | ICD-10-CM

## 2017-02-03 DIAGNOSIS — R918 Other nonspecific abnormal finding of lung field: Secondary | ICD-10-CM

## 2017-02-03 DIAGNOSIS — Z8719 Personal history of other diseases of the digestive system: Secondary | ICD-10-CM

## 2017-02-03 DIAGNOSIS — Z7901 Long term (current) use of anticoagulants: Secondary | ICD-10-CM

## 2017-02-03 DIAGNOSIS — D696 Thrombocytopenia, unspecified: Secondary | ICD-10-CM

## 2017-02-03 DIAGNOSIS — R509 Fever, unspecified: Secondary | ICD-10-CM

## 2017-02-03 DIAGNOSIS — R5383 Other fatigue: Secondary | ICD-10-CM

## 2017-02-03 LAB — CULTURE, BLOOD (ROUTINE X 2)
Culture: NO GROWTH
SPECIAL REQUESTS: ADEQUATE

## 2017-02-03 MED ORDER — MAGNESIUM SULFATE 2 GM/50ML IV SOLN
2.0000 g | Freq: Once | INTRAVENOUS | Status: AC
Start: 2017-02-03 — End: 2017-02-03
  Administered 2017-02-03: 2 g via INTRAVENOUS
  Filled 2017-02-03: qty 50

## 2017-02-03 MED ORDER — DILTIAZEM HCL ER COATED BEADS 120 MG PO CP24
120.0000 mg | ORAL_CAPSULE | Freq: Every day | ORAL | Status: DC
  Administered 2017-02-03 – 2017-02-04 (×2): 120 mg via ORAL
  Filled 2017-02-03 (×2): qty 1

## 2017-02-03 NOTE — Progress Notes (Signed)
While rounding, [place] made initial visit to room 106. Pt was in good spirits but feeling a bit bored because of waiting for results. Pt asked for prayer that her test results would come back with positive news. If not, prayer for guidance in the next steps. Prayer was provided. [place] will follow up on next rounding.    02/03/17 1300  Clinical Encounter Type  Visited With Patient  Visit Type Follow-up  Spiritual Encounters  Spiritual Needs Prayer

## 2017-02-03 NOTE — Progress Notes (Signed)
Pt's HR elevated 130-140's. MD is aware. See new order.

## 2017-02-03 NOTE — Consult Note (Signed)
[place] Nurse wound follow up Reason for Consult: Midline surgical wound. NPWT Medela therapy in place.  Will replace with KCI VAC.    LLQ Colostomy Wound type:Surgical Pressure Injury POA: N/A Measurement:17 cm x 4 cm x 0.3 cm  Wound PET:KKOEC red granulation tissue noted.  Drainage (amount, consistency, odor) Minimal serosanguinous drainage in canister Periwound:Intact. LLQ colostomy Dressing procedure/placement/frequency:Cleanse abdominal wound with NS and pat dry. Fill wound with black foam. Cover with drape. Trim hair as needed. Change Mon/Wed/Fri. Setting 75 mmHg.  [place] Nurse ostomy consultnote Stoma type/location: LLQ colostomy Stomal assessment/size: 1 1/4 " round pink and moist Producing brown soft stool Peristomal assessment: itnact Treatment options for stomal/peristomal skin:none. Barrier perimeter trimmed to accomodate NPWT dressing.  Output softbrown stool Ostomy pouching:2pc. 2 1/4" pouch  Education provided: Patient has been emptying pouch at home. Independent with care.  Enrolled patient in [place] program: Yes previous admission.  [place] team will follow.  Domenic Moras RN BSN [place] Pager [phone]

## 2017-02-03 NOTE — Progress Notes (Signed)
Dr Leslye Peer notified that patient had a 12 beat run of V Tach. MD acknowledged, no new orders.

## 2017-02-03 NOTE — Progress Notes (Signed)
CC: Metastatic cancer Subjective: 51 year old female with known metastatic cancer currently admitted for fever and sepsis. Has improved dramatically since removal of her PICC line. No other active complaints.  Objective: Vital signs in last 24 hours: Temp:  [98.3 F (36.8 C)-101.4 F (38.6 C)] 98.3 F (36.8 C) (06/11 0338) Pulse Rate:  [108-139] 139 (06/11 0338) Resp:  [18-20] 19 (06/11 0338) BP: (102-116)/(61-70) 116/63 (06/11 0338) SpO2:  [97 %-100 %] 100 % (06/11 0338) Last BM Date: 02/02/17 (colostomty)  Intake/Output from previous day: 06/10 0701 - 06/11 0700 In: 450 [P.O.:240; IV Piggyback:200] Out: 3066 [Urine:3050; Drains:16] Intake/Output this shift: Total I/O In: -  Out: 300 [Urine:300]  Physical exam:  Gen.: No acute distress Chest: Clear to auscultation Heart: Tachycardic Abdomen: Soft, nontender, nondistended. Wound VAC in place the midline with good seal. No evidence of peri-incisional infection. 2 drains in place with minimal output within the suction apparatus and minimal output recorded. Ostomy is present, pink, productive of gas and stool.  Lab Results: CBC   Recent Labs  02/01/17 0540 02/02/17 0445  WBC 17.1* 19.2*  HGB 7.0* 7.7*  HCT 21.4* 23.6*  PLT 559* 632*   BMET  Recent Labs  02/02/17 0445  NA 135  K 3.4*  CL 99*  CO2 30  GLUCOSE 97  BUN <5*  CREATININE 0.38*  CALCIUM 8.1*   PT/INR No results for input(s): LABPROT, INR in the last 72 hours. ABG No results for input(s): PHART, HCO3 in the last 72 hours.  Invalid input(s): PCO2, PO2  Studies/Results: No results found.  Anti-infectives: Anti-infectives    Start     Dose/Rate Route Frequency Ordered Stop   02/02/17 1800  DAPTOmycin (CUBICIN) 425 mg in sodium chloride 0.9 % IVPB     425 mg 217 mL/hr over 30 Minutes Intravenous Every 24 hours 02/01/17 2026     02/01/17 1600  DAPTOmycin (CUBICIN) 419 mg in sodium chloride 0.9 % IVPB  Status:  Discontinued     8 mg/kg  52.4  kg (Ideal) 216.8 mL/hr over 30 Minutes Intravenous Every 24 hours 02/01/17 1449 02/01/17 2026   01/31/17 1800  fluconazole (DIFLUCAN) IVPB 400 mg     400 mg 100 mL/hr over 120 Minutes Intravenous Daily-1800 01/30/17 1518     01/30/17 1630  fluconazole (DIFLUCAN) IVPB 800 mg     800 mg 200 mL/hr over 120 Minutes Intravenous  Once 01/30/17 1518 01/30/17 1950   01/30/17 0230  vancomycin (VANCOCIN) IVPB 1000 mg/200 mL premix  Status:  Discontinued     1,000 mg 200 mL/hr over 60 Minutes Intravenous Every 12 hours 01/30/17 0113 01/30/17 1316   01/30/17 0130  meropenem (MERREM) 1 g in sodium chloride 0.9 % 100 mL IVPB     1 g 200 mL/hr over 30 Minutes Intravenous Every 8 hours 01/30/17 0118     01/29/17 1745  vancomycin (VANCOCIN) IVPB 1000 mg/200 mL premix     1,000 mg 200 mL/hr over 60 Minutes Intravenous  Once 01/29/17 1735 01/30/17 0933      Assessment/Plan:  51 year old female with known metastatic cancer and what appears to be line sepsis. Much improved with PICC line removed. Discussed with primary team and with the patient about indications for reinserting a PICC line and restarting prolonged IV antibiotics. Patient voiced understanding. Discussed with patient the possibility of starting to remove her drains. Discussed the output would need to be 0/24 hour period. Possible remove first drain later today. Encourage ambulation and incentive spirometer usage. The  Gen. surgery service will continue to follow with you.  Tyquez Hollibaugh T. Adonis Huguenin, MD, [facility] General Surgeon [facility]  Day [facility] [phone] Night [facility] [phone] 02/03/2017

## 2017-02-03 NOTE — Consult Note (Signed)
[facility]  Date of admission:  01/29/2017  Inpatient day:  02/03/2017  Consulting physician:  Dr.  Loletha Grayer   Reason for Consultation:  Metastatic cancer.  Chief Complaint: Anna [name] is a 51 y.o. female with metastatic adenocarcinoma of unknown primary who was admitted with fever, tachycardia, and leukocytosis.  HPI:  The patient initially presented with bloating and pelvic pain. She was diagnosed with complex mainly solid left adnexal mass by imaging on 11/21/2016: 4.8 x 4.4 cm complex left adnexal mass with vascular and solid components. The EMS was 3.0 mm and two fibroids were noted (43.7 mm and 23.7 mm).  Pre-op CA 125 was normal.   On 12/21/2016, she underwent laparoscopic, extensive pelvic abdominal adhesiolysis, bilateral salpingo-oophorectomy, peritoneal biopsy, cystoscopy and sigmoidoscopy. Findings included: "left adnexa revealed a dilated adnexal mass that was firm and densely adherent to the descending colon deep into the pelvis. There was a 5 cm calcified pedunculated fibroid to the anterior uterus and the right fallopian tube and ovary appeared normal".Given the dense adherence of the descending pelvic colon to the left adnexal mass, Dr Olean Ree was consulted and assisted with the procedure. There was no mention of persistent disease at the termination of the procedure.  Pathology revealed high grade adenocarcinoma involving left tube, ovary, and peritoneal biopsy as well a tiny focus on the right ovary.  A limited panel of immunohistochemical stains was performed and the carcinoma demonstrates the following pattern of immunoreactivity: + CK7, GATA-3, OC125; - CK20, ER, P53, PAX8, CDX-2, WT-1.  Pattern of immunoreactivity did not support a primary ovarian carcinoma and metastatic carcinoma was favored with possible sites of origin pancreas and breast. Cytology was also positive for adenocarcinoma.   The specimen was sent to  [facility] and was reported to be consistent with squamous cell carcinoma with 90% probability and most likely either cervix (69%) or head/neck (21%).   Foundation One testing revealed the following genomic alterations:  BRCA2, NF1, EP300, RB1, TP53.  Microsatellite status was stable.  Tumor mutational burden (TMB) was intermediate (8 Muts/Mb).  Post-operatively, she developed weakness, lethargy, shortness of breath, generalized abdominal pain, diarrhea, and vomiting.  Abdomen and pelvic CT scan on 12/30/2016 revealed perforation of the mid sigmoid colon with a 20-30 cm free fluid and air collection along the right side of the abdomen, scattered pulmonary nodules, 1.4 cm hypodensity left hepatic lobe suspicious for metastatic disease, and retroperitoneal nodules.  There was no definite breast or pancreatic mass.  On 12/31/2016, she underwent exploratory laparotomy, lysis of adhesions, Hartmann's procedure, omentectomy, drainage of multiple interloop abscesses, placement blake drains x 2. Findings included "perforated mid sigmoid colon with purulent peritonitis and obvious contamination of the abdominal cavity; multiple interloop small bowel abscesses and pelvic abscess; necrotic omentum; significant inflammatory response within the small bowel secondary to perforation, and a very hostile abdomen due to significant inflammatory response". 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   Tumor markers for CA15-3, CA19-9, CA 27.29; CEA, AFP, LDH, and CA125 are negative. PD-L1 + 50%.  Intra-abdominal abscess grew E coli, Eikenella, and viridans strep.  CT on 01/05/2017 revealed a 10 x 6 x 5 cm organizing fluid collection in the pelvis suspicious for a developing abscess.  On 01/05/2017, a drain was placed.  Patient was discharged on ciprofloxacin and Flagyl.  She developed increased leukocytosis up to 24,000 and was readmitted to [facility] from 01/21/2017 -  01/24/2017.  CT scan on 01/21/2017 revealed interval increase in  size and number of pulmonary and hepatic metastasis, a new hypodensity associated with the left kidney, and phlegmonous change in the pelvis.  Pigtail catheter was placed.  She was started on cefipime, but developed a rash.  A PICC line was placed and she was discharged on meropenem.  Chest CT angiogram on 01/23/2017 revealed acute pulmonary embolism in the segmental branches of the RUL and RML pulmonary artery.  Bilateral lower extremity duplex on 01/23/2017 revealed occlusive thrombus involving one of the paired left posterior tibial veins.  She was discharged on Eliquis.  She was seen by the home health nurse on 01/29/2017 and noted to have a fever 100.1, tachycardia, and a WBC of 20,000.  She was admitted to the hospital with presumed sepsis.  Abdomen and pelvic CT scan on 01/29/2017 revealed multiple pulmonary and hepatic metastatic lesions. There was a stable complex low density in the left renal cortex concerning for infection, infarction or metastatic disease. There is left periaortic adenopathy and sister with metastatic disease. There was continued inflammatory changes in the pelvis unchanged. Interventional radiology noted no indication for further intervention.  She received a dose of vancomycin and developed red man's syndrome. She was seen by infectious disease who recommended meropenem and additional coverage with fluconazole. Surgery was consulted.  She had persistent fevers.  Blood cultures were positive for coagulase negative staph.  PICC line was pulled on 02/02/2017.  Follow-up blood cultures have been negative.  Current recommendations are for continuation of meropenem and fluconazole until 02/20/2017.  Blood cultures performed on 06/09 are negative on 06/12, a PICC line can be placed for continuation of meropenem for intra-abdominal abscess and daptomycin for presumed PICC line infection.  Echo is  pending.  Symptomatically, she is fatigued.  She denies recent fever or chills. She denies any abdominal pain. She denies any nausea or vomiting.  She is scheduled to see Dr. Grayland Ormond in the oncology clinic on 02/05/2017.  Preauthorization is pending for Keytruda.   Past Medical History:  Diagnosis Date  . Cancer ([place])   . Cancer with unknown primary site [facility])   . Colon polyp   . Diverticulosis   . Fibroid uterus   . Heme positive stool 08/01/2014  . HH (hiatus hernia)   . Large bowel perforation ([place]) 12/31/2016  . Metastatic adenocarcinoma ([place]) 12/20/2016  . Pelvic pain in female 01/13/2017  . Perforation of sigmoid colon [facility])     Past Surgical History:  Procedure Laterality Date  . COLONOSCOPY    . CYSTOSCOPY  12/20/2016   Procedure: CYSTOSCOPY;  Surgeon: Boykin Nearing, MD;  Location: [facility] ORS;  Service: Gynecology;;  . ESOPHAGOGASTRODUODENOSCOPY    . LAPAROSCOPIC BILATERAL SALPINGECTOMY Bilateral 12/20/2016   Procedure: LAPAROSCOPIC BILATERAL SALPINGOOPHERECTOMY,  Excision of anterior 5 cm calcified uterine fibroid;  Surgeon: Boykin Nearing, MD;  Location: [facility] ORS;  Service: Gynecology;  Laterality: Bilateral;  . LAPAROSCOPIC LYSIS OF ADHESIONS  12/20/2016   Procedure: LAPAROSCOPIC LYSIS OF ADHESIONS WITH DISSECTION OF LEFT TUBE AND OVARY.;  Surgeon: Olean Ree, MD;  Location: [facility] ORS;  Service: General;;  . LAPAROTOMY N/A 12/31/2016   Procedure: EXPLORATORY LAPAROTOMY, sigmoid colectomy, removal of omentum, colostomy;  Surgeon: Jules Husbands, MD;  Location: [facility] ORS;  Service: General;  Laterality: N/A;  . SIGMOIDOSCOPY  12/20/2016   Procedure: RIGID SIGMOIDOSCOPY;  Surgeon: Olean Ree, MD;  Location: [facility] ORS;  Service: General;;  . TONSILLECTOMY      Family History  Problem Relation Age of Onset  . Colon  cancer Mother 38  . Diabetes Mother   . Asthma Father   . Breast cancer Neg Hx     Social History:  reports that she has never smoked. She has  never used smokeless tobacco. She reports that she does not drink alcohol or use drugs.  She works in [employer].  She lives in [place].  She has no children.  She is accompanied by her husband.  Allergies:  Allergies  Allergen Reactions  . Cefepime Rash  . Penicillins Rash    As a child. Has patient had a PCN reaction causing immediate rash, facial/tongue/throat swelling, SOB or lightheadedness with hypotension: Unknown Has patient had a PCN reaction causing severe rash involving mucus membranes or skin necrosis: Unknown Has patient had a PCN reaction that required hospitalization: No Has patient had a PCN reaction occurring within the last 10 years: No If all of the above answers are "NO", then may proceed with Cephalosporin use.     Medications Prior to Admission  Medication Sig Dispense Refill  . acetaminophen (TYLENOL) 500 MG tablet Take 500 mg by mouth as needed for mild pain.    Marland Kitchen apixaban (ELIQUIS) 5 MG TABS tablet Take 1 tablet (5 mg total) by mouth 2 (two) times daily. Please take 2 pills orally twice a day for 7 days as a loading dose, then continue 1 pill twice a day indefinitely, thank you (Patient taking differently: Take 5-10 mg by mouth 2 (two) times daily. take 2 pills orally twice a day for 7 days as a loading dose, then continue 1 pill twice a day indefinitely) 70 tablet 5  . diphenhydrAMINE (BENADRYL) 25 mg capsule Take 1 capsule (25 mg total) by mouth every 6 (six) hours as needed (Rash). 60 capsule 0  . meropenem 1 g in sodium chloride 0.9 % 100 mL Inject 1 g into the vein every 8 (eight) hours. 42 Dose 1  . metoprolol succinate (TOPROL-XL) 25 MG 24 hr tablet Take 1 tablet by mouth daily.     Marland Kitchen oxyCODONE-acetaminophen (ROXICET) 5-325 MG tablet Take 1 tablet by mouth every 6 (six) hours as needed. (Patient taking differently: Take 1 tablet by mouth every 6 (six) hours as needed for moderate pain. ) 20 tablet 0    Review of Systems: GENERAL:  Fatigue. Fevers  improved.  No chills.  Weight loss. PERFORMANCE STATUS (ECOG):  2. HEENT:  No visual changes, runny nose, sore throat, mouth sores or tenderness. Lungs: No shortness of breath.  Mild cough.  No hemoptysis. Cardiac:  No chest pain, palpitations, orthopnea, or PND. GI:  No nausea, vomiting, diarrhea, constipation, melena or hematochezia.  Colostomy working well. GU:  No urgency, frequency, dysuria, or hematuria. Musculoskeletal:  No back pain.  No joint pain.  No muscle tenderness. Extremities:  No pain or swelling. Skin:  No rashes or skin changes. Neuro:  No headache, numbness or weakness, balance or coordination issues. Endocrine:  No diabetes, thyroid issues, hot flashes or night sweats. Psych:  No mood changes, depression or anxiety. Pain:  No focal pain. Review of systems:  All other systems reviewed and found to be negative.  Physical Exam:  Blood pressure (!) 92/48, pulse (!) 110, temperature 98.4 F (36.9 C), temperature source Oral, resp. rate 20, height 5' 3"  (1.6 m), weight 121 lb (54.9 kg), last menstrual period 03/01/2015, SpO2 97 %.  GENERAL:  Well developed, well nourished, woman sitting comfortably on the medical unit in no acute distress. MENTAL STATUS:  Alert and oriented  to person, place and time. HEAD:  Short brown hair.  Normocephalic, atraumatic, face symmetric, no Cushingoid features. EYES: Glasses.  Pupils equal round and reactive to light and accomodation.  No conjunctivitis or scleral icterus. ENT:  Oropharynx clear without lesion.  Tongue normal. Mucous membranes moist.  RESPIRATORY:  Clear to auscultation without rales, wheezes or rhonchi. CARDIOVASCULAR:  Regular rate and rhythm without murmur, rub or gallop. ABDOMEN:  Midline wound vac.  2 JP drains in right lower quadrant.  Left lower quadrant ostomy with brown stool.  Soft, non-tender, with active bowel sounds, and no appreciable hepatosplenomegaly.  No masses. SKIN:  No rashes, ulcers or  lesions. EXTREMITIES: No edema, no skin discoloration or tenderness.  No palpable cords. LYMPH NODES: No palpable cervical, supraclavicular, axillary or inguinal adenopathy  NEUROLOGICAL: Unremarkable. PSYCH:  Appropriate.   Results for orders placed or performed during the hospital encounter of 01/29/17 (from the past 48 hour(s))  CBC     Status: Abnormal   Collection Time: 02/02/17  4:45 AM  Result Value Ref Range   WBC 19.2 (H) 3.6 - 11.0 K/uL   RBC 2.69 (L) 3.80 - 5.20 MIL/uL   Hemoglobin 7.7 (L) 12.0 - 16.0 g/dL   HCT 23.6 (L) 35.0 - 47.0 %   MCV 87.9 80.0 - 100.0 fL   MCH 28.5 26.0 - 34.0 pg   MCHC 32.5 32.0 - 36.0 g/dL   RDW 17.0 (H) 11.5 - 14.5 %   Platelets 632 (H) 150 - 440 K/uL  Basic metabolic panel     Status: Abnormal   Collection Time: 02/02/17  4:45 AM  Result Value Ref Range   Sodium 135 135 - 145 mmol/L   Potassium 3.4 (L) 3.5 - 5.1 mmol/L   Chloride 99 (L) 101 - 111 mmol/L   CO2 30 22 - 32 mmol/L   Glucose, Bld 97 65 - 99 mg/dL   BUN <5 (L) 6 - 20 mg/dL   Creatinine, Ser 0.38 (L) 0.44 - 1.00 mg/dL   Calcium 8.1 (L) 8.9 - 10.3 mg/dL   GFR calc non Af Amer >60 >60 mL/min   GFR calc Af Amer >60 >60 mL/min    Comment: (NOTE) The eGFR has been calculated using the CKD EPI equation. This calculation has not been validated in all clinical situations. eGFR's persistently <60 mL/min signify possible Chronic Kidney Disease.    Anion gap 6 5 - 15  Type and screen [facility]     Status: None   Collection Time: 02/02/17  4:45 AM  Result Value Ref Range   ABO/RH(D) A POS    Antibody Screen NEG    Sample Expiration 02/05/2017   CK     Status: Abnormal   Collection Time: 02/02/17  4:45 AM  Result Value Ref Range   Total CK 10 (L) 38 - 234 U/L  CULTURE, BLOOD (ROUTINE X 2) w Reflex to ID Panel     Status: None (Preliminary result)   Collection Time: 02/02/17  5:00 AM  Result Value Ref Range   Specimen Description BLOOD BLOOD LEFT HAND     Special Requests      BOTTLES DRAWN AEROBIC AND ANAEROBIC Blood Culture results may not be optimal due to an inadequate volume of blood received in culture bottles   Culture NO GROWTH 1 DAY    Report Status PENDING   CULTURE, BLOOD (ROUTINE X 2) w Reflex to ID Panel     Status: None (Preliminary result)  Collection Time: 02/02/17  5:15 AM  Result Value Ref Range   Specimen Description BLOOD BLOOD RIGHT ARM    Special Requests      BOTTLES DRAWN AEROBIC AND ANAEROBIC Blood Culture results may not be optimal due to an inadequate volume of blood received in culture bottles   Culture NO GROWTH 1 DAY    Report Status PENDING    No results found.  Assessment:  The patient is a 51 y.o. woman with metastatic adenocarcinoma of unknown primary s/p abdominal surgery complicated by colonic perforation and intra-abdominal abscess, DVT, and pulmonary embolism.  She has pulmonary and hepatic metastasis.  She has a wound vac and drains in place.  She was admitted with fever, leukocytosis, and tachycardia.  CT scans were stable.  Blood cultures grew coagulase negative staph.  PICC line was pulled on 02/02/2017.  Fevers have improved.    Pathology from her surgery on 12/21/2016 revealed high grade adenocarcinoma involving left tube, ovary, and peritoneal biopsy as well a tiny focus on the right ovary.  Immunohistochemical stains demonstrated the following pattern of immunoreactivity: + CK7, GATA-3, OC125; - CK20, ER, P53, PAX8, CDX-2, WT-1.  Pattern of immunoreactivity did not support a primary ovarian carcinoma.  Metastatic carcinoma was favored with possible sites of origin pancreas and breast. Tumor markers for CA15-3, CA19-9, CA 27.29; CEA, AFP, LDH, and CA125 were negative.  [facility] Testing was felt consistent with squamous cell carcinoma with 90% probability and most likely either cervix (69%) or head/neck (21%).   Foundation One testing revealed the following genomic alterations:  BRCA2, NF1,  EP300, RB1, TP53.  Microsatellite status was stable.  Tumor mutational burden (TMB) was intermediate (8 Muts/Mb).  PD-L1 + was 50%.  Symptomatically, she is feeling better.  She denies any abdominal pain.  Fever has improved.  Plan:   1.  Oncology:  Patient with metastatic adenocarcinoma of unknown primary.  Patient currently not a candidate for chemotherapy.  As her tumor is PDL-1 50%, she is a candidate for immunotherapy Beryle Flock).  Preauthorization is in process.  Given Foundation One testing, olaparib may be another option in the future.  She is scheduled to see Dr. Grayland Ormond in clinic on 02/05/2017.  2.  Hematology:  Patient on Eliquis for a history of DVT and pulmonary embolism.  She has anemia likely secondary to chronic disease.  Additional labs to be sent.  Thrombocytosis is an acute phase reactant secondary to her infection and anemia.  3.  Infectious disease:  Patient currently on meropenem, fluconazole, and daptomycin.  PICC line out. Possible replacement of PICC line on 02/04/2017 if cultures remain negative for continuation of antibiotics through 02/20/2017.   Echo pending.  Appreciate ID consult.    4.  Disposition:  Per patient, possible discharge in next 1-2 days.  Thank you for allowing me to participate in Anna [name] 's care.  I will follow her closely with you while hospitalized.  She is scheduled to see Dr. Grayland Ormond in the outpatient department.    Lequita Asal, MD  02/03/2017, 6:50 PM

## 2017-02-03 NOTE — Progress Notes (Signed)
Notified  Dr Eugenia Mcalpine that patient had blood pressure of 92/48 heart rate 110. MD order to go ahead with 1:00 dose of  Metoprolol.

## 2017-02-03 NOTE — Progress Notes (Signed)
[facility] INFECTIOUS DISEASE PROGRESS NOTE Date of Admission:  01/29/2017     ID: Anna [name] is a 51 y.o. female with intraabdominal abscess Active Problems:   Sepsis ([place])   Subjective: Had fever over weekend and second bcx + for aerococcus so picc removed 6/9 and started daptomycin. Feels better, no fevers.   ROS  Eleven systems are reviewed and negative except per hpi  Medications:  Antibiotics Given (last 72 hours)    Date/Time Action Medication Dose Rate   01/31/17 2107 New Bag/Given   meropenem (MERREM) 1 g in sodium chloride 0.9 % 100 mL IVPB 1 g 200 mL/hr   02/01/17 0329 New Bag/Given   meropenem (MERREM) 1 g in sodium chloride 0.9 % 100 mL IVPB 1 g 200 mL/hr   02/01/17 1257 New Bag/Given   meropenem (MERREM) 1 g in sodium chloride 0.9 % 100 mL IVPB 1 g 200 mL/hr   02/01/17 1617 New Bag/Given   DAPTOmycin (CUBICIN) 419 mg in sodium chloride 0.9 % IVPB 419 mg 216.8 mL/hr   02/01/17 2056 New Bag/Given   meropenem (MERREM) 1 g in sodium chloride 0.9 % 100 mL IVPB 1 g 200 mL/hr   02/02/17 0320 New Bag/Given   meropenem (MERREM) 1 g in sodium chloride 0.9 % 100 mL IVPB 1 g 200 mL/hr   02/02/17 1308 New Bag/Given   meropenem (MERREM) 1 g in sodium chloride 0.9 % 100 mL IVPB 1 g 200 mL/hr   02/02/17 2045 New Bag/Given  [pt preference]   DAPTOmycin (CUBICIN) 425 mg in sodium chloride 0.9 % IVPB 425 mg 217 mL/hr   02/02/17 2118 New Bag/Given  [pt preference]   meropenem (MERREM) 1 g in sodium chloride 0.9 % 100 mL IVPB 1 g 200 mL/hr   02/03/17 0341 New Bag/Given   meropenem (MERREM) 1 g in sodium chloride 0.9 % 100 mL IVPB 1 g 200 mL/hr   02/03/17 1343 New Bag/Given   meropenem (MERREM) 1 g in sodium chloride 0.9 % 100 mL IVPB 1 g 200 mL/hr     . apixaban  5 mg Oral BID  . diltiazem  120 mg Oral Daily  . metoprolol tartrate  25 mg Oral TID PC    Objective: Vital signs in last 24 hours: Temp:  [98.3 F (36.8 C)-98.9 F (37.2 C)] 98.4 F (36.9 C) (06/11  1232) Pulse Rate:  [108-139] 110 (06/11 1232) Resp:  [19-20] 20 (06/11 1232) BP: (92-116)/(48-64) 92/48 (06/11 1232) SpO2:  [97 %-100 %] 97 % (06/11 1232) Constitutional:  oriented to person, place, and time. appears well-developed and well-nourished. No distress.  HENT: [place]/AT, PERRLA, no scleral icterus Mouth/Throat: Oropharynx is clear and moist. No oropharyngeal exudate.  Cardiovascular: tachy, regular Pulmonary/Chest: Effort normal and breath sounds normal. No respiratory distress.  has no wheezes.  Neck = supple, no nuchal rigidity Abdominal: Soft. Wound vac over ant abd incision site. 2 JP drains in RLQ and pelvic region with thin serous drainage Lymphadenopathy: no cervical adenopathy. No axillary adenopathy Neurological: alert and oriented to person, place, and time.  Skin: Skin is warm and dry. No rash noted. No erythema.  Psychiatric: a normal mood and affect.  behavior is normal.   Lab Results  Recent Labs  02/01/17 0540 02/02/17 0445  WBC 17.1* 19.2*  HGB 7.0* 7.7*  HCT 21.4* 23.6*  NA  --  135  K  --  3.4*  CL  --  99*  CO2  --  30  BUN  --  <  5*  CREATININE  --  0.38*    Microbiology: Results for orders placed or performed during the hospital encounter of 01/29/17  Culture, blood (Routine x 2)     Status: None   Collection Time: 01/29/17  5:14 PM  Result Value Ref Range Status   Specimen Description BLOOD RAC  Final   Special Requests   Final    BOTTLES DRAWN AEROBIC AND ANAEROBIC Blood Culture adequate volume   Culture NO GROWTH 5 DAYS  Final   Report Status 02/03/2017 FINAL  Final  Culture, blood (Routine x 2)     Status: Abnormal   Collection Time: 01/29/17  5:14 PM  Result Value Ref Range Status   Specimen Description BLOOD LARM  Final   Special Requests   Final    BOTTLES DRAWN AEROBIC AND ANAEROBIC Blood Culture adequate volume   Culture  Setup Time   Final    GRAM POSITIVE COCCI AEROBIC BOTTLE ONLY CRITICAL RESULT CALLED TO, READ BACK BY AND  VERIFIED WITH: KAREN HAYES 01/31/17 0909 SGD    Culture (A)  Final    STAPHYLOCOCCUS SPECIES (COAGULASE NEGATIVE) THE SIGNIFICANCE OF ISOLATING THIS ORGANISM FROM A SINGLE SET OF BLOOD CULTURES WHEN MULTIPLE SETS ARE DRAWN IS UNCERTAIN. PLEASE NOTIFY THE MICROBIOLOGY DEPARTMENT WITHIN ONE WEEK IF SPECIATION AND SENSITIVITIES ARE REQUIRED. Performed at [facility], [place] [address] [place], [place] [address]    Report Status 02/01/2017 FINAL  Final  Blood Culture ID Panel (Reflexed)     Status: Abnormal   Collection Time: 01/29/17  5:14 PM  Result Value Ref Range Status   Enterococcus species NOT DETECTED NOT DETECTED Final   Listeria monocytogenes NOT DETECTED NOT DETECTED Final   Staphylococcus species DETECTED (A) NOT DETECTED Final    Comment: Methicillin (oxacillin) resistant coagulase negative staphylococcus. Possible blood culture contaminant (unless isolated from more than one blood culture draw or clinical case suggests pathogenicity). No antibiotic treatment is indicated for blood  culture contaminants. CRITICAL RESULT CALLED TO, READ BACK BY AND VERIFIED WITH: KAREN HAYES 01/31/17 0909 SGD    Staphylococcus aureus NOT DETECTED NOT DETECTED Final   Methicillin resistance DETECTED (A) NOT DETECTED Final    Comment: CRITICAL RESULT CALLED TO, READ BACK BY AND VERIFIED WITH: KAREN HAYES 01/31/17 0909 SGD    Streptococcus species NOT DETECTED NOT DETECTED Final   Streptococcus agalactiae NOT DETECTED NOT DETECTED Final   Streptococcus pneumoniae NOT DETECTED NOT DETECTED Final   Streptococcus pyogenes NOT DETECTED NOT DETECTED Final   Acinetobacter baumannii NOT DETECTED NOT DETECTED Final   Enterobacteriaceae species NOT DETECTED NOT DETECTED Final   Enterobacter cloacae complex NOT DETECTED NOT DETECTED Final   Escherichia coli NOT DETECTED NOT DETECTED Final   Klebsiella oxytoca NOT DETECTED NOT DETECTED Final   Klebsiella pneumoniae NOT DETECTED NOT DETECTED Final   Proteus  species NOT DETECTED NOT DETECTED Final   Serratia marcescens NOT DETECTED NOT DETECTED Final   Haemophilus influenzae NOT DETECTED NOT DETECTED Final   Neisseria meningitidis NOT DETECTED NOT DETECTED Final   Pseudomonas aeruginosa NOT DETECTED NOT DETECTED Final   Candida albicans NOT DETECTED NOT DETECTED Final   Candida glabrata NOT DETECTED NOT DETECTED Final   Candida krusei NOT DETECTED NOT DETECTED Final   Candida parapsilosis NOT DETECTED NOT DETECTED Final   Candida tropicalis NOT DETECTED NOT DETECTED Final  MRSA PCR Screening     Status: None   Collection Time: 01/30/17  9:27 AM  Result Value Ref Range  Status   MRSA by PCR NEGATIVE NEGATIVE Final    Comment:        The GeneXpert MRSA Assay (FDA approved for NASAL specimens only), is one component of a comprehensive MRSA colonization surveillance program. It is not intended to diagnose MRSA infection nor to guide or monitor treatment for MRSA infections.   Culture, blood (Routine X 2) w Reflex to ID Panel     Status: Abnormal (Preliminary result)   Collection Time: 01/31/17  1:05 PM  Result Value Ref Range Status   Specimen Description BLOOD RIGHT HAND  Final   Special Requests (A)  Final    AEROCOCCUS SPECIES Blood Culture results may not be optimal due to an inadequate volume of blood received in culture bottles   Culture NO GROWTH 3 DAYS  Final   Report Status PENDING  Incomplete  Culture, blood (Routine X 2) w Reflex to ID Panel     Status: None (Preliminary result)   Collection Time: 01/31/17  2:25 PM  Result Value Ref Range Status   Specimen Description BLOOD RIGHT ARM  Final   Special Requests   Final    BOTTLES DRAWN AEROBIC AND ANAEROBIC Blood Culture adequate volume   Culture NO GROWTH 3 DAYS  Final   Report Status PENDING  Incomplete  CULTURE, BLOOD (ROUTINE X 2) w Reflex to ID Panel     Status: None (Preliminary result)   Collection Time: 02/02/17  5:00 AM  Result Value Ref Range Status   Specimen  Description BLOOD BLOOD LEFT HAND  Final   Special Requests   Final    BOTTLES DRAWN AEROBIC AND ANAEROBIC Blood Culture results may not be optimal due to an inadequate volume of blood received in culture bottles   Culture NO GROWTH 1 DAY  Final   Report Status PENDING  Incomplete  CULTURE, BLOOD (ROUTINE X 2) w Reflex to ID Panel     Status: None (Preliminary result)   Collection Time: 02/02/17  5:15 AM  Result Value Ref Range Status   Specimen Description BLOOD BLOOD RIGHT ARM  Final   Special Requests   Final    BOTTLES DRAWN AEROBIC AND ANAEROBIC Blood Culture results may not be optimal due to an inadequate volume of blood received in culture bottles   Culture NO GROWTH 1 DAY  Final   Report Status PENDING  Incomplete    Studies/Results: No results found.  Assessment/Plan: Anna [name] is a 51 y.o. female with recent dx of metastatic adenocarcinoma of unknown primary , s.p colonic perforation and Hartmanns pouch procedure May 8 complicated by intra-abdominal abscess. May 8 cx grew E coli, Eikenella, viridans strep.  May 13 had placement of drain with findings of 40 ML fluid and debris - cx negative.  Dced initially on cipro and flagyl but had worsening wbc up to 24 K so readmitted 5/29-6/1. CT done showed worsening metastatic disease, phlegmon in pelvis with presummed abscess collections with pigtail cath in place. Started on cefepime but developed a rash but was able to tolerate meropenem. We placed picc and she was dced on IV meropenem June 6 - readmitted with fevers and peristently elevated WBC. Otpt home health labs showed wbc 20 and plts elevated in 700s.   CT has been done this admission and still shows pelvic inflamm changes and multiple small complex fluid collections unchanged. Reviewed with IR and no real intervention possible.  She currently denies abd pain, fevers improving. PICC working well Received vanco and had  rash and redmans. She had no rash at home with them  meropenem.  I still  think her persistent leukocytosis, thrombocytosis, tachycardia and fevers are from the ongoing abscess collection in pelvic area but no further drainage possible.   6/11 - given fevers over weekend picc removed. Fu bcx neg.  Recommendations Continue meropenem until 6/28 Continue  antifungal coverage with fluconazole until 6/28 If fu bcx done 6/9 are negative tomorrow can place picc and c on meropenem (for intraabdominal abscess) and daptomycin (for presumed picc line infection)Thank you very much for the consult. Will follow with you.  Anna [name] P   02/03/2017, 5:04 PM

## 2017-02-03 NOTE — Progress Notes (Signed)
ANTICOAGULATION CONSULT NOTE - FOLLOW UP Consult  Pharmacy Consult for apixaban Indication: pulmonary embolus  Allergies  Allergen Reactions  . Cefepime Rash  . Penicillins Rash    As a child. Has patient had a PCN reaction causing immediate rash, facial/tongue/throat swelling, SOB or lightheadedness with hypotension: Unknown Has patient had a PCN reaction causing severe rash involving mucus membranes or skin necrosis: Unknown Has patient had a PCN reaction that required hospitalization: No Has patient had a PCN reaction occurring within the last 10 years: No If all of the above answers are "NO", then may proceed with Cephalosporin use.     Patient Measurements: Height: 5\' 3"  (160 cm) Weight: 121 lb (54.9 kg) IBW/kg (Calculated) : 52.4 Heparin Dosing Weight: 54.9 kg  Vital Signs: Temp: 98.3 F (36.8 C) (06/11 0338) Temp Source: Oral (06/11 0338) BP: 116/63 (06/11 0338) Pulse Rate: 139 (06/11 0338)  Labs:  Recent Labs  02/01/17 0540 02/01/17 1537 02/02/17 0445  HGB 7.0*  --  7.7*  HCT 21.4*  --  23.6*  PLT 559*  --  632*  CREATININE  --   --  0.38*  CKTOTAL  --  10* 10*    Estimated Creatinine Clearance: 68 mL/min (A) (by C-G formula based on SCr of 0.38 mg/dL (L)).   Medical History: Past Medical History:  Diagnosis Date  . Cancer ([place])   . Cancer with unknown primary site [facility])   . Colon polyp   . Diverticulosis   . Fibroid uterus   . Heme positive stool 08/01/2014  . HH (hiatus hernia)   . Large bowel perforation ([place]) 12/31/2016  . Metastatic adenocarcinoma ([place]) 12/20/2016  . Pelvic pain in female 01/13/2017  . Perforation of sigmoid colon ([facility])     Medications:  Scheduled:  . apixaban  5 mg Oral BID  . diltiazem  120 mg Oral Daily  . metoprolol tartrate  25 mg Oral TID PC    Assessment: Patient admitted for fever and has recent h/o PE is being anticoagulated with apixaban. Apixaban started on 6/1: Apixaban 10 mg twice daily for 7 days load  (last day of load 6/7). Apixaban 5 mg twice daily indefinitely starting 6/8.  Goal of Therapy:  Monitor platelets by anticoagulation protocol: Yes   Plan:  Continue apixaban 5 mg PO BID.   Thank you for this consult.  Larene Beach, PharmD  Clinical Pharmacist 02/03/2017

## 2017-02-03 NOTE — Progress Notes (Signed)
Pharmacy Antibiotic Note  Anna [name] is a 51 y.o. female admitted on 01/29/2017 with sepsis due to intraabdominal infection.  Pharmacy has been consulted for meropenem and fluconazole dosing.  Plan: Continue fluconazole  400 mg IV q24hrs through 6/28.  Will continue meropenem 1g IV q8h through 6/28 Daptomycin 425 mg IV q24 hours.  Height: 5\' 3"  (160 cm) Weight: 121 lb (54.9 kg) IBW/kg (Calculated) : 52.4  Temp (24hrs), Avg:99.4 F (37.4 C), Min:98.3 F (36.8 C), Max:101.4 F (38.6 C)   Recent Labs Lab 01/29/17 0058 01/29/17 1714 01/30/17 0100 01/31/17 0536 02/01/17 0540 02/02/17 0445  WBC  --  19.4* 18.0* 17.8* 17.1* 19.2*  CREATININE  --  0.38* 0.34* 0.43*  --  0.38*  LATICACIDVEN 0.8 1.0  --   --   --   --     Estimated Creatinine Clearance: 68 mL/min (A) (by C-G formula based on SCr of 0.38 mg/dL (L)).    Allergies  Allergen Reactions  . Cefepime Rash  . Penicillins Rash    As a child. Has patient had a PCN reaction causing immediate rash, facial/tongue/throat swelling, SOB or lightheadedness with hypotension: Unknown Has patient had a PCN reaction causing severe rash involving mucus membranes or skin necrosis: Unknown Has patient had a PCN reaction that required hospitalization: No Has patient had a PCN reaction occurring within the last 10 years: No If all of the above answers are "NO", then may proceed with Cephalosporin use.      Thank you for allowing pharmacy to be a part of this patient's care.  Larene Beach, PharmD  02/03/2017

## 2017-02-03 NOTE — Progress Notes (Signed)
Patient ID: Anna [name], female   DOB: June 26, 1966, 51 y.o.   MRN: [id number]   [facility] PROGRESS NOTE  Anna [name] DOB: [birth year] DOA: 01/29/2017 PCP: McLean-Scocuzza, Nino Glow, MD  HPI/Subjective: Patient has some abdominal pain when palpating. Otherwise she feels okay. She doesn't feel any palpitations. Afebrile at this point.  Objective: Vitals:   02/03/17 0338 02/03/17 1232  BP: 116/63 (!) 92/48  Pulse: (!) 139 (!) 110  Resp: 19 20  Temp: 98.3 F (36.8 C) 98.4 F (36.9 C)    Filed Weights   01/29/17 1703  Weight: 54.9 kg (121 lb)    ROS: Review of Systems  Constitutional: Negative for chills and fever.  Eyes: Negative for blurred vision.  Respiratory: Negative for cough and shortness of breath.   Cardiovascular: Negative for chest pain.  Gastrointestinal: Positive for abdominal pain. Negative for constipation, diarrhea, nausea and vomiting.  Genitourinary: Negative for dysuria.  Musculoskeletal: Negative for joint pain.  Neurological: Negative for dizziness and headaches.   Exam: Physical Exam  Constitutional: She is oriented to person, place, and time.  HENT:  Nose: No mucosal edema.  Mouth/Throat: No oropharyngeal exudate or posterior oropharyngeal edema.  Eyes: EOM and lids are normal. Pupils are equal, round, and reactive to light.  Conjunctiva pale  Neck: No JVD present. Carotid bruit is not present. No edema present. No thyroid mass and no thyromegaly present.  Cardiovascular: S1 normal, S2 normal and normal heart sounds.  Tachycardia present.  Exam reveals no gallop.   No murmur heard. Pulses:      Dorsalis pedis pulses are 2+ on the right side, and 2+ on the left side.  Respiratory: No respiratory distress. She has no wheezes. She has no rhonchi. She has no rales.  GI: Soft. Bowel sounds are normal. There is no tenderness.  Musculoskeletal:       Right ankle: She exhibits no swelling.       Left ankle: She exhibits  no swelling.  Lymphadenopathy:    She has no cervical adenopathy.  Neurological: She is alert and oriented to person, place, and time. No cranial nerve deficit.  Skin: Skin is warm. No rash noted. Nails show no clubbing.  Psychiatric: She has a normal mood and affect.      Data Reviewed: Basic Metabolic Panel:  Recent Labs Lab 01/29/17 1714 01/30/17 0100 01/31/17 0536 02/02/17 0445  NA 132* 134* 135 135  K 3.1* 2.7* 4.0 3.4*  CL 93* 97* 99* 99*  CO2 30 29 29 30   GLUCOSE 96 99 80 97  BUN 11 5* 6 <5*  CREATININE 0.38* 0.34* 0.43* 0.38*  CALCIUM 8.0* 7.7* 7.9* 8.1*  MG  --  1.8  --   --   PHOS  --  2.0*  --   --    Liver Function Tests:  Recent Labs Lab 01/29/17 1714  AST 78*  ALT 62*  ALKPHOS 90  BILITOT 0.7  PROT 6.1*  ALBUMIN 2.5*   CBC:  Recent Labs Lab 01/29/17 1714 01/30/17 0100 01/31/17 0536 02/01/17 0540 02/02/17 0445  WBC 19.4* 18.0* 17.8* 17.1* 19.2*  NEUTROABS 17.3*  --   --   --   --   HGB 8.7* 7.5* 7.4* 7.0* 7.7*  HCT 26.7* 22.9* 23.1* 21.4* 23.6*  MCV 87.8 88.0 88.7 88.9 87.9  PLT 627* 630* 630* 559* 632*     Recent Results (from the past 240 hour(s))  Culture, blood (Routine x 2)  Status: None   Collection Time: 01/29/17  5:14 PM  Result Value Ref Range Status   Specimen Description BLOOD RAC  Final   Special Requests   Final    BOTTLES DRAWN AEROBIC AND ANAEROBIC Blood Culture adequate volume   Culture NO GROWTH 5 DAYS  Final   Report Status 02/03/2017 FINAL  Final  Culture, blood (Routine x 2)     Status: Abnormal   Collection Time: 01/29/17  5:14 PM  Result Value Ref Range Status   Specimen Description BLOOD LARM  Final   Special Requests   Final    BOTTLES DRAWN AEROBIC AND ANAEROBIC Blood Culture adequate volume   Culture  Setup Time   Final    GRAM POSITIVE COCCI AEROBIC BOTTLE ONLY CRITICAL RESULT CALLED TO, READ BACK BY AND VERIFIED WITH: KAREN HAYES 01/31/17 0909 SGD    Culture (A)  Final    STAPHYLOCOCCUS SPECIES  (COAGULASE NEGATIVE) THE SIGNIFICANCE OF ISOLATING THIS ORGANISM FROM A SINGLE SET OF BLOOD CULTURES WHEN MULTIPLE SETS ARE DRAWN IS UNCERTAIN. PLEASE NOTIFY THE MICROBIOLOGY DEPARTMENT WITHIN ONE WEEK IF SPECIATION AND SENSITIVITIES ARE REQUIRED. Performed at [facility], [place] [address] [place], [place] [address]    Report Status 02/01/2017 FINAL  Final  Blood Culture ID Panel (Reflexed)     Status: Abnormal   Collection Time: 01/29/17  5:14 PM  Result Value Ref Range Status   Enterococcus species NOT DETECTED NOT DETECTED Final   Listeria monocytogenes NOT DETECTED NOT DETECTED Final   Staphylococcus species DETECTED (A) NOT DETECTED Final    Comment: Methicillin (oxacillin) resistant coagulase negative staphylococcus. Possible blood culture contaminant (unless isolated from more than one blood culture draw or clinical case suggests pathogenicity). No antibiotic treatment is indicated for blood  culture contaminants. CRITICAL RESULT CALLED TO, READ BACK BY AND VERIFIED WITH: KAREN HAYES 01/31/17 0909 SGD    Staphylococcus aureus NOT DETECTED NOT DETECTED Final   Methicillin resistance DETECTED (A) NOT DETECTED Final    Comment: CRITICAL RESULT CALLED TO, READ BACK BY AND VERIFIED WITH: KAREN HAYES 01/31/17 0909 SGD    Streptococcus species NOT DETECTED NOT DETECTED Final   Streptococcus agalactiae NOT DETECTED NOT DETECTED Final   Streptococcus pneumoniae NOT DETECTED NOT DETECTED Final   Streptococcus pyogenes NOT DETECTED NOT DETECTED Final   Acinetobacter baumannii NOT DETECTED NOT DETECTED Final   Enterobacteriaceae species NOT DETECTED NOT DETECTED Final   Enterobacter cloacae complex NOT DETECTED NOT DETECTED Final   Escherichia coli NOT DETECTED NOT DETECTED Final   Klebsiella oxytoca NOT DETECTED NOT DETECTED Final   Klebsiella pneumoniae NOT DETECTED NOT DETECTED Final   Proteus species NOT DETECTED NOT DETECTED Final   Serratia marcescens NOT DETECTED NOT DETECTED  Final   Haemophilus influenzae NOT DETECTED NOT DETECTED Final   Neisseria meningitidis NOT DETECTED NOT DETECTED Final   Pseudomonas aeruginosa NOT DETECTED NOT DETECTED Final   Candida albicans NOT DETECTED NOT DETECTED Final   Candida glabrata NOT DETECTED NOT DETECTED Final   Candida krusei NOT DETECTED NOT DETECTED Final   Candida parapsilosis NOT DETECTED NOT DETECTED Final   Candida tropicalis NOT DETECTED NOT DETECTED Final  MRSA PCR Screening     Status: None   Collection Time: 01/30/17  9:27 AM  Result Value Ref Range Status   MRSA by PCR NEGATIVE NEGATIVE Final    Comment:        The GeneXpert MRSA Assay (FDA approved for NASAL specimens only), is one component  of a comprehensive MRSA colonization surveillance program. It is not intended to diagnose MRSA infection nor to guide or monitor treatment for MRSA infections.   Culture, blood (Routine X 2) w Reflex to ID Panel     Status: Abnormal (Preliminary result)   Collection Time: 01/31/17  1:05 PM  Result Value Ref Range Status   Specimen Description BLOOD RIGHT HAND  Final   Special Requests (A)  Final    AEROCOCCUS SPECIES Blood Culture results may not be optimal due to an inadequate volume of blood received in culture bottles   Culture NO GROWTH 3 DAYS  Final   Report Status PENDING  Incomplete  Culture, blood (Routine X 2) w Reflex to ID Panel     Status: None (Preliminary result)   Collection Time: 01/31/17  2:25 PM  Result Value Ref Range Status   Specimen Description BLOOD RIGHT ARM  Final   Special Requests   Final    BOTTLES DRAWN AEROBIC AND ANAEROBIC Blood Culture adequate volume   Culture NO GROWTH 3 DAYS  Final   Report Status PENDING  Incomplete  CULTURE, BLOOD (ROUTINE X 2) w Reflex to ID Panel     Status: None (Preliminary result)   Collection Time: 02/02/17  5:00 AM  Result Value Ref Range Status   Specimen Description BLOOD BLOOD LEFT HAND  Final   Special Requests   Final    BOTTLES DRAWN  AEROBIC AND ANAEROBIC Blood Culture results may not be optimal due to an inadequate volume of blood received in culture bottles   Culture NO GROWTH 1 DAY  Final   Report Status PENDING  Incomplete  CULTURE, BLOOD (ROUTINE X 2) w Reflex to ID Panel     Status: None (Preliminary result)   Collection Time: 02/02/17  5:15 AM  Result Value Ref Range Status   Specimen Description BLOOD BLOOD RIGHT ARM  Final   Special Requests   Final    BOTTLES DRAWN AEROBIC AND ANAEROBIC Blood Culture results may not be optimal due to an inadequate volume of blood received in culture bottles   Culture NO GROWTH 1 DAY  Final   Report Status PENDING  Incomplete      Scheduled Meds: . apixaban  5 mg Oral BID  . diltiazem  120 mg Oral Daily  . metoprolol tartrate  25 mg Oral TID PC   Continuous Infusions: . DAPTOmycin (CUBICIN)  IV Stopped (02/02/17 2115)  . fluconazole (DIFLUCAN) IV Stopped (02/02/17 2036)  . meropenem (MERREM) IV 1 g (02/03/17 1343)    Assessment/Plan:  1. Sepsis. Repeat blood cultures with Aerococcus. This Is a PICC line infection.  PICC line Removed yesterday. Repeat blood cultures drawn yesterday are negative so far. Patient still has percutaneous drainage tubes in her abscess in the abdomen. Continue daptomycin, fluconazole and meropenem. Once blood cultures are negative for 48 hours can replace PICC line. 2. Metastatic carcinomatosis of unknown primary. Patient has extensive metastatic disease. 3. History of DVT on Eliquis 4. Essential hypertension and tachycardia. Continue metoprolol 25 mg 3 times a day, added Cardizem CD 5. Nonsustained ventricular tachycardia. Replace magnesium IV. Already on metoprolol. Check an echocardiogram 6. Hypokalemia oral replacement today 7. Anemia of chronic disease. Hemoglobin up to 7.7 after stopping fluids yesterday. Recheck hemoglobin tomorrow  Code Status:     Code Status Orders        Start     Ordered   01/30/17 0009  Full code   Continuous  01/30/17 0009    Code Status History    Date Active Date Inactive Code Status Order ID Comments User Context   01/21/2017  9:00 PM 01/24/2017  5:38 PM Full Code [id number]  Fritzi Mandes, MD Inpatient   12/31/2016  3:58 AM 01/10/2017  4:29 PM Full Code [id number]  Jules Husbands, MD Inpatient   12/30/2016 10:48 PM 12/31/2016  3:58 AM Full Code [id number]  Schermerhorn, Gwen Her, MD ED   12/20/2016 11:16 AM 12/21/2016  1:51 PM Full Code [id number]  Schermerhorn, Gwen Her, MD Inpatient     Disposition Plan: Will need a repeat PICC line once blood cultures are negative for 48 hours  Consultants:  Surgery  Infectious disease  Antibiotics:  Daptomycin  Meropenem  Fluconazole  Time spent: 24 minutes  [place], [place]

## 2017-02-04 DIAGNOSIS — N739 Female pelvic inflammatory disease, unspecified: Secondary | ICD-10-CM

## 2017-02-04 LAB — ECHOCARDIOGRAM COMPLETE
HEIGHTINCHES: 63 in
Weight: 1936 oz

## 2017-02-04 LAB — BASIC METABOLIC PANEL
Anion gap: 10 (ref 5–15)
BUN: <5 mg/dL — ABNORMAL LOW (ref 6–20)
CALCIUM: 8 mg/dL — AB (ref 8.9–10.3)
CO2: 26 mmol/L (ref 22–32)
Chloride: 97 mmol/L — ABNORMAL LOW (ref 101–111)
Glucose, Bld: 89 mg/dL (ref 65–99)
Potassium: 3.8 mmol/L (ref 3.5–5.1)
SODIUM: 133 mmol/L — AB (ref 135–145)

## 2017-02-04 LAB — MAGNESIUM: Magnesium: 2.2 mg/dL (ref 1.7–2.4)

## 2017-02-04 LAB — CBC
HEMATOCRIT: 22.6 % — AB (ref 35.0–47.0)
Hemoglobin: 7.3 g/dL — ABNORMAL LOW (ref 12.0–16.0)
MCH: 27.8 pg (ref 26.0–34.0)
MCHC: 32.2 g/dL (ref 32.0–36.0)
MCV: 86.4 fL (ref 80.0–100.0)
Platelets: 789 10*3/uL — ABNORMAL HIGH (ref 150–440)
RBC: 2.61 MIL/uL — ABNORMAL LOW (ref 3.80–5.20)
RDW: 16.9 % — AB (ref 11.5–14.5)
WBC: 24.5 10*3/uL — ABNORMAL HIGH (ref 3.6–11.0)

## 2017-02-04 MED ORDER — FERROUS SULFATE 325 (65 FE) MG PO TABS: 325.0000 mg | ORAL_TABLET | Freq: Every day | ORAL | Status: DC

## 2017-02-04 MED ORDER — FERROUS SULFATE 325 (65 FE) MG PO TABS: 325.0000 mg | ORAL_TABLET | Freq: Every day | ORAL | 0 refills | Status: AC

## 2017-02-04 MED ORDER — ONDANSETRON HCL 4 MG PO TABS: 4.0000 mg | ORAL_TABLET | Freq: Four times a day (QID) | ORAL | 0 refills | Status: DC | PRN

## 2017-02-04 MED ORDER — POTASSIUM CHLORIDE CRYS ER 20 MEQ PO TBCR
40.0000 meq | EXTENDED_RELEASE_TABLET | Freq: Once | ORAL | Status: AC
  Administered 2017-02-04: 10:00:00 40 meq via ORAL
  Filled 2017-02-04: qty 2

## 2017-02-04 MED ORDER — FLUCONAZOLE 200 MG PO TABS: 200.0000 mg | ORAL_TABLET | Freq: Every day | ORAL | 0 refills | Status: AC

## 2017-02-04 MED ORDER — APIXABAN 5 MG PO TABS: 5.0000 mg | ORAL_TABLET | Freq: Two times a day (BID) | ORAL | 0 refills | Status: AC

## 2017-02-04 MED ORDER — DILTIAZEM HCL ER COATED BEADS 240 MG PO CP24: 240.0000 mg | ORAL_CAPSULE | Freq: Every day | ORAL | 0 refills | Status: DC

## 2017-02-04 MED ORDER — OXYCODONE-ACETAMINOPHEN 5-325 MG PO TABS: 1.0000 | ORAL_TABLET | Freq: Four times a day (QID) | ORAL | 0 refills | Status: DC | PRN

## 2017-02-04 MED ORDER — MAGNESIUM OXIDE 400 (241.3 MG) MG PO TABS: 400.0000 mg | ORAL_TABLET | Freq: Every day | ORAL | 0 refills | Status: DC

## 2017-02-04 MED ORDER — METOPROLOL TARTRATE 25 MG PO TABS: 25.0000 mg | ORAL_TABLET | Freq: Three times a day (TID) | ORAL | 0 refills | Status: DC

## 2017-02-04 MED ORDER — DILTIAZEM HCL ER COATED BEADS 240 MG PO CP24: 240.0000 mg | ORAL_CAPSULE | Freq: Every day | ORAL | Status: DC

## 2017-02-04 MED ORDER — SODIUM CHLORIDE 0.9 % IV SOLN: 425.0000 mg | INTRAVENOUS | 0 refills | Status: DC

## 2017-02-04 MED ORDER — SODIUM CHLORIDE 0.9% FLUSH: 10.0000 mL | INTRAVENOUS | Status: DC | PRN

## 2017-02-04 MED ORDER — DILTIAZEM HCL ER COATED BEADS 120 MG PO CP24
120.0000 mg | ORAL_CAPSULE | Freq: Once | ORAL | Status: AC
  Administered 2017-02-04: 14:00:00 120 mg via ORAL
  Filled 2017-02-04: qty 1

## 2017-02-04 MED ORDER — MAGNESIUM OXIDE 400 (241.3 MG) MG PO TABS
400.0000 mg | ORAL_TABLET | Freq: Every day | ORAL | Status: DC
Start: 2017-02-05 — End: 2017-02-04

## 2017-02-04 MED ORDER — SODIUM CHLORIDE 0.9% FLUSH
10.0000 mL | Freq: Two times a day (BID) | INTRAVENOUS | Status: DC
  Administered 2017-02-04: 14:00:00 10 mL

## 2017-02-04 NOTE — Discharge Instructions (Signed)
Meropenum 1 gram iv every eight hours through July 2 nd Daptomycin iv for three more days Fluconazole 200mg  po daily   Bacteremia Bacteremia is the presence of bacteria in the blood. When bacteria enter the bloodstream, they can cause a life-threatening reaction called sepsis, which is a medical emergency. Bacteremia can spread to other parts of the body, including the heart, joints, and brain. What are the causes? This condition is caused by bacteria that get into the blood. Bacteria can enter the blood:  From a skin infection or a cut on your skin.  During an episode of pneumonia.  From an infection in your stomach or intestine (gastrointestinal infection).  From an infection in your bladder or urinary system (urinary tract infection).  During a dental or medical procedure.  After you brush your teeth so hard that your gums bleed.  When a bacterial infection in another part of the body spreads to the blood.  Through a dirty needle.  What increases the risk? This condition is more likely to develop in:  Children.  Elderly adults.  People who have a long-lasting (chronic) disease or medical condition.  People who have an artificial joint or heart valve.  People who have heart valve disease.  People who have a tube, such as a catheter or IV tube, that has been inserted for a medical treatment.  People who have a weak body defense system (immune system).  People who use IV drugs.  What are the signs or symptoms? Symptoms of this condition include:  Fever.  Chills.  A racing heart.  Shortness of breath.  Dizziness.  Weakness.  Confusion.  Nausea or vomiting.  Diarrhea.  In some cases, there are no symptoms. Bacteremia that has spread to the other parts of the body may cause symptoms in those areas. How is this diagnosed? This condition may be diagnosed with a physical exam and tests, such as:  A complete blood count (CBC). This test looks for signs of  infection.  Blood cultures. These look for bacteria in your blood.  Tests of any tubes that you may have inserted into your body, such as an IV tube or urinary catheter. These tests look for a source of infection.  Urine tests including urine cultures. These look for bacteria in the urine that could be a source of infection.  Imaging tests, such as an X-ray, CT scan, MRI, or heart ultrasound. These look for a source of infection in other parts of the body, such as the lungs, heart valves, or joints.  How is this treated? This condition may be treated with:  Antibiotic medicines given through an IV infusion. Depending on the source of infection, antibiotics may be needed for several weeks. At first, an antibiotic may be given to kill most types of blood bacteria. If your test results show that a certain kind of bacteria is causing problems, the antibiotic may be changed to kill only the bacteria that are causing problems.  Antibiotics taken by mouth.  IV fluids to support the body as you fight the infection.  Removing any catheter or device that could be a source of infection.  Blood pressure and breathing support, if you have sepsis.  Surgery to control the source or spread of infection, such as: ? Removing an infected implanted device. ? Removing infected tissue or an abscess.  This condition is usually treated at a hospital. If you are treated at home, you may need to come back for medicines, blood tests, and  evaluation. This is important. Follow these instructions at home:  Take over-the-counter and prescription medicines only as told by your health care provider.  If you were prescribed an antibiotic, take it as told by your health care provider. Do not stop taking the antibiotic even if you start to feel better.  Rest until your condition is under control.  Drink enough fluid to keep your urine clear or pale yellow.  Do not smoke. If you need help quitting, ask your health  care provider.  Keep all follow-up visits as told by your health care provider. This is important. How is this prevented?  Get the vaccinations that your health care provider recommends.  Clean and cover any scrapes or cuts.  Take good care of your skin. This includes regular bathing and moisturizing.  Wash your hands often.  Practice good oral hygiene. Brush your teeth two times a day and floss regularly. Get help right away if:  You have pain.  You have a fever.  You have trouble breathing.  Your skin becomes blotchy, pale, or clammy.  You develop confusion, dizziness, or weakness.  You develop diarrhea.  You develop any new symptoms after treatment. Summary  Bacteremia is the presence of bacteria in the blood. When bacteria enter the bloodstream, they can cause a life- threatening reaction called sepsis.  Children and elderly adults are at increased risk of bacteremia. Other risk factors include having a long-lasting (chronic) disease or a weak immune system, having an artificial joint or heart valve, having heart valve disease, having tubes that were inserted in the body for medical treatment, or using IV drugs.  Some symptoms of bacteremia include fever, chills, shortness of breath, confusion, nausea or vomiting, and diarrhea.  Tests may be done to diagnose a source of infection that led to bacteremia. These tests may include blood tests, urine tests, and imaging tests.  Bacteremia is usually treated with antibiotics, usually in a hospital. This information is not intended to replace advice given to you by your health care provider. Make sure you discuss any questions you have with your health care provider. Document Released: 05/26/2006 Document Revised: 07/09/2016 Document Reviewed: 07/09/2016 Elsevier Interactive Patient Education  [employer]. through July 2nd

## 2017-02-04 NOTE — Progress Notes (Signed)
CC: Sepsis Subjective: Patient doing much better. Planning to go home. Has had minimal to no output from her drains over the last 24 hours.  Objective: Vital signs in last 24 hours: Temp:  [99.1 F (37.3 C)-100.4 F (38 C)] 99.9 F (37.7 C) (06/12 1249) Pulse Rate:  [111-118] 118 (06/12 1249) Resp:  [16-20] 16 (06/12 1249) BP: (96-115)/(52-59) 100/52 (06/12 1249) SpO2:  [96 %-99 %] 97 % (06/12 1249) Last BM Date: 02/04/17  Intake/Output from previous day: 06/11 0701 - 06/12 0700 In: 739 [IV Piggyback:739] Out: 1600 [Urine:1600] Intake/Output this shift: Total I/O In: -  Out: 800 [Urine:800]  Physical exam:  Gen.: No acute distress Chest: Clear to auscultation Heart: Tachycardic Abdomen: Soft, nontender, nondistended. Wound VAC in place to the midline with good seal. 2 drains in the right lower quadrant with minimal to no output.  Lab Results: CBC   Recent Labs  02/02/17 0445 02/04/17 0903  WBC 19.2* 24.5*  HGB 7.7* 7.3*  HCT 23.6* 22.6*  PLT 632* 789*   BMET  Recent Labs  02/02/17 0445 02/04/17 0903  NA 135 133*  K 3.4* 3.8  CL 99* 97*  CO2 30 26  GLUCOSE 97 89  BUN <5* <5*  CREATININE 0.38* <0.30*  CALCIUM 8.1* 8.0*   PT/INR No results for input(s): LABPROT, INR in the last 72 hours. ABG No results for input(s): PHART, HCO3 in the last 72 hours.  Invalid input(s): PCO2, PO2  Studies/Results: No results found.  Anti-infectives: Anti-infectives    Start     Dose/Rate Route Frequency Ordered Stop   02/04/17 0000  DAPTOmycin 425 mg in sodium chloride 0.9 % 100 mL     425 mg 217 mL/hr over 30 Minutes Intravenous Every 24 hours 02/04/17 1250     02/04/17 0000  fluconazole (DIFLUCAN) 200 MG tablet     200 mg Oral Daily 02/04/17 1250 02/24/17 2359   02/02/17 1800  DAPTOmycin (CUBICIN) 425 mg in sodium chloride 0.9 % IVPB     425 mg 217 mL/hr over 30 Minutes Intravenous Every 24 hours 02/01/17 2026     02/01/17 1600  DAPTOmycin (CUBICIN) 419 mg  in sodium chloride 0.9 % IVPB  Status:  Discontinued     8 mg/kg  52.4 kg (Ideal) 216.8 mL/hr over 30 Minutes Intravenous Every 24 hours 02/01/17 1449 02/01/17 2026   01/31/17 1800  fluconazole (DIFLUCAN) IVPB 400 mg     400 mg 100 mL/hr over 120 Minutes Intravenous Daily-1800 01/30/17 1518     01/30/17 1630  fluconazole (DIFLUCAN) IVPB 800 mg     800 mg 200 mL/hr over 120 Minutes Intravenous  Once 01/30/17 1518 01/30/17 1950   01/30/17 0230  vancomycin (VANCOCIN) IVPB 1000 mg/200 mL premix  Status:  Discontinued     1,000 mg 200 mL/hr over 60 Minutes Intravenous Every 12 hours 01/30/17 0113 01/30/17 1316   01/30/17 0130  meropenem (MERREM) 1 g in sodium chloride 0.9 % 100 mL IVPB     1 g 200 mL/hr over 30 Minutes Intravenous Every 8 hours 01/30/17 0118     01/29/17 1745  vancomycin (VANCOCIN) IVPB 1000 mg/200 mL premix     1,000 mg 200 mL/hr over 60 Minutes Intravenous  Once 01/29/17 1735 01/30/17 0933      Assessment/Plan:  51 year old female with a competent history from perforated, metastatic cancer. The IR placed drain was removed at the bedside today without difficulty. She has been scheduled follow-up in the surgery clinic tomorrow before  being seen the cancer center to evaluate whether or not the last remaining drain can be removed. Patient voiced understanding and will follow-up in clinic tomorrow.  Janene Yousuf T. Adonis Huguenin, MD, [facility]  Day [facility] [phone] Night [facility] [phone] 02/04/2017

## 2017-02-04 NOTE — Progress Notes (Signed)
PT Cancellation Note  Patient Details Name: Anna [name] MRN: [id number] DOB: [birth year]   Cancelled Treatment:    Reason Eval/Treat Not Completed: Medical issues which prohibited therapy   Chart reviewed.  HgB 7.3, HCT 2.61 both trending down.  Will hold session and continue as appropriate.   Chesley Noon 02/04/2017, 10:40 AM

## 2017-02-04 NOTE — Discharge Summary (Signed)
[facility] NAME: Anna [name]    MR#:  [id number]  DATE OF BIRTH:  Sep 26, 1965  DATE OF ADMISSION:  01/29/2017 ADMITTING PHYSICIAN: Anna Bridge, DO  DATE OF DISCHARGE: 02/04/2017  PRIMARY CARE PHYSICIAN: McLean-Scocuzza, Nino Glow, MD    ADMISSION DIAGNOSIS:  Fever [R50.9] Sepsis, due to unspecified organism ([name]) [A41.9]  DISCHARGE DIAGNOSIS:  Active Problems:   Sepsis (Anna [name])   SECONDARY DIAGNOSIS:   Past Medical History:  Diagnosis Date  . Cancer (Anna [name])   . Cancer with unknown primary site [name])   . Colon polyp   . Diverticulosis   . Fibroid uterus   . Heme positive stool 08/01/2014  . HH (hiatus hernia)   . Large bowel perforation (Anna [name]) 12/31/2016  . Metastatic adenocarcinoma (Anna [name]) 12/20/2016  . Pelvic pain in female 01/13/2017  . Perforation of sigmoid colon (Anna [name])     HOSPITAL COURSE:   1. Sepsis. Repeat blood cultures with Aerococcus. This was a PICC line infection. The PICC line was removed on Saturday. Daptomycin was started on Saturday. The patient will receive one week of IV daptomycin via new PICC line that is placed on the day of discharge 02/04/2017. The patient was also on IV meropenem from previous abscess in the abdomen. That will be continued through July 2. The patient was also on IV Diflucan which will be Switch to oral Diflucan 200 mg daily that will continue through July 2 also. Case discussed with Dr. Ola Spurr infectious disease specialist. White blood cell count still high. Repeat blood cultures negative for 48 hours. Resume home health for IV antibiotics. 2. 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 The patient has a follow-up appointment with Dr. Grayland Ormond tomorrow for [name] infusion. 3. Sinus Tachycardia. Exam limited secondary to hypotension with medications. I will increase Cardizem CD to 240 mg daily and continue metoprolol 25 mg 3 times a day.  Heart rate has been anywhere from 100-150. Patient not having any palpitations at this time. Likely secondary to sepsis and metastatic disease. 4. History of DVT on Eliquis 5. Multiple PVCs in a row nonsustained ventricular tachycardia. IV magnesium given oral magnesium given upon discharge patient already on metoprolol. Echocardiogram showing a normal EF. 6. Anemia of chronic disease. Hemoglobin is 7.3. Patient refused transfusions during hospital course. Hemoglobin has been stable but low.  May benefit from transfusion but did not want on this clinical course. Low-dose iron started. 7. Surgical follow-up for wound VAC on the abdomen and drain removal.  DISCHARGE CONDITIONS:   Guarded  CONSULTS OBTAINED:  Treatment Team:  Jules Husbands, MD Leonel Ramsay, MD Vickie Epley, MD Lequita Asal, MD  DRUG ALLERGIES:   Allergies  Allergen Reactions  . Cefepime Rash  . Penicillins Rash    As a child. Has patient had a PCN reaction causing immediate rash, facial/tongue/throat swelling, SOB or lightheadedness with hypotension: Unknown Has patient had a PCN reaction causing severe rash involving mucus membranes or skin necrosis: Unknown Has patient had a PCN reaction that required hospitalization: No Has patient had a PCN reaction occurring within the last 10 years: No If all of the above answers are "NO", then may proceed with Cephalosporin use.     DISCHARGE MEDICATIONS:   Current Discharge Medication List    START taking these medications   Details  fluconazole (DIFLUCAN) 200 MG tablet Take 1 tablet (200 mg total) by mouth daily. Qty: 20 tablet, Refills: 0  DAPTOmycin 425 mg in sodium chloride 0.9 % 100 mL Inject 425 mg into the vein daily. Qty: 3 Dose, Refills: 0    diltiazem (CARDIZEM CD) 240 MG 24 hr capsule Take 1 capsule (240 mg total) by mouth daily. Qty: 30 capsule, Refills: 0    ferrous sulfate 325 (65 FE) MG tablet Take 1 tablet (325 mg total) by  mouth daily with breakfast. Qty: 30 tablet, Refills: 0    magnesium oxide (MAG-OX) 400 (241.3 Mg) MG tablet Take 1 tablet (400 mg total) by mouth daily. Qty: 30 tablet, Refills: 0    metoprolol tartrate (LOPRESSOR) 25 MG tablet Take 1 tablet (25 mg total) by mouth 3 (three) times daily after meals. Qty: 90 tablet, Refills: 0    ondansetron (ZOFRAN) 4 MG tablet Take 1 tablet (4 mg total) by mouth every 6 (six) hours as needed for nausea. Qty: 20 tablet, Refills: 0      CONTINUE these medications which have CHANGED   Details  apixaban (ELIQUIS) 5 MG TABS tablet Take 1 tablet (5 mg total) by mouth 2 (two) times daily. Qty: 60 tablet, Refills: 0    oxyCODONE-acetaminophen (ROXICET) 5-325 MG tablet Take 1 tablet by mouth every 6 (six) hours as needed. Qty: 20 tablet, Refills: 0      CONTINUE these medications which have NOT CHANGED   Details  acetaminophen (TYLENOL) 500 MG tablet Take 500 mg by mouth as needed for mild pain.    diphenhydrAMINE (BENADRYL) 25 mg capsule Take 1 capsule (25 mg total) by mouth every 6 (six) hours as needed (Rash). Qty: 60 capsule, Refills: 0    meropenem 1 g in sodium chloride 0.9 % 100 mL Inject 1 g into the vein every 8 (eight) hours. Qty: 42 Dose, Refills: 1      STOP taking these medications     metoprolol succinate (TOPROL-XL) 25 MG 24 hr tablet          DISCHARGE INSTRUCTIONS:    Follow-up with all the specialists as outpatient. Continue IV antibiotics.  If you experience worsening of your admission symptoms, develop shortness of breath, life threatening emergency, suicidal or homicidal thoughts you must seek medical attention immediately by calling 911 or calling your MD immediately  if symptoms less severe.  You Must read complete instructions/literature along with all the possible adverse reactions/side effects for all the Medicines you take and that have been prescribed to you. Take any new Medicines after you have completely  understood and accept all the possible adverse reactions/side effects.   Please note  You were cared for by a hospitalist during your hospital stay. If you have any questions about your discharge medications or the care you received while you were in the hospital after you are discharged, you can call the unit and asked to speak with the hospitalist on call if the hospitalist that took care of you is not available. Once you are discharged, your primary care physician will handle any further medical issues. Please note that NO REFILLS for any discharge medications will be authorized once you are discharged, as it is imperative that you return to your primary care physician (or establish a relationship with a primary care physician if you do not have one) for your aftercare needs so that they can reassess your need for medications and monitor your lab values.    Today   CHIEF COMPLAINT:   Chief Complaint  Patient presents with  . Fever    HISTORY OF PRESENT ILLNESS:  Anna [name]  is a 51 y.o. female with a known history of Metastatic adenocarcinoma unknown primary presented with fever and found to have a PICC line infection   VITAL SIGNS:  Blood pressure (!) 100/52, pulse (!) 118, temperature 99.9 F (37.7 C), temperature source Oral, resp. rate 16, height 5\' 3"  (1.6 m), weight 54.9 kg (121 lb), last menstrual period 03/01/2015, SpO2 97 %.    PHYSICAL EXAMINATION:  GENERAL:  51 y.o.-year-old patient lying in the bed with no acute distress.  EYES: Pupils equal, round, reactive to light and accommodation. No scleral icterus. Extraocular muscles intact.  HEENT: Head atraumatic, normocephalic. Oropharynx and nasopharynx clear.  NECK:  Supple, no jugular venous distention. No thyroid enlargement, no tenderness.  LUNGS: Normal breath sounds bilaterally, no wheezing, rales,rhonchi or crepitation. No use of accessory muscles of respiration.  CARDIOVASCULAR: S1, S2 Tachycardic. No murmurs,  rubs, or gallops.  ABDOMEN: Soft, non-tender, non-distended. Bowel sounds present. No organomegaly or mass.  EXTREMITIES: No pedal edema, cyanosis, or clubbing.  NEUROLOGIC: Cranial nerves II through XII are intact. Muscle strength 5/5 in all extremities. Sensation intact. Gait not checked.  PSYCHIATRIC: The patient is alert and oriented x 3.  SKIN: No obvious rash, lesion, or ulcer.   DATA REVIEW:   CBC  Recent Labs Lab 02/04/17 0903  WBC 24.5*  HGB 7.3*  HCT 22.6*  PLT 789*    Chemistries   Recent Labs Lab 01/29/17 1714  02/04/17 0903  NA 132*  < > 133*  K 3.1*  < > 3.8  CL 93*  < > 97*  CO2 30  < > 26  GLUCOSE 96  < > 89  BUN 11  < > <5*  CREATININE 0.38*  < > <0.30*  CALCIUM 8.0*  < > 8.0*  MG  --   < > 2.2  AST 78*  --   --   ALT 62*  --   --   ALKPHOS 90  --   --   BILITOT 0.7  --   --   < > = values in this interval not displayed.   Microbiology Results  Results for orders placed or performed during the hospital encounter of 01/29/17  Culture, blood (Routine x 2)     Status: None   Collection Time: 01/29/17  5:14 PM  Result Value Ref Range Status   Specimen Description BLOOD RAC  Final   Special Requests   Final    BOTTLES DRAWN AEROBIC AND ANAEROBIC Blood Culture adequate volume   Culture NO GROWTH 5 DAYS  Final   Report Status 02/03/2017 FINAL  Final  Culture, blood (Routine x 2)     Status: Abnormal   Collection Time: 01/29/17  5:14 PM  Result Value Ref Range Status   Specimen Description BLOOD LARM  Final   Special Requests   Final    BOTTLES DRAWN AEROBIC AND ANAEROBIC Blood Culture adequate volume   Culture  Setup Time   Final    GRAM POSITIVE COCCI AEROBIC BOTTLE ONLY CRITICAL RESULT CALLED TO, READ BACK BY AND VERIFIED WITH: KAREN HAYES 01/31/17 0909 SGD    Culture (A)  Final    STAPHYLOCOCCUS SPECIES (COAGULASE NEGATIVE) THE SIGNIFICANCE OF ISOLATING THIS ORGANISM FROM A SINGLE SET OF BLOOD CULTURES WHEN MULTIPLE SETS ARE DRAWN IS  UNCERTAIN. PLEASE NOTIFY THE MICROBIOLOGY DEPARTMENT WITHIN ONE WEEK IF SPECIATION AND SENSITIVITIES ARE REQUIRED. Performed at [facility], [place] [address], [place], [place] [address]  Report Status 02/01/2017 FINAL  Final  Blood Culture ID Panel (Reflexed)     Status: Abnormal   Collection Time: 01/29/17  5:14 PM  Result Value Ref Range Status   Enterococcus species NOT DETECTED NOT DETECTED Final   Listeria monocytogenes NOT DETECTED NOT DETECTED Final   Staphylococcus species DETECTED (A) NOT DETECTED Final    Comment: Methicillin (oxacillin) resistant coagulase negative staphylococcus. Possible blood culture contaminant (unless isolated from more than one blood culture draw or clinical case suggests pathogenicity). No antibiotic treatment is indicated for blood  culture contaminants. CRITICAL RESULT CALLED TO, READ BACK BY AND VERIFIED WITH: KAREN HAYES 01/31/17 0909 SGD    Staphylococcus aureus NOT DETECTED NOT DETECTED Final   Methicillin resistance DETECTED (A) NOT DETECTED Final    Comment: CRITICAL RESULT CALLED TO, READ BACK BY AND VERIFIED WITH: KAREN HAYES 01/31/17 0909 SGD    Streptococcus species NOT DETECTED NOT DETECTED Final   Streptococcus agalactiae NOT DETECTED NOT DETECTED Final   Streptococcus pneumoniae NOT DETECTED NOT DETECTED Final   Streptococcus pyogenes NOT DETECTED NOT DETECTED Final   Acinetobacter baumannii NOT DETECTED NOT DETECTED Final   Enterobacteriaceae species NOT DETECTED NOT DETECTED Final   Enterobacter cloacae complex NOT DETECTED NOT DETECTED Final   Escherichia coli NOT DETECTED NOT DETECTED Final   Klebsiella oxytoca NOT DETECTED NOT DETECTED Final   Klebsiella pneumoniae NOT DETECTED NOT DETECTED Final   Proteus species NOT DETECTED NOT DETECTED Final   Serratia marcescens NOT DETECTED NOT DETECTED Final   Haemophilus influenzae NOT DETECTED NOT DETECTED Final   Neisseria meningitidis NOT DETECTED NOT DETECTED Final   Pseudomonas  aeruginosa NOT DETECTED NOT DETECTED Final   Candida albicans NOT DETECTED NOT DETECTED Final   Candida glabrata NOT DETECTED NOT DETECTED Final   Candida krusei NOT DETECTED NOT DETECTED Final   Candida parapsilosis NOT DETECTED NOT DETECTED Final   Candida tropicalis NOT DETECTED NOT DETECTED Final  MRSA PCR Screening     Status: None   Collection Time: 01/30/17  9:27 AM  Result Value Ref Range Status   MRSA by PCR NEGATIVE NEGATIVE Final    Comment:        The GeneXpert MRSA Assay (FDA approved for NASAL specimens only), is one component of a comprehensive MRSA colonization surveillance program. It is not intended to diagnose MRSA infection nor to guide or monitor treatment for MRSA infections.   Culture, blood (Routine X 2) w Reflex to ID Panel     Status: Abnormal (Preliminary result)   Collection Time: 01/31/17  1:05 PM  Result Value Ref Range Status   Specimen Description BLOOD RIGHT HAND  Final   Special Requests (A)  Final    AEROCOCCUS SPECIES Blood Culture results may not be optimal due to an inadequate volume of blood received in culture bottles   Culture NO GROWTH 4 DAYS  Final   Report Status PENDING  Incomplete  Culture, blood (Routine X 2) w Reflex to ID Panel     Status: None (Preliminary result)   Collection Time: 01/31/17  2:25 PM  Result Value Ref Range Status   Specimen Description BLOOD RIGHT ARM  Final   Special Requests   Final    BOTTLES DRAWN AEROBIC AND ANAEROBIC Blood Culture adequate volume   Culture NO GROWTH 4 DAYS  Final   Report Status PENDING  Incomplete  CULTURE, BLOOD (ROUTINE X 2) w Reflex to ID Panel     Status: None (Preliminary result)  Collection Time: 02/02/17  5:00 AM  Result Value Ref Range Status   Specimen Description BLOOD BLOOD LEFT HAND  Final   Special Requests   Final    BOTTLES DRAWN AEROBIC AND ANAEROBIC Blood Culture results may not be optimal due to an inadequate volume of blood received in culture bottles   Culture NO  GROWTH 2 DAYS  Final   Report Status PENDING  Incomplete  CULTURE, BLOOD (ROUTINE X 2) w Reflex to ID Panel     Status: None (Preliminary result)   Collection Time: 02/02/17  5:15 AM  Result Value Ref Range Status   Specimen Description BLOOD BLOOD RIGHT ARM  Final   Special Requests   Final    BOTTLES DRAWN AEROBIC AND ANAEROBIC Blood Culture results may not be optimal due to an inadequate volume of blood received in culture bottles   Culture NO GROWTH 2 DAYS  Final   Report Status PENDING  Incomplete      Management plans discussed with the patient, family and they are in agreement.  CODE STATUS:     Code Status Orders        Start     Ordered   01/30/17 0009  Full code  Continuous     01/30/17 0009    Code Status History    Date Active Date Inactive Code Status Order ID Comments User Context   01/21/2017  9:00 PM 01/24/2017  5:38 PM Full Code [id number]  Fritzi Mandes, MD Inpatient   12/31/2016  3:58 AM 01/10/2017  4:29 PM Full Code [id number]  Jules Husbands, MD Inpatient   12/30/2016 10:48 PM 12/31/2016  3:58 AM Full Code [id number]  Schermerhorn, Gwen Her, MD ED   12/20/2016 11:16 AM 12/21/2016  1:51 PM Full Code [id number]  Schermerhorn, Gwen Her, MD Inpatient      TOTAL TIME TAKING CARE OF THIS PATIENT: 40 minutes.    Loletha Grayer M.D on 02/04/2017 at 3:51 PM  Between 7am to 6pm - Pager - [phone]  After 6pm go to [email or web address] - password [email or web address] [facility]  [phone]  CC: Primary care physician; McLean-Scocuzza, Nino Glow, MD

## 2017-02-04 NOTE — Progress Notes (Signed)
Peripherally Inserted Central Catheter/Midline Placement  The IV Nurse has discussed with the patient and/or persons authorized to consent for the patient, the purpose of this procedure and the potential benefits and risks involved with this procedure.  The benefits include less needle sticks, lab draws from the catheter, and the patient may be discharged home with the catheter. Risks include, but not limited to, infection, bleeding, blood clot (thrombus formation), and puncture of an artery; nerve damage and irregular heartbeat and possibility to perform a PICC exchange if needed/ordered by physician.  Alternatives to this procedure were also discussed.  Bard Power PICC patient education guide, fact sheet on infection prevention and patient information card has been provided to patient /or left at bedside.    PICC/Midline Placement Documentation        Anna [name] 02/04/2017, 2:01 PM

## 2017-02-04 NOTE — Care Management (Signed)
Discharge to home today per Dr. Leslye Peer. [facility] will resume Meropenum IV administration and new order for Daptomycin.  New PICC placed.  First dose of Daptomycin will be given prior to discharge Wound Vac. Will be changed prior to discharge.  Family will transport.  Shelbie Ammons RN MSN CCM Care Management [phone]

## 2017-02-04 NOTE — Progress Notes (Signed)
PHARMACY CONSULT NOTE FOR:  OUTPATIENT  PARENTERAL ANTIBIOTIC THERAPY (OPAT)  Indication: Intra-abdominal abscess  Regimen: Merrem 1 g IV q8 hours (stop date 7/2); Daptomycin 425 mg IV q24 hours stop date (6/15) End date: see above  IV antibiotic discharge orders are pended. To discharging provider:  please sign these orders via discharge navigator,  Select New Orders & click on the button choice - Manage This Unsigned Work.     Thank you for allowing pharmacy to be a part of this patient's care.  Jo-Anne Kluth D 02/04/2017, 2:56 PM

## 2017-02-04 NOTE — Progress Notes (Signed)
Patient discharged home per MD order. Prescriptions given to patient. Home wound vac placed. All discharge instructions given and all questions answered.

## 2017-02-04 NOTE — Progress Notes (Signed)
Physical Therapy Treatment Patient Details Name: Anna [name] MRN: [id number] DOB: 02-04-1966 Today's Date: 02/04/2017    History of Present Illness presented to ER secondary to fever, tachycardia; admitted with sepsis related to intra-abdominal abscess.  Of note, patient with known metastatic adenocarcinoma (unknown primary), status post exploratory laparoscopy 5/8 due to perforation of sigmoid colon; complicated by intra-abdominal abscess.  Currently with JP drain (x2) on R, midline abdominal wound vac.    PT Comments    Pt's nurse requested session this am despite lab values.  Reported possible discharge to home today. Bed mobility and bedside commode without assist.  She did have a small LOB upon standing but was able to recover without assist using bedrail to steady herself.  She was able to ambulate x 1 around unit holding onto IV pole for balance.  Overall did well with minimal fatigue.  Session was primarily limited by HR which increased to 131 with gait.  120 upon rest in supine.  Discussed mobility with primary nurse.    Follow Up Recommendations  No PT follow up     Equipment Recommendations       Recommendations for Other Services       Precautions / Restrictions Precautions Precautions: Fall Precaution Comments: JP drain x2, wound vac Restrictions Weight Bearing Restrictions: No    Mobility  Bed Mobility Overal bed mobility: Modified Independent                Transfers   Equipment used: None Transfers: Sit to/from Stand Sit to Stand: Supervision            Ambulation/Gait Ambulation/Gait assistance: Supervision;Min guard Ambulation Distance (Feet): 250 Feet Assistive device: None Gait Pattern/deviations: Step-through pattern     General Gait Details: pushed IV pole for steadiness   Stairs            Wheelchair Mobility    Modified Rankin (Stroke Patients Only)       Balance Overall balance assessment: Needs  assistance Sitting-balance support: Feet supported;No upper extremity supported Sitting balance-Leahy Scale: Good     Standing balance support: No upper extremity supported Standing balance-Leahy Scale: Fair                              Cognition Arousal/Alertness: Awake/alert Behavior During Therapy: WFL for tasks assessed/performed Overall Cognitive Status: Within Functional Limits for tasks assessed                                        Exercises      General Comments        Pertinent Vitals/Pain Pain Assessment: 0-10 Pain Score: 2  Pain Location: abdomen Pain Descriptors / Indicators: Sore    Home Living                      Prior Function            PT Goals (current goals can now be found in the care plan section) Progress towards PT goals: Progressing toward goals    Frequency    Min 2X/week      PT Plan      Co-evaluation              AM-PAC PT "6 Clicks" Daily Activity  Outcome Measure  Difficulty turning over in bed (including adjusting bedclothes,  sheets and blankets)?: None Difficulty moving from lying on back to sitting on the side of the bed? : None Difficulty sitting down on and standing up from a chair with arms (e.g., wheelchair, bedside commode, etc,.)?: A Little Help needed moving to and from a bed to chair (including a wheelchair)?: A Little Help needed walking in hospital room?: A Little Help needed climbing 3-5 steps with a railing? : A Little 6 Click Score: 20    End of Session Equipment Utilized During Treatment: Gait belt Activity Tolerance: Patient tolerated treatment well Patient left: in bed;with call bell/phone within reach;with family/visitor present Nurse Communication: Mobility status       Time: 3532-9924 PT Time Calculation (min) (ACUTE ONLY): 8 min  Charges:  $Gait Training: 8-22 mins                    G Codes:       Chesley Noon, PTA 02/04/17, 11:26 AM

## 2017-02-04 NOTE — Progress Notes (Signed)
Infectious Disease Long Term IV Antibiotic Orders  Diagnosis: Intrabdominal infection, picc infection   Culture results BCX aerococcus and coag neg staph Abscess - E coli, viridans, Eikenlla  Allergies:  Allergies  Allergen Reactions  . Cefepime Rash  . Penicillins Rash    As a child. Has patient had a PCN reaction causing immediate rash, facial/tongue/throat swelling, SOB or lightheadedness with hypotension: Unknown Has patient had a PCN reaction causing severe rash involving mucus membranes or skin necrosis: Unknown Has patient had a PCN reaction that required hospitalization: No Has patient had a PCN reaction occurring within the last 10 years: No If all of the above answers are "NO", then may proceed with Cephalosporin use.     Discharge antibiotics Daptomycin 425 mg every  24 hours   (weekly CPK) x 3 days  Meropenem  1 mg every  8 Hours until July 2nd  Fluconazole 200 mg po qd until July 2nd   PICC Care per protocol Labs weekly while on IV antibiotics      CBC w diff   Comprehensive met panel Vancomycin Trough   CRP  CPK (for daptomycin)  Planned duration of antibiotics Dapto - 6/15, meropenem and fluconazole 7/2  Stop date As above    Follow up clinic date 7/2  FAX weekly labs to [phone]  Leonel Ramsay, MD

## 2017-02-04 NOTE — Progress Notes (Signed)
Dr Metro Kung notified that patient had 4 beat run of V Tach. MD acknowledged, no new orders given.

## 2017-02-04 NOTE — Plan of Care (Signed)
Problem: Education: Goal: Knowledge of [facility] Education information/materials will improve Outcome: Progressing Tmax 38.0, received PRN PO Tylenol 650mg  x, T 37.3 @ recheck.  Dr. Ara Kussmaul FYI paged, no add'l orders.  No complaints overnight.  Denies pain.  Bed in low position, call bell within reach.  WCTM.

## 2017-02-04 NOTE — Progress Notes (Signed)
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  INTERVAL HISTORY: Patient returns to clinic today for further evaluation and consideration of initiating treatment with immunotherapy. She has had multiple hospital admissions for sepsis and continued treatment of her known abscess. She has significant weakness and fatigue. She has a poor appetite and continues to lose weight. She does not complain of pain today. She has had no problem with her drain. She had no neurologic complaints. She denies any recent fevers. She has no chest pain or shortness of breath. She denies any nausea, vomiting, constipation, or diarrhea. Patient feels generally terrible, but offers no further specific complaints.  REVIEW OF SYSTEMS:   Review of Systems  Constitutional: Positive for fever, malaise/fatigue and weight loss.  Respiratory: Negative.  Negative for cough and shortness of breath.   Cardiovascular: Negative.  Negative for chest pain and leg swelling.  Gastrointestinal: Positive for abdominal pain. Negative for nausea.  Genitourinary: Negative.   Musculoskeletal: Negative.   Skin: Negative.  Negative for rash.  Neurological: Positive for weakness.  Psychiatric/Behavioral: The patient is nervous/anxious.     As per HPI. Otherwise, a complete review of systems is negative.  PAST MEDICAL HISTORY: Past Medical History:  Diagnosis Date  . Cancer ([place])   . Cancer with unknown primary site [facility])   . Colon polyp   . Diverticulosis   . Fibroid uterus   . Heme positive stool 08/01/2014  . HH (hiatus hernia)   . Large bowel perforation ([place]) 12/31/2016  . Metastatic adenocarcinoma ([place]) 12/20/2016  .  Pelvic pain in female 01/13/2017  . Perforation of sigmoid colon ([place])     PAST SURGICAL HISTORY: Past Surgical History:  Procedure Laterality Date  . COLONOSCOPY    . CYSTOSCOPY  12/20/2016   Procedure: CYSTOSCOPY;  Surgeon: Boykin Nearing, MD;  Location: [facility] ORS;  Service: Gynecology;;  . ESOPHAGOGASTRODUODENOSCOPY    . LAPAROSCOPIC BILATERAL SALPINGECTOMY Bilateral 12/20/2016   Procedure: LAPAROSCOPIC BILATERAL SALPINGOOPHERECTOMY,  Excision of anterior 5 cm calcified uterine fibroid;  Surgeon: Boykin Nearing, MD;  Location: [facility] ORS;  Service: Gynecology;  Laterality: Bilateral;  . LAPAROSCOPIC LYSIS OF ADHESIONS  12/20/2016   Procedure: LAPAROSCOPIC LYSIS OF ADHESIONS WITH DISSECTION OF LEFT TUBE AND OVARY.;  Surgeon: Olean Ree, MD;  Location: [facility] ORS;  Service: General;;  . LAPAROTOMY N/A 12/31/2016   Procedure: EXPLORATORY LAPAROTOMY, sigmoid colectomy, removal of omentum, colostomy;  Surgeon: Jules Husbands, MD;  Location: [facility] ORS;  Service: General;  Laterality: N/A;  . SIGMOIDOSCOPY  12/20/2016   Procedure: RIGID SIGMOIDOSCOPY;  Surgeon: Olean Ree, MD;  Location: [facility] ORS;  Service: General;;  . TONSILLECTOMY      FAMILY HISTORY: Family History  Problem Relation Age of Onset  . Colon cancer Mother 35  . Diabetes Mother   . Asthma Father   . Breast cancer Neg Hx     ADVANCED DIRECTIVES (Y/N):  N  HEALTH MAINTENANCE: Social History  Substance Use Topics  . Smoking status: Never Smoker  . Smokeless tobacco: Never Used  . Alcohol use No     Colonoscopy:  PAP:  Bone density:  Lipid panel:  Allergies  Allergen Reactions  . Cefepime Rash  . Penicillins Rash    As a  child. Has patient had a PCN reaction causing immediate rash, facial/tongue/throat swelling, SOB or lightheadedness with hypotension: Unknown Has patient had a PCN reaction causing severe rash involving mucus membranes or skin necrosis: Unknown Has patient had a PCN reaction that  required hospitalization: No Has patient had a PCN reaction occurring within the last 10 years: No If all of the above answers are "NO", then may proceed with Cephalosporin use.     Current Outpatient Prescriptions  Medication Sig Dispense Refill  . acetaminophen (TYLENOL) 500 MG tablet Take 500 mg by mouth as needed for mild pain.    Marland Kitchen apixaban (ELIQUIS) 5 MG TABS tablet Take 1 tablet (5 mg total) by mouth 2 (two) times daily. 60 tablet 0  . DAPTOmycin 425 mg in sodium chloride 0.9 % 100 mL Inject 425 mg into the vein daily. 3 Dose 0  . [START ON 02/05/2017] diltiazem (CARDIZEM CD) 240 MG 24 hr capsule Take 1 capsule (240 mg total) by mouth daily. 30 capsule 0  . diphenhydrAMINE (BENADRYL) 25 mg capsule Take 1 capsule (25 mg total) by mouth every 6 (six) hours as needed (Rash). 60 capsule 0  . [START ON 02/05/2017] ferrous sulfate 325 (65 FE) MG tablet Take 1 tablet (325 mg total) by mouth daily with breakfast. 30 tablet 0  . fluconazole (DIFLUCAN) 200 MG tablet Take 1 tablet (200 mg total) by mouth daily. 20 tablet 0  . [START ON 02/05/2017] magnesium oxide (MAG-OX) 400 (241.3 Mg) MG tablet Take 1 tablet (400 mg total) by mouth daily. 30 tablet 0  . meropenem 1 g in sodium chloride 0.9 % 100 mL Inject 1 g into the vein every 8 (eight) hours. 42 Dose 1  . metoprolol tartrate (LOPRESSOR) 25 MG tablet Take 1 tablet (25 mg total) by mouth 3 (three) times daily after meals. 90 tablet 0  . ondansetron (ZOFRAN) 4 MG tablet Take 1 tablet (4 mg total) by mouth every 6 (six) hours as needed for nausea. 20 tablet 0  . oxyCODONE-acetaminophen (ROXICET) 5-325 MG tablet Take 1 tablet by mouth every 6 (six) hours as needed. 20 tablet 0   No current facility-administered medications for this visit.     OBJECTIVE: There were no vitals filed for this visit.   There is no height or weight on file to calculate BMI.    ECOG FS:1 - Symptomatic but completely ambulatory  General: Well-developed, well-nourished,  no acute distress. Eyes: Pink conjunctiva, anicteric sclera. HEENT: Normocephalic, moist mucous membranes, clear oropharnyx. Lungs: Clear to auscultation bilaterally. Heart: Regular rate and rhythm. No rubs, murmurs, or gallops. Abdomen: Wound VAC in place. Musculoskeletal: No edema, cyanosis, or clubbing. Neuro: Alert, answering all questions appropriately. Cranial nerves grossly intact. Skin: No rashes or petechiae noted. Psych: Normal affect.   LAB RESULTS:  Lab Results  Component Value Date   NA 133 (L) 02/04/2017   K 3.8 02/04/2017   CL 97 (L) 02/04/2017   CO2 26 02/04/2017   GLUCOSE 89 02/04/2017   BUN <5 (L) 02/04/2017   CREATININE <0.30 (L) 02/04/2017   CALCIUM 8.0 (L) 02/04/2017   PROT 6.1 (L) 01/29/2017   ALBUMIN 2.5 (L) 01/29/2017   AST 78 (H) 01/29/2017   ALT 62 (H) 01/29/2017   ALKPHOS 90 01/29/2017   BILITOT 0.7 01/29/2017   GFRNONAA NOT CALCULATED 02/04/2017   GFRAA NOT CALCULATED 02/04/2017    Lab Results  Component Value Date   WBC 24.5 (H) 02/04/2017   NEUTROABS 17.3 (H) 01/29/2017  HGB 7.3 (L) 02/04/2017   HCT 22.6 (L) 02/04/2017   MCV 86.4 02/04/2017   PLT 789 (H) 02/04/2017     STUDIES: Dg Chest 1 View  Result Date: 01/23/2017 CLINICAL DATA:  Pre V/Q scan examination. EXAM: CHEST 1 VIEW COMPARISON:  CT scan chest of Dec 30, 2016 FINDINGS: The lungs are adequately inflated. There is subtle nodularity within both lungs with the largest nodule noted in the rib right upper lung measuring just under 2 cm in greatest dimension. There is no alveolar infiltrate. A trace of pleural fluid at the left lung base laterally is suspected. The heart is top-normal in size. The pulmonary vascularity is normal. There is soft tissue fullness in the right hilar region. The mediastinum is normal in width. IMPRESSION: Subtle pulmonary nodules bilaterally worrisome for metastatic disease. No discrete pneumonia. Small left pleural effusion. Probable right hilar  lymphadenopathy. Electronically Signed   By: David  Martinique M.D.   On: 01/23/2017 12:44   Dg Chest 2 View  Result Date: 01/29/2017 CLINICAL DATA:  51 year old with metastatic colon cancer, presenting with fever. Current history of pulmonary emboli. EXAM: CHEST  2 VIEW COMPARISON:  CTA chest 01/23/2017.  Chest x-ray 01/23/2017. FINDINGS: AP semi-erect and lateral images were obtained. Cardiomediastinal silhouette unremarkable, unchanged. Multiple bilateral lung nodules as identified on the 01/23/2017 examinations. Mild atelectasis in the lower lobes as noted on the prior CT. No new pulmonary parenchymal abnormalities. No visible pleural effusions. Left arm PICC tip in the lower SVC. IMPRESSION: 1. Stable atelectasis involving the lower lobes since the CT 01/23/2017. 2. Multiple bilateral pulmonary metastases as noted on the CT. 3. No new/acute cardiopulmonary abnormalities. Electronically Signed   By: Evangeline Dakin M.D.   On: 01/29/2017 18:17   Ct Angio Chest Pe W Or Wo Contrast  Result Date: 01/29/2017 CLINICAL DATA:  Fever, tachycardia. EXAM: CT ANGIOGRAPHY CHEST CT ABDOMEN AND PELVIS WITH CONTRAST TECHNIQUE: Multidetector CT imaging of the chest was performed using the standard protocol during bolus administration of intravenous contrast. Multiplanar CT image reconstructions and MIPs were obtained to evaluate the vascular anatomy. Multidetector CT imaging of the abdomen and pelvis was performed using the standard protocol during bolus administration of intravenous contrast. CONTRAST:  100 mL of Isovue 370 intravenously. COMPARISON:  CT scan of Jan 21, 2017. FINDINGS: CTA CHEST FINDINGS Cardiovascular: Satisfactory opacification of the pulmonary arteries to the segmental level. No evidence of pulmonary embolism. Normal heart size. No pericardial effusion. Mediastinum/Nodes: No enlarged mediastinal, hilar, or axillary lymph nodes. Thyroid gland, trachea, and esophagus demonstrate no significant findings.  Lungs/Pleura: Multiple nodular densities are noted throughout both lungs consistent with metastatic disease. Minimal bibasilar subsegmental atelectasis is noted. No pneumothorax or pleural effusion is noted. Musculoskeletal: No chest wall abnormality. No acute or significant osseous findings. Review of the MIP images confirms the above findings. CT ABDOMEN and PELVIS FINDINGS Hepatobiliary: The gallstones are noted. Stable hepatic lesions are noted consistent with metastatic disease. Pancreas: Unremarkable. No pancreatic ductal dilatation or surrounding inflammatory changes. Spleen: Normal in size without focal abnormality. Adrenals/Urinary Tract: Adrenal glands appear normal. No hydronephrosis or renal obstruction is noted. No renal or ureteral calculi are noted. Urinary bladder is unremarkable. Stable complex low density is noted posteriorly in the left renal cortex concerning for infection, infarction or possibly metastatic disease. Stomach/Bowel: There is no evidence of bowel obstruction. 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 Vascular/Lymphatic: No significant vascular abnormality is noted. Left periaortic adenopathy is noted consistent with metastatic disease. The largest  measures 15 mm. Reproductive: Reportedly, both ovaries have been removed. Probable fibroid uterus is noted. Continued presence of inflammatory changes seen around the uterus in the pelvis with multiple small fluid collections, the largest measuring 33 x 21 mm. These do not appear to be significantly changed compared to prior exam. Other: Surgical drain remains in the subhepatic space. No fluid collection is noted around this drain. Continued and stable presence of pigtail drainage catheter seen in the right lower quadrant with no significant fluid collection noted around it. Midline surgical incision is noted. Musculoskeletal: No acute or significant osseous findings. Review of the MIP images confirms the above findings. IMPRESSION: No  definite evidence of pulmonary embolus. Continued presence of multiple pulmonary and hepatic metastatic lesions. Stable complex low density seen posteriorly in left renal cortex concerning for infection, infarction or possibly metastatic lesion. Left periaortic adenopathy is noted with largest lymph node measuring 15 mm. This is consistent with metastatic disease. Continued presence of surgical drain seen in subhepatic space and pigtail drainage catheter in the right lower quadrant, with no significant fluid collection is noted around either catheter. Continued presence of inflammatory changes in the pelvis adjacent to the fibroid uterus, with multiple small complex fluid collections which are not significantly changed compared to prior exam. Electronically Signed   By: Marijo Conception, M.D.   On: 01/29/2017 20:38   Ct Angio Chest Pe W Or Wo Contrast  Result Date: 01/23/2017 CLINICAL DATA:  51 year old with recent diagnosis of metastatic adenocarcinoma of unknown primary, presenting with tachycardia and sepsis. Patient had colonic perforation with a large intra-abdominal abscess on 12/30/2016 for which she underwent colon resection. Ventilation-perfusion lung scan earlier today indicated high probability of pulmonary embolism. One view chest x-ray obtained earlier today demonstrated bilateral lung nodules. EXAM: CT ANGIOGRAPHY CHEST WITH CONTRAST TECHNIQUE: Multidetector CT imaging of the chest was performed using the standard protocol during bolus administration of intravenous contrast. Multiplanar CT image reconstructions and MIPs were obtained to evaluate the vascular anatomy. CONTRAST:  75 mL Isovue 370 IV. COMPARISON:  CT chest abdomen and pelvis 12/30/2016. Chest X leg and nuclear medicine V/Q scan performed earlier today. FINDINGS: Cardiovascular: Contrast opacification of pulmonary arteries is very good. Filling defects within segmental branches of the right upper lobe and right middle lobe pulmonary  arteries. Marked narrowing of the right middle lobe pulmonary artery due to surrounding nodal tissue. Central pulmonary arteries patent. No evidence of right heart strain. Heart size upper normal. No pericardial effusion. No visible coronary atherosclerosis. No visible atherosclerosis involving the thoracic or upper abdominal aorta or their visualized branches. Mediastinum/Nodes: Right hilar lymphadenopathy which surrounds and compresses the right middle lobe pulmonary artery. No pathologic mediastinal, left hilar or axillary lymphadenopathy. Normal appearing esophagus. Thyroid gland normal in appearance. Lungs/Pleura: Numerous bilateral pulmonary nodules. Index right upper lobe nodule measures approximately 1.9 x 1.6 cm, mean 1.8 cm (series 7, image 29). Index lingular nodule adjacent to the major fissure measures approximately 1.7 x 2.2 cm, mean 2.0 cm (series 7, image 59). Peribronchial and possible lymphangitic spread of tumor in the right lung and left lower lobe. Small left pleural effusion. No right pleural effusion. Dense atelectasis dependently in the lower lobes. Upper Abdomen: Unremarkable for the early arterial phase of enhancement which accounts for the heterogeneous appearance of the spleen. Musculoskeletal: Regional skeleton intact without acute or significant osseous abnormality. Specifically, no evidence of osseous metastatic disease. Review of the MIP images confirms the above findings. IMPRESSION: 1. Acute pulmonary emboli involving  segmental branches of the right upper lobe and right middle lobe pulmonary artery. There is no evidence of right heart strain. 2. Right hilar lymphadenopathy which surrounds and narrows the right middle lobe pulmonary artery. 3. Numerous bilateral pulmonary metastases. Index lesions in the right upper lobe and lingula are measured above. 4. Peribronchial and possible lymphangitic spread of tumor in the right lung and in the left lower lobe. 5. Small left pleural  effusion. 6. Dependent dense atelectasis in the lower lobes. Electronically Signed   By: Evangeline Dakin M.D.   On: 01/23/2017 14:14   Ct Abdomen Pelvis W Contrast  Result Date: 01/29/2017 CLINICAL DATA:  Fever, tachycardia. EXAM: CT ANGIOGRAPHY CHEST CT ABDOMEN AND PELVIS WITH CONTRAST TECHNIQUE: Multidetector CT imaging of the chest was performed using the standard protocol during bolus administration of intravenous contrast. Multiplanar CT image reconstructions and MIPs were obtained to evaluate the vascular anatomy. Multidetector CT imaging of the abdomen and pelvis was performed using the standard protocol during bolus administration of intravenous contrast. CONTRAST:  100 mL of Isovue 370 intravenously. COMPARISON:  CT scan of Jan 21, 2017. FINDINGS: CTA CHEST FINDINGS Cardiovascular: Satisfactory opacification of the pulmonary arteries to the segmental level. No evidence of pulmonary embolism. Normal heart size. No pericardial effusion. Mediastinum/Nodes: No enlarged mediastinal, hilar, or axillary lymph nodes. Thyroid gland, trachea, and esophagus demonstrate no significant findings. Lungs/Pleura: Multiple nodular densities are noted throughout both lungs consistent with metastatic disease. Minimal bibasilar subsegmental atelectasis is noted. No pneumothorax or pleural effusion is noted. Musculoskeletal: No chest wall abnormality. No acute or significant osseous findings. Review of the MIP images confirms the above findings. CT ABDOMEN and PELVIS FINDINGS Hepatobiliary: The gallstones are noted. Stable hepatic lesions are noted consistent with metastatic disease. Pancreas: Unremarkable. No pancreatic ductal dilatation or surrounding inflammatory changes. Spleen: Normal in size without focal abnormality. Adrenals/Urinary Tract: Adrenal glands appear normal. No hydronephrosis or renal obstruction is noted. No renal or ureteral calculi are noted. Urinary bladder is unremarkable. Stable complex low density  is noted posteriorly in the left renal cortex concerning for infection, infarction or possibly metastatic disease. Stomach/Bowel: There is no evidence of bowel obstruction. 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 Vascular/Lymphatic: No significant vascular abnormality is noted. Left periaortic adenopathy is noted consistent with metastatic disease. The largest measures 15 mm. Reproductive: Reportedly, both ovaries have been removed. Probable fibroid uterus is noted. Continued presence of inflammatory changes seen around the uterus in the pelvis with multiple small fluid collections, the largest measuring 33 x 21 mm. These do not appear to be significantly changed compared to prior exam. Other: Surgical drain remains in the subhepatic space. No fluid collection is noted around this drain. Continued and stable presence of pigtail drainage catheter seen in the right lower quadrant with no significant fluid collection noted around it. Midline surgical incision is noted. Musculoskeletal: No acute or significant osseous findings. Review of the MIP images confirms the above findings. IMPRESSION: No definite evidence of pulmonary embolus. Continued presence of multiple pulmonary and hepatic metastatic lesions. Stable complex low density seen posteriorly in left renal cortex concerning for infection, infarction or possibly metastatic lesion. Left periaortic adenopathy is noted with largest lymph node measuring 15 mm. This is consistent with metastatic disease. Continued presence of surgical drain seen in subhepatic space and pigtail drainage catheter in the right lower quadrant, with no significant fluid collection is noted around either catheter. Continued presence of inflammatory changes in the pelvis adjacent to the fibroid uterus,  with multiple small complex fluid collections which are not significantly changed compared to prior exam. Electronically Signed   By: Marijo Conception, M.D.   On: 01/29/2017 20:38   Ct  Abdomen Pelvis W Contrast  Result Date: 01/21/2017 CLINICAL DATA:  Tachycardia and leukocytosis with history of intra-abdominal abscess after rupture of bowel. Positive for cancer although etiology is unknown. EXAM: CT ABDOMEN AND PELVIS WITH CONTRAST TECHNIQUE: Multidetector CT imaging of the abdomen and pelvis was performed using the standard protocol following bolus administration of intravenous contrast. CONTRAST:  163m ISOVUE-300 IOPAMIDOL (ISOVUE-300) INJECTION 61% COMPARISON:  01/08/2017 FINDINGS: Lower chest: Interval increase in size and number of pulmonary nodular opacities within the visualized lung bases, example series 4, image 11 in the right middle lobe now measuring 11 mm versus 5 mm on prior exam. No effusion or pneumothorax. Normal visualized cardiac chamber size. No pericardial effusion. Hepatobiliary: Interval increase in size and number of peripherally enhancing hepatic lesions, new adjacent to the gallbladder fossa on series 2, image 30 measuring 10 mm and slightly larger dominant lesion in the left hepatic lobe estimated at 17 x 13 mm versus approximately 14 mm previously. Gallbladder is physiologically distended without calculi. Minimal intrahepatic ductal dilatation in the left hepatic lobe adjacent to the dominant presumed metastatic lesion. Adjacent to the right hepatic lobe along its caudal aspect is a 16 mm circumscribed hypodensity, smaller in appearance than on prior which had measured approximately 21 mm in diameter. Small abscess or necrotic metastasis might account for this appearance, series 2, image 40. Pancreas: Unremarkable. No pancreatic ductal dilatation or surrounding inflammatory changes. Spleen: Normal in size without focal abnormality. Adrenals/Urinary Tract: Normal bilateral adrenal glands and right kidney. New hypodensity in the interpolar aspect of the left kidney measuring 14 x 12 mm. Differential possibilities may include a renal neoplasm, focus of infection,  metastasis or possibly infarct, series 2, image 32. No nephrolithiasis nor obstructive uropathy. The urinary bladder is physiologically distended. Stomach/Bowel: Physiologic distention of the stomach. Normal small bowel rotation. Left lower quadrant colostomy. Status post partial resection sigmoid. Vascular/Lymphatic: Small mesenteric nodules are noted in the mid pelvis measuring up to 7 mm short axis and may represent reactive or metastatic adenopathy. The aorta and iliac vessels are unremarkable. Reproductive: Heterogeneous enhancement of uterus consistent with fibroids. Reportly, both ovaries have been removed. Other: Inflammatory/phlegmonous change about the uterus at site of prior pelvic abscesses. These collections are more thick-walled in appearance and minimally larger on the right measuring 30 x 14 mm versus 30 x 10 mm previously. 11 x 5 mm left-sided thick-walled abscess versus 24 x 15 mm previously. Percutaneous pigtail drainage catheter from right lower quadrant approach is noted coiled in the right hemipelvis anteriorly as before. Musculoskeletal: No acute or significant osseous findings. IMPRESSION: 1. Interval increase in size and number of pulmonary and hepatic lesions consistent with worsening metastatic disease. 2. New ill-defined 14 x 12 mm interpolar hypodensity associated with the left kidney which may reflect a focus of infection, infarct, or neoplasm/metastasis. 3. Phlegmonous change in the pelvis adjacent to the uterus with slightly thicker walled presumed abscess collections minimally larger on the right and smaller on the left. A pigtail percutaneous drainage catheter is seen just anterior to the larger right-sided collection. 4. Smaller circumscribed subhepatic 16 mm hypodensity versus 21 mm previously potentially representing a small abscess or necrotic metastasis. 5. Small subcentimeter mesenteric lymph nodes are noted as above described measuring up to 7 mm. Reactive versus metastatic.  Electronically Signed  By: Ashley Royalty M.D.   On: 01/21/2017 15:37   Ct Abdomen Pelvis W Contrast  Result Date: 01/08/2017 CLINICAL DATA:  Diverticular abscess. EXAM: CT ABDOMEN AND PELVIS WITH CONTRAST TECHNIQUE: Multidetector CT imaging of the abdomen and pelvis was performed using the standard protocol following bolus administration of intravenous contrast. CONTRAST:  55m ISOVUE-300 IOPAMIDOL (ISOVUE-300) INJECTION 61% COMPARISON:  CT scan of Jan 05, 2017. FINDINGS: Lower chest: Bibasilar pulmonary nodules are noted concerning for metastatic disease. Mild bibasilar subsegmental atelectasis is noted as well. Hepatobiliary: Probable sludge is noted within the gallbladder. Stable 1.4 cm low density with irregular margins is noted in the left hepatic lobe which is not changed compared to prior exam. This is concerning for possible metastatic focus. Pancreas: Unremarkable. No pancreatic ductal dilatation or surrounding inflammatory changes. Spleen: Normal in size without focal abnormality. Adrenals/Urinary Tract: Adrenal glands are unremarkable. Kidneys are normal, without renal calculi, focal lesion, or hydronephrosis. Bladder is unremarkable. Stomach/Bowel: Visualized esophagus and stomach appear normal. Mildly dilated proximal small bowel loops are noted concerning for ileus. 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 Status post partial resection of sigmoid colon. Vascular/Lymphatic: No significant vascular findings are present. No enlarged abdominal or pelvic lymph nodes. Reproductive: Multiple small uterine fibroids are noted. Reportedly both ovaries have been removed. Other: There is been interval placement of percutaneous drain into pelvic abscess described on prior exam. The abscess is significantly decreased in size, although it measures 4.3 x 1.8 cm superiorly with 2.9 x 1.0 cm fluid collections seen to the right of the uterus and 2.4 x 1.5 cm fluid collection seen to the left of the uterus. Stable  position of surgical drain is seen entering the right anterior abdominal wall, with distal tip in the right lower quadrant. Musculoskeletal: No acute or significant osseous findings. IMPRESSION: Bibasilar pulmonary nodules are again noted concerning for metastatic disease. Probable sludge seen within the gallbladder. Stable 1.4 cm low density noted in left hepatic lobe concerning for possible metastatic disease. 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 Interval placement of percutaneous drain into the pelvic abscess described on prior exam. This abscess is significantly smaller compared to prior exam. Stable position of surgical drain seen involving right side of abdomen. Electronically Signed   By: JMarijo Conception M.D.   On: 01/08/2017 09:08   Nm Pulmonary Perf And Vent  Result Date: 01/23/2017 CLINICAL DATA:  Tachycardia and sepsis.  Recent surgeries. EXAM: NUCLEAR MEDICINE VENTILATION - PERFUSION LUNG SCAN TECHNIQUE: Ventilation images were obtained in multiple projections using inhaled aerosol Tc-979mTPA. Perfusion images were obtained in multiple projections after intravenous injection of Tc-9943mA. RADIOPHARMACEUTICALS:  34.2 MCi Technetium-23m7mA aerosol inhalation and 4.4 mCi Technetium-23m 27mIV COMPARISON:  Chest radiograph from today FINDINGS: Ventilation: No focal ventilation defect. Perfusion: There are 2 large segmental perfusion defects involving the right upper and lower lobe. Within the superior segment of left lower lobe there is a moderate size segmental perfusion defect. IMPRESSION: High probability for acute pulmonary embolus. Electronically Signed   By: TayloKerby Moors   On: 01/23/2017 12:22   Us VeKoreaus Img Lower Bilateral  Result Date: 01/23/2017 CLINICAL DATA:  Acute pulmonary emboli.  Evaluate for source. EXAM: BILATERAL LOWER EXTREMITY VENOUS DOPPLER ULTRASOUND TECHNIQUE: Gray-scale sonography with graded compression, as well as color Doppler and duplex ultrasound were  performed to evaluate the lower extremity deep venous systems from the level of the common femoral vein and including the common femoral, femoral, profunda femoral, popliteal  and calf veins including the posterior tibial, peroneal and gastrocnemius veins when visible. The superficial great saphenous vein was also interrogated. Spectral Doppler was utilized to evaluate flow at rest and with distal augmentation maneuvers in the common femoral, femoral and popliteal veins. COMPARISON:  None. FINDINGS: RIGHT LOWER EXTREMITY Common Femoral Vein: No evidence of thrombus. Normal compressibility, respiratory phasicity and response to augmentation. Saphenofemoral Junction: No evidence of thrombus. Normal compressibility and flow on color Doppler imaging. Profunda Femoral Vein: No evidence of thrombus. Normal compressibility and flow on color Doppler imaging. Femoral Vein: No evidence of thrombus. Normal compressibility, respiratory phasicity and response to augmentation. Popliteal Vein: No evidence of thrombus. Normal compressibility, respiratory phasicity and response to augmentation. Calf Veins: No evidence of thrombus. Normal compressibility and flow on color Doppler imaging. Superficial Great Saphenous Vein: Not evaluated. Venous Reflux:  Not evaluated. Other Findings:  None. LEFT LOWER EXTREMITY Common Femoral Vein: No evidence of thrombus. Normal compressibility, respiratory phasicity and response to augmentation. Saphenofemoral Junction: No evidence of thrombus. Normal compressibility and flow on color Doppler imaging. Profunda Femoral Vein: No evidence of thrombus. Normal compressibility and flow on color Doppler imaging. Femoral Vein: No evidence of thrombus. Normal compressibility, respiratory phasicity and response to augmentation. Popliteal Vein: No evidence of thrombus. Normal compressibility, respiratory phasicity and response to augmentation. Calf Veins: Occlusive thrombus within one of the paired posterior  tibial veins. The paired peroneal veins are patent. Superficial Great Saphenous Vein: Not evaluated. Venous Reflux:  Not evaluated. Other Findings: Left popliteal fossa cyst measuring approximately 4.6 x 1.2 x 1.9 cm. IMPRESSION: 1. Occlusive thrombus involving one of the paired left posterior tibial veins. 2. No evidence of DVT elsewhere in the left lower extremity. 3. No evidence of right lower extremity DVT. 4. Baker's cyst involving the left popliteal fossa, measured above. Electronically Signed   By: Evangeline Dakin M.D.   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  1.  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 Given patient's known abscess, ongoing active infection, and multiple admissions for sepsis, chemotherapy which potentially could cause immunosuppression is not recommended and contraindicated. Given patient has rapidly progressive disease, she will require treatment and therefore will proceed with immunotherapy using Keytruda today. She does not have any other treatment options at this time. This was denied by insurance, so therefore we are attempting to obtain compassionate care use. 2. Abscess: Continue IV antibiotics as prescribed by infectious disease. Per ID, no immunosuppressive drugs should be used. It may take several months for her abscess to resolved. Drain in place. 3. PE: Continue Eliquis prescribed. 4. Disposition: Return to clinic in 1 week for laboratory work and then in 3 weeks for consideration of cycle 2 of immunotherapy. Continue IV antibiotics at home per home health.   Patient expressed understanding and was in agreement with this plan. She also understands that She can call clinic at any time with any questions, concerns, or complaints.   Cancer Staging No matching staging information was found for the  patient.  Lloyd Huger, MD   02/04/2017 11:47 PM

## 2017-02-05 ENCOUNTER — Telehealth: Payer: Self-pay | Admitting: *Deleted

## 2017-02-05 ENCOUNTER — Inpatient Hospital Stay: Payer: Managed Care, Other (non HMO)

## 2017-02-05 ENCOUNTER — Ambulatory Visit (INDEPENDENT_AMBULATORY_CARE_PROVIDER_SITE_OTHER): Payer: Managed Care, Other (non HMO) | Admitting: Surgery

## 2017-02-05 ENCOUNTER — Encounter: Payer: Self-pay | Admitting: Surgery

## 2017-02-05 ENCOUNTER — Inpatient Hospital Stay (HOSPITAL_BASED_OUTPATIENT_CLINIC_OR_DEPARTMENT_OTHER): Payer: Managed Care, Other (non HMO) | Admitting: Oncology

## 2017-02-05 VITALS — BP 103/70 | HR 114 | Temp 99.4°F | Ht 63.0 in | Wt 116.2 lb

## 2017-02-05 VITALS — BP 104/72 | HR 116 | Temp 99.6°F | Resp 18 | Wt 116.3 lb

## 2017-02-05 DIAGNOSIS — F17210 Nicotine dependence, cigarettes, uncomplicated: Secondary | ICD-10-CM

## 2017-02-05 DIAGNOSIS — R531 Weakness: Secondary | ICD-10-CM | POA: Diagnosis not present

## 2017-02-05 DIAGNOSIS — C787 Secondary malignant neoplasm of liver and intrahepatic bile duct: Secondary | ICD-10-CM | POA: Diagnosis not present

## 2017-02-05 DIAGNOSIS — Z933 Colostomy status: Secondary | ICD-10-CM

## 2017-02-05 DIAGNOSIS — Z800 Family history of malignant neoplasm of digestive organs: Secondary | ICD-10-CM | POA: Diagnosis not present

## 2017-02-05 DIAGNOSIS — C801 Malignant (primary) neoplasm, unspecified: Secondary | ICD-10-CM

## 2017-02-05 DIAGNOSIS — N739 Female pelvic inflammatory disease, unspecified: Secondary | ICD-10-CM

## 2017-02-05 DIAGNOSIS — K651 Peritoneal abscess: Secondary | ICD-10-CM

## 2017-02-05 DIAGNOSIS — R509 Fever, unspecified: Secondary | ICD-10-CM | POA: Diagnosis not present

## 2017-02-05 DIAGNOSIS — J90 Pleural effusion, not elsewhere classified: Secondary | ICD-10-CM | POA: Diagnosis not present

## 2017-02-05 DIAGNOSIS — K631 Perforation of intestine (nontraumatic): Secondary | ICD-10-CM

## 2017-02-05 DIAGNOSIS — L0291 Cutaneous abscess, unspecified: Secondary | ICD-10-CM | POA: Diagnosis not present

## 2017-02-05 DIAGNOSIS — I2699 Other pulmonary embolism without acute cor pulmonale: Secondary | ICD-10-CM

## 2017-02-05 DIAGNOSIS — Z79899 Other long term (current) drug therapy: Secondary | ICD-10-CM

## 2017-02-05 DIAGNOSIS — K449 Diaphragmatic hernia without obstruction or gangrene: Secondary | ICD-10-CM | POA: Diagnosis not present

## 2017-02-05 DIAGNOSIS — A419 Sepsis, unspecified organism: Secondary | ICD-10-CM | POA: Diagnosis not present

## 2017-02-05 DIAGNOSIS — Z86711 Personal history of pulmonary embolism: Secondary | ICD-10-CM | POA: Diagnosis not present

## 2017-02-05 DIAGNOSIS — R590 Localized enlarged lymph nodes: Secondary | ICD-10-CM

## 2017-02-05 DIAGNOSIS — R5383 Other fatigue: Secondary | ICD-10-CM

## 2017-02-05 DIAGNOSIS — C799 Secondary malignant neoplasm of unspecified site: Secondary | ICD-10-CM

## 2017-02-05 DIAGNOSIS — C7800 Secondary malignant neoplasm of unspecified lung: Secondary | ICD-10-CM

## 2017-02-05 LAB — CULTURE, BLOOD (ROUTINE X 2)
CULTURE: NO GROWTH
CULTURE: NO GROWTH
SPECIAL REQUESTS: ADEQUATE

## 2017-02-05 LAB — CBC WITH DIFFERENTIAL/PLATELET
Basophils Absolute: 0 10*3/uL (ref 0–0.1)
Basophils Relative: 0 %
Eosinophils Absolute: 0.1 10*3/uL (ref 0–0.7)
Eosinophils Relative: 1 %
HEMATOCRIT: 22.4 % — AB (ref 35.0–47.0)
HEMOGLOBIN: 7.3 g/dL — AB (ref 12.0–16.0)
LYMPHS ABS: 1.1 10*3/uL (ref 1.0–3.6)
LYMPHS PCT: 4 %
MCH: 28.2 pg (ref 26.0–34.0)
MCHC: 32.8 g/dL (ref 32.0–36.0)
MCV: 86 fL (ref 80.0–100.0)
Monocytes Absolute: 1.2 10*3/uL — ABNORMAL HIGH (ref 0.2–0.9)
Monocytes Relative: 4 %
NEUTROS ABS: 27.1 10*3/uL — AB (ref 1.4–6.5)
NEUTROS PCT: 91 %
Platelets: 774 10*3/uL — ABNORMAL HIGH (ref 150–440)
RBC: 2.61 MIL/uL — AB (ref 3.80–5.20)
RDW: 17.1 % — ABNORMAL HIGH (ref 11.5–14.5)
WBC: 29.5 10*3/uL — ABNORMAL HIGH (ref 3.6–11.0)

## 2017-02-05 LAB — COMPREHENSIVE METABOLIC PANEL
ALK PHOS: 91 U/L (ref 38–126)
ALT: 31 U/L (ref 14–54)
AST: 46 U/L — ABNORMAL HIGH (ref 15–41)
Albumin: 2.3 g/dL — ABNORMAL LOW (ref 3.5–5.0)
Anion gap: 10 (ref 5–15)
BUN: 13 mg/dL (ref 6–20)
CALCIUM: 8.2 mg/dL — AB (ref 8.9–10.3)
CO2: 25 mmol/L (ref 22–32)
CREATININE: 0.5 mg/dL (ref 0.44–1.00)
Chloride: 93 mmol/L — ABNORMAL LOW (ref 101–111)
GFR calc non Af Amer: >60 mL/min (ref >60)
Glucose, Bld: 154 mg/dL — ABNORMAL HIGH (ref 65–99)
Potassium: 3.7 mmol/L (ref 3.5–5.1)
SODIUM: 128 mmol/L — AB (ref 135–145)
Total Bilirubin: 0.3 mg/dL (ref 0.3–1.2)
Total Protein: 6.5 g/dL (ref 6.5–8.1)

## 2017-02-05 LAB — IRON AND TIBC
IRON: 9 ug/dL — AB (ref 28–170)
Saturation Ratios: 6 % — ABNORMAL LOW (ref 10.4–31.8)
TIBC: 155 ug/dL — ABNORMAL LOW (ref 250–450)
UIBC: 146 ug/dL

## 2017-02-05 LAB — FERRITIN: Ferritin: 629 ng/mL — ABNORMAL HIGH (ref 11–307)

## 2017-02-05 LAB — C-REACTIVE PROTEIN: CRP: 22 mg/dL — ABNORMAL HIGH (ref <1.0)

## 2017-02-05 LAB — SAMPLE TO BLOOD BANK

## 2017-02-05 LAB — TSH: TSH: 1.943 u[IU]/mL (ref 0.350–4.500)

## 2017-02-05 MED ORDER — HEPARIN SOD (PORK) LOCK FLUSH 100 UNIT/ML IV SOLN
250.0000 [IU] | Freq: Once | INTRAVENOUS | Status: AC | PRN
  Administered 2017-02-05: 250 [IU]
  Filled 2017-02-05: qty 5

## 2017-02-05 MED ORDER — SODIUM CHLORIDE 0.9 % IV SOLN
Freq: Once | INTRAVENOUS | Status: AC
Start: 2017-02-05 — End: 2017-02-05
  Administered 2017-02-05: 15:00:00 via INTRAVENOUS
  Filled 2017-02-05: qty 1000

## 2017-02-05 MED ORDER — SODIUM CHLORIDE 0.9 % IV SOLN
200.0000 mg | Freq: Once | INTRAVENOUS | Status: AC
  Administered 2017-02-05: 200 mg via INTRAVENOUS
  Filled 2017-02-05: qty 8

## 2017-02-05 NOTE — Progress Notes (Signed)
Patient is here for follow up, she is doing well, no complaints.

## 2017-02-05 NOTE — Telephone Encounter (Signed)
She was scheduled for an antibiotic infusiopn at 330 today and since she is still here she was asking if we could administer it.  Per Dr Grayland Ormond no, cannot do on such short notice. [facility] informed and stated they are going to talk caregiver through giving it when they get home

## 2017-02-05 NOTE — Progress Notes (Signed)
Surgical Clinic Progress/Follow-up Note   HPI:  51 y.o. 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 Patient reports she feels much better, having had another drain removed yesterday prior to her discharge from [facility]. She states she is eager to have this last drain removed and denies subjective fever/chills, N/V, palpitations, CP, or SOB with regular +flatus and +BM's WNL.  Review of Systems:  Constitutional: denies any other weight loss, fever, chills, or sweats  Eyes: denies any other vision changes, history of eye injury  ENT: denies sore throat, hearing problems  Respiratory: denies shortness of breath, wheezing  Cardiovascular: denies chest pain, palpitations  Gastrointestinal: abdominal pain, N/V, and bowel function as per HPI Musculoskeletal: denies any other joint pains or cramps  Skin: Denies any other rashes or skin discolorations  Neurological: denies any other headache, dizziness, weakness  Psychiatric: denies any other depression, anxiety  All other review of systems: otherwise negative   Vital Signs:  BP 103/70   Pulse (!) 114   Temp 99.4 F (37.4 C) (Oral)   Ht 5\' 3"  (1.6 m)   Wt 116 lb 3.2 oz (52.7 kg)   LMP 03/01/2015 Comment: history of bilat ovary removal  BMI 20.58 kg/m    Physical Exam:  Constitutional:  -- Normal body habitus  -- Awake, alert, and oriented x3  Eyes:  -- Pupils equally round and reactive to light  -- No scleral icterus  Ear, nose, throat:  -- No jugular venous distension  -- No nasal drainage, bleeding Pulmonary:  -- No crackles  -- No dullness to percussion  Cardiovascular:  -- S1, S2 present  -- No pericardial  rubs  Gastrointestinal:  -- Soft, nontender, nondistended, no guarding/rebound -- midline VAC dressing well-secured without surrounding erythema, JP x1 well-secured with only 10 mL of clear non-bloody, non-purulent serous fluid in the drain over the past 48 hours -- No abdominal masses appreciated, pulsatile or otherwise  Musculoskeletal / Integumentary:  -- Wounds or skin discoloration: None appreciated except post-surgical wounds as described above  -- Extremities: B/L UE and LE FROM, hands and feet warm, no edema  Neurologic:  -- Motor function: intact and symmetric  -- Sensation: intact and symmetric   Laboratory studies:  CBC Latest Ref Rng & Units 02/05/2017 02/04/2017 02/02/2017  WBC 3.6 - 11.0 K/uL 29.5(H) 24.5(H) 19.2(H)  Hemoglobin 12.0 - 16.0 g/dL 7.3(L) 7.3(L) 7.7(L)  Hematocrit 35.0 - 47.0 % 22.4(L) 22.6(L) 23.6(L)  Platelets 150 - 440 K/uL 774(H) 789(H) 632(H)   BMP:  Lab Results  Component Value Date   GLUCOSE 154 (H) 02/05/2017   CO2 25 02/05/2017   BUN 13 02/05/2017   CREATININE 0.50 02/05/2017   CALCIUM 8.2 (L) 02/05/2017     Assessment:  51 y.o. yo Female with a problem list including...  Patient Active Problem List   Diagnosis Date Noted  . Pelvic abscess in female   . Acute pulmonary embolism ([place]) 01/24/2017  . Intra-abdominal abscess ([place]) 01/23/2017  . Leukocytosis 01/23/2017  . Hypokalemia 01/23/2017  . Hyponatremia 01/23/2017  . Hypomagnesemia 01/23/2017  . Drug eruption 01/23/2017  . Sinus tachycardia 01/23/2017  . Carcinoma of unknown primary ([place]) 01/22/2017  . Sepsis ([place]) 01/21/2017  . Phlegmonous peritonitis ([place])   . Pelvic pain  in female 01/13/2017  . Large bowel perforation ([place]) 12/31/2016  . Perforation of sigmoid colon ([place])   . Abscess of abdominal cavity ([place])   . Dehydration, moderate 12/30/2016  . Metastatic adenocarcinoma ([place]) 12/20/2016  . Heme positive stool 08/01/2014    presents to clinic follow-up evaluation and  possible removal of last remaining pelvic drain placed in the context of partial colectomy and omentectomy with creation of end colostomy for perforated sigmoid colon with widely metastatic poorly differentiated adenocarcinoma of unclear primary etiology and chronic low-grade fever, leukocytosis, and tachycardia, feeling subjectively better since returning home following discharge home from [facility] and removal of her other remaining pelvic JP drain yesterday.  Plan:   - final pelvic drain removed, 2" x 2" gauze dressing and adhesive tape dressing applied  - continue chronic antibiotics as per ID and immunotherapy (scheduled for first dose today) as per oncology  - surgical follow-up with patient's operative surgeon Dr. Dahlia Byes on July 11th as scheduled with our office  - continue MWF abdominal wound VAC dressing changes by home health  All of the above recommendations were discussed with the patient and patient's family, and all of patient's and family's questions were answered to their expressed satisfaction.  -- Marilynne Drivers Rosana Hoes, MD, [place]: [facility] - Partnering for exceptional care. Office: [phone]

## 2017-02-05 NOTE — Patient Instructions (Signed)
Please follow-up on 03/05/17 as scheduled with Dr. Dahlia Byes.

## 2017-02-07 ENCOUNTER — Encounter: Payer: Self-pay | Admitting: Internal Medicine

## 2017-02-07 LAB — CULTURE, BLOOD (ROUTINE X 2)
CULTURE: NO GROWTH
Culture: NO GROWTH

## 2017-02-12 ENCOUNTER — Other Ambulatory Visit: Payer: Self-pay

## 2017-02-12 ENCOUNTER — Inpatient Hospital Stay: Payer: Managed Care, Other (non HMO)

## 2017-02-12 DIAGNOSIS — C799 Secondary malignant neoplasm of unspecified site: Secondary | ICD-10-CM

## 2017-02-12 DIAGNOSIS — C801 Malignant (primary) neoplasm, unspecified: Secondary | ICD-10-CM

## 2017-02-12 LAB — MAGNESIUM: Magnesium: 2 mg/dL (ref 1.7–2.4)

## 2017-02-12 LAB — CBC WITH DIFFERENTIAL/PLATELET
Basophils Absolute: 0 10*3/uL (ref 0–0.1)
Basophils Relative: 0 %
EOS ABS: 0.3 10*3/uL (ref 0–0.7)
Eosinophils Relative: 1 %
HEMATOCRIT: 20.4 % — AB (ref 35.0–47.0)
Hemoglobin: 6.8 g/dL — ABNORMAL LOW (ref 12.0–16.0)
LYMPHS ABS: 0.7 10*3/uL — AB (ref 1.0–3.6)
Lymphocytes Relative: 2 %
MCH: 27.4 pg (ref 26.0–34.0)
MCHC: 33.1 g/dL (ref 32.0–36.0)
MCV: 82.9 fL (ref 80.0–100.0)
MONO ABS: 1.3 10*3/uL — AB (ref 0.2–0.9)
Monocytes Relative: 4 %
NEUTROS PCT: 93 %
Neutro Abs: 31.1 10*3/uL — ABNORMAL HIGH (ref 1.4–6.5)
Platelets: 696 10*3/uL — ABNORMAL HIGH (ref 150–440)
RBC: 2.46 MIL/uL — AB (ref 3.80–5.20)
RDW: 17.5 % — AB (ref 11.5–14.5)
Smear Review: INCREASED
WBC: 33.4 10*3/uL — AB (ref 3.6–11.0)

## 2017-02-12 LAB — COMPREHENSIVE METABOLIC PANEL
ALK PHOS: 117 U/L (ref 38–126)
ALT: 24 U/L (ref 14–54)
ANION GAP: 8 (ref 5–15)
AST: 24 U/L (ref 15–41)
Albumin: 2 g/dL — ABNORMAL LOW (ref 3.5–5.0)
BILIRUBIN TOTAL: 0.4 mg/dL (ref 0.3–1.2)
BUN: 9 mg/dL (ref 6–20)
CALCIUM: 7.9 mg/dL — AB (ref 8.9–10.3)
CO2: 31 mmol/L (ref 22–32)
CREATININE: 0.54 mg/dL (ref 0.44–1.00)
Chloride: 92 mmol/L — ABNORMAL LOW (ref 101–111)
GFR calc Af Amer: >60 mL/min (ref >60)
GFR calc non Af Amer: >60 mL/min (ref >60)
Glucose, Bld: 107 mg/dL — ABNORMAL HIGH (ref 65–99)
Potassium: 2.8 mmol/L — ABNORMAL LOW (ref 3.5–5.1)
SODIUM: 131 mmol/L — AB (ref 135–145)
Total Protein: 5.7 g/dL — ABNORMAL LOW (ref 6.5–8.1)

## 2017-02-12 LAB — SAMPLE TO BLOOD BANK

## 2017-02-12 LAB — ABO/RH: ABO/RH(D): A POS

## 2017-02-13 ENCOUNTER — Other Ambulatory Visit: Payer: Self-pay | Admitting: *Deleted

## 2017-02-13 ENCOUNTER — Other Ambulatory Visit: Payer: Self-pay | Admitting: Oncology

## 2017-02-13 DIAGNOSIS — D649 Anemia, unspecified: Secondary | ICD-10-CM

## 2017-02-13 LAB — PREPARE RBC (CROSSMATCH)

## 2017-02-14 ENCOUNTER — Inpatient Hospital Stay: Payer: Managed Care, Other (non HMO)

## 2017-02-14 DIAGNOSIS — C801 Malignant (primary) neoplasm, unspecified: Secondary | ICD-10-CM | POA: Diagnosis not present

## 2017-02-14 DIAGNOSIS — D649 Anemia, unspecified: Secondary | ICD-10-CM

## 2017-02-14 MED ORDER — SODIUM CHLORIDE 0.9% FLUSH
10.0000 mL | INTRAVENOUS | Status: DC | PRN
  Filled 2017-02-14: qty 10

## 2017-02-14 MED ORDER — ACETAMINOPHEN 325 MG PO TABS
650.0000 mg | ORAL_TABLET | Freq: Once | ORAL | Status: AC
  Administered 2017-02-14: 650 mg via ORAL
  Filled 2017-02-14: qty 2

## 2017-02-14 MED ORDER — DIPHENHYDRAMINE HCL 50 MG/ML IJ SOLN
25.0000 mg | Freq: Once | INTRAMUSCULAR | Status: AC
  Administered 2017-02-14: 25 mg via INTRAVENOUS
  Filled 2017-02-14: qty 1

## 2017-02-14 MED ORDER — SODIUM CHLORIDE 0.9 % IV SOLN
250.0000 mL | Freq: Once | INTRAVENOUS | Status: AC
  Administered 2017-02-14: 250 mL via INTRAVENOUS
  Filled 2017-02-14: qty 250

## 2017-02-14 MED ORDER — HEPARIN SOD (PORK) LOCK FLUSH 100 UNIT/ML IV SOLN: 500.0000 [IU] | Freq: Every day | INTRAVENOUS | Status: DC | PRN

## 2017-02-14 NOTE — Progress Notes (Signed)
Dr. Grayland Ormond made aware of Anna [name] systolic pressure in the 14'N throughout her blood transfusion. Pt is currently on Cardizem and Metoprolol at home. She is asymptomatic. Informed patient to report any signs to her physician of low blood pressure. She was also told to let her primary physician know of her BP just in case they would like adjust her dose.

## 2017-02-15 LAB — TYPE AND SCREEN
ABO/RH(D): A POS
Antibody Screen: NEGATIVE
Unit division: 0
Unit division: 0

## 2017-02-15 LAB — BPAM RBC
Blood Product Expiration Date: 201806282359
Blood Product Expiration Date: 201806282359
ISSUE DATE / TIME: 201806220933
ISSUE DATE / TIME: 201806221135
UNIT TYPE AND RH: 600
Unit Type and Rh: 6200

## 2017-02-24 ENCOUNTER — Encounter: Payer: Self-pay | Admitting: Internal Medicine

## 2017-02-27 ENCOUNTER — Other Ambulatory Visit: Payer: Managed Care, Other (non HMO)

## 2017-02-27 ENCOUNTER — Ambulatory Visit
Admission: RE | Admit: 2017-02-27 | Discharge: 2017-02-27 | Disposition: A | Discharge status: Home / Self Care | Payer: Managed Care, Other (non HMO) | Source: Ambulatory Visit | Attending: Internal Medicine | Admitting: Internal Medicine

## 2017-02-27 ENCOUNTER — Inpatient Hospital Stay: Payer: Managed Care, Other (non HMO)

## 2017-02-27 ENCOUNTER — Ambulatory Visit: Payer: Managed Care, Other (non HMO) | Admitting: Internal Medicine

## 2017-02-27 ENCOUNTER — Inpatient Hospital Stay: Payer: Managed Care, Other (non HMO) | Attending: Internal Medicine

## 2017-02-27 ENCOUNTER — Inpatient Hospital Stay (HOSPITAL_BASED_OUTPATIENT_CLINIC_OR_DEPARTMENT_OTHER): Payer: Managed Care, Other (non HMO) | Admitting: Internal Medicine

## 2017-02-27 ENCOUNTER — Other Ambulatory Visit: Payer: Self-pay | Admitting: *Deleted

## 2017-02-27 ENCOUNTER — Ambulatory Visit: Payer: Managed Care, Other (non HMO)

## 2017-02-27 ENCOUNTER — Telehealth: Payer: Self-pay | Admitting: Pharmacist

## 2017-02-27 VITALS — BP 92/61 | HR 88

## 2017-02-27 VITALS — BP 87/58 | HR 87 | Temp 98.6°F | Wt 121.5 lb

## 2017-02-27 DIAGNOSIS — K449 Diaphragmatic hernia without obstruction or gangrene: Secondary | ICD-10-CM | POA: Diagnosis not present

## 2017-02-27 DIAGNOSIS — R5383 Other fatigue: Secondary | ICD-10-CM

## 2017-02-27 DIAGNOSIS — R112 Nausea with vomiting, unspecified: Secondary | ICD-10-CM | POA: Diagnosis not present

## 2017-02-27 DIAGNOSIS — Z79899 Other long term (current) drug therapy: Secondary | ICD-10-CM

## 2017-02-27 DIAGNOSIS — Z86010 Personal history of colonic polyps: Secondary | ICD-10-CM

## 2017-02-27 DIAGNOSIS — R0602 Shortness of breath: Secondary | ICD-10-CM

## 2017-02-27 DIAGNOSIS — C7800 Secondary malignant neoplasm of unspecified lung: Secondary | ICD-10-CM | POA: Insufficient documentation

## 2017-02-27 DIAGNOSIS — Z7901 Long term (current) use of anticoagulants: Secondary | ICD-10-CM | POA: Diagnosis not present

## 2017-02-27 DIAGNOSIS — J90 Pleural effusion, not elsewhere classified: Secondary | ICD-10-CM | POA: Insufficient documentation

## 2017-02-27 DIAGNOSIS — A689 Relapsing fever, unspecified: Secondary | ICD-10-CM

## 2017-02-27 DIAGNOSIS — Z800 Family history of malignant neoplasm of digestive organs: Secondary | ICD-10-CM | POA: Diagnosis not present

## 2017-02-27 DIAGNOSIS — D649 Anemia, unspecified: Secondary | ICD-10-CM | POA: Diagnosis not present

## 2017-02-27 DIAGNOSIS — R630 Anorexia: Secondary | ICD-10-CM | POA: Insufficient documentation

## 2017-02-27 DIAGNOSIS — N739 Female pelvic inflammatory disease, unspecified: Secondary | ICD-10-CM

## 2017-02-27 DIAGNOSIS — N1330 Unspecified hydronephrosis: Secondary | ICD-10-CM | POA: Insufficient documentation

## 2017-02-27 DIAGNOSIS — C799 Secondary malignant neoplasm of unspecified site: Secondary | ICD-10-CM

## 2017-02-27 DIAGNOSIS — C801 Malignant (primary) neoplasm, unspecified: Secondary | ICD-10-CM

## 2017-02-27 DIAGNOSIS — I959 Hypotension, unspecified: Secondary | ICD-10-CM

## 2017-02-27 DIAGNOSIS — M7989 Other specified soft tissue disorders: Secondary | ICD-10-CM | POA: Insufficient documentation

## 2017-02-27 DIAGNOSIS — E876 Hypokalemia: Secondary | ICD-10-CM | POA: Insufficient documentation

## 2017-02-27 DIAGNOSIS — C787 Secondary malignant neoplasm of liver and intrahepatic bile duct: Secondary | ICD-10-CM | POA: Insufficient documentation

## 2017-02-27 DIAGNOSIS — D72829 Elevated white blood cell count, unspecified: Secondary | ICD-10-CM | POA: Insufficient documentation

## 2017-02-27 DIAGNOSIS — R590 Localized enlarged lymph nodes: Secondary | ICD-10-CM

## 2017-02-27 DIAGNOSIS — Z5112 Encounter for antineoplastic immunotherapy: Secondary | ICD-10-CM | POA: Insufficient documentation

## 2017-02-27 DIAGNOSIS — I82890 Acute embolism and thrombosis of other specified veins: Secondary | ICD-10-CM | POA: Insufficient documentation

## 2017-02-27 LAB — COMPREHENSIVE METABOLIC PANEL
ALBUMIN: 1.9 g/dL — AB (ref 3.5–5.0)
ALT: 30 U/L (ref 14–54)
AST: 41 U/L (ref 15–41)
Alkaline Phosphatase: 141 U/L — ABNORMAL HIGH (ref 38–126)
Anion gap: 8 (ref 5–15)
BUN: 7 mg/dL (ref 6–20)
CHLORIDE: 95 mmol/L — AB (ref 101–111)
CO2: 30 mmol/L (ref 22–32)
CREATININE: 0.71 mg/dL (ref 0.44–1.00)
Calcium: 7.8 mg/dL — ABNORMAL LOW (ref 8.9–10.3)
GFR calc non Af Amer: >60 mL/min (ref >60)
Glucose, Bld: 154 mg/dL — ABNORMAL HIGH (ref 65–99)
Potassium: 2.6 mmol/L — CL (ref 3.5–5.1)
SODIUM: 133 mmol/L — AB (ref 135–145)
Total Bilirubin: 0.5 mg/dL (ref 0.3–1.2)
Total Protein: 5.2 g/dL — ABNORMAL LOW (ref 6.5–8.1)

## 2017-02-27 LAB — CBC WITH DIFFERENTIAL/PLATELET
BASOS ABS: 0.1 10*3/uL (ref 0–0.1)
BASOS PCT: 0 %
EOS ABS: 0.2 10*3/uL (ref 0–0.7)
EOS PCT: 0 %
HCT: 24.2 % — ABNORMAL LOW (ref 35.0–47.0)
Hemoglobin: 7.8 g/dL — ABNORMAL LOW (ref 12.0–16.0)
Lymphocytes Relative: 1 %
Lymphs Abs: 0.8 10*3/uL — ABNORMAL LOW (ref 1.0–3.6)
MCH: 26.8 pg (ref 26.0–34.0)
MCHC: 32.2 g/dL (ref 32.0–36.0)
MCV: 83 fL (ref 80.0–100.0)
Monocytes Absolute: 1.3 10*3/uL — ABNORMAL HIGH (ref 0.2–0.9)
Monocytes Relative: 3 %
Neutro Abs: 50.2 10*3/uL — ABNORMAL HIGH (ref 1.4–6.5)
Neutrophils Relative %: 96 %
PLATELETS: 719 10*3/uL — AB (ref 150–440)
RBC: 2.92 MIL/uL — AB (ref 3.80–5.20)
RDW: 17.8 % — ABNORMAL HIGH (ref 11.5–14.5)
WBC: 52.6 10*3/uL — AB (ref 3.6–11.0)

## 2017-02-27 LAB — C-REACTIVE PROTEIN: CRP: 22.9 mg/dL — ABNORMAL HIGH (ref <1.0)

## 2017-02-27 MED ORDER — POTASSIUM CHLORIDE 2 MEQ/ML IV SOLN
Freq: Once | INTRAVENOUS | Status: AC
  Administered 2017-02-27: 13:00:00 via INTRAVENOUS
  Filled 2017-02-27: qty 15

## 2017-02-27 MED ORDER — SODIUM CHLORIDE 0.9 % IV SOLN
Freq: Once | INTRAVENOUS | Status: AC
  Administered 2017-02-27: 13:00:00 via INTRAVENOUS
  Filled 2017-02-27: qty 1000

## 2017-02-27 MED ORDER — ONDANSETRON HCL 40 MG/20ML IJ SOLN
Freq: Once | INTRAMUSCULAR | Status: AC
  Administered 2017-02-27: 15:00:00 via INTRAVENOUS
  Filled 2017-02-27: qty 4

## 2017-02-27 MED ORDER — IOPAMIDOL (ISOVUE-300) INJECTION 61%
100.0000 mL | Freq: Once | INTRAVENOUS | Status: AC | PRN
  Administered 2017-02-27: 100 mL via INTRAVENOUS

## 2017-02-27 MED ORDER — HEPARIN SOD (PORK) LOCK FLUSH 100 UNIT/ML IV SOLN
250.0000 [IU] | Freq: Once | INTRAVENOUS | Status: AC | PRN
  Administered 2017-02-27: 250 [IU]

## 2017-02-27 MED ORDER — SODIUM CHLORIDE 0.9 % IV SOLN
200.0000 mg | Freq: Once | INTRAVENOUS | Status: AC
  Administered 2017-02-27: 200 mg via INTRAVENOUS
  Filled 2017-02-27: qty 8

## 2017-02-27 MED ORDER — POTASSIUM CHLORIDE CRYS ER 20 MEQ PO TBCR: 20.0000 meq | EXTENDED_RELEASE_TABLET | Freq: Two times a day (BID) | ORAL | 0 refills | Status: AC

## 2017-02-27 NOTE — Progress Notes (Signed)
[place] OFFICE PROGRESS NOTE  Patient Care Team: McLean-Scocuzza, Nino Glow, MD as PCP - General (Internal Medicine) Clent Jacks, RN as Registered Nurse  Cancer Staging No matching staging information was found for the patient.   Oncology History   # MAY 2018- CARCINOMA of unknown primary [PDL-1 50%]   # 02/06/2017- [id number]   # MAY 2018- PELVIC ABCESS/COLON PERF [Dr.Pabon; [place]]; June 2018 PE on eliquis.      Carcinoma of unknown primary [facility])   INTERVAL HISTORY:  Anna [name] 51 y.o.  female pleasant patient above history of Carcinoma unknown primary- currently on palliative care on [place] the status post cycle #1 approximately 3 weeks ago is here for follow-up.  Patient tolerated the treatment fairly well. No diarrhea no skin rash.  Patient continues to be on IV antibiotics through PICC line- meropenem 3 times a day. She was evaluated by ID 5 days ago. About 3 days ago patient had a fever of 10 4 at night. Intermittent chills. Patient has been taking Tylenol for fevers. Denies any burning urination. Denies any worsening cough. Chronic shortness of breath. Fatigue.  Patient complains of mild swelling of the right upper extremity. Denies any significant pain. Has a PICC line on that side.    REVIEW OF SYSTEMS:  A complete 10 point review of system is done which is negative except mentioned above/history of present illness.   PAST MEDICAL HISTORY :  Past Medical History:  Diagnosis Date  . Cancer ([place])   . Cancer with unknown primary site [facility])   . Colon polyp   . Diverticulosis   . Fibroid uterus   . Heme positive stool 08/01/2014  . HH (hiatus hernia)   . Large bowel perforation ([place]) 12/31/2016  . Metastatic adenocarcinoma ([place]) 12/20/2016  . Pelvic pain in female 01/13/2017  . Perforation of sigmoid colon ([place])     PAST SURGICAL HISTORY :   Past Surgical History:  Procedure Laterality Date  . COLONOSCOPY    . CYSTOSCOPY   12/20/2016   Procedure: CYSTOSCOPY;  Surgeon: Boykin Nearing, MD;  Location: [facility] ORS;  Service: Gynecology;;  . ESOPHAGOGASTRODUODENOSCOPY    . LAPAROSCOPIC BILATERAL SALPINGECTOMY Bilateral 12/20/2016   Procedure: LAPAROSCOPIC BILATERAL SALPINGOOPHERECTOMY,  Excision of anterior 5 cm calcified uterine fibroid;  Surgeon: Boykin Nearing, MD;  Location: [facility] ORS;  Service: Gynecology;  Laterality: Bilateral;  . LAPAROSCOPIC LYSIS OF ADHESIONS  12/20/2016   Procedure: LAPAROSCOPIC LYSIS OF ADHESIONS WITH DISSECTION OF LEFT TUBE AND OVARY.;  Surgeon: Olean Ree, MD;  Location: [facility] ORS;  Service: General;;  . LAPAROTOMY N/A 12/31/2016   Procedure: EXPLORATORY LAPAROTOMY, sigmoid colectomy, removal of omentum, colostomy;  Surgeon: Jules Husbands, MD;  Location: [facility] ORS;  Service: General;  Laterality: N/A;  . SIGMOIDOSCOPY  12/20/2016   Procedure: RIGID SIGMOIDOSCOPY;  Surgeon: Olean Ree, MD;  Location: [facility] ORS;  Service: General;;  . TONSILLECTOMY      FAMILY HISTORY :   Family History  Problem Relation Age of Onset  . Colon cancer Mother 56  . Diabetes Mother   . Asthma Father   . Breast cancer Neg Hx     SOCIAL HISTORY:   Social History  Substance Use Topics  . Smoking status: Never Smoker  . Smokeless tobacco: Never Used  . Alcohol use No    ALLERGIES:  is allergic to cefepime and penicillins.  MEDICATIONS:  Current Outpatient Prescriptions  Medication Sig Dispense Refill  . acetaminophen (TYLENOL) 500 MG  tablet Take 500 mg by mouth as needed for mild pain.    Marland Kitchen apixaban (ELIQUIS) 5 MG TABS tablet Take 1 tablet (5 mg total) by mouth 2 (two) times daily. 60 tablet 0  . diltiazem (CARDIZEM CD) 240 MG 24 hr capsule Take 1 capsule (240 mg total) by mouth daily. 30 capsule 0  . diphenhydrAMINE (BENADRYL) 25 mg capsule Take 1 capsule (25 mg total) by mouth every 6 (six) hours as needed (Rash). 60 capsule 0  . ferrous sulfate 325 (65 FE) MG tablet Take 1 tablet (325 mg  total) by mouth daily with breakfast. 30 tablet 0  . meropenem 1 g in sodium chloride 0.9 % 100 mL Inject 1 g into the vein every 8 (eight) hours. 42 Dose 1  . metoprolol tartrate (LOPRESSOR) 25 MG tablet Take 1 tablet (25 mg total) by mouth 3 (three) times daily after meals. 90 tablet 0  . ondansetron (ZOFRAN) 4 MG tablet Take 1 tablet (4 mg total) by mouth every 6 (six) hours as needed for nausea. (Patient not taking: Reported on 02/27/2017) 20 tablet 0  . oxyCODONE-acetaminophen (ROXICET) 5-325 MG tablet Take 1 tablet by mouth every 6 (six) hours as needed. (Patient not taking: Reported on 02/27/2017) 20 tablet 0   No current facility-administered medications for this visit.    Facility-Administered Medications Ordered in Other Visits  Medication Dose Route Frequency Provider Last Rate Last Dose  . heparin lock flush 100 unit/mL  250 Units Intracatheter Once PRN Cammie Sickle, MD      . ondansetron (ZOFRAN) 8 mg in sodium chloride 0.9 % 50 mL IVPB   Intravenous Once Cammie Sickle, MD      . pembrolizumab [facility]) 200 mg in sodium chloride 0.9 % 50 mL chemo infusion  200 mg Intravenous Once Cammie Sickle, MD        PHYSICAL EXAMINATION: ECOG PERFORMANCE STATUS: 1 - Symptomatic but completely ambulatory  BP (!) 87/58 (BP Location: Left Arm, Patient Position: Sitting)   Pulse 87   Temp 98.6 F (37 C) (Tympanic)   Wt 121 lb 8 oz (55.1 kg)   LMP 03/01/2015 Comment: history of bilat ovary removal  BMI 21.52 kg/m   Filed Weights   02/27/17 1136 02/27/17 1141  Weight: 121 lb 8 oz (55.1 kg) 121 lb 8 oz (55.1 kg)    GENERAL: moderately nourished well-developed; Alert, no distress and comfortable.   With her husband.  EYES: no pallor or icterus OROPHARYNX: no thrush or ulceration; good dentition  NECK: supple, no masses felt LYMPH:  no palpable lymphadenopathy in the cervical, axillary or inguinal regions LUNGS: clear to auscultation and  No wheeze or  crackles HEART/CVS: regular rate & rhythm and no murmurs; No lower extremity edema ABDOMEN:abdomen soft, non-tender and normal bowel sounds; patient has wound VAC draining well. Musculoskeletal:no cyanosis of digits and no clubbing  PSYCH: alert & oriented x 3 with fluent speech NEURO: no focal motor/sensory deficits SKIN:  no rashes or significant lesions  LABORATORY DATA:  I have reviewed the data as listed    Component Value Date/Time   NA 133 (L) 02/27/2017 1053   K 2.6 (LL) 02/27/2017 1053   CL 95 (L) 02/27/2017 1053   CO2 30 02/27/2017 1053   GLUCOSE 154 (H) 02/27/2017 1053   BUN 7 02/27/2017 1053   CREATININE 0.71 02/27/2017 1053   CALCIUM 7.8 (L) 02/27/2017 1053   PROT 5.2 (L) 02/27/2017 1053   ALBUMIN 1.9 (L) 02/27/2017 1053  AST 41 02/27/2017 1053   ALT 30 02/27/2017 1053   ALKPHOS 141 (H) 02/27/2017 1053   BILITOT 0.5 02/27/2017 1053   GFRNONAA >60 02/27/2017 1053   GFRAA >60 02/27/2017 1053    No results found for: SPEP, UPEP  Lab Results  Component Value Date   WBC 52.6 (HH) 02/27/2017   NEUTROABS 50.2 (H) 02/27/2017   HGB 7.8 (L) 02/27/2017   HCT 24.2 (L) 02/27/2017   MCV 83.0 02/27/2017   PLT 719 (H) 02/27/2017      Chemistry      Component Value Date/Time   NA 133 (L) 02/27/2017 1053   K 2.6 (LL) 02/27/2017 1053   CL 95 (L) 02/27/2017 1053   CO2 30 02/27/2017 1053   BUN 7 02/27/2017 1053   CREATININE 0.71 02/27/2017 1053      Component Value Date/Time   CALCIUM 7.8 (L) 02/27/2017 1053   ALKPHOS 141 (H) 02/27/2017 1053   AST 41 02/27/2017 1053   ALT 30 02/27/2017 1053   BILITOT 0.5 02/27/2017 1053       RADIOGRAPHIC STUDIES: I have personally reviewed the radiological images as listed and agreed with the findings in the report. No results found.   ASSESSMENT & PLAN:  Carcinoma of unknown primary ([place]) # CUP- PDL-1 50%. S/p keytruda #1. Tolerated fairly well. Proceed with cycle #2 today. [C discussion below]. At a long discussion with  the patient and her husband regarding the difficult situation with proceeding with immunotherapy especially the context of her worsening infection [white count 52,000]  # Intermittent fevers /leukocytosis - given history of Pelvic Abcess on Meropenem- suspect underlying infection. Check blood cultures CT of the chest and pelvis with contrast. Discussed with Dr. Ola Spurr.  # hypotension- question dehydration/sepsis. Recommend IVFs.   # Severe hypokalemia potassium 2.8/ poor by mouth intake- recommend IV potassium and also potassium supplementation at home.  # Right upper extremity swelling- question DVT versus others. Patient is already on anticoagulation/atelectasis given history of PE.   # Patient follow-up with me approximately 1 week/cbc-bmp. Discussed with Dr.Fiztgerald.   Addendum: Patient getting CT scans this evening; if significant changes in the abscess noted- recommend evaluation for surgery/IR.   # 40 minutes face-to-face with the patient discussing the above plan of care; more than 50% of time spent on prognosis/ natural history; counseling and coordination.   Orders Placed This Encounter  Procedures  . CT ABDOMEN PELVIS W CONTRAST    Last meal: 9am     Standing Status:   Future    Standing Expiration Date:   05/29/2018    Order Specific Question:   Reason for Exam (SYMPTOM  OR DIAGNOSIS REQUIRED)    Answer:   fevers; pelvic  abcess    Order Specific Question:   Is the patient pregnant?    Answer:   No    Order Specific Question:   Preferred imaging location?    Answer:   [facility]    Order Specific Question:   Call Results- Best Contact Number?    Answer:   [phone] please hold patient   . CT CHEST W CONTRAST    Standing Status:   Future    Standing Expiration Date:   04/29/2018    Order Specific Question:   Reason for Exam (SYMPTOM  OR DIAGNOSIS REQUIRED)    Answer:   fevers; pelvic  abcess    Order Specific Question:   Is the patient pregnant?    Answer:    No  Order Specific Question:   Preferred imaging location?    Answer:   [facility]    Order Specific Question:   Call Results- Best Contact Number?    Answer:   [phone] please hold patient   . US Venous Img Upper Uni Right    Standing Status:   Future    Standing Expiration Date:   02/27/2018    Order Specific Question:   Reason for Exam (SYMPTOM  OR DIAGNOSIS REQUIRED)    Answer:   Right upper extremity edema near picc line    Order Specific Question:   Preferred imaging location?    Answer:   [facility]    Order Specific Question:   Call Results- Best Contact Number?    Answer:   [phone] please hold patient    All questions were answered. The patient knows to call the clinic with any problems, questions or concerns.      Cammie Sickle, MD 02/27/2017 3:04 PM

## 2017-02-27 NOTE — Assessment & Plan Note (Addendum)
#  CUP- PDL-1 50%. S/p keytruda #1. Tolerated fairly well. Proceed with cycle #2 today. [C discussion below]. At a long discussion with the patient and her husband regarding the difficult situation with proceeding with immunotherapy especially the context of her worsening infection [white count 52,000]  # Intermittent fevers /leukocytosis - given history of Pelvic Abcess on Meropenem- suspect underlying infection. Check blood cultures CT of the chest and pelvis with contrast. Discussed with Dr. Ola Spurr.  # hypotension- question dehydration/sepsis. Recommend IVFs.   # Severe hypokalemia potassium 2.8/ poor by mouth intake- recommend IV potassium and also potassium supplementation at home.  # Right upper extremity swelling- question DVT versus others. Patient is already on anticoagulation/atelectasis given history of PE.   # Patient follow-up with me approximately 1 week/cbc-bmp. Discussed with Dr.Fiztgerald.   Addendum: Patient getting CT scans this evening; if significant changes in the abscess noted- recommend evaluation for surgery/IR.   # 40 minutes face-to-face with the patient discussing the above plan of care; more than 50% of time spent on prognosis/ natural history; counseling and coordination.

## 2017-02-27 NOTE — Telephone Encounter (Signed)
Pharmacy gave patient free Keytruda with last infusion and with infusion today. Per LuAnn Chrismon, Patient's Beryle Flock has been denied by insurance  because Dr. Rao(ordering physician) was not credentialed, the insurance company still has it in review and we cannot finish up with pharmaceutical company for assistance with free drug until after insurance company is done.   We used free drug sent previously for other patients that have since discontinued therapy.

## 2017-02-27 NOTE — Progress Notes (Signed)
1127 am - rcvd phone call from [facility] in [place] center lab- critical potassium 2.6. Read back process performed with lab tech. Dr. Rogue Bussing informed of critical value at 1133 am. Read back process performed with md.

## 2017-02-27 NOTE — Progress Notes (Signed)
Patient here today for follow up.  Patient c/o right arm edema that started last night, also c/o of cough with clear phlegm

## 2017-02-28 ENCOUNTER — Inpatient Hospital Stay (HOSPITAL_BASED_OUTPATIENT_CLINIC_OR_DEPARTMENT_OTHER): Payer: Managed Care, Other (non HMO) | Admitting: Internal Medicine

## 2017-02-28 ENCOUNTER — Other Ambulatory Visit: Payer: Self-pay | Admitting: *Deleted

## 2017-02-28 ENCOUNTER — Ambulatory Visit
Admission: RE | Admit: 2017-02-28 | Discharge: 2017-02-28 | Disposition: A | Discharge status: Home / Self Care | Payer: Managed Care, Other (non HMO) | Source: Ambulatory Visit | Attending: Internal Medicine | Admitting: Internal Medicine

## 2017-02-28 ENCOUNTER — Inpatient Hospital Stay: Payer: Managed Care, Other (non HMO)

## 2017-02-28 ENCOUNTER — Telehealth: Payer: Self-pay | Admitting: Internal Medicine

## 2017-02-28 ENCOUNTER — Inpatient Hospital Stay
Admission: AD | Admit: 2017-02-28 | Discharge: 2017-03-04 | DRG: 871 | Disposition: A | Discharge status: Home Health | Payer: Managed Care, Other (non HMO) | Source: Ambulatory Visit | Attending: Internal Medicine | Admitting: Internal Medicine

## 2017-02-28 VITALS — BP 140/94 | HR 86 | Temp 97.8°F | Wt 126.2 lb

## 2017-02-28 DIAGNOSIS — Z86718 Personal history of other venous thrombosis and embolism: Secondary | ICD-10-CM | POA: Diagnosis not present

## 2017-02-28 DIAGNOSIS — Z800 Family history of malignant neoplasm of digestive organs: Secondary | ICD-10-CM

## 2017-02-28 DIAGNOSIS — K449 Diaphragmatic hernia without obstruction or gangrene: Secondary | ICD-10-CM | POA: Diagnosis present

## 2017-02-28 DIAGNOSIS — A419 Sepsis, unspecified organism: Secondary | ICD-10-CM | POA: Diagnosis present

## 2017-02-28 DIAGNOSIS — Z515 Encounter for palliative care: Secondary | ICD-10-CM

## 2017-02-28 DIAGNOSIS — J90 Pleural effusion, not elsewhere classified: Secondary | ICD-10-CM

## 2017-02-28 DIAGNOSIS — Z7901 Long term (current) use of anticoagulants: Secondary | ICD-10-CM | POA: Diagnosis not present

## 2017-02-28 DIAGNOSIS — D259 Leiomyoma of uterus, unspecified: Secondary | ICD-10-CM | POA: Diagnosis present

## 2017-02-28 DIAGNOSIS — Z933 Colostomy status: Secondary | ICD-10-CM | POA: Diagnosis not present

## 2017-02-28 DIAGNOSIS — Y828 Other medical devices associated with adverse incidents: Secondary | ICD-10-CM | POA: Diagnosis present

## 2017-02-28 DIAGNOSIS — N739 Female pelvic inflammatory disease, unspecified: Secondary | ICD-10-CM | POA: Diagnosis not present

## 2017-02-28 DIAGNOSIS — Z7189 Other specified counseling: Secondary | ICD-10-CM

## 2017-02-28 DIAGNOSIS — Z833 Family history of diabetes mellitus: Secondary | ICD-10-CM

## 2017-02-28 DIAGNOSIS — D638 Anemia in other chronic diseases classified elsewhere: Secondary | ICD-10-CM | POA: Diagnosis present

## 2017-02-28 DIAGNOSIS — E876 Hypokalemia: Secondary | ICD-10-CM | POA: Diagnosis not present

## 2017-02-28 DIAGNOSIS — R630 Anorexia: Secondary | ICD-10-CM | POA: Diagnosis not present

## 2017-02-28 DIAGNOSIS — T82818A Embolism of vascular prosthetic devices, implants and grafts, initial encounter: Secondary | ICD-10-CM | POA: Diagnosis present

## 2017-02-28 DIAGNOSIS — M7989 Other specified soft tissue disorders: Secondary | ICD-10-CM

## 2017-02-28 DIAGNOSIS — R509 Fever, unspecified: Secondary | ICD-10-CM | POA: Diagnosis present

## 2017-02-28 DIAGNOSIS — K651 Peritoneal abscess: Secondary | ICD-10-CM | POA: Diagnosis present

## 2017-02-28 DIAGNOSIS — Z880 Allergy status to penicillin: Secondary | ICD-10-CM

## 2017-02-28 DIAGNOSIS — C799 Secondary malignant neoplasm of unspecified site: Secondary | ICD-10-CM | POA: Diagnosis not present

## 2017-02-28 DIAGNOSIS — C7800 Secondary malignant neoplasm of unspecified lung: Secondary | ICD-10-CM

## 2017-02-28 DIAGNOSIS — R590 Localized enlarged lymph nodes: Secondary | ICD-10-CM | POA: Diagnosis present

## 2017-02-28 DIAGNOSIS — D649 Anemia, unspecified: Secondary | ICD-10-CM | POA: Diagnosis not present

## 2017-02-28 DIAGNOSIS — Z452 Encounter for adjustment and management of vascular access device: Secondary | ICD-10-CM

## 2017-02-28 DIAGNOSIS — Z5112 Encounter for antineoplastic immunotherapy: Secondary | ICD-10-CM | POA: Diagnosis not present

## 2017-02-28 DIAGNOSIS — D72829 Elevated white blood cell count, unspecified: Secondary | ICD-10-CM

## 2017-02-28 DIAGNOSIS — C787 Secondary malignant neoplasm of liver and intrahepatic bile duct: Secondary | ICD-10-CM | POA: Diagnosis present

## 2017-02-28 DIAGNOSIS — I959 Hypotension, unspecified: Secondary | ICD-10-CM | POA: Diagnosis not present

## 2017-02-28 DIAGNOSIS — Z881 Allergy status to other antibiotic agents status: Secondary | ICD-10-CM | POA: Diagnosis not present

## 2017-02-28 DIAGNOSIS — C801 Malignant (primary) neoplasm, unspecified: Secondary | ICD-10-CM

## 2017-02-28 DIAGNOSIS — K5790 Diverticulosis of intestine, part unspecified, without perforation or abscess without bleeding: Secondary | ICD-10-CM | POA: Diagnosis present

## 2017-02-28 DIAGNOSIS — R531 Weakness: Secondary | ICD-10-CM | POA: Diagnosis not present

## 2017-02-28 DIAGNOSIS — Z79899 Other long term (current) drug therapy: Secondary | ICD-10-CM | POA: Diagnosis not present

## 2017-02-28 DIAGNOSIS — Z825 Family history of asthma and other chronic lower respiratory diseases: Secondary | ICD-10-CM | POA: Diagnosis not present

## 2017-02-28 DIAGNOSIS — Z66 Do not resuscitate: Secondary | ICD-10-CM | POA: Diagnosis not present

## 2017-02-28 DIAGNOSIS — N1330 Unspecified hydronephrosis: Secondary | ICD-10-CM | POA: Diagnosis present

## 2017-02-28 DIAGNOSIS — R109 Unspecified abdominal pain: Secondary | ICD-10-CM | POA: Diagnosis not present

## 2017-02-28 DIAGNOSIS — Z8719 Personal history of other diseases of the digestive system: Secondary | ICD-10-CM | POA: Diagnosis not present

## 2017-02-28 DIAGNOSIS — I8290 Acute embolism and thrombosis of unspecified vein: Secondary | ICD-10-CM

## 2017-02-28 DIAGNOSIS — Z86010 Personal history of colonic polyps: Secondary | ICD-10-CM

## 2017-02-28 DIAGNOSIS — R5383 Other fatigue: Secondary | ICD-10-CM

## 2017-02-28 DIAGNOSIS — R0602 Shortness of breath: Secondary | ICD-10-CM | POA: Diagnosis not present

## 2017-02-28 DIAGNOSIS — Z9221 Personal history of antineoplastic chemotherapy: Secondary | ICD-10-CM | POA: Diagnosis not present

## 2017-02-28 DIAGNOSIS — Z86711 Personal history of pulmonary embolism: Secondary | ICD-10-CM | POA: Diagnosis present

## 2017-02-28 DIAGNOSIS — R112 Nausea with vomiting, unspecified: Secondary | ICD-10-CM | POA: Diagnosis not present

## 2017-02-28 DIAGNOSIS — I82890 Acute embolism and thrombosis of other specified veins: Secondary | ICD-10-CM | POA: Diagnosis present

## 2017-02-28 DIAGNOSIS — I82401 Acute embolism and thrombosis of unspecified deep veins of right lower extremity: Secondary | ICD-10-CM | POA: Diagnosis not present

## 2017-02-28 LAB — COMPREHENSIVE METABOLIC PANEL
ALBUMIN: 1.8 g/dL — AB (ref 3.5–5.0)
ALK PHOS: 143 U/L — AB (ref 38–126)
ALT: 29 U/L (ref 14–54)
AST: 39 U/L (ref 15–41)
Anion gap: 9 (ref 5–15)
BUN: 8 mg/dL (ref 6–20)
CALCIUM: 7.9 mg/dL — AB (ref 8.9–10.3)
CHLORIDE: 97 mmol/L — AB (ref 101–111)
CO2: 27 mmol/L (ref 22–32)
CREATININE: 0.7 mg/dL (ref 0.44–1.00)
GFR calc non Af Amer: >60 mL/min (ref >60)
GLUCOSE: 153 mg/dL — AB (ref 65–99)
Potassium: 3.9 mmol/L (ref 3.5–5.1)
SODIUM: 133 mmol/L — AB (ref 135–145)
Total Bilirubin: 0.5 mg/dL (ref 0.3–1.2)
Total Protein: 5.1 g/dL — ABNORMAL LOW (ref 6.5–8.1)

## 2017-02-28 LAB — CBC
HCT: 25.8 % — ABNORMAL LOW (ref 35.0–47.0)
HEMOGLOBIN: 8 g/dL — AB (ref 12.0–16.0)
MCH: 26.5 pg (ref 26.0–34.0)
MCHC: 30.9 g/dL — ABNORMAL LOW (ref 32.0–36.0)
MCV: 85.9 fL (ref 80.0–100.0)
PLATELETS: 750 10*3/uL — AB (ref 150–440)
RBC: 3 MIL/uL — AB (ref 3.80–5.20)
RDW: 18.2 % — ABNORMAL HIGH (ref 11.5–14.5)
WBC: 56.1 10*3/uL — AB (ref 3.6–11.0)

## 2017-02-28 LAB — URINALYSIS, COMPLETE (UACMP) WITH MICROSCOPIC
BILIRUBIN URINE: NEGATIVE
Bacteria, UA: NONE SEEN
Glucose, UA: NEGATIVE mg/dL
Hgb urine dipstick: NEGATIVE
Ketones, ur: NEGATIVE mg/dL
LEUKOCYTES UA: NEGATIVE
Nitrite: NEGATIVE
PH: 6 (ref 5.0–8.0)
Protein, ur: NEGATIVE mg/dL
SPECIFIC GRAVITY, URINE: 1.011 (ref 1.005–1.030)

## 2017-02-28 LAB — MAGNESIUM: Magnesium: 1.7 mg/dL (ref 1.7–2.4)

## 2017-02-28 MED ORDER — METOPROLOL TARTRATE 25 MG PO TABS
25.0000 mg | ORAL_TABLET | Freq: Three times a day (TID) | ORAL | Status: DC
  Filled 2017-02-28: qty 1

## 2017-02-28 MED ORDER — ONDANSETRON HCL 4 MG PO TABS: 4.0000 mg | ORAL_TABLET | Freq: Four times a day (QID) | ORAL | Status: DC | PRN

## 2017-02-28 MED ORDER — APIXABAN 5 MG PO TABS
5.0000 mg | ORAL_TABLET | Freq: Two times a day (BID) | ORAL | Status: DC
  Administered 2017-02-28 – 2017-03-04 (×8): 5 mg via ORAL
  Filled 2017-02-28 (×8): qty 1

## 2017-02-28 MED ORDER — DIPHENHYDRAMINE HCL 25 MG PO CAPS: 25.0000 mg | ORAL_CAPSULE | Freq: Four times a day (QID) | ORAL | Status: DC | PRN

## 2017-02-28 MED ORDER — SODIUM CHLORIDE 0.9 % IV SOLN
1.0000 g | Freq: Three times a day (TID) | INTRAVENOUS | Status: DC
  Administered 2017-02-28: 1 g via INTRAVENOUS
  Filled 2017-02-28 (×4): qty 1

## 2017-02-28 MED ORDER — VANCOMYCIN HCL IN DEXTROSE 750-5 MG/150ML-% IV SOLN
750.0000 mg | Freq: Two times a day (BID) | INTRAVENOUS | Status: DC
  Administered 2017-03-01 – 2017-03-04 (×7): 750 mg via INTRAVENOUS
  Filled 2017-02-28 (×8): qty 150

## 2017-02-28 MED ORDER — OXYCODONE-ACETAMINOPHEN 5-325 MG PO TABS: 1.0000 | ORAL_TABLET | Freq: Four times a day (QID) | ORAL | Status: DC | PRN

## 2017-02-28 MED ORDER — ENOXAPARIN SODIUM 40 MG/0.4ML ~~LOC~~ SOLN: 40.0000 mg | SUBCUTANEOUS | Status: DC

## 2017-02-28 MED ORDER — ACETAMINOPHEN 500 MG PO TABS: 500.0000 mg | ORAL_TABLET | ORAL | Status: DC | PRN

## 2017-02-28 MED ORDER — FERROUS SULFATE 325 (65 FE) MG PO TABS
325.0000 mg | ORAL_TABLET | Freq: Every day | ORAL | Status: DC
  Administered 2017-03-01 – 2017-03-04 (×4): 325 mg via ORAL
  Filled 2017-02-28 (×4): qty 1

## 2017-02-28 MED ORDER — SODIUM CHLORIDE 0.9 % IV SOLN: 250.0000 mL | INTRAVENOUS | Status: DC | PRN

## 2017-02-28 MED ORDER — ACETAMINOPHEN 650 MG RE SUPP: 650.0000 mg | Freq: Four times a day (QID) | RECTAL | Status: DC | PRN

## 2017-02-28 MED ORDER — DILTIAZEM HCL ER COATED BEADS 240 MG PO CP24
240.0000 mg | ORAL_CAPSULE | Freq: Every day | ORAL | Status: DC
  Filled 2017-02-28 (×2): qty 1

## 2017-02-28 MED ORDER — FLUCONAZOLE IN SODIUM CHLORIDE 200-0.9 MG/100ML-% IV SOLN
200.0000 mg | INTRAVENOUS | Status: DC
  Administered 2017-02-28 – 2017-03-03 (×4): 200 mg via INTRAVENOUS
  Filled 2017-02-28 (×5): qty 100

## 2017-02-28 MED ORDER — ACETAMINOPHEN 325 MG PO TABS
650.0000 mg | ORAL_TABLET | Freq: Four times a day (QID) | ORAL | Status: DC | PRN
  Administered 2017-03-01 – 2017-03-03 (×2): 650 mg via ORAL
  Filled 2017-02-28 (×2): qty 2

## 2017-02-28 MED ORDER — SODIUM CHLORIDE 0.9% FLUSH: 3.0000 mL | INTRAVENOUS | Status: DC | PRN

## 2017-02-28 MED ORDER — ONDANSETRON HCL 4 MG/2ML IJ SOLN: 4.0000 mg | Freq: Four times a day (QID) | INTRAMUSCULAR | Status: DC | PRN

## 2017-02-28 MED ORDER — SODIUM CHLORIDE 0.9% FLUSH: 3.0000 mL | Freq: Two times a day (BID) | INTRAVENOUS | Status: DC

## 2017-02-28 MED ORDER — VANCOMYCIN HCL IN DEXTROSE 1-5 GM/200ML-% IV SOLN
1000.0000 mg | Freq: Once | INTRAVENOUS | Status: AC
  Administered 2017-02-28: 1000 mg via INTRAVENOUS
  Filled 2017-02-28: qty 200

## 2017-02-28 NOTE — Progress Notes (Signed)
RN Contacted Brandy at Dr. Ola Spurr to make aware of patient's admission.  Call report to Misty-rn on 1C. 1555 - pt transported in stable condition via w/c by volunteer to room 111.

## 2017-02-28 NOTE — Assessment & Plan Note (Addendum)
#  CUP- PDL-1 50%. S/p keytruda #1. Tolerated fairly well. S/p cycle #2 yesterday. July 5th CT C/A/P- show progressive disease in the liver; lungs; small bilateral pleural effusion/baseline consolidation; soft tissue mass in the pelvis [C discussion below]  # Long discussion the patient and husband with the concern for progressive disease as noted on the imaging. However, it is too early to evaluate for disease response at this time. If acute issues resolved- we'll plan to proceed with cycle #3 in approximately 3 weeks.  # Fevers leukocytosis of 52,000/no clear etiology of the infectious source noted. Question of left hydronephrosis; discussed with urology. Awaiting a UA urine culture. Discussed with Dr. Ola Spurr; Pilar Jarvis.   # Right upper extremity swelling- PICC line associated DVT. This has been discontinued; patient has a PICC line on the left side.  # Patient and husband were very tearful- her long discussion regarding the overall poor prognosis/incurable nature of the disease. However, her immediate life-threatening issue would be ongoing infection; and this is not treated appropriately this could be fatal. Patient reluctantly agreed to be admitted to the hospital.  # history of PE [june 2018]/ PICC line associated DVT- continuie eliquis.   Also discussed with radiology- no evidence of obvious source of drainable abscess. Discussed with hospitalist physician Dr.Sudini- who kindly agreed to admit the patient. Reviewed with Dr. Janese Banks- : Physician with the weekend.   # I reviewed the blood work- with the patient in detail; also reviewed the imaging independently [as summarized above]; and with the patient in detail.   # 40 minutes face-to-face with the patient discussing the above plan of care; more than 50% of time spent on prognosis/ natural history; counseling and coordination.  # follow-up to be decided based upon  Discharge from the hospital.

## 2017-02-28 NOTE — Progress Notes (Signed)
Pharmacy Antibiotic Note  Anna [name] is a 51 y.o. female admitted on 02/28/2017 with bacteremia.  Pharmacy has been consulted for vancomycin dosing.  Plan: Vancomycin 1000 mg IV x 1 followed in approximately 10 hours (stacked dosing) by vancomycin 750 mg IV Q12H, predicted trough 17 mcg/mL. Pharmacy will continue to follow and adjust as needed to maintain trough 15 to 20 mcg/mL.   Vd 36.3 L, Ke 0.067 hr-1, T1/2 10.4 hr     Temp (24hrs), Avg:98.2 F (36.8 C), Min:97.8 F (36.6 C), Max:98.6 F (37 C)   Recent Labs Lab 02/27/17 1053  WBC 52.6*  CREATININE 0.71    Estimated Creatinine Clearance: 67.3 mL/min (by C-G formula based on SCr of 0.71 mg/dL).    Allergies  Allergen Reactions  . Cefepime Rash  . Penicillins Rash    As a child. Has patient had a PCN reaction causing immediate rash, facial/tongue/throat swelling, SOB or lightheadedness with hypotension: Unknown Has patient had a PCN reaction causing severe rash involving mucus membranes or skin necrosis: Unknown Has patient had a PCN reaction that required hospitalization: No Has patient had a PCN reaction occurring within the last 10 years: No If all of the above answers are "NO", then may proceed with Cephalosporin use.    Thank you for allowing pharmacy to be a part of this patient's care.  Laural Benes, Pharm.D., BCPS Clinical Pharmacist 02/28/2017 4:42 PM

## 2017-02-28 NOTE — Telephone Encounter (Signed)
Received phone call from Anna [name] in [facility]- still waiting on MD to sign the picc line orders. Also needs orders released in epic. RN Spoke with Dr. Rogue Bussing - he is presently signing the orders.

## 2017-02-28 NOTE — Consult Note (Addendum)
02/28/17 5:01 PM   [place] October 14, 1965 [id number]  Referring provider: Dr. Serita Grit  CC: Malignant left hydronephrosis  HPI: The patient is a 51 year old female with a very complex past medical history including pelvic abscess and colon perforation s/p colostomy in May 2018 secondary to a carcinoma of unknown origin who presents to the hospital with intermittent fevers for the last 3-4 weeks. Repeat CT scan performed yesterday shows interval development of mild left hydronephrosis secondary to involvement of the carcinoma/inflammation of the left lower quadrant. There was resolution of previous abscess from prior studies. This left hydronephrosis was not present on a CT scan performed approximately one month ago. Urology was consulted for further evaluation. Her white blood cell count yesterday was approximately 52,000 with previous white blood cell count few weeks ago 33,000. She is currently receiving IV meropenem with a PICC line that had to be exchanged today due to DVT. Blood cultures obtained yesterday are no growth to date. The meropenem was originally started for her intermittent fevers as well as a history of significant infection within her abdomen.  Urology was asked for evaluation of the hydronephrosis on the left side that is mild and developed over the last month. She at this time denies any left flank pain currently or over the last month. She denies dysuria and suprapubic tenderness. She has no history of recurrent urinary tract infections. Outside of the intermittent fevers, she as felt over all fairly well recently.   She has been afebrile with stable vital signs today. Repeat CBC and urine culture is pending from today.  She has eaten recently this afternoon.  PMH: Past Medical History:  Diagnosis Date  . Cancer ([place])   . Cancer with unknown primary site [facility])   . Colon polyp   . Diverticulosis   . Fibroid uterus   . Heme positive stool 08/01/2014  . HH  (hiatus hernia)   . Large bowel perforation ([place]) 12/31/2016  . Metastatic adenocarcinoma ([place]) 12/20/2016  . Pelvic pain in female 01/13/2017  . Perforation of sigmoid colon [facility])     Surgical History: Past Surgical History:  Procedure Laterality Date  . COLONOSCOPY    . CYSTOSCOPY  12/20/2016   Procedure: CYSTOSCOPY;  Surgeon: Boykin Nearing, MD;  Location: [facility] ORS;  Service: Gynecology;;  . ESOPHAGOGASTRODUODENOSCOPY    . LAPAROSCOPIC BILATERAL SALPINGECTOMY Bilateral 12/20/2016   Procedure: LAPAROSCOPIC BILATERAL SALPINGOOPHERECTOMY,  Excision of anterior 5 cm calcified uterine fibroid;  Surgeon: Boykin Nearing, MD;  Location: [facility] ORS;  Service: Gynecology;  Laterality: Bilateral;  . LAPAROSCOPIC LYSIS OF ADHESIONS  12/20/2016   Procedure: LAPAROSCOPIC LYSIS OF ADHESIONS WITH DISSECTION OF LEFT TUBE AND OVARY.;  Surgeon: Olean Ree, MD;  Location: [facility] ORS;  Service: General;;  . LAPAROTOMY N/A 12/31/2016   Procedure: EXPLORATORY LAPAROTOMY, sigmoid colectomy, removal of omentum, colostomy;  Surgeon: Jules Husbands, MD;  Location: [facility] ORS;  Service: General;  Laterality: N/A;  . SIGMOIDOSCOPY  12/20/2016   Procedure: RIGID SIGMOIDOSCOPY;  Surgeon: Olean Ree, MD;  Location: [facility] ORS;  Service: General;;  . TONSILLECTOMY      Allergies:  Allergies  Allergen Reactions  . Cefepime Rash  . Penicillins Rash    As a child. Has patient had a PCN reaction causing immediate rash, facial/tongue/throat swelling, SOB or lightheadedness with hypotension: Unknown Has patient had a PCN reaction causing severe rash involving mucus membranes or skin necrosis: Unknown Has patient had a PCN reaction that required hospitalization: No Has patient  had a PCN reaction occurring within the last 10 years: No If all of the above answers are "NO", then may proceed with Cephalosporin use.     Family History: Family History  Problem Relation Age of Onset  . Colon cancer Mother 35  .  Diabetes Mother   . Asthma Father   . Breast cancer Neg Hx     Social History:  reports that she has never smoked. She has never used smokeless tobacco. She reports that she does not drink alcohol or use drugs.  ROS: 12 point ROS negative except for above  Physical Exam: Ht 5\' 3"  (1.6 m)   Wt 126 lb 6.4 oz (57.3 kg)   LMP 03/01/2015 Comment: history of bilat ovary removal  BMI 22.39 kg/m   Constitutional:  Alert and oriented, No acute distress. HEENT: [place] AT, moist mucus membranes.  Trachea midline, no masses. Cardiovascular: No clubbing, cyanosis, or edema. Respiratory: Normal respiratory effort, no increased work of breathing. GI: Abdomen is soft, nontender, nondistended, no abdominal masses. Ostomy bag and wound vac noted  GU: No CVA tenderness with deep palpation. No SP tenderness Skin: No rashes, bruises or suspicious lesions. Lymph: No cervical or inguinal adenopathy. Neurologic: Grossly intact, no focal deficits, moving all 4 extremities. Psychiatric: Normal mood and affect.  Laboratory Data: Lab Results  Component Value Date   WBC 52.6 (HH) 02/27/2017   HGB 7.8 (L) 02/27/2017   HCT 24.2 (L) 02/27/2017   MCV 83.0 02/27/2017   PLT 719 (H) 02/27/2017    Lab Results  Component Value Date   CREATININE 0.71 02/27/2017    No results found for: PSA  No results found for: TESTOSTERONE  No results found for: HGBA1C  Urinalysis    Component Value Date/Time   COLORURINE AMBER (A) 01/29/2017 1714   APPEARANCEUR TURBID (A) 01/29/2017 1714   LABSPEC 1.020 01/29/2017 1714   PHURINE 6.0 01/29/2017 1714   GLUCOSEU NEGATIVE 01/29/2017 1714   HGBUR NEGATIVE 01/29/2017 1714   BILIRUBINUR NEGATIVE 01/29/2017 1714   KETONESUR 20 (A) 01/29/2017 1714   PROTEINUR 30 (A) 01/29/2017 1714   NITRITE NEGATIVE 01/29/2017 1714   LEUKOCYTESUR NEGATIVE 01/29/2017 1714    Pertinent Imaging: CT reviewed as above  Assessment & Plan:    1. Intermittent fevers 2. Leukocytosis 3.  Left malignant hydronephrosis The patient has a very confusing and complex clinical picture. She has no classic signs of septic obstructive uropathy with a completely negative exam and no urinary symptoms. She also has been spiking fevers intermittently for at least 3 weeks. I would expect that if this new onset mild hydronephrosis over the last month was the source of her intermittent fevers that she would have decompensated much sooner. Since she is currently overall stable and afebrile today, I do not think she needs urgent intervention with a left ureteral stent at this time. She does have urine cultures and a urinalysis pending at this point. If she does have a positive urine culture, it may be worthwhile considering a stent sooner. If she starts developing symptomatic left flank pain which would be more consistent with infected obstructive uropathy, a stent may be needed urgently at that time as well. However, I have very low suspicion that the source of her increased leukocytosis and intermittent fevers for the last 3-4 weeks is secondary to this new left mild hydronephrosis. Regardless, I do think she would benefit from elective left ureteral stent placement at some point to maximize urinary drainage to maintain renal function,  but this is not needed urgently at this time and likely be performed on a scheduled basis as an outpatient.   Nickie Retort, MD  [facility] [address], [place], [place] [address] [phone]

## 2017-02-28 NOTE — Progress Notes (Signed)
[facility] OFFICE PROGRESS NOTE  Patient Care Team: McLean-Scocuzza, Nino Glow, MD as PCP - General (Internal Medicine) Clent Jacks, RN as Registered Nurse  Cancer Staging No matching staging information was found for the patient.   Oncology History   # MAY 2018- CARCINOMA of unknown primary [PDL-1 50%]   # 02/06/2017- EQASTMHD   # MAY 2018- PELVIC ABCESS/COLON PERF [Dr.Pabon; [place]]; June 2018 PE on eliquis.      Carcinoma of unknown primary Anna [name])   INTERVAL HISTORY:  Anna [name] 51 y.o.  female pleasant patient above history of Carcinoma unknown primary- currently on palliative care on [place] the status post cycle #2 Wilburn Mylar is here to review the results of her CT scan.  Patient interim was diagnosed with right upper extremity DVT- had a PICC line taken out; and has a PICC line on the left side today.   Patient denies any diarrhea. Denies any worsening cough. Chronic shortness of breath. Fatigue.   REVIEW OF SYSTEMS: Positive for fatigue. Feels poorly.  A complete 10 point review of system is done which is negative except mentioned above/history of present illness.   PAST MEDICAL HISTORY :  Past Medical History:  Diagnosis Date  . Cancer ([place])   . Cancer with unknown primary site [facility])   . Colon polyp   . Diverticulosis   . Fibroid uterus   . Heme positive stool 08/01/2014  . HH (hiatus hernia)   . Large bowel perforation ([place]) 12/31/2016  . Metastatic adenocarcinoma ([place]) 12/20/2016  . Pelvic pain in female 01/13/2017  . Perforation of sigmoid colon ([place])     PAST SURGICAL HISTORY :   Past Surgical History:  Procedure Laterality Date  . COLONOSCOPY    . CYSTOSCOPY  12/20/2016   Procedure: CYSTOSCOPY;  Surgeon: Boykin Nearing, MD;  Location: [facility] ORS;  Service: Gynecology;;  . ESOPHAGOGASTRODUODENOSCOPY    . LAPAROSCOPIC BILATERAL SALPINGECTOMY Bilateral 12/20/2016   Procedure: LAPAROSCOPIC BILATERAL SALPINGOOPHERECTOMY,   Excision of anterior 5 cm calcified uterine fibroid;  Surgeon: Boykin Nearing, MD;  Location: [facility] ORS;  Service: Gynecology;  Laterality: Bilateral;  . LAPAROSCOPIC LYSIS OF ADHESIONS  12/20/2016   Procedure: LAPAROSCOPIC LYSIS OF ADHESIONS WITH DISSECTION OF LEFT TUBE AND OVARY.;  Surgeon: Olean Ree, MD;  Location: [facility] ORS;  Service: General;;  . LAPAROTOMY N/A 12/31/2016   Procedure: EXPLORATORY LAPAROTOMY, sigmoid colectomy, removal of omentum, colostomy;  Surgeon: Jules Husbands, MD;  Location: [facility] ORS;  Service: General;  Laterality: N/A;  . SIGMOIDOSCOPY  12/20/2016   Procedure: RIGID SIGMOIDOSCOPY;  Surgeon: Olean Ree, MD;  Location: [facility] ORS;  Service: General;;  . TONSILLECTOMY      FAMILY HISTORY :   Family History  Problem Relation Age of Onset  . Colon cancer Mother 71  . Diabetes Mother   . Asthma Father   . Breast cancer Neg Hx     SOCIAL HISTORY:   Social History  Substance Use Topics  . Smoking status: Never Smoker  . Smokeless tobacco: Never Used  . Alcohol use No    ALLERGIES:  is allergic to cefepime and penicillins.  MEDICATIONS:  No current facility-administered medications for this visit.    No current outpatient prescriptions on file.   Facility-Administered Medications Ordered in Other Visits  Medication Dose Route Frequency Provider Last Rate Last Dose  . 0.9 %  sodium chloride infusion  250 mL Intravenous PRN Dustin Flock, MD      . acetaminophen (TYLENOL) tablet  650 mg  650 mg Oral Q6H PRN Dustin Flock, MD       Or  . acetaminophen (TYLENOL) suppository 650 mg  650 mg Rectal Q6H PRN Dustin Flock, MD      . apixaban Arne Cleveland) tablet 5 mg  5 mg Oral BID Dustin Flock, MD      . diltiazem (CARDIZEM CD) 24 hr capsule 240 mg  240 mg Oral Daily Dustin Flock, MD      . diphenhydrAMINE (BENADRYL) capsule 25 mg  25 mg Oral Q6H PRN Dustin Flock, MD      . Derrill Memo ON 03/01/2017] ferrous sulfate tablet 325 mg  325 mg Oral Q breakfast  Dustin Flock, MD      . fluconazole (DIFLUCAN) IVPB 200 mg  200 mg Intravenous Q24H Dustin Flock, MD      . meropenem (MERREM) 1 g in sodium chloride 0.9 % 100 mL IVPB  1 g Intravenous Q8H Dustin Flock, MD 200 mL/hr at 02/28/17 1757 1 g at 02/28/17 1757  . metoprolol tartrate (LOPRESSOR) tablet 25 mg  25 mg Oral TID PC Dustin Flock, MD      . ondansetron [name] tablet 4 mg  4 mg Oral Q6H PRN Dustin Flock, MD       Or  . ondansetron (ZOFRAN) injection 4 mg  4 mg Intravenous Q6H PRN Dustin Flock, MD      . oxyCODONE-acetaminophen (PERCOCET/ROXICET) 5-325 MG per tablet 1-2 tablet  1-2 tablet Oral Q6H PRN Dustin Flock, MD      . sodium chloride flush (NS) 0.9 % injection 3 mL  3 mL Intravenous Q12H Dustin Flock, MD      . sodium chloride flush (NS) 0.9 % injection 3 mL  3 mL Intravenous PRN Dustin Flock, MD      . vancomycin (VANCOCIN) IVPB 1000 mg/200 mL premix  1,000 mg Intravenous Once Dustin Flock, MD      . Derrill Memo ON 03/01/2017] vancomycin (VANCOCIN) IVPB 750 mg/150 ml premix  750 mg Intravenous Q12H Dustin Flock, MD        PHYSICAL EXAMINATION: ECOG PERFORMANCE STATUS: 1 - Symptomatic but completely ambulatory  BP (!) 140/94 (BP Location: Left Leg, Patient Position: Sitting)   Pulse 86   Temp 97.8 F (36.6 C) (Tympanic)   Wt 126 lb 4 oz (57.3 kg)   LMP 03/01/2015 Comment: history of bilat ovary removal  BMI 22.36 kg/m   Filed Weights   02/28/17 1356  Weight: 126 lb 4 oz (57.3 kg)    GENERAL: moderately nourished well-developed; Alert, no distress and comfortable.   With her husband.  EYES: no pallor or icterus OROPHARYNX: no thrush or ulceration; good dentition  NECK: supple, no masses felt LYMPH:  no palpable lymphadenopathy in the cervical, axillary or inguinal regions LUNGS: clear to auscultation and  No wheeze or crackles HEART/CVS: regular rate & rhythm and no murmurs; No lower extremity edema ABDOMEN:abdomen soft, non-tender and normal  bowel sounds; patient has wound VAC draining well. Musculoskeletal:no cyanosis of digits and no clubbing  PSYCH: alert & oriented x 3 with fluent speech NEURO: no focal motor/sensory deficits SKIN:  no rashes or significant lesions  LABORATORY DATA:  I have reviewed the data as listed    Component Value Date/Time   NA 133 (L) 02/27/2017 1053   K 2.6 (LL) 02/27/2017 1053   CL 95 (L) 02/27/2017 1053   CO2 30 02/27/2017 1053   GLUCOSE 154 (H) 02/27/2017 1053   BUN 7 02/27/2017 1053  CREATININE 0.71 02/27/2017 1053   CALCIUM 7.8 (L) 02/27/2017 1053   PROT 5.2 (L) 02/27/2017 1053   ALBUMIN 1.9 (L) 02/27/2017 1053   AST 41 02/27/2017 1053   ALT 30 02/27/2017 1053   ALKPHOS 141 (H) 02/27/2017 1053   BILITOT 0.5 02/27/2017 1053   GFRNONAA >60 02/27/2017 1053   GFRAA >60 02/27/2017 1053    No results found for: SPEP, UPEP  Lab Results  Component Value Date   WBC 52.6 (HH) 02/27/2017   NEUTROABS 50.2 (H) 02/27/2017   HGB 7.8 (L) 02/27/2017   HCT 24.2 (L) 02/27/2017   MCV 83.0 02/27/2017   PLT 719 (H) 02/27/2017      Chemistry      Component Value Date/Time   NA 133 (L) 02/27/2017 1053   K 2.6 (LL) 02/27/2017 1053   CL 95 (L) 02/27/2017 1053   CO2 30 02/27/2017 1053   BUN 7 02/27/2017 1053   CREATININE 0.71 02/27/2017 1053      Component Value Date/Time   CALCIUM 7.8 (L) 02/27/2017 1053   ALKPHOS 141 (H) 02/27/2017 1053   AST 41 02/27/2017 1053   ALT 30 02/27/2017 1053   BILITOT 0.5 02/27/2017 1053       RADIOGRAPHIC STUDIES: I have personally reviewed the radiological images as listed and agreed with the findings in the report. Ct Chest W Contrast  Result Date: 02/27/2017 CLINICAL DATA:  Fever and cough for several weeks. History of carcinoma of unknown primary. Right arm swelling starting last night. EXAM: CT CHEST, ABDOMEN, AND PELVIS WITH CONTRAST TECHNIQUE: Multidetector CT imaging of the chest, abdomen and pelvis was performed following the standard protocol  during bolus administration of intravenous contrast. CONTRAST:  160m ISOVUE-300 IOPAMIDOL (ISOVUE-300) INJECTION 61% COMPARISON:  01/29/2017 FINDINGS: CT CHEST FINDINGS Cardiovascular: Heart size upper normal. No pericardial effusion. Right PICC line tip is positioned at the SVC/RA junction. Mediastinum/Nodes: Scattered small to borderline enlarged mediastinal and hilar lymph nodes are noted bilaterally. There is no hilar lymphadenopathy. The esophagus has normal imaging features. Lungs/Pleura: Small bilateral pleural effusions are new in the interval. 12 mm posterior right upper lobe pleural-based nodule has progressed since prior study. Innumerable ill-defined and irregular pulmonary nodules are again identified. 10 mm left upper lobe nodule image 31 series 4 was 10 mm on the prior study. 13 mm right middle lobe nodule on today's study was 16 mm on the prior study. An 8 mm nodule seen in the lingula (image 84 series 4) was 8 mm previously. There is bilateral dependent collapse/consolidation. Musculoskeletal: Bone windows reveal no worrisome lytic or sclerotic osseous lesions. CT ABDOMEN PELVIS FINDINGS Hepatobiliary: Multiple hepatic lesions are again identified. 3.5 cm lesion in the medial segment left liver was 2.8 cm on the prior study and is more necrotic on today's exam. 2.6 cm lesion in the lateral segment of the left liver was 2.6 cm previously. 3.5 cm lesion inferior aspect of the medial segment left liver, adjacent to the gallbladder measured only 1.6 cm on the prior study. Multiple new liver lesions are seen in the dome of the right liver (compare image 40 series 2 today to image 15 series 11 previously. Gallbladder wall thickening or pericholecystic fluid noted. No intrahepatic or extrahepatic biliary dilation. Pancreas: No focal mass lesion. No dilatation of the main duct. No intraparenchymal cyst. No peripancreatic edema. Spleen: No splenomegaly. No focal mass lesion. Adrenals/Urinary Tract: No  adrenal nodule or mass. Right kidney unremarkable. Asymmetrically decreased perfusion noted to the left kidney and  there is mild left hydroureteronephrosis. Left ureter remains dilated into the pelvis where abnormal soft tissue is identified along the left pelvic sidewall. Stomach/Bowel: Stomach is nondistended. No gastric wall thickening. No evidence of outlet obstruction. Duodenum is normally positioned as is the ligament of Treitz. No small bowel wall thickening. No small bowel dilatation. The terminal ileum is normal. The appendix is normal. No gross colonic mass. No colonic wall thickening. No substantial diverticular change. Left lower quadrant sigmoid end colostomy noted. Vascular/Lymphatic: No abdominal aortic aneurysm. Mild hepatoduodenal ligament lymphadenopathy appears progressed in the interval. Necrotic left periaortic lymph node just caudal to the left renal vein is 15 mm short axis today which compares to 14 mm previously. A second 13 mm left para-aortic lymph node seen on image 66 was 8 mm previously. Reproductive: Irregular and heterogeneous soft tissue again incorporates the uterus as seen on multiple prior studies. Neoplastic involvement is a distinct consideration in this process extends to the sidewall of the pelvis bilaterally in accounts for the evolving left hydroureteronephrosis. Other: Small volume intraperitoneal free fluid. Musculoskeletal: Bone windows reveal no worrisome lytic or sclerotic osseous lesions. IMPRESSION: 1. Aside from the presence of the right-sided PICC line, no IV 6 bone a shin for the patient's reported history of acute onset right upper extremity swelling. There may be some thrombus along the PICC line in the subclavian vein, but this is not well assessed because of the streak artifact from the PICC line itself. 2. Clear interval progression of some of the hepatic metastases. 3. The innumerable irregular pulmonary show no clear trend towards progression or regression.  There is a new pleural-based nodule in the right apex and other nodules measure minimally larger and smaller since prior study. 4. There is new bibasilar collapse/consolidation with new small bilateral pleural effusions. 5. New mild lymphadenopathy in the hepatoduodenal ligament with stable to progression of para-aortic upper abdominal lymphadenopathy. 6. Similar irregular soft tissue in the pelvis involving the uterus with extension to the sidewalls bilaterally. Incorporation of the distal left ureter on the left results and mild left hydroureteronephrosis and decreased perfusion to the left kidney on today's study is consistent with a component of associated obstructive uropathy. Electronically Signed   By: Misty Stanley M.D.   On: 02/27/2017 18:06   Ct Abdomen Pelvis W Contrast  Result Date: 02/27/2017 CLINICAL DATA:  Fever and cough for several weeks. History of carcinoma of unknown primary. Right arm swelling starting last night. EXAM: CT CHEST, ABDOMEN, AND PELVIS WITH CONTRAST TECHNIQUE: Multidetector CT imaging of the chest, abdomen and pelvis was performed following the standard protocol during bolus administration of intravenous contrast. CONTRAST:  146m ISOVUE-300 IOPAMIDOL (ISOVUE-300) INJECTION 61% COMPARISON:  01/29/2017 FINDINGS: CT CHEST FINDINGS Cardiovascular: Heart size upper normal. No pericardial effusion. Right PICC line tip is positioned at the SVC/RA junction. Mediastinum/Nodes: Scattered small to borderline enlarged mediastinal and hilar lymph nodes are noted bilaterally. There is no hilar lymphadenopathy. The esophagus has normal imaging features. Lungs/Pleura: Small bilateral pleural effusions are new in the interval. 12 mm posterior right upper lobe pleural-based nodule has progressed since prior study. Innumerable ill-defined and irregular pulmonary nodules are again identified. 10 mm left upper lobe nodule image 31 series 4 was 10 mm on the prior study. 13 mm right middle lobe  nodule on today's study was 16 mm on the prior study. An 8 mm nodule seen in the lingula (image 84 series 4) was 8 mm previously. There is bilateral dependent collapse/consolidation. Musculoskeletal: Bone windows  reveal no worrisome lytic or sclerotic osseous lesions. CT ABDOMEN PELVIS FINDINGS Hepatobiliary: Multiple hepatic lesions are again identified. 3.5 cm lesion in the medial segment left liver was 2.8 cm on the prior study and is more necrotic on today's exam. 2.6 cm lesion in the lateral segment of the left liver was 2.6 cm previously. 3.5 cm lesion inferior aspect of the medial segment left liver, adjacent to the gallbladder measured only 1.6 cm on the prior study. Multiple new liver lesions are seen in the dome of the right liver (compare image 40 series 2 today to image 15 series 11 previously. Gallbladder wall thickening or pericholecystic fluid noted. No intrahepatic or extrahepatic biliary dilation. Pancreas: No focal mass lesion. No dilatation of the main duct. No intraparenchymal cyst. No peripancreatic edema. Spleen: No splenomegaly. No focal mass lesion. Adrenals/Urinary Tract: No adrenal nodule or mass. Right kidney unremarkable. Asymmetrically decreased perfusion noted to the left kidney and there is mild left hydroureteronephrosis. Left ureter remains dilated into the pelvis where abnormal soft tissue is identified along the left pelvic sidewall. Stomach/Bowel: Stomach is nondistended. No gastric wall thickening. No evidence of outlet obstruction. Duodenum is normally positioned as is the ligament of Treitz. No small bowel wall thickening. No small bowel dilatation. The terminal ileum is normal. The appendix is normal. No gross colonic mass. No colonic wall thickening. No substantial diverticular change. Left lower quadrant sigmoid end colostomy noted. Vascular/Lymphatic: No abdominal aortic aneurysm. Mild hepatoduodenal ligament lymphadenopathy appears progressed in the interval. Necrotic left  periaortic lymph node just caudal to the left renal vein is 15 mm short axis today which compares to 14 mm previously. A second 13 mm left para-aortic lymph node seen on image 66 was 8 mm previously. Reproductive: Irregular and heterogeneous soft tissue again incorporates the uterus as seen on multiple prior studies. Neoplastic involvement is a distinct consideration in this process extends to the sidewall of the pelvis bilaterally in accounts for the evolving left hydroureteronephrosis. Other: Small volume intraperitoneal free fluid. Musculoskeletal: Bone windows reveal no worrisome lytic or sclerotic osseous lesions. IMPRESSION: 1. Aside from the presence of the right-sided PICC line, no IV 6 bone a shin for the patient's reported history of acute onset right upper extremity swelling. There may be some thrombus along the PICC line in the subclavian vein, but this is not well assessed because of the streak artifact from the PICC line itself. 2. Clear interval progression of some of the hepatic metastases. 3. The innumerable irregular pulmonary show no clear trend towards progression or regression. There is a new pleural-based nodule in the right apex and other nodules measure minimally larger and smaller since prior study. 4. There is new bibasilar collapse/consolidation with new small bilateral pleural effusions. 5. New mild lymphadenopathy in the hepatoduodenal ligament with stable to progression of para-aortic upper abdominal lymphadenopathy. 6. Similar irregular soft tissue in the pelvis involving the uterus with extension to the sidewalls bilaterally. Incorporation of the distal left ureter on the left results and mild left hydroureteronephrosis and decreased perfusion to the left kidney on today's study is consistent with a component of associated obstructive uropathy. Electronically Signed   By: Misty Stanley M.D.   On: 02/27/2017 18:06   US Venous Img Upper Uni Right  Result Date: 02/27/2017 CLINICAL  DATA:  Right upper extremity edema. EXAM: RIGHT UPPER EXTREMITY VENOUS DOPPLER ULTRASOUND TECHNIQUE: Gray-scale sonography with graded compression, as well as color Doppler and duplex ultrasound were performed to evaluate the upper extremity deep  venous system from the level of the subclavian vein and including the jugular, axillary, basilic, radial, ulnar and upper cephalic vein. Spectral Doppler was utilized to evaluate flow at rest and with distal augmentation maneuvers. COMPARISON:  None. FINDINGS: Contralateral Subclavian Vein: Respiratory phasicity is normal and symmetric with the symptomatic side. No evidence of thrombus. Normal compressibility. Internal Jugular Vein: No evidence of thrombus. Normal compressibility, respiratory phasicity and response to augmentation. Subclavian Vein: There is a nonocclusive thrombus within the distal subclavian vein, extending into the axillary vein and brachial vein branch. Cephalic Vein: No evidence of thrombus. Normal compressibility, respiratory phasicity and response to augmentation. Basilic Vein: Partially visualized due to bandage obscuring it. No evidence of thrombus where visualized. Normal compressibility, respiratory phasicity and response to augmentation. Radial Veins: No evidence of thrombus. Normal compressibility, respiratory phasicity and response to augmentation. Ulnar Veins: No evidence of thrombus. Normal compressibility, respiratory phasicity and response to augmentation. Venous Reflux:  None visualized. Other Findings: PICC line originating within the brachials vein branch associated with partially occlusive thrombus. 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 These results will be called to the ordering clinician or representative by the Radiologist Assistant, and communication documented in the PACS or zVision Dashboard.  Electronically Signed   By: Fidela Salisbury M.D.   On: 02/27/2017 18:11   Dg Chest Port 1 View  Result Date: 02/28/2017 CLINICAL DATA:  PICC line placement EXAM: PORTABLE CHEST 1 VIEW COMPARISON:  Portable exam 1230 hours compared to 02/27/2017 FINDINGS: LEFT arm PICC line tip projects over RIGHT atrium; recommend withdrawal 3.0 cm to place tip in SVC near the cavoatrial junction. Normal heart size, mediastinal contours and pulmonary vascularity for technique. Bibasilar effusions and atelectasis greater on RIGHT. No pneumothorax. Bones demineralized. IMPRESSION: Recommend withdrawal of LEFT arm PICC line 3.0 cm to place tip in SVC near the cavoatrial junction. Bibasilar atelectasis and pleural effusions greater on RIGHT. Findings called to Idamae Lusher RN on 02/28/2017 at 1243 hrs. Electronically Signed   By: Lavonia Dana M.D.   On: 02/28/2017 12:44     ASSESSMENT & PLAN:  Carcinoma of unknown primary ([place]) # CUP- PDL-1 50%. S/p keytruda #1. Tolerated fairly well. S/p cycle #2 yesterday. July 5th CT C/A/P- show progressive disease in the liver; lungs; small bilateral pleural effusion/baseline consolidation; soft tissue mass in the pelvis [C discussion below]  # Long discussion the patient and husband with the concern for progressive disease as noted on the imaging. However, it is too early to evaluate for disease response at this time. If acute issues resolved- we'll plan to proceed with cycle #3 in approximately 3 weeks.  # Fevers leukocytosis of 52,000/no clear etiology of the infectious source noted. Question of left hydronephrosis; discussed with urology. Awaiting a UA urine culture. Discussed with Dr. Ola Spurr; Pilar Jarvis.   # Right upper extremity swelling- PICC line associated DVT. This has been discontinued; patient has a PICC line on the left side.  # Patient and husband were very tearful- her long discussion regarding the overall poor prognosis/incurable nature of the disease. However, her  immediate life-threatening issue would be ongoing infection; and this is not treated appropriately this could be fatal. Patient reluctantly agreed to be admitted to the hospital.  # history of PE [june 2018]/ PICC line associated DVT- continuie eliquis.   Also discussed with radiology- no evidence of obvious source of drainable abscess. Discussed with hospitalist  physician Dr.Sudini- who kindly agreed to admit the patient. Reviewed with Dr. Janese Banks- : Physician with the weekend.   # I reviewed the blood work- with the patient in detail; also reviewed the imaging independently [as summarized above]; and with the patient in detail.   # 40 minutes face-to-face with the patient discussing the above plan of care; more than 50% of time spent on prognosis/ natural history; counseling and coordination.  # follow-up to be decided based upon  Discharge from the hospital.   No orders of the defined types were placed in this encounter.  All questions were answered. The patient knows to call the clinic with any problems, questions or concerns.      Cammie Sickle, MD 02/28/2017 6:22 PM

## 2017-02-28 NOTE — OR Nursing (Signed)
Anna [name] here to place PICC line. He spoke with Dr. Rogue Bussing about the possibility of trying to declot existing PICC line and Dr. Rogue Bussing reports this line needed to be removed due to the swelling in that arm. Anna has placed a new PICC line in the left arm and removed the right arm catheter. Port CXR was obtained and Dr. Levy Sjogren called and stated the PICC line needed to be pulled back 3 cm. Anna [name] pulled back the catheter 3 cm. And redressed the catheter.

## 2017-02-28 NOTE — Progress Notes (Signed)
Patient here today for follow up from picc line removed from right arm due to blood clot, new picc placement in left arm

## 2017-02-28 NOTE — Telephone Encounter (Signed)
Pt has right sided UE DVT- that PICC needs to come out; and needs PICC on left side for continued IV anti-biotics. Please set this up for today; if any issues let me know. Thx

## 2017-02-28 NOTE — Telephone Encounter (Signed)
Spoke with Pat in [place]. SDS dept now performing procedures. Pat paged vascular dept. - who can perform the procedure today. Discussed pt is on Eliquis - Will need to advise patient to hold am dose of Eliquis per standard procedure. Pt will need to arrive at [place] by 1030. Pt contacted - spoke with patient's husband. Confirmed last dose of Eliquis taken last evening.  Teach back process performed to hold this morning's dose of Eliquis. Orders placed for Right PICC removal and Left arm PICC placement.

## 2017-02-28 NOTE — H&P (Signed)
[facility] NAME: Anna [name]    MR#:  [id number]  DATE OF BIRTH:  05-14-1966  DATE OF ADMISSION:  02/28/2017  PRIMARY CARE PHYSICIAN: McLean-Scocuzza, Nino Glow, MD   REQUESTING/REFERRING PHYSICIAN: Bradd Canary MD  CHIEF COMPLAINT:  No chief complaint on file.   HISTORY OF PRESENT ILLNESS: Anna [name]  is a 51 y.o. female with a known history of carcinoma and primary, currently on palliative care on [place] s/p one cycle who has been on IV meropenem , due to recent complicated history. She had colonic perforation and had to have surgery with a Hartman's pouch on May 8. This was complicated by intra-abdominal abscesses. Cultures from May 8 grew E. coli Eikenella and viridans strep. During her that hospitalization she had to have placement of a drain into an abscess collection. Cultures from that were negative. She was initially discharged on oral Cipro and Flagyl however had worsening leukocytosis it was readmitted May 9 - June 1. CT at that time showed worsening metastatic disease and a phlegmon in the pelvis with presumed abscess collections but the pigtail catheter was in place. At that time she was started on cefepime but developed a rash. Plan was made to place a PICC line and treated with IV meropenem. She was discharged on June 1 on meropenem however was readmitted June 6 with fevers and persistently elevated white count and tachycardia. As an outpatient her white blood count was up to 20,000 and her platelets were elevated in the 700s. She did have a CT which was done and reviewed with surgery and interventional radiology. It did show phlegmon in the pelvis but no drainable fluid collection. Patient had received a dose of vancomycin on admission but developed a bad rash and red man's with it. She was continued on the meropenem which he tolerated and fluconazole was added. However she was persistently febrile and blood cultures turned positive on  for enterococcus and one for coag negative staph. This was a presumed PICC line infection and on June 9 her PICC line was removed and she was started on daptomycin. Follow-up blood cultures were negative so on June 12 of repeat PICC line was placed. She was discharged home on the IV meropenem as well as IV daptomycin for further 3 days and oral fluconazole. She went for follow up with oncology for carcinoma unkown primary, she c/o continued fever, she noted to have mild hydronephrosis on ct scan as well as other changes. We were asked to admit for IV antibiotics.  Dr. Bradd Canary D/w urology and ID to see patient.  Patient also yesterday had appropriate grooming due to right upper extremity swelling noted to her DVT. Now she has a PICC line in the left upper extremity she is on a low course therapy for previous PE. Patient noted to have bilateral lower extremity swelling in both legs.            PAST MEDICAL HISTORY:   Past Medical History:  Diagnosis Date  . Cancer ([place])   . Cancer with unknown primary site [facility])   . Colon polyp   . Diverticulosis   . Fibroid uterus   . Heme positive stool 08/01/2014  . HH (hiatus hernia)   . Large bowel perforation ([place]) 12/31/2016  . Metastatic adenocarcinoma ([place]) 12/20/2016  . Pelvic pain in female 01/13/2017  . Perforation of sigmoid colon ([place])     PAST SURGICAL HISTORY: Past Surgical History:  Procedure Laterality Date  .  COLONOSCOPY    . CYSTOSCOPY  12/20/2016   Procedure: CYSTOSCOPY;  Surgeon: Boykin Nearing, MD;  Location: [facility] ORS;  Service: Gynecology;;  . ESOPHAGOGASTRODUODENOSCOPY    . LAPAROSCOPIC BILATERAL SALPINGECTOMY Bilateral 12/20/2016   Procedure: LAPAROSCOPIC BILATERAL SALPINGOOPHERECTOMY,  Excision of anterior 5 cm calcified uterine fibroid;  Surgeon: Boykin Nearing, MD;  Location: [facility] ORS;  Service: Gynecology;  Laterality: Bilateral;  . LAPAROSCOPIC LYSIS OF ADHESIONS  12/20/2016   Procedure: LAPAROSCOPIC LYSIS  OF ADHESIONS WITH DISSECTION OF LEFT TUBE AND OVARY.;  Surgeon: Olean Ree, MD;  Location: [facility] ORS;  Service: General;;  . LAPAROTOMY N/A 12/31/2016   Procedure: EXPLORATORY LAPAROTOMY, sigmoid colectomy, removal of omentum, colostomy;  Surgeon: Jules Husbands, MD;  Location: [facility] ORS;  Service: General;  Laterality: N/A;  . SIGMOIDOSCOPY  12/20/2016   Procedure: RIGID SIGMOIDOSCOPY;  Surgeon: Olean Ree, MD;  Location: [facility] ORS;  Service: General;;  . TONSILLECTOMY      SOCIAL HISTORY:  Social History  Substance Use Topics  . Smoking status: Never Smoker  . Smokeless tobacco: Never Used  . Alcohol use No    FAMILY HISTORY:  Family History  Problem Relation Age of Onset  . Colon cancer Mother 72  . Diabetes Mother   . Asthma Father   . Breast cancer Neg Hx     DRUG ALLERGIES:  Allergies  Allergen Reactions  . Cefepime Rash  . Penicillins Rash    As a child. Has patient had a PCN reaction causing immediate rash, facial/tongue/throat swelling, SOB or lightheadedness with hypotension: Unknown Has patient had a PCN reaction causing severe rash involving mucus membranes or skin necrosis: Unknown Has patient had a PCN reaction that required hospitalization: No Has patient had a PCN reaction occurring within the last 10 years: No If all of the above answers are "NO", then may proceed with Cephalosporin use.     REVIEW OF SYSTEMS:   CONSTITUTIONAL: No fever, fatigue or weakness.  EYES: No blurred or double vision.  EARS, NOSE, AND THROAT: No tinnitus or ear pain.  RESPIRATORY: No cough, shortness of breath, wheezing or hemoptysis.  CARDIOVASCULAR: No chest pain, orthopnea, edema.  GASTROINTESTINAL: No nausea, vomiting, diarrhea or abdominal pain.  GENITOURINARY: No dysuria, hematuria.  ENDOCRINE: No polyuria, nocturia,  HEMATOLOGY: No anemia, easy bruising or bleeding SKIN: No rash or lesion. MUSCULOSKELETAL: No joint pain or arthritis.  Right upper extremity swelling as  well as both lower extremities NEUROLOGIC: No tingling, numbness, weakness.  PSYCHIATRY: No anxiety or depression.   MEDICATIONS AT HOME:  Prior to Admission medications   Medication Sig Start Date End Date Taking? Authorizing Provider  acetaminophen (TYLENOL) 500 MG tablet Take 500 mg by mouth as needed for mild pain.    [provider]  apixaban (ELIQUIS) 5 MG TABS tablet Take 1 tablet (5 mg total) by mouth 2 (two) times daily. 02/04/17   Loletha Grayer, MD  diltiazem (CARDIZEM CD) 240 MG 24 hr capsule Take 1 capsule (240 mg total) by mouth daily. 02/05/17   Loletha Grayer, MD  diphenhydrAMINE (BENADRYL) 25 mg capsule Take 1 capsule (25 mg total) by mouth every 6 (six) hours as needed (Rash). 01/23/17   Theodoro Grist, MD  ferrous sulfate 325 (65 FE) MG tablet Take 1 tablet (325 mg total) by mouth daily with breakfast. 02/05/17   Loletha Grayer, MD  meropenem 1 g in sodium chloride 0.9 % 100 mL Inject 1 g into the vein every 8 (eight) hours. 01/23/17  Theodoro Grist, MD  metoprolol tartrate (LOPRESSOR) 25 MG tablet Take 1 tablet (25 mg total) by mouth 3 (three) times daily after meals. 02/04/17   Loletha Grayer, MD  ondansetron (ZOFRAN) 4 MG tablet Take 1 tablet (4 mg total) by mouth every 6 (six) hours as needed for nausea. 02/04/17   Loletha Grayer, MD  oxyCODONE-acetaminophen (ROXICET) 5-325 MG tablet Take 1 tablet by mouth every 6 (six) hours as needed. 02/04/17 02/04/18  Loletha Grayer, MD  potassium chloride SA (K-DUR,KLOR-CON) 20 MEQ tablet Take 1 tablet (20 mEq total) by mouth 2 (two) times daily. 02/27/17   Cammie Sickle, MD      PHYSICAL EXAMINATION:   VITAL SIGNS: Last menstrual period 03/01/2015.  GENERAL:  51 y.o.-year-old patient lying in the bed with no acute distress.  EYES: Pupils equal, round, reactive to light and accommodation. No scleral icterus. Extraocular muscles intact.  HEENT: Head atraumatic, normocephalic. Oropharynx and nasopharynx clear.   NECK:  Supple, no jugular venous distention. No thyroid enlargement, no tenderness.  LUNGS: Normal breath sounds bilaterally, no wheezing, rales,rhonchi or crepitation. No use of accessory muscles of respiration.  CARDIOVASCULAR: S1, S2 normal. No murmurs, rubs, or gallops.  ABDOMEN: Soft, nontender, nondistended. Bowel sounds present. No organomegaly or mass. Wound vac in abdomen EXTREMITIES: Right upper extremity swelling and bilateral lower extremity swelling NEUROLOGIC: Cranial nerves II through XII are intact. Muscle strength 5/5 in all extremities. Sensation intact. Gait not checked.  PSYCHIATRIC: The patient is alert and oriented x 3.  SKIN: No obvious rash, lesion, or ulcer.   LABORATORY PANEL:   CBC  Recent Labs Lab 02/27/17 1053  WBC 52.6*  HGB 7.8*  HCT 24.2*  PLT 719*  MCV 83.0  MCH 26.8  MCHC 32.2  RDW 17.8*  LYMPHSABS 0.8*  MONOABS 1.3*  EOSABS 0.2  BASOSABS 0.1   ------------------------------------------------------------------------------------------------------------------  Chemistries   Recent Labs Lab 02/27/17 1053  NA 133*  K 2.6*  CL 95*  CO2 30  GLUCOSE 154*  BUN 7  CREATININE 0.71  CALCIUM 7.8*  AST 41  ALT 30  ALKPHOS 141*  BILITOT 0.5   ------------------------------------------------------------------------------------------------------------------ estimated creatinine clearance is 67.3 mL/min (by C-G formula based on SCr of 0.71 mg/dL). ------------------------------------------------------------------------------------------------------------------ No results for input(s): TSH, T4TOTAL, T3FREE, THYROIDAB in the last 72 hours.  Invalid input(s): FREET3   Coagulation profile No results for input(s): INR, PROTIME in the last 168 hours. ------------------------------------------------------------------------------------------------------------------- No results for input(s): DDIMER in the last 72  hours. -------------------------------------------------------------------------------------------------------------------  Cardiac Enzymes No results for input(s): CKMB, TROPONINI, MYOGLOBIN in the last 168 hours.  Invalid input(s): CK ------------------------------------------------------------------------------------------------------------------ Invalid input(s): POCBNP  ---------------------------------------------------------------------------------------------------------------  Urinalysis    Component Value Date/Time   COLORURINE AMBER (A) 01/29/2017 1714   APPEARANCEUR TURBID (A) 01/29/2017 1714   LABSPEC 1.020 01/29/2017 1714   PHURINE 6.0 01/29/2017 1714   GLUCOSEU NEGATIVE 01/29/2017 1714   HGBUR NEGATIVE 01/29/2017 1714   BILIRUBINUR NEGATIVE 01/29/2017 1714   KETONESUR 20 (A) 01/29/2017 1714   PROTEINUR 30 (A) 01/29/2017 1714   NITRITE NEGATIVE 01/29/2017 1714   LEUKOCYTESUR NEGATIVE 01/29/2017 1714     RADIOLOGY: Ct Chest W Contrast  Result Date: 02/27/2017 CLINICAL DATA:  Fever and cough for several weeks. History of carcinoma of unknown primary. Right arm swelling starting last night. EXAM: CT CHEST, ABDOMEN, AND PELVIS WITH CONTRAST TECHNIQUE: Multidetector CT imaging of the chest, abdomen and pelvis was performed following the standard protocol during bolus administration of intravenous contrast. CONTRAST:  148mL ISOVUE-300 IOPAMIDOL (  ISOVUE-300) INJECTION 61% COMPARISON:  01/29/2017 FINDINGS: CT CHEST FINDINGS Cardiovascular: Heart size upper normal. No pericardial effusion. Right PICC line tip is positioned at the SVC/RA junction. Mediastinum/Nodes: Scattered small to borderline enlarged mediastinal and hilar lymph nodes are noted bilaterally. There is no hilar lymphadenopathy. The esophagus has normal imaging features. Lungs/Pleura: Small bilateral pleural effusions are new in the interval. 12 mm posterior right upper lobe pleural-based nodule has progressed since  prior study. Innumerable ill-defined and irregular pulmonary nodules are again identified. 10 mm left upper lobe nodule image 31 series 4 was 10 mm on the prior study. 13 mm right middle lobe nodule on today's study was 16 mm on the prior study. An 8 mm nodule seen in the lingula (image 84 series 4) was 8 mm previously. There is bilateral dependent collapse/consolidation. Musculoskeletal: Bone windows reveal no worrisome lytic or sclerotic osseous lesions. CT ABDOMEN PELVIS FINDINGS Hepatobiliary: Multiple hepatic lesions are again identified. 3.5 cm lesion in the medial segment left liver was 2.8 cm on the prior study and is more necrotic on today's exam. 2.6 cm lesion in the lateral segment of the left liver was 2.6 cm previously. 3.5 cm lesion inferior aspect of the medial segment left liver, adjacent to the gallbladder measured only 1.6 cm on the prior study. Multiple new liver lesions are seen in the dome of the right liver (compare image 40 series 2 today to image 15 series 11 previously. Gallbladder wall thickening or pericholecystic fluid noted. No intrahepatic or extrahepatic biliary dilation. Pancreas: No focal mass lesion. No dilatation of the main duct. No intraparenchymal cyst. No peripancreatic edema. Spleen: No splenomegaly. No focal mass lesion. Adrenals/Urinary Tract: No adrenal nodule or mass. Right kidney unremarkable. Asymmetrically decreased perfusion noted to the left kidney and there is mild left hydroureteronephrosis. Left ureter remains dilated into the pelvis where abnormal soft tissue is identified along the left pelvic sidewall. Stomach/Bowel: Stomach is nondistended. No gastric wall thickening. No evidence of outlet obstruction. Duodenum is normally positioned as is the ligament of Treitz. No small bowel wall thickening. No small bowel dilatation. The terminal ileum is normal. The appendix is normal. No gross colonic mass. No colonic wall thickening. No substantial diverticular change.  Left lower quadrant sigmoid end colostomy noted. Vascular/Lymphatic: No abdominal aortic aneurysm. Mild hepatoduodenal ligament lymphadenopathy appears progressed in the interval. Necrotic left periaortic lymph node just caudal to the left renal vein is 15 mm short axis today which compares to 14 mm previously. A second 13 mm left para-aortic lymph node seen on image 66 was 8 mm previously. Reproductive: Irregular and heterogeneous soft tissue again incorporates the uterus as seen on multiple prior studies. Neoplastic involvement is a distinct consideration in this process extends to the sidewall of the pelvis bilaterally in accounts for the evolving left hydroureteronephrosis. Other: Small volume intraperitoneal free fluid. Musculoskeletal: Bone windows reveal no worrisome lytic or sclerotic osseous lesions. IMPRESSION: 1. Aside from the presence of the right-sided PICC line, no IV 6 bone a shin for the patient's reported history of acute onset right upper extremity swelling. There may be some thrombus along the PICC line in the subclavian vein, but this is not well assessed because of the streak artifact from the PICC line itself. 2. Clear interval progression of some of the hepatic metastases. 3. The innumerable irregular pulmonary show no clear trend towards progression or regression. There is a new pleural-based nodule in the right apex and other nodules measure minimally larger and smaller since prior  study. 4. There is new bibasilar collapse/consolidation with new small bilateral pleural effusions. 5. New mild lymphadenopathy in the hepatoduodenal ligament with stable to progression of para-aortic upper abdominal lymphadenopathy. 6. Similar irregular soft tissue in the pelvis involving the uterus with extension to the sidewalls bilaterally. Incorporation of the distal left ureter on the left results and mild left hydroureteronephrosis and decreased perfusion to the left kidney on today's study is consistent  with a component of associated obstructive uropathy. Electronically Signed   By: Misty Stanley M.D.   On: 02/27/2017 18:06   Ct Abdomen Pelvis W Contrast  Result Date: 02/27/2017 CLINICAL DATA:  Fever and cough for several weeks. History of carcinoma of unknown primary. Right arm swelling starting last night. EXAM: CT CHEST, ABDOMEN, AND PELVIS WITH CONTRAST TECHNIQUE: Multidetector CT imaging of the chest, abdomen and pelvis was performed following the standard protocol during bolus administration of intravenous contrast. CONTRAST:  164mL ISOVUE-300 IOPAMIDOL (ISOVUE-300) INJECTION 61% COMPARISON:  01/29/2017 FINDINGS: CT CHEST FINDINGS Cardiovascular: Heart size upper normal. No pericardial effusion. Right PICC line tip is positioned at the SVC/RA junction. Mediastinum/Nodes: Scattered small to borderline enlarged mediastinal and hilar lymph nodes are noted bilaterally. There is no hilar lymphadenopathy. The esophagus has normal imaging features. Lungs/Pleura: Small bilateral pleural effusions are new in the interval. 12 mm posterior right upper lobe pleural-based nodule has progressed since prior study. Innumerable ill-defined and irregular pulmonary nodules are again identified. 10 mm left upper lobe nodule image 31 series 4 was 10 mm on the prior study. 13 mm right middle lobe nodule on today's study was 16 mm on the prior study. An 8 mm nodule seen in the lingula (image 84 series 4) was 8 mm previously. There is bilateral dependent collapse/consolidation. Musculoskeletal: Bone windows reveal no worrisome lytic or sclerotic osseous lesions. CT ABDOMEN PELVIS FINDINGS Hepatobiliary: Multiple hepatic lesions are again identified. 3.5 cm lesion in the medial segment left liver was 2.8 cm on the prior study and is more necrotic on today's exam. 2.6 cm lesion in the lateral segment of the left liver was 2.6 cm previously. 3.5 cm lesion inferior aspect of the medial segment left liver, adjacent to the gallbladder  measured only 1.6 cm on the prior study. Multiple new liver lesions are seen in the dome of the right liver (compare image 40 series 2 today to image 15 series 11 previously. Gallbladder wall thickening or pericholecystic fluid noted. No intrahepatic or extrahepatic biliary dilation. Pancreas: No focal mass lesion. No dilatation of the main duct. No intraparenchymal cyst. No peripancreatic edema. Spleen: No splenomegaly. No focal mass lesion. Adrenals/Urinary Tract: No adrenal nodule or mass. Right kidney unremarkable. Asymmetrically decreased perfusion noted to the left kidney and there is mild left hydroureteronephrosis. Left ureter remains dilated into the pelvis where abnormal soft tissue is identified along the left pelvic sidewall. Stomach/Bowel: Stomach is nondistended. No gastric wall thickening. No evidence of outlet obstruction. Duodenum is normally positioned as is the ligament of Treitz. No small bowel wall thickening. No small bowel dilatation. The terminal ileum is normal. The appendix is normal. No gross colonic mass. No colonic wall thickening. No substantial diverticular change. Left lower quadrant sigmoid end colostomy noted. Vascular/Lymphatic: No abdominal aortic aneurysm. Mild hepatoduodenal ligament lymphadenopathy appears progressed in the interval. Necrotic left periaortic lymph node just caudal to the left renal vein is 15 mm short axis today which compares to 14 mm previously. A second 13 mm left para-aortic lymph node seen on image 66 was  8 mm previously. Reproductive: Irregular and heterogeneous soft tissue again incorporates the uterus as seen on multiple prior studies. Neoplastic involvement is a distinct consideration in this process extends to the sidewall of the pelvis bilaterally in accounts for the evolving left hydroureteronephrosis. Other: Small volume intraperitoneal free fluid. Musculoskeletal: Bone windows reveal no worrisome lytic or sclerotic osseous lesions. IMPRESSION: 1.  Aside from the presence of the right-sided PICC line, no IV 6 bone a shin for the patient's reported history of acute onset right upper extremity swelling. There may be some thrombus along the PICC line in the subclavian vein, but this is not well assessed because of the streak artifact from the PICC line itself. 2. Clear interval progression of some of the hepatic metastases. 3. The innumerable irregular pulmonary show no clear trend towards progression or regression. There is a new pleural-based nodule in the right apex and other nodules measure minimally larger and smaller since prior study. 4. There is new bibasilar collapse/consolidation with new small bilateral pleural effusions. 5. New mild lymphadenopathy in the hepatoduodenal ligament with stable to progression of para-aortic upper abdominal lymphadenopathy. 6. Similar irregular soft tissue in the pelvis involving the uterus with extension to the sidewalls bilaterally. Incorporation of the distal left ureter on the left results and mild left hydroureteronephrosis and decreased perfusion to the left kidney on today's study is consistent with a component of associated obstructive uropathy. Electronically Signed   By: Misty Stanley M.D.   On: 02/27/2017 18:06   US Venous Img Upper Uni Right  Result Date: 02/27/2017 CLINICAL DATA:  Right upper extremity edema. EXAM: RIGHT UPPER EXTREMITY VENOUS DOPPLER ULTRASOUND TECHNIQUE: Gray-scale sonography with graded compression, as well as color Doppler and duplex ultrasound were performed to evaluate the upper extremity deep venous system from the level of the subclavian vein and including the jugular, axillary, basilic, radial, ulnar and upper cephalic vein. Spectral Doppler was utilized to evaluate flow at rest and with distal augmentation maneuvers. COMPARISON:  None. FINDINGS: Contralateral Subclavian Vein: Respiratory phasicity is normal and symmetric with the symptomatic side. No evidence of thrombus. Normal  compressibility. Internal Jugular Vein: No evidence of thrombus. Normal compressibility, respiratory phasicity and response to augmentation. Subclavian Vein: There is a nonocclusive thrombus within the distal subclavian vein, extending into the axillary vein and brachial vein branch. Cephalic Vein: No evidence of thrombus. Normal compressibility, respiratory phasicity and response to augmentation. Basilic Vein: Partially visualized due to bandage obscuring it. No evidence of thrombus where visualized. Normal compressibility, respiratory phasicity and response to augmentation. Radial Veins: No evidence of thrombus. Normal compressibility, respiratory phasicity and response to augmentation. Ulnar Veins: No evidence of thrombus. Normal compressibility, respiratory phasicity and response to augmentation. Venous Reflux:  None visualized. Other Findings: PICC line originating within the brachials vein branch associated with partially occlusive thrombus. 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 These results will be called to the ordering clinician or representative by the Radiologist Assistant, and communication documented in the PACS or zVision Dashboard. Electronically Signed   By: Fidela Salisbury M.D.   On: 02/27/2017 18:11   Dg Chest Port 1 View  Result Date: 02/28/2017 CLINICAL DATA:  PICC line placement EXAM: PORTABLE CHEST 1 VIEW COMPARISON:  Portable exam 1230 hours compared to 02/27/2017 FINDINGS: LEFT arm PICC line tip projects over RIGHT atrium; recommend withdrawal 3.0 cm to place tip in SVC near the cavoatrial junction. Normal  heart size, mediastinal contours and pulmonary vascularity for technique. Bibasilar effusions and atelectasis greater on RIGHT. No pneumothorax. Bones demineralized. IMPRESSION: Recommend withdrawal of LEFT arm PICC line 3.0 cm to place tip in SVC near the  cavoatrial junction. Bibasilar atelectasis and pleural effusions greater on RIGHT. Findings called to Idamae Lusher RN on 02/28/2017 at 1243 hrs. Electronically Signed   By: Lavonia Dana M.D.   On: 02/28/2017 12:44    EKG: Orders placed or performed during the hospital encounter of 01/29/17  . ED EKG 12-Lead  . ED EKG 12-Lead    IMPRESSION AND PLAN: Patient is a complicated 51 year old with complicated intraabdominal infection as well as metastatic disease who is being admitted with fever  1. Fever suspect due to persistent intra abdominal source, I have D/w ID- they would like pt to be on Vancomycine, meropenum and fluconazole. Blood cx drawn yesterday. Fever could be related to her PICC related dvt as well as underlying malignancy as well  2. Hydronephrosis clinical significance uncertain- Oncology has d/w with urology, will ask them to see patient  3. Sever hypokalemia noted on recent bmp, check bmp give, K+ , check mg  4. B/l swelling due to severe hypoalonemia, dietary consult , ensure cans  5. 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   6. Code full   All the records are reviewed and case discussed with ED provider. Management plans discussed with the patient, family and they are in agreement.  CODE STATUS: Code Status History    Date Active Date Inactive Code Status Order ID Comments User Context   01/30/2017 12:09 AM 02/04/2017 10:43 PM Full Code [id number]  [facility], Ubaldo Glassing, DO Inpatient   01/21/2017  9:00 PM 01/24/2017  5:38 PM Full Code [id number]  Fritzi Mandes, MD Inpatient   12/31/2016  3:58 AM 01/10/2017  4:29 PM Full Code [id number]  Jules Husbands, MD Inpatient   12/30/2016 10:48 PM 12/31/2016  3:58 AM Full Code [id number]  Schermerhorn, Gwen Her, MD ED   12/20/2016 11:16 AM 12/21/2016  1:51 PM Full Code [id number]  Schermerhorn, Gwen Her, MD Inpatient       TOTAL TIME TAKING CARE OF THIS PATIENT: 18minutes.    Dustin Flock M.D on 02/28/2017 at  4:40 PM  Between 7am to 6pm - Pager - [phone]  After 6pm go to [email or web address] - password [email or web address] [facility]  [facility]  Office  [phone]  CC: Primary care physician; McLean-Scocuzza, Nino Glow, MD

## 2017-03-01 DIAGNOSIS — R509 Fever, unspecified: Secondary | ICD-10-CM

## 2017-03-01 DIAGNOSIS — N1330 Unspecified hydronephrosis: Secondary | ICD-10-CM

## 2017-03-01 DIAGNOSIS — D72829 Elevated white blood cell count, unspecified: Secondary | ICD-10-CM

## 2017-03-01 LAB — BASIC METABOLIC PANEL
Anion gap: 6 (ref 5–15)
BUN: 7 mg/dL (ref 6–20)
CALCIUM: 7.6 mg/dL — AB (ref 8.9–10.3)
CO2: 29 mmol/L (ref 22–32)
CREATININE: 0.49 mg/dL (ref 0.44–1.00)
Chloride: 99 mmol/L — ABNORMAL LOW (ref 101–111)
GFR calc Af Amer: >60 mL/min (ref >60)
GFR calc non Af Amer: >60 mL/min (ref >60)
GLUCOSE: 92 mg/dL (ref 65–99)
Potassium: 3.7 mmol/L (ref 3.5–5.1)
Sodium: 134 mmol/L — ABNORMAL LOW (ref 135–145)

## 2017-03-01 LAB — URINE CULTURE: CULTURE: NO GROWTH

## 2017-03-01 LAB — CBC
HEMATOCRIT: 22.3 % — AB (ref 35.0–47.0)
HEMOGLOBIN: 7.1 g/dL — AB (ref 12.0–16.0)
MCH: 26.5 pg (ref 26.0–34.0)
MCHC: 31.6 g/dL — AB (ref 32.0–36.0)
MCV: 83.7 fL (ref 80.0–100.0)
Platelets: 690 10*3/uL — ABNORMAL HIGH (ref 150–440)
RBC: 2.66 MIL/uL — ABNORMAL LOW (ref 3.80–5.20)
RDW: 18.2 % — AB (ref 11.5–14.5)
WBC: 50.2 10*3/uL (ref 3.6–11.0)

## 2017-03-01 LAB — CLOSTRIDIUM DIFFICILE BY PCR: Toxigenic C. Difficile by PCR: NEGATIVE

## 2017-03-01 LAB — C DIFFICILE QUICK SCREEN W PCR REFLEX
C DIFFICILE (CDIFF) TOXIN: NEGATIVE
C Diff antigen: POSITIVE — AB

## 2017-03-01 MED ORDER — SODIUM CHLORIDE 0.9% FLUSH
10.0000 mL | INTRAVENOUS | Status: AC | PRN
  Administered 2017-03-01 – 2017-03-02 (×2): 10 mL

## 2017-03-01 MED ORDER — SODIUM CHLORIDE 0.9 % IV SOLN
1.0000 g | Freq: Three times a day (TID) | INTRAVENOUS | Status: DC
  Administered 2017-03-01 – 2017-03-04 (×11): 1 g via INTRAVENOUS
  Filled 2017-03-01 (×12): qty 1

## 2017-03-01 MED ORDER — METOPROLOL SUCCINATE ER 50 MG PO TB24
50.0000 mg | ORAL_TABLET | Freq: Every day | ORAL | Status: DC
  Administered 2017-03-01 – 2017-03-04 (×4): 50 mg via ORAL
  Filled 2017-03-01 (×4): qty 1

## 2017-03-01 MED ORDER — DILTIAZEM HCL ER COATED BEADS 240 MG PO CP24
240.0000 mg | ORAL_CAPSULE | Freq: Every day | ORAL | Status: DC
  Administered 2017-03-01 – 2017-03-03 (×3): 240 mg via ORAL
  Filled 2017-03-01 (×4): qty 1

## 2017-03-01 MED ORDER — HEPARIN SOD (PORK) LOCK FLUSH 100 UNIT/ML IV SOLN
250.0000 [IU] | INTRAVENOUS | Status: AC | PRN
  Administered 2017-03-02: 250 [IU]
  Filled 2017-03-01: qty 3

## 2017-03-01 NOTE — Plan of Care (Signed)
Problem: Bowel/Gastric: Goal: Will not experience complications related to bowel motility Outcome: Progressing Colostomy remains intact; self pt care; Dr Ola Spurr orders received in [facility] to check stool for cdiff this shift; Type 4 stool sent to lab for cdiff check, advised lab via telephone call that the pt's colostomy does not have any liquid stool within, to send what's there in order to check for cdiff

## 2017-03-01 NOTE — Progress Notes (Signed)
ID E note  Patient admitted yesterday. I had discussed case with Dr Rogue Bussing and reviewed imaging. She has a progressive leukocytosis and intermittent fevers without a source. Clinically she has no decompensation and no pain. Her CT chest shows mets, possible consolidation and bil  Effusion CT abd shows multiple mets, some necrotic appearing in liver.  L hydronephrosis, necrotic L periaortic LN and an irregular and heterogeneous soft tissue mass incorporating the uterus  She has been seen by urology and they do not believe etiology of leukocytosis is related to the hydronephrosis.  He UA is unimpressive.  She has a colostomy but no report of diarrhea and no colitis noted on CT scan so C diff is less likely but would rule it out.  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 She is on Keytruda.    Rec Cont meropenem Add vanco in case HCAP given some consolidation in lung - prior was on daptomycin for picc line infection but this does not provide pulmonary coverage Restart fluconazole for fungal coverage Check C diff.

## 2017-03-01 NOTE — Progress Notes (Signed)
Assessment and Plan: Mild left hydro without fever or flank pain.    Urine culture is pending but UA was negative.   No need for urgent intervention.    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  She has no flank pain or fever.   Her WBC count remains above 50K.   Cr is normal.   She has no voiding complaints or associated signs or symptoms.   Urine culture is pending.   I reviewed her CT films and there was slight dilation on the films from 6/6 as well.    ROS:  Review of Systems  Constitutional: Negative for chills and fever.  Gastrointestinal: Negative for abdominal pain, nausea and vomiting.  Genitourinary: Negative.     Anti-infectives: Anti-infectives    Start     Dose/Rate Route Frequency Ordered Stop   03/01/17 0500  vancomycin (VANCOCIN) IVPB 750 mg/150 ml premix     750 mg 150 mL/hr over 60 Minutes Intravenous Every 12 hours 02/28/17 1642     03/01/17 0200  meropenem (MERREM) 1 g in sodium chloride 0.9 % 100 mL IVPB     1 g 200 mL/hr over 30 Minutes Intravenous Every 8 hours 03/01/17 0033     02/28/17 1700  meropenem (MERREM) 1 g in sodium chloride 0.9 % 100 mL IVPB  Status:  Discontinued     1 g 200 mL/hr over 30 Minutes Intravenous Every 8 hours 02/28/17 1647 03/01/17 0033   02/28/17 1700  fluconazole (DIFLUCAN) IVPB 200 mg     200 mg 100 mL/hr over 60 Minutes Intravenous Every 24 hours 02/28/17 1639     02/28/17 1645  vancomycin (VANCOCIN) IVPB 1000 mg/200 mL premix     1,000 mg 200 mL/hr over 60 Minutes Intravenous  Once 02/28/17 1640 02/28/17 1941      Current Facility-Administered Medications  Medication Dose Route Frequency Provider Last Rate Last Dose  . 0.9 %  sodium chloride infusion  250 mL Intravenous PRN Dustin Flock, MD      . acetaminophen (TYLENOL) tablet 650 mg  650 mg Oral Q6H PRN Dustin Flock, MD       Or  . acetaminophen (TYLENOL) suppository 650 mg  650 mg Rectal Q6H PRN Dustin Flock, MD      . apixaban  Arne Cleveland) tablet 5 mg  5 mg Oral BID Dustin Flock, MD   5 mg at 03/01/17 0935  . diltiazem (CARDIZEM CD) 24 hr capsule 240 mg  240 mg Oral QHS Dustin Flock, MD      . diphenhydrAMINE (BENADRYL) capsule 25 mg  25 mg Oral Q6H PRN Dustin Flock, MD      . ferrous sulfate tablet 325 mg  325 mg Oral Q breakfast Dustin Flock, MD   325 mg at 03/01/17 0935  . fluconazole (DIFLUCAN) IVPB 200 mg  200 mg Intravenous Q24H Dustin Flock, MD   Stopped at 02/28/17 2053  . heparin lock flush 100 unit/mL  250 Units Intracatheter PRN Dustin Flock, MD      . meropenem (MERREM) 1 g in sodium chloride 0.9 % 100 mL IVPB  1 g Intravenous Q8H Loletha Grayer, MD   Stopped at 03/01/17 1005  . metoprolol tartrate (LOPRESSOR) tablet 25 mg  25 mg Oral TID PC Dustin Flock, MD      . ondansetron [facility]) tablet 4 mg  4 mg Oral Q6H PRN Dustin Flock, MD       Or  . ondansetron [facility]) injection 4 mg  4 mg Intravenous Q6H PRN Dustin Flock, MD      . oxyCODONE-acetaminophen (PERCOCET/ROXICET) 5-325 MG per tablet 1-2 tablet  1-2 tablet Oral Q6H PRN Dustin Flock, MD      . vancomycin (VANCOCIN) IVPB 750 mg/150 ml premix  750 mg Intravenous Q12H Dustin Flock, MD   Stopped at 03/01/17 0557     Objective: Vital signs in last 24 hours: Temp:  [97.8 F (36.6 C)-100.8 F (38.2 C)] 98.7 F (37.1 C) (07/07 0446) Pulse Rate:  [86-137] 113 (07/07 0923) Resp:  [18] 18 (07/06 1833) BP: (95-140)/(52-94) 102/63 (07/07 0923) SpO2:  [85 %-100 %] 93 % (07/07 0451) Weight:  [57.3 kg (126 lb 4 oz)-57.3 kg (126 lb 6.4 oz)] 57.3 kg (126 lb 6.4 oz) (07/06 1659)  Intake/Output from previous day: 07/06 0701 - 07/07 0700 In: 320 [P.O.:120; IV Piggyback:200] Out: -  Intake/Output this shift: Total I/O In: 336 [P.O.:240; IV Piggyback:96] Out: -    Physical Exam  Constitutional: She is well-developed, well-nourished, and in no distress.  Abdominal: Soft. There is no tenderness.  LLQ colostomy    Lab  Results:   Recent Labs  02/28/17 1825 03/01/17 0452  WBC 56.1* 50.2*  HGB 8.0* 7.1*  HCT 25.8* 22.3*  PLT 750* 690*   BMET  Recent Labs  02/28/17 1825 03/01/17 0452  NA 133* 134*  K 3.9 3.7  CL 97* 99*  CO2 27 29  GLUCOSE 153* 92  BUN 8 7  CREATININE 0.70 0.49  CALCIUM 7.9* 7.6*   PT/INR No results for input(s): LABPROT, INR in the last 72 hours. ABG No results for input(s): PHART, HCO3 in the last 72 hours.  Invalid input(s): PCO2, PO2  Studies/Results: Ct Chest W Contrast  Result Date: 02/27/2017 CLINICAL DATA:  Fever and cough for several weeks. History of carcinoma of unknown primary. Right arm swelling starting last night. EXAM: CT CHEST, ABDOMEN, AND PELVIS WITH CONTRAST TECHNIQUE: Multidetector CT imaging of the chest, abdomen and pelvis was performed following the standard protocol during bolus administration of intravenous contrast. CONTRAST:  129mL ISOVUE-300 IOPAMIDOL (ISOVUE-300) INJECTION 61% COMPARISON:  01/29/2017 FINDINGS: CT CHEST FINDINGS Cardiovascular: Heart size upper normal. No pericardial effusion. Right PICC line tip is positioned at the SVC/RA junction. Mediastinum/Nodes: Scattered small to borderline enlarged mediastinal and hilar lymph nodes are noted bilaterally. There is no hilar lymphadenopathy. The esophagus has normal imaging features. Lungs/Pleura: Small bilateral pleural effusions are new in the interval. 12 mm posterior right upper lobe pleural-based nodule has progressed since prior study. Innumerable ill-defined and irregular pulmonary nodules are again identified. 10 mm left upper lobe nodule image 31 series 4 was 10 mm on the prior study. 13 mm right middle lobe nodule on today's study was 16 mm on the prior study. An 8 mm nodule seen in the lingula (image 84 series 4) was 8 mm previously. There is bilateral dependent collapse/consolidation. Musculoskeletal: Bone windows reveal no worrisome lytic or sclerotic osseous lesions. CT ABDOMEN PELVIS  FINDINGS Hepatobiliary: Multiple hepatic lesions are again identified. 3.5 cm lesion in the medial segment left liver was 2.8 cm on the prior study and is more necrotic on today's exam. 2.6 cm lesion in the lateral segment of the left liver was 2.6 cm previously. 3.5 cm lesion inferior aspect of the medial segment left liver, adjacent to the gallbladder measured only 1.6 cm on the prior study. Multiple new liver lesions are seen in the dome of the right liver (compare image 40 series 2 today  to image 15 series 11 previously. Gallbladder wall thickening or pericholecystic fluid noted. No intrahepatic or extrahepatic biliary dilation. Pancreas: No focal mass lesion. No dilatation of the main duct. No intraparenchymal cyst. No peripancreatic edema. Spleen: No splenomegaly. No focal mass lesion. Adrenals/Urinary Tract: No adrenal nodule or mass. Right kidney unremarkable. Asymmetrically decreased perfusion noted to the left kidney and there is mild left hydroureteronephrosis. Left ureter remains dilated into the pelvis where abnormal soft tissue is identified along the left pelvic sidewall. Stomach/Bowel: Stomach is nondistended. No gastric wall thickening. No evidence of outlet obstruction. Duodenum is normally positioned as is the ligament of Treitz. No small bowel wall thickening. No small bowel dilatation. The terminal ileum is normal. The appendix is normal. No gross colonic mass. No colonic wall thickening. No substantial diverticular change. Left lower quadrant sigmoid end colostomy noted. Vascular/Lymphatic: No abdominal aortic aneurysm. Mild hepatoduodenal ligament lymphadenopathy appears progressed in the interval. Necrotic left periaortic lymph node just caudal to the left renal vein is 15 mm short axis today which compares to 14 mm previously. A second 13 mm left para-aortic lymph node seen on image 66 was 8 mm previously. Reproductive: Irregular and heterogeneous soft tissue again incorporates the uterus as  seen on multiple prior studies. Neoplastic involvement is a distinct consideration in this process extends to the sidewall of the pelvis bilaterally in accounts for the evolving left hydroureteronephrosis. Other: Small volume intraperitoneal free fluid. Musculoskeletal: Bone windows reveal no worrisome lytic or sclerotic osseous lesions. IMPRESSION: 1. Aside from the presence of the right-sided PICC line, no IV 6 bone a shin for the patient's reported history of acute onset right upper extremity swelling. There may be some thrombus along the PICC line in the subclavian vein, but this is not well assessed because of the streak artifact from the PICC line itself. 2. Clear interval progression of some of the hepatic metastases. 3. The innumerable irregular pulmonary show no clear trend towards progression or regression. There is a new pleural-based nodule in the right apex and other nodules measure minimally larger and smaller since prior study. 4. There is new bibasilar collapse/consolidation with new small bilateral pleural effusions. 5. New mild lymphadenopathy in the hepatoduodenal ligament with stable to progression of para-aortic upper abdominal lymphadenopathy. 6. Similar irregular soft tissue in the pelvis involving the uterus with extension to the sidewalls bilaterally. Incorporation of the distal left ureter on the left results and mild left hydroureteronephrosis and decreased perfusion to the left kidney on today's study is consistent with a component of associated obstructive uropathy. Electronically Signed   By: Misty Stanley M.D.   On: 02/27/2017 18:06   Ct Abdomen Pelvis W Contrast  Result Date: 02/27/2017 CLINICAL DATA:  Fever and cough for several weeks. History of carcinoma of unknown primary. Right arm swelling starting last night. EXAM: CT CHEST, ABDOMEN, AND PELVIS WITH CONTRAST TECHNIQUE: Multidetector CT imaging of the chest, abdomen and pelvis was performed following the standard protocol  during bolus administration of intravenous contrast. CONTRAST:  188mL ISOVUE-300 IOPAMIDOL (ISOVUE-300) INJECTION 61% COMPARISON:  01/29/2017 FINDINGS: CT CHEST FINDINGS Cardiovascular: Heart size upper normal. No pericardial effusion. Right PICC line tip is positioned at the SVC/RA junction. Mediastinum/Nodes: Scattered small to borderline enlarged mediastinal and hilar lymph nodes are noted bilaterally. There is no hilar lymphadenopathy. The esophagus has normal imaging features. Lungs/Pleura: Small bilateral pleural effusions are new in the interval. 12 mm posterior right upper lobe pleural-based nodule has progressed since prior study. Innumerable ill-defined and irregular  pulmonary nodules are again identified. 10 mm left upper lobe nodule image 31 series 4 was 10 mm on the prior study. 13 mm right middle lobe nodule on today's study was 16 mm on the prior study. An 8 mm nodule seen in the lingula (image 84 series 4) was 8 mm previously. There is bilateral dependent collapse/consolidation. Musculoskeletal: Bone windows reveal no worrisome lytic or sclerotic osseous lesions. CT ABDOMEN PELVIS FINDINGS Hepatobiliary: Multiple hepatic lesions are again identified. 3.5 cm lesion in the medial segment left liver was 2.8 cm on the prior study and is more necrotic on today's exam. 2.6 cm lesion in the lateral segment of the left liver was 2.6 cm previously. 3.5 cm lesion inferior aspect of the medial segment left liver, adjacent to the gallbladder measured only 1.6 cm on the prior study. Multiple new liver lesions are seen in the dome of the right liver (compare image 40 series 2 today to image 15 series 11 previously. Gallbladder wall thickening or pericholecystic fluid noted. No intrahepatic or extrahepatic biliary dilation. Pancreas: No focal mass lesion. No dilatation of the main duct. No intraparenchymal cyst. No peripancreatic edema. Spleen: No splenomegaly. No focal mass lesion. Adrenals/Urinary Tract: No  adrenal nodule or mass. Right kidney unremarkable. Asymmetrically decreased perfusion noted to the left kidney and there is mild left hydroureteronephrosis. Left ureter remains dilated into the pelvis where abnormal soft tissue is identified along the left pelvic sidewall. Stomach/Bowel: Stomach is nondistended. No gastric wall thickening. No evidence of outlet obstruction. Duodenum is normally positioned as is the ligament of Treitz. No small bowel wall thickening. No small bowel dilatation. The terminal ileum is normal. The appendix is normal. No gross colonic mass. No colonic wall thickening. No substantial diverticular change. Left lower quadrant sigmoid end colostomy noted. Vascular/Lymphatic: No abdominal aortic aneurysm. Mild hepatoduodenal ligament lymphadenopathy appears progressed in the interval. Necrotic left periaortic lymph node just caudal to the left renal vein is 15 mm short axis today which compares to 14 mm previously. A second 13 mm left para-aortic lymph node seen on image 66 was 8 mm previously. Reproductive: Irregular and heterogeneous soft tissue again incorporates the uterus as seen on multiple prior studies. Neoplastic involvement is a distinct consideration in this process extends to the sidewall of the pelvis bilaterally in accounts for the evolving left hydroureteronephrosis. Other: Small volume intraperitoneal free fluid. Musculoskeletal: Bone windows reveal no worrisome lytic or sclerotic osseous lesions. IMPRESSION: 1. Aside from the presence of the right-sided PICC line, no IV 6 bone a shin for the patient's reported history of acute onset right upper extremity swelling. There may be some thrombus along the PICC line in the subclavian vein, but this is not well assessed because of the streak artifact from the PICC line itself. 2. Clear interval progression of some of the hepatic metastases. 3. The innumerable irregular pulmonary show no clear trend towards progression or regression.  There is a new pleural-based nodule in the right apex and other nodules measure minimally larger and smaller since prior study. 4. There is new bibasilar collapse/consolidation with new small bilateral pleural effusions. 5. New mild lymphadenopathy in the hepatoduodenal ligament with stable to progression of para-aortic upper abdominal lymphadenopathy. 6. Similar irregular soft tissue in the pelvis involving the uterus with extension to the sidewalls bilaterally. Incorporation of the distal left ureter on the left results and mild left hydroureteronephrosis and decreased perfusion to the left kidney on today's study is consistent with a component of associated obstructive uropathy.  Electronically Signed   By: Misty Stanley M.D.   On: 02/27/2017 18:06   US Venous Img Upper Uni Right  Result Date: 02/27/2017 CLINICAL DATA:  Right upper extremity edema. EXAM: RIGHT UPPER EXTREMITY VENOUS DOPPLER ULTRASOUND TECHNIQUE: Gray-scale sonography with graded compression, as well as color Doppler and duplex ultrasound were performed to evaluate the upper extremity deep venous system from the level of the subclavian vein and including the jugular, axillary, basilic, radial, ulnar and upper cephalic vein. Spectral Doppler was utilized to evaluate flow at rest and with distal augmentation maneuvers. COMPARISON:  None. FINDINGS: Contralateral Subclavian Vein: Respiratory phasicity is normal and symmetric with the symptomatic side. No evidence of thrombus. Normal compressibility. Internal Jugular Vein: No evidence of thrombus. Normal compressibility, respiratory phasicity and response to augmentation. Subclavian Vein: There is a nonocclusive thrombus within the distal subclavian vein, extending into the axillary vein and brachial vein branch. Cephalic Vein: No evidence of thrombus. Normal compressibility, respiratory phasicity and response to augmentation. Basilic Vein: Partially visualized due to bandage obscuring it. No  evidence of thrombus where visualized. Normal compressibility, respiratory phasicity and response to augmentation. Radial Veins: No evidence of thrombus. Normal compressibility, respiratory phasicity and response to augmentation. Ulnar Veins: No evidence of thrombus. Normal compressibility, respiratory phasicity and response to augmentation. Venous Reflux:  None visualized. Other Findings: PICC line originating within the brachials vein branch associated with partially occlusive thrombus. 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 These results will be called to the ordering clinician or representative by the Radiologist Assistant, and communication documented in the PACS or zVision Dashboard. Electronically Signed   By: Fidela Salisbury M.D.   On: 02/27/2017 18:11   Dg Chest Port 1 View  Result Date: 02/28/2017 CLINICAL DATA:  PICC line placement EXAM: PORTABLE CHEST 1 VIEW COMPARISON:  Portable exam 1230 hours compared to 02/27/2017 FINDINGS: LEFT arm PICC line tip projects over RIGHT atrium; recommend withdrawal 3.0 cm to place tip in SVC near the cavoatrial junction. Normal heart size, mediastinal contours and pulmonary vascularity for technique. Bibasilar effusions and atelectasis greater on RIGHT. No pneumothorax. Bones demineralized. IMPRESSION: Recommend withdrawal of LEFT arm PICC line 3.0 cm to place tip in SVC near the cavoatrial junction. Bibasilar atelectasis and pleural effusions greater on RIGHT. Findings called to Idamae Lusher RN on 02/28/2017 at 1243 hrs. Electronically Signed   By: Lavonia Dana M.D.   On: 02/28/2017 12:44    CT films and reports and labs reviewed.       LOS: 1 day    Malka So 03/01/2017 [phone] ID: Bertis Ruddy, female   DOB: 04/23/1966, 51 y.o.   MRN: [id number]

## 2017-03-01 NOTE — Progress Notes (Signed)
Patient ID: Anna [name], female   DOB: Mar 07, 1966, 51 y.o.   MRN: [id number]  [employer] PROGRESS NOTE  Sadye Kiernan [facility] GBT:[id number] DOB: [birth year] DOA: 02/28/2017 PCP: McLean-Scocuzza, Nino Glow, MD  HPI/Subjective: Patient feeling okay. Had fever as outpatient. Has increasing white count as outpatient.  Objective: Vitals:   03/01/17 0923 03/01/17 1241  BP: 102/63 105/69  Pulse: (!) 113 (!) 117  Resp:  18  Temp:  98.7 F (37.1 C)    Filed Weights   02/28/17 1659  Weight: 57.3 kg (126 lb 6.4 oz)    ROS: Review of Systems  Constitutional: Negative for chills and fever.  Eyes: Negative for blurred vision.  Respiratory: Negative for cough and shortness of breath.   Cardiovascular: Negative for chest pain.  Gastrointestinal: Negative for abdominal pain, constipation, diarrhea, nausea and vomiting.  Genitourinary: Negative for dysuria.  Musculoskeletal: Negative for joint pain.  Neurological: Negative for dizziness and headaches.   Exam: Physical Exam  HENT:  Nose: No mucosal edema.  Mouth/Throat: No oropharyngeal exudate or posterior oropharyngeal edema.  Eyes: Conjunctivae, EOM and lids are normal. Pupils are equal, round, and reactive to light.  Neck: No JVD present. Carotid bruit is not present. No edema present. No thyroid mass and no thyromegaly present.  Cardiovascular: S1 normal and S2 normal.  Tachycardia present.  Exam reveals no gallop.   No murmur heard. Pulses:      Dorsalis pedis pulses are 2+ on the right side, and 2+ on the left side.  Respiratory: No respiratory distress. She has no wheezes. She has no rhonchi. She has no rales.  GI: Soft. Bowel sounds are normal. There is no tenderness.  Musculoskeletal:       Right ankle: She exhibits no swelling.       Left ankle: She exhibits no swelling.  Lymphadenopathy:    She has no cervical adenopathy.  Neurological: She is alert. No cranial nerve deficit.  Skin: Skin is warm. No rash noted.  Nails show no clubbing.  Psychiatric: She has a normal mood and affect.      Data Reviewed: Basic Metabolic Panel:  Recent Labs Lab 02/27/17 1053 02/28/17 1825 03/01/17 0452  NA 133* 133* 134*  K 2.6* 3.9 3.7  CL 95* 97* 99*  CO2 30 27 29   GLUCOSE 154* 153* 92  BUN 7 8 7   CREATININE 0.71 0.70 0.49  CALCIUM 7.8* 7.9* 7.6*  MG  --  1.7  --    Liver Function Tests:  Recent Labs Lab 02/27/17 1053 02/28/17 1825  AST 41 39  ALT 30 29  ALKPHOS 141* 143*  BILITOT 0.5 0.5  PROT 5.2* 5.1*  ALBUMIN 1.9* 1.8*   CBC:  Recent Labs Lab 02/27/17 1053 02/28/17 1825 03/01/17 0452  WBC 52.6* 56.1* 50.2*  NEUTROABS 50.2*  --   --   HGB 7.8* 8.0* 7.1*  HCT 24.2* 25.8* 22.3*  MCV 83.0 85.9 83.7  PLT 719* 750* 690*     Recent Results (from the past 240 hour(s))  Culture, blood (routine x 2)     Status: None (Preliminary result)   Collection Time: 02/27/17 12:45 PM  Result Value Ref Range Status   Specimen Description BLOOD LEFT HAND  Final   Special Requests   Final    BOTTLES DRAWN AEROBIC AND ANAEROBIC 8 CC AEROBIC, 6CC ANAEROBIC   Culture NO GROWTH 2 DAYS  Final   Report Status PENDING  Incomplete  Culture, blood (routine x 2)  Status: None (Preliminary result)   Collection Time: 02/27/17 12:50 PM  Result Value Ref Range Status   Specimen Description BLOOD RIGHT ARM  Final   Special Requests   Final    BOTTLES DRAWN AEROBIC AND ANAEROBIC 10 CC AEROBIC, 9 CC ANAEROBIC   Culture NO GROWTH 2 DAYS  Final   Report Status PENDING  Incomplete  Urine culture     Status: None   Collection Time: 02/28/17  1:15 PM  Result Value Ref Range Status   Specimen Description URINE, CLEAN CATCH  Final   Special Requests NONE  Final   Culture   Final    NO GROWTH Performed at [facility], [place] [address] [place], [place] [address]    Report Status 03/01/2017 FINAL  Final  C difficile quick scan w PCR reflex     Status: Abnormal   Collection Time: 03/01/17  1:47 PM   Result Value Ref Range Status   C Diff antigen POSITIVE (A) NEGATIVE Final   C Diff toxin NEGATIVE NEGATIVE Final   C Diff interpretation Results are indeterminate. See PCR results.  Final     Studies: Ct Chest W Contrast  Result Date: 02/27/2017 CLINICAL DATA:  Fever and cough for several weeks. History of carcinoma of unknown primary. Right arm swelling starting last night. EXAM: CT CHEST, ABDOMEN, AND PELVIS WITH CONTRAST TECHNIQUE: Multidetector CT imaging of the chest, abdomen and pelvis was performed following the standard protocol during bolus administration of intravenous contrast. CONTRAST:  131mL ISOVUE-300 IOPAMIDOL (ISOVUE-300) INJECTION 61% COMPARISON:  01/29/2017 FINDINGS: CT CHEST FINDINGS Cardiovascular: Heart size upper normal. No pericardial effusion. Right PICC line tip is positioned at the SVC/RA junction. Mediastinum/Nodes: Scattered small to borderline enlarged mediastinal and hilar lymph nodes are noted bilaterally. There is no hilar lymphadenopathy. The esophagus has normal imaging features. Lungs/Pleura: Small bilateral pleural effusions are new in the interval. 12 mm posterior right upper lobe pleural-based nodule has progressed since prior study. Innumerable ill-defined and irregular pulmonary nodules are again identified. 10 mm left upper lobe nodule image 31 series 4 was 10 mm on the prior study. 13 mm right middle lobe nodule on today's study was 16 mm on the prior study. An 8 mm nodule seen in the lingula (image 84 series 4) was 8 mm previously. There is bilateral dependent collapse/consolidation. Musculoskeletal: Bone windows reveal no worrisome lytic or sclerotic osseous lesions. CT ABDOMEN PELVIS FINDINGS Hepatobiliary: Multiple hepatic lesions are again identified. 3.5 cm lesion in the medial segment left liver was 2.8 cm on the prior study and is more necrotic on today's exam. 2.6 cm lesion in the lateral segment of the left liver was 2.6 cm previously. 3.5 cm lesion  inferior aspect of the medial segment left liver, adjacent to the gallbladder measured only 1.6 cm on the prior study. Multiple new liver lesions are seen in the dome of the right liver (compare image 40 series 2 today to image 15 series 11 previously. Gallbladder wall thickening or pericholecystic fluid noted. No intrahepatic or extrahepatic biliary dilation. Pancreas: No focal mass lesion. No dilatation of the main duct. No intraparenchymal cyst. No peripancreatic edema. Spleen: No splenomegaly. No focal mass lesion. Adrenals/Urinary Tract: No adrenal nodule or mass. Right kidney unremarkable. Asymmetrically decreased perfusion noted to the left kidney and there is mild left hydroureteronephrosis. Left ureter remains dilated into the pelvis where abnormal soft tissue is identified along the left pelvic sidewall. Stomach/Bowel: Stomach is nondistended. No gastric wall thickening. No evidence  of outlet obstruction. Duodenum is normally positioned as is the ligament of Treitz. No small bowel wall thickening. No small bowel dilatation. The terminal ileum is normal. The appendix is normal. No gross colonic mass. No colonic wall thickening. No substantial diverticular change. Left lower quadrant sigmoid end colostomy noted. Vascular/Lymphatic: No abdominal aortic aneurysm. Mild hepatoduodenal ligament lymphadenopathy appears progressed in the interval. Necrotic left periaortic lymph node just caudal to the left renal vein is 15 mm short axis today which compares to 14 mm previously. A second 13 mm left para-aortic lymph node seen on image 66 was 8 mm previously. Reproductive: Irregular and heterogeneous soft tissue again incorporates the uterus as seen on multiple prior studies. Neoplastic involvement is a distinct consideration in this process extends to the sidewall of the pelvis bilaterally in accounts for the evolving left hydroureteronephrosis. Other: Small volume intraperitoneal free fluid. Musculoskeletal: Bone  windows reveal no worrisome lytic or sclerotic osseous lesions. IMPRESSION: 1. Aside from the presence of the right-sided PICC line, no IV 6 bone a shin for the patient's reported history of acute onset right upper extremity swelling. There may be some thrombus along the PICC line in the subclavian vein, but this is not well assessed because of the streak artifact from the PICC line itself. 2. Clear interval progression of some of the hepatic metastases. 3. The innumerable irregular pulmonary show no clear trend towards progression or regression. There is a new pleural-based nodule in the right apex and other nodules measure minimally larger and smaller since prior study. 4. There is new bibasilar collapse/consolidation with new small bilateral pleural effusions. 5. New mild lymphadenopathy in the hepatoduodenal ligament with stable to progression of para-aortic upper abdominal lymphadenopathy. 6. Similar irregular soft tissue in the pelvis involving the uterus with extension to the sidewalls bilaterally. Incorporation of the distal left ureter on the left results and mild left hydroureteronephrosis and decreased perfusion to the left kidney on today's study is consistent with a component of associated obstructive uropathy. Electronically Signed   By: Misty Stanley M.D.   On: 02/27/2017 18:06   Ct Abdomen Pelvis W Contrast  Result Date: 02/27/2017 CLINICAL DATA:  Fever and cough for several weeks. History of carcinoma of unknown primary. Right arm swelling starting last night. EXAM: CT CHEST, ABDOMEN, AND PELVIS WITH CONTRAST TECHNIQUE: Multidetector CT imaging of the chest, abdomen and pelvis was performed following the standard protocol during bolus administration of intravenous contrast. CONTRAST:  14mL ISOVUE-300 IOPAMIDOL (ISOVUE-300) INJECTION 61% COMPARISON:  01/29/2017 FINDINGS: CT CHEST FINDINGS Cardiovascular: Heart size upper normal. No pericardial effusion. Right PICC line tip is positioned at the  SVC/RA junction. Mediastinum/Nodes: Scattered small to borderline enlarged mediastinal and hilar lymph nodes are noted bilaterally. There is no hilar lymphadenopathy. The esophagus has normal imaging features. Lungs/Pleura: Small bilateral pleural effusions are new in the interval. 12 mm posterior right upper lobe pleural-based nodule has progressed since prior study. Innumerable ill-defined and irregular pulmonary nodules are again identified. 10 mm left upper lobe nodule image 31 series 4 was 10 mm on the prior study. 13 mm right middle lobe nodule on today's study was 16 mm on the prior study. An 8 mm nodule seen in the lingula (image 84 series 4) was 8 mm previously. There is bilateral dependent collapse/consolidation. Musculoskeletal: Bone windows reveal no worrisome lytic or sclerotic osseous lesions. CT ABDOMEN PELVIS FINDINGS Hepatobiliary: Multiple hepatic lesions are again identified. 3.5 cm lesion in the medial segment left liver was 2.8 cm on the  prior study and is more necrotic on today's exam. 2.6 cm lesion in the lateral segment of the left liver was 2.6 cm previously. 3.5 cm lesion inferior aspect of the medial segment left liver, adjacent to the gallbladder measured only 1.6 cm on the prior study. Multiple new liver lesions are seen in the dome of the right liver (compare image 40 series 2 today to image 15 series 11 previously. Gallbladder wall thickening or pericholecystic fluid noted. No intrahepatic or extrahepatic biliary dilation. Pancreas: No focal mass lesion. No dilatation of the main duct. No intraparenchymal cyst. No peripancreatic edema. Spleen: No splenomegaly. No focal mass lesion. Adrenals/Urinary Tract: No adrenal nodule or mass. Right kidney unremarkable. Asymmetrically decreased perfusion noted to the left kidney and there is mild left hydroureteronephrosis. Left ureter remains dilated into the pelvis where abnormal soft tissue is identified along the left pelvic sidewall.  Stomach/Bowel: Stomach is nondistended. No gastric wall thickening. No evidence of outlet obstruction. Duodenum is normally positioned as is the ligament of Treitz. No small bowel wall thickening. No small bowel dilatation. The terminal ileum is normal. The appendix is normal. No gross colonic mass. No colonic wall thickening. No substantial diverticular change. Left lower quadrant sigmoid end colostomy noted. Vascular/Lymphatic: No abdominal aortic aneurysm. Mild hepatoduodenal ligament lymphadenopathy appears progressed in the interval. Necrotic left periaortic lymph node just caudal to the left renal vein is 15 mm short axis today which compares to 14 mm previously. A second 13 mm left para-aortic lymph node seen on image 66 was 8 mm previously. Reproductive: Irregular and heterogeneous soft tissue again incorporates the uterus as seen on multiple prior studies. Neoplastic involvement is a distinct consideration in this process extends to the sidewall of the pelvis bilaterally in accounts for the evolving left hydroureteronephrosis. Other: Small volume intraperitoneal free fluid. Musculoskeletal: Bone windows reveal no worrisome lytic or sclerotic osseous lesions. IMPRESSION: 1. Aside from the presence of the right-sided PICC line, no IV 6 bone a shin for the patient's reported history of acute onset right upper extremity swelling. There may be some thrombus along the PICC line in the subclavian vein, but this is not well assessed because of the streak artifact from the PICC line itself. 2. Clear interval progression of some of the hepatic metastases. 3. The innumerable irregular pulmonary show no clear trend towards progression or regression. There is a new pleural-based nodule in the right apex and other nodules measure minimally larger and smaller since prior study. 4. There is new bibasilar collapse/consolidation with new small bilateral pleural effusions. 5. New mild lymphadenopathy in the hepatoduodenal  ligament with stable to progression of para-aortic upper abdominal lymphadenopathy. 6. Similar irregular soft tissue in the pelvis involving the uterus with extension to the sidewalls bilaterally. Incorporation of the distal left ureter on the left results and mild left hydroureteronephrosis and decreased perfusion to the left kidney on today's study is consistent with a component of associated obstructive uropathy. Electronically Signed   By: Misty Stanley M.D.   On: 02/27/2017 18:06   US Venous Img Upper Uni Right  Result Date: 02/27/2017 CLINICAL DATA:  Right upper extremity edema. EXAM: RIGHT UPPER EXTREMITY VENOUS DOPPLER ULTRASOUND TECHNIQUE: Gray-scale sonography with graded compression, as well as color Doppler and duplex ultrasound were performed to evaluate the upper extremity deep venous system from the level of the subclavian vein and including the jugular, axillary, basilic, radial, ulnar and upper cephalic vein. Spectral Doppler was utilized to evaluate flow at rest and with distal  augmentation maneuvers. COMPARISON:  None. FINDINGS: Contralateral Subclavian Vein: Respiratory phasicity is normal and symmetric with the symptomatic side. No evidence of thrombus. Normal compressibility. Internal Jugular Vein: No evidence of thrombus. Normal compressibility, respiratory phasicity and response to augmentation. Subclavian Vein: There is a nonocclusive thrombus within the distal subclavian vein, extending into the axillary vein and brachial vein branch. Cephalic Vein: No evidence of thrombus. Normal compressibility, respiratory phasicity and response to augmentation. Basilic Vein: Partially visualized due to bandage obscuring it. No evidence of thrombus where visualized. Normal compressibility, respiratory phasicity and response to augmentation. Radial Veins: No evidence of thrombus. Normal compressibility, respiratory phasicity and response to augmentation. Ulnar Veins: No evidence of thrombus. Normal  compressibility, respiratory phasicity and response to augmentation. Venous Reflux:  None visualized. Other Findings: PICC line originating within the brachials vein branch associated with partially occlusive thrombus. 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 These results will be called to the ordering clinician or representative by the Radiologist Assistant, and communication documented in the PACS or z[employer]ision Dashboard. Electronically Signed   By: Fidela Salisbury M.D.   On: 02/27/2017 18:11   Dg Chest Port 1 View  Result Date: 02/28/2017 CLINICAL DATA:  PICC line placement EXAM: PORTABLE CHEST 1 VIEW COMPARISON:  Portable exam 1230 hours compared to 02/27/2017 FINDINGS: LEFT arm PICC line tip projects over RIGHT atrium; recommend withdrawal 3.0 cm to place tip in SVC near the cavoatrial junction. Normal heart size, mediastinal contours and pulmonary vascularity for technique. Bibasilar effusions and atelectasis greater on RIGHT. No pneumothorax. Bones demineralized. IMPRESSION: Recommend withdrawal of LEFT arm PICC line 3.0 cm to place tip in SVC near the cavoatrial junction. Bibasilar atelectasis and pleural effusions greater on RIGHT. Findings called to Idamae Lusher RN on 02/28/2017 at 1243 hrs. Electronically Signed   By: Lavonia Dana M.D.   On: 02/28/2017 12:44    Scheduled Meds: . apixaban  5 mg Oral BID  . diltiazem  240 mg Oral QHS  . ferrous sulfate  325 mg Oral Q breakfast  . metoprolol succinate  50 mg Oral Daily   Continuous Infusions: . sodium chloride    . fluconazole (DIFLUCAN) IV Stopped (02/28/17 2053)  . meropenem (MERREM) 1 GM IVPB Stopped (03/01/17 1005)  . vancomycin Stopped (03/01/17 0557)    Assessment/Plan:  1. Fever and leukocytosis. Patient on meropenem and vancomycin and fluconazole. Patient not having diarrhea at this time. So far cultures  are negative. Monitor on aggressive antibiotics through the weekend and reevaluate. This could also be due to her cancer. White count on the last time of discharge also very high. 2. Tachycardia. On metoprolol and Cardizem 3. Left hydronephrosis. Seen by urology 4. Metastatic carcinoma of unknown primary. Follow-up with oncology as outpatient. Overall prognosis is poor 5. DVT on Eliquis 6. Wound VAC for abdomen  Code Status:     Code Status Orders        Start     Ordered   02/28/17 1647  Full code  Continuous     02/28/17 1647    Code Status History    Date Active Date Inactive Code Status Order ID Comments User Context   01/30/2017 12:09 AM 02/04/2017 10:43 PM Full Code [id number]  [place], Ubaldo Glassing, DO Inpatient   01/21/2017  9:00 PM 01/24/2017  5:38 PM Full Code [id number]  Fritzi Mandes, MD Inpatient   12/31/2016  3:58 AM 01/10/2017  4:29 PM Full Code [id number]  Jules Husbands, MD Inpatient   12/30/2016 10:48 PM 12/31/2016  3:58 AM Full Code [id number]  Schermerhorn, Gwen Her, MD ED   12/20/2016 11:16 AM 12/21/2016  1:51 PM Full Code [id number]  Schermerhorn, Gwen Her, MD Inpatient     Family Communication: Husband at the bedside Disposition Plan: To be determined based on clinical course  Consultants:  Urology  Infectious disease  Antibiotics:  Vancomycin  Meropenem  Diflucan  Time spent: 28 minutes  [place], [place]

## 2017-03-02 DIAGNOSIS — Z86711 Personal history of pulmonary embolism: Secondary | ICD-10-CM

## 2017-03-02 DIAGNOSIS — R531 Weakness: Secondary | ICD-10-CM

## 2017-03-02 DIAGNOSIS — Z86010 Personal history of colonic polyps: Secondary | ICD-10-CM

## 2017-03-02 DIAGNOSIS — Z800 Family history of malignant neoplasm of digestive organs: Secondary | ICD-10-CM

## 2017-03-02 DIAGNOSIS — Z8719 Personal history of other diseases of the digestive system: Secondary | ICD-10-CM

## 2017-03-02 DIAGNOSIS — Z79899 Other long term (current) drug therapy: Secondary | ICD-10-CM

## 2017-03-02 DIAGNOSIS — Z86718 Personal history of other venous thrombosis and embolism: Secondary | ICD-10-CM

## 2017-03-02 DIAGNOSIS — Z7901 Long term (current) use of anticoagulants: Secondary | ICD-10-CM

## 2017-03-02 DIAGNOSIS — K449 Diaphragmatic hernia without obstruction or gangrene: Secondary | ICD-10-CM

## 2017-03-02 DIAGNOSIS — R109 Unspecified abdominal pain: Secondary | ICD-10-CM

## 2017-03-02 DIAGNOSIS — R5383 Other fatigue: Secondary | ICD-10-CM

## 2017-03-02 DIAGNOSIS — N1330 Unspecified hydronephrosis: Secondary | ICD-10-CM

## 2017-03-02 DIAGNOSIS — D259 Leiomyoma of uterus, unspecified: Secondary | ICD-10-CM

## 2017-03-02 DIAGNOSIS — C801 Malignant (primary) neoplasm, unspecified: Secondary | ICD-10-CM

## 2017-03-02 DIAGNOSIS — R509 Fever, unspecified: Secondary | ICD-10-CM

## 2017-03-02 LAB — VANCOMYCIN, TROUGH: VANCOMYCIN TR: 15 ug/mL (ref 15–20)

## 2017-03-02 MED ORDER — ENSURE ENLIVE PO LIQD
237.0000 mL | Freq: Two times a day (BID) | ORAL | Status: DC
  Administered 2017-03-02 – 2017-03-04 (×4): 237 mL via ORAL

## 2017-03-02 NOTE — Progress Notes (Signed)
Physical Therapy Evaluation Patient Details Name: Anna [name] MRN: [id number] DOB: 1966-05-08 Today's Date: 03/02/2017   History of Present Illness  Patient is a 51 y.o. female admitted on [place] after being referred from Dr. Bradd Canary for fever/tachycardia. Patient has carcinoma of unknown primary and is on palliative care.   Clinical Impression  Patient is a pleasant female admitted for above listed reasons. Patient previously independent with mobility at home with assistance from husband for grocery shopping/cooking. Patient on evaluation demonstrates independence with bed mobility, transfers, and gait. Of note, her oxygen saturations dropped from mid-90s to 86% after 10' of ambulation. HR remained tachycardic, as it has been since admission. RN notified of oxygen saturations. Patient is otherwise at baseline level of functioning and does not require PT f/u upon d/c when medically ready.    Follow Up Recommendations No PT follow up    Equipment Recommendations  None recommended by PT    Recommendations for Other Services       Precautions / Restrictions Precautions Precautions: Fall Restrictions Weight Bearing Restrictions: No      Mobility  Bed Mobility Overal bed mobility: Independent             General bed mobility comments: Patient performs bed mobility independently.  Transfers Overall transfer level: Independent Equipment used: None             General transfer comment: Patient moves from sit to stand and stand to sit with good safety awareness.  Ambulation/Gait Ambulation/Gait assistance: Independent Ambulation Distance (Feet): 20 Feet Assistive device: None       General Gait Details: Patient ambulates with non-antalgic gait pattern. HR increased from 104 bpm to 112 bpm in 10'. O2 saturations dropped from mid 90s to 86%.   Stairs            Wheelchair Mobility    Modified Rankin (Stroke Patients Only)       Balance Overall  balance assessment: Independent                                           Pertinent Vitals/Pain Pain Assessment: No/denies pain    Home Living Family/patient expects to be discharged to:: Private residence Living Arrangements: Spouse/significant other Available Help at Discharge: Family;Available 24 hours/day Type of Home: House Home Access: Stairs to enter Entrance Stairs-Rails: None Entrance Stairs-Number of Steps: 3 Home Layout: One level Home Equipment: None      Prior Function Level of Independence: Independent;Needs assistance   Gait / Transfers Assistance Needed: Performed independently  ADL's / Homemaking Assistance Needed: Performed mostly by husband        Hand Dominance        Extremity/Trunk Assessment   Upper Extremity Assessment Upper Extremity Assessment: Generalized weakness    Lower Extremity Assessment Lower Extremity Assessment: Generalized weakness       Communication   Communication: No difficulties  Cognition Arousal/Alertness: Awake/alert Behavior During Therapy: WFL for tasks assessed/performed Overall Cognitive Status: Within Functional Limits for tasks assessed                                        General Comments      Exercises     Assessment/Plan    PT Assessment Patent does not need any further PT services  PT Problem List         PT Treatment Interventions      PT Goals (Current goals can be found in the Care Plan section)  Acute Rehab PT Goals Patient Stated Goal: "To feel better" PT Goal Formulation: With patient/family Time For Goal Achievement: 03/16/17 Potential to Achieve Goals: Good    Frequency     Barriers to discharge        Co-evaluation               AM-PAC PT "6 Clicks" Daily Activity  Outcome Measure Difficulty turning over in bed (including adjusting bedclothes, sheets and blankets)?: None Difficulty moving from lying on back to sitting on the side  of the bed? : None Difficulty sitting down on and standing up from a chair with arms (e.g., wheelchair, bedside commode, etc,.)?: None Help needed moving to and from a bed to chair (including a wheelchair)?: None Help needed walking in hospital room?: None Help needed climbing 3-5 steps with a railing? : None 6 Click Score: 24    End of Session Equipment Utilized During Treatment: Gait belt Activity Tolerance: Patient tolerated treatment well;Treatment limited secondary to medical complications (Comment);Other (comment) (HR/oxygen levels) Patient left: in bed;with call bell/phone within reach;with bed alarm set;with family/visitor present Nurse Communication: Other (comment) (Oxygen saturations) PT Visit Diagnosis: Muscle weakness (generalized) (M62.81)    Time: 3762-8315 PT Time Calculation (min) (ACUTE ONLY): 14 min   Charges:   PT Evaluation $PT Eval Low Complexity: 1 Procedure     PT G Codes:          Dorice Lamas, PT, DPT 03/02/2017, 1:18 PM

## 2017-03-02 NOTE — Progress Notes (Signed)
Initial Nutrition Assessment  DOCUMENTATION CODES:   Non-severe (moderate) malnutrition in context of chronic illness  INTERVENTION:  Provide Ensure Enlive po BID, each supplement provides 350 kcal and 20 grams of protein.   Encouraged patient to try blending Premier Protein at home with ice cream for added calories or to try a drink such as Ensure Enlive that is high in protein and calories.  Reviewed "High Calorie, High Protein Nutrition Therapy" from the Academy of [employer] and Dietetics. Encouraged small, frequent meals with protein- and calorie-dense foods. Discussed ways to add calories and protein to foods patient enjoys. Discussed patient's calorie and protein goals.  NUTRITION DIAGNOSIS:   Malnutrition (Moderate) related to chronic illness (metastatic adenocarcinoma of unknown primary site on [place]) as evidenced by 19.9 percent weight loss over 7 months, mild depletion of body fat, moderate depletion of body fat.  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 Pt presented with fever and leukocytosis, which could be related to metastatic disease, also has left hydronephrosis.    -Pt has Wound VAC over surgical wound on abdomen.  Spoke with patient at bedside. She is known to this RD from previous admission. She reports she continues to eat, but is eating smaller portions than she used to. She reports for breakfast she may have cereal with 2% milk. For lunch she may soup, a frozen chicken pot pie, or thin  crust pizza. For dinner she may eat out (cheeseburger or other meal), or eat at home and have food such as macaroni and cheese or similar to lunch. She reports she is not eating as much meat anymore. 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 Reports her colostomy output has been regular - usually only required one change per day, though. Denies any N/V or abdominal pain. No difficulty chewing/swallowing.  UBW was in the 140 lb range. Per chart patient was 145 lbs on 07/08/2016, 141 lbs on 11/21/2016, 136 lbs on 12/11/2016. Lowest weight was 116.2 lbs on 02/05/2017. She lost a total of 28.8 lbs (19.9% body weight) over 7 months, which is significant for time frame. Since 6/13 patient's weights in chart appear to be trending up and she is currently 126.4 lbs (current weight likely falsely elevated in setting of edema).   Medications reviewed and include: Eliquis, ferrous sulfate 325 mg daily, fluconazole, meropenem, vancomycin.  Labs reviewed: Sodium 134, Chloride 99, WBC 50.2, Platelets 690.   Nutrition-Focused physical exam completed. Findings are mild-moderate fat depletion (orbital and upper arm region), mild-moderate muscle depletion (temporal region), and moderate edema. Difficult to truly assess muscle status in setting of edema - may be more depleted than can assess on exam.  Discussed with RN.  Diet Order:  Diet regular Room service appropriate? Yes; Fluid consistency: Thin  Skin:  Wound (see comment) (surgical incision abdomen, Wound VAC to abdomen)  Last BM:  03/01/2017 - type 5 per colostomy  Height:   Ht Readings from Last 1 Encounters:  02/28/17 5\' 3"  (1.6  m)    Weight:   Wt Readings from Last 1 Encounters:  02/28/17 126 lb 6.4 oz (57.3 kg)    Ideal Body Weight:  52.3 kg  BMI:  Body mass index is 22.39 kg/m.  Estimated Nutritional Needs:   Kcal:  1615-1850 (MSJ x 1.4-1.6)  Protein:  75-85 grams (1.3-1.5 grams/kg)  Fluid:   1.7-2 L/day (30-35 ml/kg)  EDUCATION NEEDS:   Education needs addressed  Willey Blade, MS, RD, LDN Pager: [phone] After Hours Pager: [phone]

## 2017-03-02 NOTE — Progress Notes (Signed)
Patient ID: Anna [name], female   DOB: 06-10-1966, 51 y.o.   MRN: [id number]  [facility] PROGRESS NOTE  [name] XIH:[id number] DOB: 1966-04-30 DOA: 02/28/2017 PCP: McLean-Scocuzza, Nino Glow, MD  HPI/Subjective: Patient feels okay. Had a fever of 102 last night. Does not feel palpitations with her heart rate being fast. Some cough. Some abdominal discomfort.  Objective: Vitals:   03/01/17 2238 03/02/17 0429  BP: (!) 97/55 (!) 101/59  Pulse: (!) 115 (!) 106  Resp:  19  Temp:  99.6 F (37.6 C)    Filed Weights   02/28/17 1659  Weight: 57.3 kg (126 lb 6.4 oz)    ROS: Review of Systems  Constitutional: Negative for chills and fever.  Eyes: Negative for blurred vision.  Respiratory: Positive for cough. Negative for shortness of breath.   Cardiovascular: Negative for chest pain.  Gastrointestinal: Positive for abdominal pain. Negative for constipation, diarrhea, nausea and vomiting.  Genitourinary: Negative for dysuria.  Musculoskeletal: Negative for joint pain.  Neurological: Negative for dizziness and headaches.   Exam: Physical Exam  HENT:  Nose: No mucosal edema.  Mouth/Throat: No oropharyngeal exudate or posterior oropharyngeal edema.  Eyes: Conjunctivae, EOM and lids are normal. Pupils are equal, round, and reactive to light.  Neck: No JVD present. Carotid bruit is not present. No edema present. No thyroid mass and no thyromegaly present.  Cardiovascular: S1 normal and S2 normal.  Tachycardia present.  Exam reveals no gallop.   No murmur heard. Pulses:      Dorsalis pedis pulses are 2+ on the right side, and 2+ on the left side.  Respiratory: No respiratory distress. She has no wheezes. She has no rhonchi. She has no rales.  GI: Soft. Bowel sounds are normal. There is no tenderness.  Musculoskeletal:       Right ankle: She exhibits no swelling.       Left ankle: She exhibits no swelling.  Lymphadenopathy:    She has no cervical adenopathy.   Neurological: She is alert. No cranial nerve deficit.  Skin: Skin is warm. No rash noted. Nails show no clubbing.  Psychiatric: She has a normal mood and affect.      Data Reviewed: Basic Metabolic Panel:  Recent Labs Lab 02/27/17 1053 02/28/17 1825 03/01/17 0452  NA 133* 133* 134*  K 2.6* 3.9 3.7  CL 95* 97* 99*  CO2 30 27 29   GLUCOSE 154* 153* 92  BUN 7 8 7   CREATININE 0.71 0.70 0.49  CALCIUM 7.8* 7.9* 7.6*  MG  --  1.7  --    Liver Function Tests:  Recent Labs Lab 02/27/17 1053 02/28/17 1825  AST 41 39  ALT 30 29  ALKPHOS 141* 143*  BILITOT 0.5 0.5  PROT 5.2* 5.1*  ALBUMIN 1.9* 1.8*   CBC:  Recent Labs Lab 02/27/17 1053 02/28/17 1825 03/01/17 0452  WBC 52.6* 56.1* 50.2*  NEUTROABS 50.2*  --   --   HGB 7.8* 8.0* 7.1*  HCT 24.2* 25.8* 22.3*  MCV 83.0 85.9 83.7  PLT 719* 750* 690*     Recent Results (from the past 240 hour(s))  Culture, blood (routine x 2)     Status: None (Preliminary result)   Collection Time: 02/27/17 12:45 PM  Result Value Ref Range Status   Specimen Description BLOOD LEFT HAND  Final   Special Requests   Final    BOTTLES DRAWN AEROBIC AND ANAEROBIC 8 CC AEROBIC, 6CC ANAEROBIC   Culture NO GROWTH 3 DAYS  Final   Report Status PENDING  Incomplete  Culture, blood (routine x 2)     Status: None (Preliminary result)   Collection Time: 02/27/17 12:50 PM  Result Value Ref Range Status   Specimen Description BLOOD RIGHT ARM  Final   Special Requests   Final    BOTTLES DRAWN AEROBIC AND ANAEROBIC 10 CC AEROBIC, 9 CC ANAEROBIC   Culture NO GROWTH 3 DAYS  Final   Report Status PENDING  Incomplete  Urine culture     Status: None   Collection Time: 02/28/17  1:15 PM  Result Value Ref Range Status   Specimen Description URINE, CLEAN CATCH  Final   Special Requests NONE  Final   Culture   Final    NO GROWTH Performed at [facility], [place] [address] [place], [place] [address]    Report Status 03/01/2017 FINAL  Final  C  difficile quick scan w PCR reflex     Status: Abnormal   Collection Time: 03/01/17  1:47 PM  Result Value Ref Range Status   C Diff antigen POSITIVE (A) NEGATIVE Final   C Diff toxin NEGATIVE NEGATIVE Final   C Diff interpretation Results are indeterminate. See PCR results.  Final  Clostridium Difficile by PCR     Status: None   Collection Time: 03/01/17  1:47 PM  Result Value Ref Range Status   Toxigenic C Difficile by pcr NEGATIVE NEGATIVE Final    Comment: Patient is colonized with non toxigenic C. difficile. May not need treatment unless significant symptoms are present.     Studies: Dg Chest Port 1 View  Result Date: 02/28/2017 CLINICAL DATA:  PICC line placement EXAM: PORTABLE CHEST 1 VIEW COMPARISON:  Portable exam 1230 hours compared to 02/27/2017 FINDINGS: LEFT arm PICC line tip projects over RIGHT atrium; recommend withdrawal 3.0 cm to place tip in SVC near the cavoatrial junction. Normal heart size, mediastinal contours and pulmonary vascularity for technique. Bibasilar effusions and atelectasis greater on RIGHT. No pneumothorax. Bones demineralized. IMPRESSION: Recommend withdrawal of LEFT arm PICC line 3.0 cm to place tip in SVC near the cavoatrial junction. Bibasilar atelectasis and pleural effusions greater on RIGHT. Findings called to Idamae Lusher RN on 02/28/2017 at 1243 hrs. Electronically Signed   By: Lavonia Dana M.D.   On: 02/28/2017 12:44    Scheduled Meds: . apixaban  5 mg Oral BID  . diltiazem  240 mg Oral QHS  . ferrous sulfate  325 mg Oral Q breakfast  . metoprolol succinate  50 mg Oral Daily   Continuous Infusions: . sodium chloride    . fluconazole (DIFLUCAN) IV Stopped (03/01/17 1727)  . meropenem (MERREM) 1 GM IVPB Stopped (03/02/17 0849)  . vancomycin Stopped (03/02/17 0546)    Assessment/Plan:  1. Clinical sepsis with Fever, tachycardia and leukocytosis. Patient on meropenem and vancomycin and fluconazole. So far cultures are negative. Monitor on  aggressive antibiotics through the weekend and reevaluate with the help of infectious disease specialist on Monday. This could also be due to her cancer.  2. Tachycardia. On metoprolol and Cardizem 3. Left hydronephrosis. Seen by urology 4. Metastatic carcinoma of unknown primary. Follow-up with oncology as outpatient. Overall prognosis is poor. Please see ACP note from today. 5. DVT on Eliquis 6. Wound VAC for abdomen  Code Status:     Code Status Orders        Start     Ordered   02/28/17 1647  Full code  Continuous  02/28/17 1647    Code Status History    Date Active Date Inactive Code Status Order ID Comments User Context   01/30/2017 12:09 AM 02/04/2017 10:43 PM Full Code [id number]  [place], Ubaldo Glassing, DO Inpatient   01/21/2017  9:00 PM 01/24/2017  5:38 PM Full Code [id number]  Fritzi Mandes, MD Inpatient   12/31/2016  3:58 AM 01/10/2017  4:29 PM Full Code [id number]  Jules Husbands, MD Inpatient   12/30/2016 10:48 PM 12/31/2016  3:58 AM Full Code [id number]  Schermerhorn, Gwen Her, MD ED   12/20/2016 11:16 AM 12/21/2016  1:51 PM Full Code [id number]  Schermerhorn, Gwen Her, MD Inpatient     Family Communication: Husband Yesterday Disposition Plan: To be determined based on clinical course  Consultants:  Urology  Infectious disease  Antibiotics:  Vancomycin  Meropenem  Diflucan  Time spent: 35 minutes, including ACP care  [place]

## 2017-03-02 NOTE — Progress Notes (Signed)
Pharmacy Antibiotic Note  Anna [name] is a 51 y.o. female admitted on 02/28/2017 with bacteremia.  Pharmacy has been consulted for vancomycin dosing.  Plan: Vancomycin 1000 mg IV x 1 followed in approximately 10 hours (stacked dosing) by vancomycin 750 mg IV Q12H, predicted trough 17 mcg/mL. Pharmacy will continue to follow and adjust as needed to maintain trough 15 to 20 mcg/mL.   7/8 1641 vancomycin trough 15 mcg/mL. Continue current dose. Pharmacy will continue to follow and adjust as needed to maintain trough 15 to 20 mcg/mL.   Vd 36.3 L, Ke 0.067 hr-1, T1/2 10.4 hr  Height: 5\' 3"  (160 cm) Weight: 126 lb 6.4 oz (57.3 kg) IBW/kg (Calculated) : 52.4  Temp (24hrs), Avg:100.1 F (37.8 C), Min:98.5 F (36.9 C), Max:102.6 F (39.2 C)   Recent Labs Lab 02/27/17 1053 02/28/17 1825 03/01/17 0452 03/02/17 1641  WBC 52.6* 56.1* 50.2*  --   CREATININE 0.71 0.70 0.49  --   VANCOTROUGH  --   --   --  15    Estimated Creatinine Clearance: 67.3 mL/min (by C-G formula based on SCr of 0.49 mg/dL).    Allergies  Allergen Reactions  . Cefepime Rash  . Penicillins Rash    As a child. Has patient had a PCN reaction causing immediate rash, facial/tongue/throat swelling, SOB or lightheadedness with hypotension: Unknown Has patient had a PCN reaction causing severe rash involving mucus membranes or skin necrosis: Unknown Has patient had a PCN reaction that required hospitalization: No Has patient had a PCN reaction occurring within the last 10 years: No If all of the above answers are "NO", then may proceed with Cephalosporin use.    Thank you for allowing pharmacy to be a part of this patient's care.  Laural Benes, Pharm.D., BCPS Clinical Pharmacist 03/02/2017 5:49 PM

## 2017-03-02 NOTE — Progress Notes (Signed)
Patient ID: Anna [name], female   DOB: [birth year], 51 y.o.   MRN: [id number]  ACP Note.  Discussion with patient at the bedside face-to-face  Patient with a diagnosis of metastatic carcinoma of unknown primary. I explained that any treatment options would be palliative in nature and not curative. I discussed CODE STATUS with the patient. I explained that most people do not survive the CPR process and those who do we cannot tell what type shape they will be in. The patient would like to think about things a little bit further and discuss with her husband and then get back to me on her CODE STATUS decision.  Time spent on ACP discussion 17 minutes  Dr. Loletha Grayer

## 2017-03-02 NOTE — Progress Notes (Signed)
Hematology/Oncology Consult note [facility]  [phone] [phone]  Patient Care Team: McLean-Scocuzza, Nino Glow, MD as PCP - General (Internal Medicine) Clent Jacks, RN as Registered Nurse   Name of the patient: Anna [name]  [id number]  December 14, 1965   Date of visit 03/02/2017   Interval history- feels fatigued. No diarrhea. Continues to have intermittent fever   Review of systems- Review of Systems  Constitutional: Positive for malaise/fatigue. Negative for chills, fever and weight loss.  HENT: Negative for congestion, ear discharge and nosebleeds.   Eyes: Negative for blurred vision.  Respiratory: Negative for cough, hemoptysis, sputum production, shortness of breath and wheezing.   Cardiovascular: Negative for chest pain, palpitations, orthopnea and claudication.  Gastrointestinal: Positive for abdominal pain. Negative for blood in stool, constipation, diarrhea, heartburn, melena, nausea and vomiting.  Genitourinary: Negative for dysuria, flank pain, frequency, hematuria and urgency.  Musculoskeletal: Negative for back pain, joint pain and myalgias.  Skin: Negative for rash.  Neurological: Negative for dizziness, tingling, focal weakness, seizures, weakness and headaches.  Endo/Heme/Allergies: Does not bruise/bleed easily.  Psychiatric/Behavioral: Negative for depression and suicidal ideas. The patient does not have insomnia.       Allergies  Allergen Reactions  . Cefepime Rash  . Penicillins Rash    As a child. Has patient had a PCN reaction causing immediate rash, facial/tongue/throat swelling, SOB or lightheadedness with hypotension: Unknown Has patient had a PCN reaction causing severe rash involving mucus membranes or skin necrosis: Unknown Has patient had a PCN reaction that required hospitalization: No Has patient had a PCN reaction occurring within the last 10 years: No If all of the above answers are "NO", then  may proceed with Cephalosporin use.      Past Medical History:  Diagnosis Date  . Cancer ([place])   . Cancer with unknown primary site [facility])   . Colon polyp   . Diverticulosis   . Fibroid uterus   . Heme positive stool 08/01/2014  . HH (hiatus hernia)   . Large bowel perforation ([place]) 12/31/2016  . Metastatic adenocarcinoma ([place]) 12/20/2016  . Pelvic pain in female 01/13/2017  . Perforation of sigmoid colon [facility])      Past Surgical History:  Procedure Laterality Date  . COLONOSCOPY    . CYSTOSCOPY  12/20/2016   Procedure: CYSTOSCOPY;  Surgeon: Boykin Nearing, MD;  Location: [facility] ORS;  Service: Gynecology;;  . ESOPHAGOGASTRODUODENOSCOPY    . LAPAROSCOPIC BILATERAL SALPINGECTOMY Bilateral 12/20/2016   Procedure: LAPAROSCOPIC BILATERAL SALPINGOOPHERECTOMY,  Excision of anterior 5 cm calcified uterine fibroid;  Surgeon: Boykin Nearing, MD;  Location: [facility] ORS;  Service: Gynecology;  Laterality: Bilateral;  . LAPAROSCOPIC LYSIS OF ADHESIONS  12/20/2016   Procedure: LAPAROSCOPIC LYSIS OF ADHESIONS WITH DISSECTION OF LEFT TUBE AND OVARY.;  Surgeon: Olean Ree, MD;  Location: [facility] ORS;  Service: General;;  . LAPAROTOMY N/A 12/31/2016   Procedure: EXPLORATORY LAPAROTOMY, sigmoid colectomy, removal of omentum, colostomy;  Surgeon: Jules Husbands, MD;  Location: [facility] ORS;  Service: General;  Laterality: N/A;  . SIGMOIDOSCOPY  12/20/2016   Procedure: RIGID SIGMOIDOSCOPY;  Surgeon: Olean Ree, MD;  Location: [facility] ORS;  Service: General;;  . TONSILLECTOMY      Social History   Social History  . Marital status: Married    Spouse name: N/A  . Number of children: N/A  . Years of education: N/A   Occupational History  . Not on file.   Social History Main Topics  .  Smoking status: Never Smoker  . Smokeless tobacco: Never Used  . Alcohol use No  . Drug use: No  . Sexual activity: Yes   Other Topics Concern  . Not on file   Social History Narrative  . No narrative on file      Family History  Problem Relation Age of Onset  . Colon cancer Mother 64  . Diabetes Mother   . Asthma Father   . Breast cancer Neg Hx      Current Facility-Administered Medications:  .  0.9 %  sodium chloride infusion, 250 mL, Intravenous, PRN, Dustin Flock, MD .  acetaminophen (TYLENOL) tablet 650 mg, 650 mg, Oral, Q6H PRN, 650 mg at 03/01/17 2114 **OR** acetaminophen (TYLENOL) suppository 650 mg, 650 mg, Rectal, Q6H PRN, Dustin Flock, MD .  apixaban (ELIQUIS) tablet 5 mg, 5 mg, Oral, BID, Dustin Flock, MD, 5 mg at 03/02/17 2121 .  diltiazem (CARDIZEM CD) 24 hr capsule 240 mg, 240 mg, Oral, QHS, Dustin Flock, MD, 240 mg at 03/02/17 2121 .  diphenhydrAMINE (BENADRYL) capsule 25 mg, 25 mg, Oral, Q6H PRN, Dustin Flock, MD .  feeding supplement (ENSURE ENLIVE) (ENSURE ENLIVE) liquid 237 mL, 237 mL, Oral, BID BM, Leslye Peer, Richard, MD, 237 mL at 03/02/17 1653 .  ferrous sulfate tablet 325 mg, 325 mg, Oral, Q breakfast, Dustin Flock, MD, 325 mg at 03/02/17 0804 .  fluconazole (DIFLUCAN) IVPB 200 mg, 200 mg, Intravenous, Q24H, Dustin Flock, MD, Stopped at 03/02/17 1832 .  meropenem (MERREM) 1 g in sodium chloride 0.9 % 100 mL IVPB, 1 g, Intravenous, Q8H, Loletha Grayer, MD, Stopped at 03/02/17 1722 .  metoprolol succinate (TOPROL-XL) 24 hr tablet 50 mg, 50 mg, Oral, Daily, Leslye Peer, Richard, MD, 50 mg at 03/02/17 0804 .  ondansetron (ZOFRAN) tablet 4 mg, 4 mg, Oral, Q6H PRN **OR** ondansetron (ZOFRAN) injection 4 mg, 4 mg, Intravenous, Q6H PRN, Dustin Flock, MD .  oxyCODONE-acetaminophen (PERCOCET/ROXICET) 5-325 MG per tablet 1-2 tablet, 1-2 tablet, Oral, Q6H PRN, Dustin Flock, MD .  vancomycin (VANCOCIN) IVPB 750 mg/150 ml premix, 750 mg, Intravenous, Q12H, Dustin Flock, MD, Stopped at 03/02/17 1937  Physical exam:  Vitals:   03/01/17 2238 03/02/17 0429 03/02/17 1334 03/02/17 2008  BP: (!) 97/55 (!) 101/59 101/63 113/76  Pulse: (!) 115 (!) 106 98 (!) 121   Resp:  19  18  Temp:  99.6 F (37.6 C) 98.5 F (36.9 C) 100.2 F (37.9 C)  TempSrc:  Oral Oral Oral  SpO2:  94% 91% 95%  Weight:      Height:       Physical Exam  Constitutional: She is oriented to person, place, and time and well-developed, well-nourished, and in no distress.  HENT:  Head: Normocephalic and atraumatic.  Eyes: EOM are normal. Pupils are equal, round, and reactive to light.  Neck: Normal range of motion.  Cardiovascular: Regular rhythm and normal heart sounds.   tachycardic  Pulmonary/Chest: Effort normal and breath sounds normal.  Abdominal: Soft. Bowel sounds are normal.  Neurological: She is alert and oriented to person, place, and time.  Skin: Skin is warm and dry.     CMP Latest Ref Rng & Units 03/01/2017  Glucose 65 - 99 mg/dL 92  BUN 6 - 20 mg/dL 7  Creatinine 0.44 - 1.00 mg/dL 0.49  Sodium 135 - 145 mmol/L 134(L)  Potassium 3.5 - 5.1 mmol/L 3.7  Chloride 101 - 111 mmol/L 99(L)  CO2 22 - 32 mmol/L 29  Calcium 8.9 - 10.3  mg/dL 7.6(L)  Total Protein 6.5 - 8.1 g/dL -  Total Bilirubin 0.3 - 1.2 mg/dL -  Alkaline Phos 38 - 126 U/L -  AST 15 - 41 U/L -  ALT 14 - 54 U/L -   CBC Latest Ref Rng & Units 03/01/2017  WBC 3.6 - 11.0 K/uL 50.2(HH)  Hemoglobin 12.0 - 16.0 g/dL 7.1(L)  Hematocrit 35.0 - 47.0 % 22.3(L)  Platelets 150 - 440 K/uL 690(H)    @IMAGES @  Ct Chest W Contrast  Result Date: 02/27/2017 CLINICAL DATA:  Fever and cough for several weeks. History of carcinoma of unknown primary. Right arm swelling starting last night. EXAM: CT CHEST, ABDOMEN, AND PELVIS WITH CONTRAST TECHNIQUE: Multidetector CT imaging of the chest, abdomen and pelvis was performed following the standard protocol during bolus administration of intravenous contrast. CONTRAST:  159mL ISOVUE-300 IOPAMIDOL (ISOVUE-300) INJECTION 61% COMPARISON:  01/29/2017 FINDINGS: CT CHEST FINDINGS Cardiovascular: Heart size upper normal. No pericardial effusion. Right PICC line tip is positioned  at the SVC/RA junction. Mediastinum/Nodes: Scattered small to borderline enlarged mediastinal and hilar lymph nodes are noted bilaterally. There is no hilar lymphadenopathy. The esophagus has normal imaging features. Lungs/Pleura: Small bilateral pleural effusions are new in the interval. 12 mm posterior right upper lobe pleural-based nodule has progressed since prior study. Innumerable ill-defined and irregular pulmonary nodules are again identified. 10 mm left upper lobe nodule image 31 series 4 was 10 mm on the prior study. 13 mm right middle lobe nodule on today's study was 16 mm on the prior study. An 8 mm nodule seen in the lingula (image 84 series 4) was 8 mm previously. There is bilateral dependent collapse/consolidation. Musculoskeletal: Bone windows reveal no worrisome lytic or sclerotic osseous lesions. CT ABDOMEN PELVIS FINDINGS Hepatobiliary: Multiple hepatic lesions are again identified. 3.5 cm lesion in the medial segment left liver was 2.8 cm on the prior study and is more necrotic on today's exam. 2.6 cm lesion in the lateral segment of the left liver was 2.6 cm previously. 3.5 cm lesion inferior aspect of the medial segment left liver, adjacent to the gallbladder measured only 1.6 cm on the prior study. Multiple new liver lesions are seen in the dome of the right liver (compare image 40 series 2 today to image 15 series 11 previously. Gallbladder wall thickening or pericholecystic fluid noted. No intrahepatic or extrahepatic biliary dilation. Pancreas: No focal mass lesion. No dilatation of the main duct. No intraparenchymal cyst. No peripancreatic edema. Spleen: No splenomegaly. No focal mass lesion. Adrenals/Urinary Tract: No adrenal nodule or mass. Right kidney unremarkable. Asymmetrically decreased perfusion noted to the left kidney and there is mild left hydroureteronephrosis. Left ureter remains dilated into the pelvis where abnormal soft tissue is identified along the left pelvic sidewall.  Stomach/Bowel: Stomach is nondistended. No gastric wall thickening. No evidence of outlet obstruction. Duodenum is normally positioned as is the ligament of Treitz. No small bowel wall thickening. No small bowel dilatation. The terminal ileum is normal. The appendix is normal. No gross colonic mass. No colonic wall thickening. No substantial diverticular change. Left lower quadrant sigmoid end colostomy noted. Vascular/Lymphatic: No abdominal aortic aneurysm. Mild hepatoduodenal ligament lymphadenopathy appears progressed in the interval. Necrotic left periaortic lymph node just caudal to the left renal vein is 15 mm short axis today which compares to 14 mm previously. A second 13 mm left para-aortic lymph node seen on image 66 was 8 mm previously. Reproductive: Irregular and heterogeneous soft tissue again incorporates the uterus as seen  on multiple prior studies. Neoplastic involvement is a distinct consideration in this process extends to the sidewall of the pelvis bilaterally in accounts for the evolving left hydroureteronephrosis. Other: Small volume intraperitoneal free fluid. Musculoskeletal: Bone windows reveal no worrisome lytic or sclerotic osseous lesions. IMPRESSION: 1. Aside from the presence of the right-sided PICC line, no IV 6 bone a shin for the patient's reported history of acute onset right upper extremity swelling. There may be some thrombus along the PICC line in the subclavian vein, but this is not well assessed because of the streak artifact from the PICC line itself. 2. Clear interval progression of some of the hepatic metastases. 3. The innumerable irregular pulmonary show no clear trend towards progression or regression. There is a new pleural-based nodule in the right apex and other nodules measure minimally larger and smaller since prior study. 4. There is new bibasilar collapse/consolidation with new small bilateral pleural effusions. 5. New mild lymphadenopathy in the hepatoduodenal  ligament with stable to progression of para-aortic upper abdominal lymphadenopathy. 6. Similar irregular soft tissue in the pelvis involving the uterus with extension to the sidewalls bilaterally. Incorporation of the distal left ureter on the left results and mild left hydroureteronephrosis and decreased perfusion to the left kidney on today's study is consistent with a component of associated obstructive uropathy. Electronically Signed   By: Misty Stanley M.D.   On: 02/27/2017 18:06   Ct Abdomen Pelvis W Contrast  Result Date: 02/27/2017 CLINICAL DATA:  Fever and cough for several weeks. History of carcinoma of unknown primary. Right arm swelling starting last night. EXAM: CT CHEST, ABDOMEN, AND PELVIS WITH CONTRAST TECHNIQUE: Multidetector CT imaging of the chest, abdomen and pelvis was performed following the standard protocol during bolus administration of intravenous contrast. CONTRAST:  11mL ISOVUE-300 IOPAMIDOL (ISOVUE-300) INJECTION 61% COMPARISON:  01/29/2017 FINDINGS: CT CHEST FINDINGS Cardiovascular: Heart size upper normal. No pericardial effusion. Right PICC line tip is positioned at the SVC/RA junction. Mediastinum/Nodes: Scattered small to borderline enlarged mediastinal and hilar lymph nodes are noted bilaterally. There is no hilar lymphadenopathy. The esophagus has normal imaging features. Lungs/Pleura: Small bilateral pleural effusions are new in the interval. 12 mm posterior right upper lobe pleural-based nodule has progressed since prior study. Innumerable ill-defined and irregular pulmonary nodules are again identified. 10 mm left upper lobe nodule image 31 series 4 was 10 mm on the prior study. 13 mm right middle lobe nodule on today's study was 16 mm on the prior study. An 8 mm nodule seen in the lingula (image 84 series 4) was 8 mm previously. There is bilateral dependent collapse/consolidation. Musculoskeletal: Bone windows reveal no worrisome lytic or sclerotic osseous lesions. CT  ABDOMEN PELVIS FINDINGS Hepatobiliary: Multiple hepatic lesions are again identified. 3.5 cm lesion in the medial segment left liver was 2.8 cm on the prior study and is more necrotic on today's exam. 2.6 cm lesion in the lateral segment of the left liver was 2.6 cm previously. 3.5 cm lesion inferior aspect of the medial segment left liver, adjacent to the gallbladder measured only 1.6 cm on the prior study. Multiple new liver lesions are seen in the dome of the right liver (compare image 40 series 2 today to image 15 series 11 previously. Gallbladder wall thickening or pericholecystic fluid noted. No intrahepatic or extrahepatic biliary dilation. Pancreas: No focal mass lesion. No dilatation of the main duct. No intraparenchymal cyst. No peripancreatic edema. Spleen: No splenomegaly. No focal mass lesion. Adrenals/Urinary Tract: No adrenal nodule or mass.  Right kidney unremarkable. Asymmetrically decreased perfusion noted to the left kidney and there is mild left hydroureteronephrosis. Left ureter remains dilated into the pelvis where abnormal soft tissue is identified along the left pelvic sidewall. Stomach/Bowel: Stomach is nondistended. No gastric wall thickening. No evidence of outlet obstruction. Duodenum is normally positioned as is the ligament of Treitz. No small bowel wall thickening. No small bowel dilatation. The terminal ileum is normal. The appendix is normal. No gross colonic mass. No colonic wall thickening. No substantial diverticular change. Left lower quadrant sigmoid end colostomy noted. Vascular/Lymphatic: No abdominal aortic aneurysm. Mild hepatoduodenal ligament lymphadenopathy appears progressed in the interval. Necrotic left periaortic lymph node just caudal to the left renal vein is 15 mm short axis today which compares to 14 mm previously. A second 13 mm left para-aortic lymph node seen on image 66 was 8 mm previously. Reproductive: Irregular and heterogeneous soft tissue again  incorporates the uterus as seen on multiple prior studies. Neoplastic involvement is a distinct consideration in this process extends to the sidewall of the pelvis bilaterally in accounts for the evolving left hydroureteronephrosis. Other: Small volume intraperitoneal free fluid. Musculoskeletal: Bone windows reveal no worrisome lytic or sclerotic osseous lesions. IMPRESSION: 1. Aside from the presence of the right-sided PICC line, no IV 6 bone a shin for the patient's reported history of acute onset right upper extremity swelling. There may be some thrombus along the PICC line in the subclavian vein, but this is not well assessed because of the streak artifact from the PICC line itself. 2. Clear interval progression of some of the hepatic metastases. 3. The innumerable irregular pulmonary show no clear trend towards progression or regression. There is a new pleural-based nodule in the right apex and other nodules measure minimally larger and smaller since prior study. 4. There is new bibasilar collapse/consolidation with new small bilateral pleural effusions. 5. New mild lymphadenopathy in the hepatoduodenal ligament with stable to progression of para-aortic upper abdominal lymphadenopathy. 6. Similar irregular soft tissue in the pelvis involving the uterus with extension to the sidewalls bilaterally. Incorporation of the distal left ureter on the left results and mild left hydroureteronephrosis and decreased perfusion to the left kidney on today's study is consistent with a component of associated obstructive uropathy. Electronically Signed   By: Misty Stanley M.D.   On: 02/27/2017 18:06   US Venous Img Upper Uni Right  Result Date: 02/27/2017 CLINICAL DATA:  Right upper extremity edema. EXAM: RIGHT UPPER EXTREMITY VENOUS DOPPLER ULTRASOUND TECHNIQUE: Gray-scale sonography with graded compression, as well as color Doppler and duplex ultrasound were performed to evaluate the upper extremity deep venous system  from the level of the subclavian vein and including the jugular, axillary, basilic, radial, ulnar and upper cephalic vein. Spectral Doppler was utilized to evaluate flow at rest and with distal augmentation maneuvers. COMPARISON:  None. FINDINGS: Contralateral Subclavian Vein: Respiratory phasicity is normal and symmetric with the symptomatic side. No evidence of thrombus. Normal compressibility. Internal Jugular Vein: No evidence of thrombus. Normal compressibility, respiratory phasicity and response to augmentation. Subclavian Vein: There is a nonocclusive thrombus within the distal subclavian vein, extending into the axillary vein and brachial vein branch. Cephalic Vein: No evidence of thrombus. Normal compressibility, respiratory phasicity and response to augmentation. Basilic Vein: Partially visualized due to bandage obscuring it. No evidence of thrombus where visualized. Normal compressibility, respiratory phasicity and response to augmentation. Radial Veins: No evidence of thrombus. Normal compressibility, respiratory phasicity and response to augmentation. Ulnar Veins: No evidence  of thrombus. Normal compressibility, respiratory phasicity and response to augmentation. Venous Reflux:  None visualized. Other Findings: PICC line originating within the brachials vein branch associated with partially occlusive thrombus. 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 These results will be called to the ordering clinician or representative by the Radiologist Assistant, and communication documented in the PACS or zVision Dashboard. Electronically Signed   By: Fidela Salisbury M.D.   On: 02/27/2017 18:11   Dg Chest Port 1 View  Result Date: 02/28/2017 CLINICAL DATA:  PICC line placement EXAM: PORTABLE CHEST 1 VIEW COMPARISON:  Portable exam 1230 hours compared to 02/27/2017 FINDINGS: LEFT arm  PICC line tip projects over RIGHT atrium; recommend withdrawal 3.0 cm to place tip in SVC near the cavoatrial junction. Normal heart size, mediastinal contours and pulmonary vascularity for technique. Bibasilar effusions and atelectasis greater on RIGHT. No pneumothorax. Bones demineralized. IMPRESSION: Recommend withdrawal of LEFT arm PICC line 3.0 cm to place tip in SVC near the cavoatrial junction. Bibasilar atelectasis and pleural effusions greater on RIGHT. Findings called to Idamae Lusher RN on 02/28/2017 at 1243 hrs. Electronically Signed   By: Lavonia Dana M.D.   On: 02/28/2017 12:44     Assessment and plan- Patient is a 51 y.o. 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 infectious work up negative so far. She has new hydronephrosis. Urology has seen the patient and they do not think hydronephrosis cause of fever. UA and urine culture as well as blood culture negative so far. This could also be tumor associated fever. Appreciate ID input. Patient has received 2 doses of keytruda and will likely be discharged next week and see Dr. Rogue Bussing as an outpatient. Her prognosis is poor in the setting of rapidly progressing malignancy and recent infections  Continue eliquis for PE. Kidney functions WNL  PICC associated DVT. Right picc is out and left sided line in place  Will continue to follow    Dr. Randa Evens, MD, MPH [facility] at [facility] Pager- [phone] 03/02/2017 9:58 PM

## 2017-03-03 ENCOUNTER — Encounter: Payer: Self-pay | Admitting: Internal Medicine

## 2017-03-03 DIAGNOSIS — R112 Nausea with vomiting, unspecified: Secondary | ICD-10-CM

## 2017-03-03 DIAGNOSIS — Z66 Do not resuscitate: Secondary | ICD-10-CM

## 2017-03-03 DIAGNOSIS — M7989 Other specified soft tissue disorders: Secondary | ICD-10-CM

## 2017-03-03 DIAGNOSIS — Z9221 Personal history of antineoplastic chemotherapy: Secondary | ICD-10-CM

## 2017-03-03 DIAGNOSIS — I82401 Acute embolism and thrombosis of unspecified deep veins of right lower extremity: Secondary | ICD-10-CM

## 2017-03-03 DIAGNOSIS — D649 Anemia, unspecified: Secondary | ICD-10-CM

## 2017-03-03 DIAGNOSIS — D72829 Elevated white blood cell count, unspecified: Secondary | ICD-10-CM

## 2017-03-03 LAB — CBC
HEMATOCRIT: 22.3 % — AB (ref 35.0–47.0)
HEMOGLOBIN: 7 g/dL — AB (ref 12.0–16.0)
MCH: 26.6 pg (ref 26.0–34.0)
MCHC: 31.1 g/dL — AB (ref 32.0–36.0)
MCV: 85.3 fL (ref 80.0–100.0)
Platelets: 636 10*3/uL — ABNORMAL HIGH (ref 150–440)
RBC: 2.62 MIL/uL — ABNORMAL LOW (ref 3.80–5.20)
RDW: 18.2 % — ABNORMAL HIGH (ref 11.5–14.5)
WBC: 46.3 10*3/uL — AB (ref 3.6–11.0)

## 2017-03-03 LAB — BASIC METABOLIC PANEL
ANION GAP: 5 (ref 5–15)
BUN: 8 mg/dL (ref 6–20)
CHLORIDE: 99 mmol/L — AB (ref 101–111)
CO2: 31 mmol/L (ref 22–32)
Calcium: 7.7 mg/dL — ABNORMAL LOW (ref 8.9–10.3)
Creatinine, Ser: 0.56 mg/dL (ref 0.44–1.00)
GFR calc non Af Amer: >60 mL/min (ref >60)
Glucose, Bld: 100 mg/dL — ABNORMAL HIGH (ref 65–99)
Potassium: 3.5 mmol/L (ref 3.5–5.1)
Sodium: 135 mmol/L (ref 135–145)

## 2017-03-03 LAB — PREPARE RBC (CROSSMATCH)

## 2017-03-03 MED ORDER — SODIUM CHLORIDE 0.9 % IV SOLN
Freq: Once | INTRAVENOUS | Status: AC
  Administered 2017-03-03: 16:00:00 via INTRAVENOUS

## 2017-03-03 NOTE — Progress Notes (Signed)
Anna [name]   DOB:08/23/1966   YQ#:[id number]    Subjective: Patient continues to have intermittent fevers. No diarrhea. No pain. Intermittent nausea and vomiting. Mild swelling in the right upper extremity; although improving.   Review of systems: Overall feels poorly. However appetite slightly improved today. Complete 10 point review of system is done which is negative for mentioned above.   Objective:  Vitals:   03/03/17 1540 03/03/17 1615  BP: (!) 84/46 (!) 88/50  Pulse: 94 95  Resp:  18  Temp: 98.5 F (36.9 C) 98.6 F (37 C)     Intake/Output Summary (Last 24 hours) at 03/03/17 1705 Last data filed at 03/03/17 1600  Gross per 24 hour  Intake           910.26 ml  Output              225 ml  Net           685.26 ml    GENERAL Alert, no distress and comfortable. Temporal wasting noted. Accompanied by her husband. EYES: no pallor or icterus OROPHARYNX: no thrush or ulceration. NECK: supple, no masses felt LYMPH:  no palpable lymphadenopathy in the cervical, axillary or inguinal regions LUNGS: decreased breath sounds to auscultation at bases and  No wheeze or crackles HEART/CVS: regular rate & rhythm and no murmurs; No lower extremity edema ABDOMEN: abdomen soft, tender  on deep palpation. and normal bowel sounds; abdominal wound wound VAC/colostomy noted. Musculoskeletal:no cyanosis of digits and no clubbing  PSYCH: alert & oriented x 3 with fluent speech NEURO: no focal motor/sensory deficits SKIN:  no rashes or significant lesions   Labs:  Lab Results  Component Value Date   WBC 46.3 (H) 03/03/2017   HGB 7.0 (L) 03/03/2017   HCT 22.3 (L) 03/03/2017   MCV 85.3 03/03/2017   PLT 636 (H) 03/03/2017   NEUTROABS 50.2 (H) 02/27/2017    Lab Results  Component Value Date   NA 135 03/03/2017   K 3.5 03/03/2017   CL 99 (L) 03/03/2017   CO2 31 03/03/2017    Studies:  No results found.  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 Status post Keytruda cycle #2 day #5. CT scan-most recent July5th- progression of disease. Discussion the patient and husband regarding the poor prognosis- given the rapidly growing malignancy. I would recommend continued current therapy if patient continues to tolerate. Also discussed regarding addition of PARP inhibitors- BRCA positive. However they're very aware that the malignancy continues to grow/progress-these treatments might not have time to work.   # Fevers- infectious versus tumor fevers. Given the absence of any obvious is of infection; status post multiple antibiotics. Suspect tumor fevers. Recommend scheduled NSAIDs to control fevers. Discussed with Dr.Fitzgerald.   # DNR/DNI- discussed the patient and husband. I would not recommend aggressive measures in face of serious terminal illness. Discussed regarding palliative care evaluation. They are in agreement.  # Anemia- multifactorial; agree with PRBC transfusion.  # DVT-right-sided line related; currently PICC line on the left side/PE- continue Eliquis.  # 40 minutes face-to-face with the patient discussing the above plan of care; more than 50% of time spent on prognosis/ natural history; counseling and coordination.   Cammie Sickle, MD 03/03/2017  5:05 PM

## 2017-03-03 NOTE — Progress Notes (Signed)
Patient ID: Anna [name], female   DOB: 05-05-1966, 51 y.o.   MRN: [id number]  Anna [name] PROGRESS NOTE  Anna [name] [facility] XLK:[id number] DOB: 1965/12/22 DOA: 02/28/2017 PCP: Anna [name], Anna Glow, MD  HPI/Subjective: Patient feels okay. Does have some abdominal discomfort. Sometimes she does have some thicker stool coming out of the colostomy. Occasional cough.  Objective: Vitals:   03/03/17 0457 03/03/17 1205  BP: (!) 99/59 103/64  Pulse: (!) 115 (!) 101  Resp: 20 20  Temp: 98.4 F (36.9 C) (!) 102.2 F (39 C)    Filed Weights   02/28/17 1659  Weight: 57.3 kg (126 lb 6.4 oz)    ROS: Review of Systems  Constitutional: Negative for chills and fever.  Eyes: Negative for blurred vision.  Respiratory: Positive for cough. Negative for shortness of breath.   Cardiovascular: Negative for chest pain.  Gastrointestinal: Positive for abdominal pain. Negative for constipation, diarrhea, nausea and vomiting.  Genitourinary: Negative for dysuria.  Musculoskeletal: Negative for joint pain.  Neurological: Negative for dizziness and headaches.   Exam: Physical Exam  HENT:  Nose: No mucosal edema.  Mouth/Throat: No oropharyngeal exudate or posterior oropharyngeal edema.  Eyes: Conjunctivae, EOM and lids are normal. Pupils are equal, round, and reactive to light.  Neck: No JVD present. Carotid bruit is not present. No edema present. No thyroid mass and no thyromegaly present.  Cardiovascular: S1 normal and S2 normal.  Tachycardia present.  Exam reveals no gallop.   No murmur heard. Pulses:      Dorsalis pedis pulses are 2+ on the right side, and 2+ on the left side.  Respiratory: No respiratory distress. She has no wheezes. She has no rhonchi. She has no rales.  GI: Soft. Bowel sounds are normal. There is no tenderness.  Musculoskeletal:       Right ankle: She exhibits no swelling.       Left ankle: She exhibits no swelling.  Lymphadenopathy:    She has no cervical  adenopathy.  Neurological: She is alert. No cranial nerve deficit.  Skin: Skin is warm. No rash noted. Nails show no clubbing.  Psychiatric: She has a normal mood and affect.      Data Reviewed: Basic Metabolic Panel:  Recent Labs Lab 02/27/17 1053 02/28/17 1825 03/01/17 0452 03/03/17 0556  NA 133* 133* 134* 135  K 2.6* 3.9 3.7 3.5  CL 95* 97* 99* 99*  CO2 30 27 29 31   GLUCOSE 154* 153* 92 100*  BUN 7 8 7 8   CREATININE 0.71 0.70 0.49 0.56  CALCIUM 7.8* 7.9* 7.6* 7.7*  MG  --  1.7  --   --    Liver Function Tests:  Recent Labs Lab 02/27/17 1053 02/28/17 1825  AST 41 39  ALT 30 29  ALKPHOS 141* 143*  BILITOT 0.5 0.5  PROT 5.2* 5.1*  ALBUMIN 1.9* 1.8*   CBC:  Recent Labs Lab 02/27/17 1053 02/28/17 1825 03/01/17 0452 03/03/17 0556  WBC 52.6* 56.1* 50.2* 46.3*  NEUTROABS 50.2*  --   --   --   HGB 7.8* 8.0* 7.1* 7.0*  HCT 24.2* 25.8* 22.3* 22.3*  MCV 83.0 85.9 83.7 85.3  PLT 719* 750* 690* 636*     Recent Results (from the past 240 hour(s))  Culture, blood (routine x 2)     Status: None (Preliminary result)   Collection Time: 02/27/17 12:45 PM  Result Value Ref Range Status   Specimen Description BLOOD LEFT HAND  Final   Special Requests  Final    BOTTLES DRAWN AEROBIC AND ANAEROBIC 8 CC AEROBIC, 6CC ANAEROBIC   Culture NO GROWTH 4 DAYS  Final   Report Status PENDING  Incomplete  Culture, blood (routine x 2)     Status: None (Preliminary result)   Collection Time: 02/27/17 12:50 PM  Result Value Ref Range Status   Specimen Description BLOOD RIGHT ARM  Final   Special Requests   Final    BOTTLES DRAWN AEROBIC AND ANAEROBIC 10 CC AEROBIC, 9 CC ANAEROBIC   Culture NO GROWTH 4 DAYS  Final   Report Status PENDING  Incomplete  Urine culture     Status: None   Collection Time: 02/28/17  1:15 PM  Result Value Ref Range Status   Specimen Description URINE, CLEAN CATCH  Final   Special Requests NONE  Final   Culture   Final    NO GROWTH Performed at  [facility], [place] [address], [place], [place] [address]    Report Status 03/01/2017 FINAL  Final  C difficile quick scan w PCR reflex     Status: Abnormal   Collection Time: 03/01/17  1:47 PM  Result Value Ref Range Status   C Diff antigen POSITIVE (A) NEGATIVE Final   C Diff toxin NEGATIVE NEGATIVE Final   C Diff interpretation Results are indeterminate. See PCR results.  Final  Clostridium Difficile by PCR     Status: None   Collection Time: 03/01/17  1:47 PM  Result Value Ref Range Status   Toxigenic C Difficile by pcr NEGATIVE NEGATIVE Final    Comment: Patient is colonized with non toxigenic C. difficile. May not need treatment unless significant symptoms are present.     Studies: No results found.  Scheduled Meds: . apixaban  5 mg Oral BID  . diltiazem  240 mg Oral QHS  . feeding supplement (ENSURE ENLIVE)  237 mL Oral BID BM  . ferrous sulfate  325 mg Oral Q breakfast  . metoprolol succinate  50 mg Oral Daily   Continuous Infusions: . sodium chloride    . sodium chloride    . fluconazole (DIFLUCAN) IV Stopped (03/02/17 1832)  . meropenem (MERREM) 1 GM IVPB Stopped (03/03/17 1123)  . vancomycin Stopped (03/03/17 0710)    Assessment/Plan:  1. Clinical sepsis with Fever, tachycardia and leukocytosis. Patient on meropenem and vancomycin and fluconazole. So far cultures are negative. Case discussed with infectious disease who will come by today and make recommendations This could also be due to her cancer.  2. Anemia of chronic disease. Hemoglobin drifted down to 7.0. Benefits and risks of blood transfusion explained to the patient. 1 unit of packed red blood cells ordered. 3. Tachycardia. On metoprolol and Cardizem 4. Left hydronephrosis. Seen by urology 5. Metastatic carcinoma of unknown primary. Follow-up with oncology as outpatient. Overall prognosis is poor. Again discussed CODE STATUS. 6. DVT on Eliquis 7. Wound VAC for abdomen  Code Status:     Code  Status Orders        Start     Ordered   02/28/17 1647  Full code  Continuous     02/28/17 1647    Code Status History    Date Active Date Inactive Code Status Order ID Comments User Context   01/30/2017 12:09 AM 02/04/2017 10:43 PM Full Code [id number]  [place], Ubaldo Glassing, DO Inpatient   01/21/2017  9:00 PM 01/24/2017  5:38 PM Full Code [id number]  Fritzi Mandes, MD Inpatient   12/31/2016  3:58 AM  01/10/2017  4:29 PM Full Code [id number]  Jules Husbands, MD Inpatient   12/30/2016 10:48 PM 12/31/2016  3:58 AM Full Code [id number]  Schermerhorn, Gwen Her, MD ED   12/20/2016 11:16 AM 12/21/2016  1:51 PM Full Code [id number]  Schermerhorn, Gwen Her, MD Inpatient     Disposition Plan: Potential home tomorrow  Consultants:  Urology  Infectious disease  Oncology  Antibiotics:  Vancomycin  Meropenem  Diflucan  Time spent: 25 minutes, case discussed with infectious disease specialist  [place], [place] [name]

## 2017-03-03 NOTE — Discharge Instructions (Addendum)
Information on my medicine - ELIQUIS® (apixaban) ° °This medication education was reviewed with me or my healthcare representative as part of my discharge preparation.  The pharmacist that spoke with me during my hospital stay was:  Melissa D Maccia, RPH ° °Why was Eliquis® prescribed for you? °Eliquis® was prescribed for you to reduce the risk of forming blood clots. ° °What do You need to know about Eliquis® ? °Take your Eliquis® TWICE DAILY - one tablet in the morning and one tablet in the evening with or without food.  It would be best to take the doses about the same time each day. ° °If you have difficulty swallowing the tablet whole please discuss with your pharmacist how to take the medication safely. ° °Take Eliquis® exactly as prescribed by your doctor and DO NOT stop taking Eliquis® without talking to the doctor who prescribed the medication.  Stopping may increase your risk of developing a new clot or stroke.  Refill your prescription before you run out. ° °After discharge, you should have regular check-up appointments with your healthcare provider that is prescribing your Eliquis®.  In the future your dose may need to be changed if your kidney function or weight changes by a significant amount or as you get older. ° °What do you do if you miss a dose? °If you miss a dose, take it as soon as you remember on the same day and resume taking twice daily.  Do not take more than one dose of ELIQUIS at the same time. ° °Important Safety Information °A possible side effect of Eliquis® is bleeding. You should call your healthcare provider right away if you experience any of the following: °? Bleeding from an injury or your nose that does not stop. °? Unusual colored urine (red or dark brown) or unusual colored stools (red or black). °? Unusual bruising for unknown reasons. °? A serious fall or if you hit your head (even if there is no bleeding). ° °Some medicines may interact with Eliquis® and might increase your  risk of bleeding or clotting while on Eliquis®. To help avoid this, consult your healthcare provider or pharmacist prior to using any new prescription or non-prescription medications, including herbals, vitamins, non-steroidal anti-inflammatory drugs (NSAIDs) and supplements. ° °This website has more information on Eliquis® (apixaban): www.Eliquis.com. ° ° ° °

## 2017-03-03 NOTE — Progress Notes (Signed)
[facility] responded to a call from the [facility] for an AD in room 111. Pt is awake and alert. Husband is bedside. [place] educated Pt on the AD. Pt and Husband desired time to look over the document. CH is available to help in completion as they are ready.    03/03/17 1100  Clinical Encounter Type  Visited With Patient;Patient and family together  Visit Type Initial;Spiritual support  Referral From Chaplain  Consult/Referral To Chaplain  Spiritual Encounters  Spiritual Needs Literature

## 2017-03-03 NOTE — Care Management (Signed)
Admitted to [facility] with the diagnosis of fever. Lives with husband, [name] [phone]). Discharged from this facility 02/04/17. Primary care physician is Dr. Terese Door. PICC line in place.  Followed by [facility] for Merepenem 1 gram every 8 hours until 03/12/17. Wound Vac in place.  Prescriptions are filled at [facility].  Temperature = 100.2. Hg 7.0 [place] [facility] [phone]

## 2017-03-03 NOTE — Consult Note (Signed)
[facility] Infectious Disease     Reason for Consult Leukocytosis   Referring Physician: Mayra Neer Date of Admission:  02/28/2017   Active Problems:   Fever   HPI: Anna [name] is a 51 y.o. female with metastatic cancer unknown primary admitted with leukocytosis and fevers. She has been on otpt iv meropenem but has had recurrent fevers and progressively increasing wbc up to over 50. She has not had much in the way of other sxs, with no abd pain, no diarrhea in her colostomy, no dysuria, no cough, sob. She is on [facility] for treatment. Repeat imaging done this admit shows no obvious abscess. Did have PICC associated dvt so picc site changed. Since admission started on vanco and fluc and continues on meropenem. Does not really feel any different.   Past Medical History:  Diagnosis Date  . Cancer ([place])   . Cancer with unknown primary site [facility])   . Colon polyp   . Diverticulosis   . Fibroid uterus   . Heme positive stool 08/01/2014  . HH (hiatus hernia)   . Large bowel perforation ([place]) 12/31/2016  . Metastatic adenocarcinoma ([place]) 12/20/2016  . Pelvic pain in female 01/13/2017  . Perforation of sigmoid colon [facility])    Past Surgical History:  Procedure Laterality Date  . COLONOSCOPY    . CYSTOSCOPY  12/20/2016   Procedure: CYSTOSCOPY;  Surgeon: Boykin Nearing, MD;  Location: [facility] ORS;  Service: Gynecology;;  . ESOPHAGOGASTRODUODENOSCOPY    . LAPAROSCOPIC BILATERAL SALPINGECTOMY Bilateral 12/20/2016   Procedure: LAPAROSCOPIC BILATERAL SALPINGOOPHERECTOMY,  Excision of anterior 5 cm calcified uterine fibroid;  Surgeon: Boykin Nearing, MD;  Location: [facility] ORS;  Service: Gynecology;  Laterality: Bilateral;  . LAPAROSCOPIC LYSIS OF ADHESIONS  12/20/2016   Procedure: LAPAROSCOPIC LYSIS OF ADHESIONS WITH DISSECTION OF LEFT TUBE AND OVARY.;  Surgeon: Olean Ree, MD;  Location: [facility] ORS;  Service: General;;  . LAPAROTOMY N/A 12/31/2016   Procedure: EXPLORATORY  LAPAROTOMY, sigmoid colectomy, removal of omentum, colostomy;  Surgeon: Jules Husbands, MD;  Location: [facility] ORS;  Service: General;  Laterality: N/A;  . SIGMOIDOSCOPY  12/20/2016   Procedure: RIGID SIGMOIDOSCOPY;  Surgeon: Olean Ree, MD;  Location: [facility] ORS;  Service: General;;  . TONSILLECTOMY     Social History  Substance Use Topics  . Smoking status: Never Smoker  . Smokeless tobacco: Never Used  . Alcohol use No   Family History  Problem Relation Age of Onset  . Colon cancer Mother 79  . Diabetes Mother   . Asthma Father   . Breast cancer Neg Hx     Allergies:  Allergies  Allergen Reactions  . Cefepime Rash  . Penicillins Rash    As a child. Has patient had a PCN reaction causing immediate rash, facial/tongue/throat swelling, SOB or lightheadedness with hypotension: Unknown Has patient had a PCN reaction causing severe rash involving mucus membranes or skin necrosis: Unknown Has patient had a PCN reaction that required hospitalization: No Has patient had a PCN reaction occurring within the last 10 years: No If all of the above answers are "NO", then may proceed with Cephalosporin use.     Current antibiotics: Antibiotics Given (last 72 hours)    Date/Time Action Medication Dose Rate   02/28/17 1757 New Bag/Given   meropenem (MERREM) 1 g in sodium chloride 0.9 % 100 mL IVPB 1 g 200 mL/hr   02/28/17 1837 New Bag/Given   vancomycin (VANCOCIN) IVPB 1000 mg/200 mL premix 1,000 mg 200 mL/hr  03/01/17 0259 New Bag/Given   meropenem (MERREM) 1 g in sodium chloride 0.9 % 100 mL IVPB 1 g 200 mL/hr   03/01/17 0457 New Bag/Given   vancomycin (VANCOCIN) IVPB 750 mg/150 ml premix 750 mg 150 mL/hr   03/01/17 0935 New Bag/Given   meropenem (MERREM) 1 g in sodium chloride 0.9 % 100 mL IVPB 1 g 200 mL/hr   03/01/17 1754 New Bag/Given   meropenem (MERREM) 1 g in sodium chloride 0.9 % 100 mL IVPB 1 g 200 mL/hr   03/01/17 1824 New Bag/Given   vancomycin (VANCOCIN) IVPB 750 mg/150  ml premix 750 mg 150 mL/hr   03/02/17 0152 New Bag/Given   meropenem (MERREM) 1 g in sodium chloride 0.9 % 100 mL IVPB 1 g 200 mL/hr   03/02/17 0439 New Bag/Given   vancomycin (VANCOCIN) IVPB 750 mg/150 ml premix 750 mg 150 mL/hr   03/02/17 0804 New Bag/Given   meropenem (MERREM) 1 g in sodium chloride 0.9 % 100 mL IVPB 1 g 200 mL/hr   03/02/17 1653 New Bag/Given   meropenem (MERREM) 1 g in sodium chloride 0.9 % 100 mL IVPB 1 g 200 mL/hr   03/02/17 1833 New Bag/Given   vancomycin (VANCOCIN) IVPB 750 mg/150 ml premix 750 mg 150 mL/hr   03/03/17 0222 New Bag/Given   meropenem (MERREM) 1 g in sodium chloride 0.9 % 100 mL IVPB 1 g 200 mL/hr   03/03/17 0600 New Bag/Given   vancomycin (VANCOCIN) IVPB 750 mg/150 ml premix 750 mg 150 mL/hr   03/03/17 1053 New Bag/Given   meropenem (MERREM) 1 g in sodium chloride 0.9 % 100 mL IVPB 1 g 200 mL/hr      MEDICATIONS: . apixaban  5 mg Oral BID  . diltiazem  240 mg Oral QHS  . feeding supplement (ENSURE ENLIVE)  237 mL Oral BID BM  . ferrous sulfate  325 mg Oral Q breakfast  . metoprolol succinate  50 mg Oral Daily    Review of Systems - 11 systems reviewed and negative per HPI   OBJECTIVE: Temp:  [98.4 F (36.9 C)-102.2 F (39 C)] 102.2 F (39 C) (07/09 1205) Pulse Rate:  [101-121] 101 (07/09 1205) Resp:  [18-20] 20 (07/09 1205) BP: (99-113)/(59-76) 103/64 (07/09 1205) SpO2:  [91 %-95 %] 95 % (07/09 1205) Physical Exam  Constitutional:  oriented to person, place, and time. Chronically ill appearing. HENT: [place]/AT, PERRLA, no scleral icterus Mouth/Throat: Oropharynx is clear and dry. No oropharyngeal exudate.  Cardiovascular: Tachy Pulmonary/Chest: decreased bs bil bases Neck = supple, no nuchal rigidity Abdominal: Soft. Bowel sounds are normal.  Midline incision with wound vac in place. Lymphadenopathy: no cervical adenopathy. No axillary adenopathy Neurological: alert and oriented to person, place, and time.  Skin: Skin is warm and  dry. No rash noted. No erythema.  Ext RUE edema Psychiatric: a normal mood and affect.  behavior is normal.    LABS: Results for orders placed or performed during the hospital encounter of 02/28/17 (from the past 48 hour(s))  Vancomycin, trough     Status: None   Collection Time: 03/02/17  4:41 PM  Result Value Ref Range   Vancomycin Tr 15 15 - 20 ug/mL  CBC     Status: Abnormal   Collection Time: 03/03/17  5:56 AM  Result Value Ref Range   WBC 46.3 (H) 3.6 - 11.0 K/uL   RBC 2.62 (L) 3.80 - 5.20 MIL/uL   Hemoglobin 7.0 (L) 12.0 - 16.0 g/dL   HCT  22.3 (L) 35.0 - 47.0 %   MCV 85.3 80.0 - 100.0 fL   MCH 26.6 26.0 - 34.0 pg   MCHC 31.1 (L) 32.0 - 36.0 g/dL   RDW 18.2 (H) 11.5 - 14.5 %   Platelets 636 (H) 150 - 440 K/uL  Basic metabolic panel     Status: Abnormal   Collection Time: 03/03/17  5:56 AM  Result Value Ref Range   Sodium 135 135 - 145 mmol/L   Potassium 3.5 3.5 - 5.1 mmol/L   Chloride 99 (L) 101 - 111 mmol/L   CO2 31 22 - 32 mmol/L   Glucose, Bld 100 (H) 65 - 99 mg/dL   BUN 8 6 - 20 mg/dL   Creatinine, Ser 0.56 0.44 - 1.00 mg/dL   Calcium 7.7 (L) 8.9 - 10.3 mg/dL   GFR calc non Af Amer >60 >60 mL/min   GFR calc Af Amer >60 >60 mL/min    Comment: (NOTE) The eGFR has been calculated using the CKD EPI equation. This calculation has not been validated in all clinical situations. eGFR's persistently <60 mL/min signify possible Chronic Kidney Disease.    Anion gap 5 5 - 15  Type and screen [facility]     Status: None (Preliminary result)   Collection Time: 03/03/17 10:30 AM  Result Value Ref Range   ABO/RH(D) A POS    Antibody Screen NEG    Sample Expiration 03/06/2017    Unit Number [id number]    Blood Component Type RED CELLS,LR    Unit division 00    Status of Unit ALLOCATED    Transfusion Status OK TO TRANSFUSE    Crossmatch Result Compatible   Prepare RBC     Status: None   Collection Time: 03/03/17 10:30 AM  Result Value Ref  Range   Order Confirmation ORDER PROCESSED BY BLOOD BANK    No components found for: ESR, C REACTIVE PROTEIN MICRO: Recent Results (from the past 720 hour(s))  CULTURE, BLOOD (ROUTINE X 2) w Reflex to ID Panel     Status: None   Collection Time: 02/02/17  5:00 AM  Result Value Ref Range Status   Specimen Description BLOOD BLOOD LEFT HAND  Final   Special Requests   Final    BOTTLES DRAWN AEROBIC AND ANAEROBIC Blood Culture results may not be optimal due to an inadequate volume of blood received in culture bottles   Culture NO GROWTH 5 DAYS  Final   Report Status 02/07/2017 FINAL  Final  CULTURE, BLOOD (ROUTINE X 2) w Reflex to ID Panel     Status: None   Collection Time: 02/02/17  5:15 AM  Result Value Ref Range Status   Specimen Description BLOOD BLOOD RIGHT ARM  Final   Special Requests   Final    BOTTLES DRAWN AEROBIC AND ANAEROBIC Blood Culture results may not be optimal due to an inadequate volume of blood received in culture bottles   Culture NO GROWTH 5 DAYS  Final   Report Status 02/07/2017 FINAL  Final  Culture, blood (routine x 2)     Status: None (Preliminary result)   Collection Time: 02/27/17 12:45 PM  Result Value Ref Range Status   Specimen Description BLOOD LEFT HAND  Final   Special Requests   Final    BOTTLES DRAWN AEROBIC AND ANAEROBIC 8 CC AEROBIC, 6CC ANAEROBIC   Culture NO GROWTH 4 DAYS  Final   Report Status PENDING  Incomplete  Culture, blood (routine x 2)  Status: None (Preliminary result)   Collection Time: 02/27/17 12:50 PM  Result Value Ref Range Status   Specimen Description BLOOD RIGHT ARM  Final   Special Requests   Final    BOTTLES DRAWN AEROBIC AND ANAEROBIC 10 CC AEROBIC, 9 CC ANAEROBIC   Culture NO GROWTH 4 DAYS  Final   Report Status PENDING  Incomplete  Urine culture     Status: None   Collection Time: 02/28/17  1:15 PM  Result Value Ref Range Status   Specimen Description URINE, CLEAN CATCH  Final   Special Requests NONE  Final    Culture   Final    NO GROWTH Performed at [facility], [place] [address] [place], [place] [address]    Report Status 03/01/2017 FINAL  Final  C difficile quick scan w PCR reflex     Status: Abnormal   Collection Time: 03/01/17  1:47 PM  Result Value Ref Range Status   C Diff antigen POSITIVE (A) NEGATIVE Final   C Diff toxin NEGATIVE NEGATIVE Final   C Diff interpretation Results are indeterminate. See PCR results.  Final  Clostridium Difficile by PCR     Status: None   Collection Time: 03/01/17  1:47 PM  Result Value Ref Range Status   Toxigenic C Difficile by pcr NEGATIVE NEGATIVE Final    Comment: Patient is colonized with non toxigenic C. difficile. May not need treatment unless significant symptoms are present.    IMAGING: Ct Chest W Contrast  Result Date: 02/27/2017 CLINICAL DATA:  Fever and cough for several weeks. History of carcinoma of unknown primary. Right arm swelling starting last night. EXAM: CT CHEST, ABDOMEN, AND PELVIS WITH CONTRAST TECHNIQUE: Multidetector CT imaging of the chest, abdomen and pelvis was performed following the standard protocol during bolus administration of intravenous contrast. CONTRAST:  171m ISOVUE-300 IOPAMIDOL (ISOVUE-300) INJECTION 61% COMPARISON:  01/29/2017 FINDINGS: CT CHEST FINDINGS Cardiovascular: Heart size upper normal. No pericardial effusion. Right PICC line tip is positioned at the SVC/RA junction. Mediastinum/Nodes: Scattered small to borderline enlarged mediastinal and hilar lymph nodes are noted bilaterally. There is no hilar lymphadenopathy. The esophagus has normal imaging features. Lungs/Pleura: Small bilateral pleural effusions are new in the interval. 12 mm posterior right upper lobe pleural-based nodule has progressed since prior study. Innumerable ill-defined and irregular pulmonary nodules are again identified. 10 mm left upper lobe nodule image 31 series 4 was 10 mm on the prior study. 13 mm right middle lobe nodule on today's  study was 16 mm on the prior study. An 8 mm nodule seen in the lingula (image 84 series 4) was 8 mm previously. There is bilateral dependent collapse/consolidation. Musculoskeletal: Bone windows reveal no worrisome lytic or sclerotic osseous lesions. CT ABDOMEN PELVIS FINDINGS Hepatobiliary: Multiple hepatic lesions are again identified. 3.5 cm lesion in the medial segment left liver was 2.8 cm on the prior study and is more necrotic on today's exam. 2.6 cm lesion in the lateral segment of the left liver was 2.6 cm previously. 3.5 cm lesion inferior aspect of the medial segment left liver, adjacent to the gallbladder measured only 1.6 cm on the prior study. Multiple new liver lesions are seen in the dome of the right liver (compare image 40 series 2 today to image 15 series 11 previously. Gallbladder wall thickening or pericholecystic fluid noted. No intrahepatic or extrahepatic biliary dilation. Pancreas: No focal mass lesion. No dilatation of the main duct. No intraparenchymal cyst. No peripancreatic edema. Spleen: No splenomegaly. No  focal mass lesion. Adrenals/Urinary Tract: No adrenal nodule or mass. Right kidney unremarkable. Asymmetrically decreased perfusion noted to the left kidney and there is mild left hydroureteronephrosis. Left ureter remains dilated into the pelvis where abnormal soft tissue is identified along the left pelvic sidewall. Stomach/Bowel: Stomach is nondistended. No gastric wall thickening. No evidence of outlet obstruction. Duodenum is normally positioned as is the ligament of Treitz. No small bowel wall thickening. No small bowel dilatation. The terminal ileum is normal. The appendix is normal. No gross colonic mass. No colonic wall thickening. No substantial diverticular change. Left lower quadrant sigmoid end colostomy noted. Vascular/Lymphatic: No abdominal aortic aneurysm. Mild hepatoduodenal ligament lymphadenopathy appears progressed in the interval. Necrotic left periaortic lymph  node just caudal to the left renal vein is 15 mm short axis today which compares to 14 mm previously. A second 13 mm left para-aortic lymph node seen on image 66 was 8 mm previously. Reproductive: Irregular and heterogeneous soft tissue again incorporates the uterus as seen on multiple prior studies. Neoplastic involvement is a distinct consideration in this process extends to the sidewall of the pelvis bilaterally in accounts for the evolving left hydroureteronephrosis. Other: Small volume intraperitoneal free fluid. Musculoskeletal: Bone windows reveal no worrisome lytic or sclerotic osseous lesions. IMPRESSION: 1. Aside from the presence of the right-sided PICC line, no IV 6 bone a shin for the patient's reported history of acute onset right upper extremity swelling. There may be some thrombus along the PICC line in the subclavian vein, but this is not well assessed because of the streak artifact from the PICC line itself. 2. Clear interval progression of some of the hepatic metastases. 3. The innumerable irregular pulmonary show no clear trend towards progression or regression. There is a new pleural-based nodule in the right apex and other nodules measure minimally larger and smaller since prior study. 4. There is new bibasilar collapse/consolidation with new small bilateral pleural effusions. 5. New mild lymphadenopathy in the hepatoduodenal ligament with stable to progression of para-aortic upper abdominal lymphadenopathy. 6. Similar irregular soft tissue in the pelvis involving the uterus with extension to the sidewalls bilaterally. Incorporation of the distal left ureter on the left results and mild left hydroureteronephrosis and decreased perfusion to the left kidney on today's study is consistent with a component of associated obstructive uropathy. Electronically Signed   By: Kennith Center M.D.   On: 02/27/2017 18:06   Ct Abdomen Pelvis W Contrast  Result Date: 02/27/2017 CLINICAL DATA:  Fever and  cough for several weeks. History of carcinoma of unknown primary. Right arm swelling starting last night. EXAM: CT CHEST, ABDOMEN, AND PELVIS WITH CONTRAST TECHNIQUE: Multidetector CT imaging of the chest, abdomen and pelvis was performed following the standard protocol during bolus administration of intravenous contrast. CONTRAST:  ISOVUE-300 IOPAMIDOL (ISOVUE-300) INJECTION 61% COMPARISON:  01/29/2017 FINDINGS: CT CHEST FINDINGS Cardiovascular: Heart size upper normal. No pericardial effusion. Right PICC line tip is positioned at the SVC/RA junction. Mediastinum/Nodes: Scattered small to borderline enlarged mediastinal and hilar lymph nodes are noted bilaterally. There is no hilar lymphadenopathy. The esophagus has normal imaging features. Lungs/Pleura: Small bilateral pleural effusions are new in the interval. 12 mm posterior right upper lobe pleural-based nodule has progressed since prior study. Innumerable ill-defined and irregular pulmonary nodules are again identified. 10 mm left upper lobe nodule image 31 series 4 was 10 mm on the prior study. 13 mm right middle lobe nodule on today's study was 16 mm on the prior study. An 8  mm nodule seen in the lingula (image 84 series 4) was 8 mm previously. There is bilateral dependent collapse/consolidation. Musculoskeletal: Bone windows reveal no worrisome lytic or sclerotic osseous lesions. CT ABDOMEN PELVIS FINDINGS Hepatobiliary: Multiple hepatic lesions are again identified. 3.5 cm lesion in the medial segment left liver was 2.8 cm on the prior study and is more necrotic on today's exam. 2.6 cm lesion in the lateral segment of the left liver was 2.6 cm previously. 3.5 cm lesion inferior aspect of the medial segment left liver, adjacent to the gallbladder measured only 1.6 cm on the prior study. Multiple new liver lesions are seen in the dome of the right liver (compare image 40 series 2 today to image 15 series 11 previously. Gallbladder wall thickening or  pericholecystic fluid noted. No intrahepatic or extrahepatic biliary dilation. Pancreas: No focal mass lesion. No dilatation of the main duct. No intraparenchymal cyst. No peripancreatic edema. Spleen: No splenomegaly. No focal mass lesion. Adrenals/Urinary Tract: No adrenal nodule or mass. Right kidney unremarkable. Asymmetrically decreased perfusion noted to the left kidney and there is mild left hydroureteronephrosis. Left ureter remains dilated into the pelvis where abnormal soft tissue is identified along the left pelvic sidewall. Stomach/Bowel: Stomach is nondistended. No gastric wall thickening. No evidence of outlet obstruction. Duodenum is normally positioned as is the ligament of Treitz. No small bowel wall thickening. No small bowel dilatation. The terminal ileum is normal. The appendix is normal. No gross colonic mass. No colonic wall thickening. No substantial diverticular change. Left lower quadrant sigmoid end colostomy noted. Vascular/Lymphatic: No abdominal aortic aneurysm. Mild hepatoduodenal ligament lymphadenopathy appears progressed in the interval. Necrotic left periaortic lymph node just caudal to the left renal vein is 15 mm short axis today which compares to 14 mm previously. A second 13 mm left para-aortic lymph node seen on image 66 was 8 mm previously. Reproductive: Irregular and heterogeneous soft tissue again incorporates the uterus as seen on multiple prior studies. Neoplastic involvement is a distinct consideration in this process extends to the sidewall of the pelvis bilaterally in accounts for the evolving left hydroureteronephrosis. Other: Small volume intraperitoneal free fluid. Musculoskeletal: Bone windows reveal no worrisome lytic or sclerotic osseous lesions. IMPRESSION: 1. Aside from the presence of the right-sided PICC line, no IV 6 bone a shin for the patient's reported history of acute onset right upper extremity swelling. There may be some thrombus along the PICC line in  the subclavian vein, but this is not well assessed because of the streak artifact from the PICC line itself. 2. Clear interval progression of some of the hepatic metastases. 3. The innumerable irregular pulmonary show no clear trend towards progression or regression. There is a new pleural-based nodule in the right apex and other nodules measure minimally larger and smaller since prior study. 4. There is new bibasilar collapse/consolidation with new small bilateral pleural effusions. 5. New mild lymphadenopathy in the hepatoduodenal ligament with stable to progression of para-aortic upper abdominal lymphadenopathy. 6. Similar irregular soft tissue in the pelvis involving the uterus with extension to the sidewalls bilaterally. Incorporation of the distal left ureter on the left results and mild left hydroureteronephrosis and decreased perfusion to the left kidney on today's study is consistent with a component of associated obstructive uropathy. Electronically Signed   By: Misty Stanley M.D.   On: 02/27/2017 18:06   US Venous Img Upper Uni Right  Result Date: 02/27/2017 CLINICAL DATA:  Right upper extremity edema. EXAM: RIGHT UPPER EXTREMITY VENOUS DOPPLER ULTRASOUND  TECHNIQUE: Gray-scale sonography with graded compression, as well as color Doppler and duplex ultrasound were performed to evaluate the upper extremity deep venous system from the level of the subclavian vein and including the jugular, axillary, basilic, radial, ulnar and upper cephalic vein. Spectral Doppler was utilized to evaluate flow at rest and with distal augmentation maneuvers. COMPARISON:  None. FINDINGS: Contralateral Subclavian Vein: Respiratory phasicity is normal and symmetric with the symptomatic side. No evidence of thrombus. Normal compressibility. Internal Jugular Vein: No evidence of thrombus. Normal compressibility, respiratory phasicity and response to augmentation. Subclavian Vein: There is a nonocclusive thrombus within the  distal subclavian vein, extending into the axillary vein and brachial vein branch. Cephalic Vein: No evidence of thrombus. Normal compressibility, respiratory phasicity and response to augmentation. Basilic Vein: Partially visualized due to bandage obscuring it. No evidence of thrombus where visualized. Normal compressibility, respiratory phasicity and response to augmentation. Radial Veins: No evidence of thrombus. Normal compressibility, respiratory phasicity and response to augmentation. Ulnar Veins: No evidence of thrombus. Normal compressibility, respiratory phasicity and response to augmentation. Venous Reflux:  None visualized. Other Findings: PICC line originating within the brachials vein branch associated with partially occlusive thrombus. 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 These results will be called to the ordering clinician or representative by the Radiologist Assistant, and communication documented in the PACS or zVision Dashboard. Electronically Signed   By: Fidela Salisbury M.D.   On: 02/27/2017 18:11   Dg Chest Port 1 View  Result Date: 02/28/2017 CLINICAL DATA:  PICC line placement EXAM: PORTABLE CHEST 1 VIEW COMPARISON:  Portable exam 1230 hours compared to 02/27/2017 FINDINGS: LEFT arm PICC line tip projects over RIGHT atrium; recommend withdrawal 3.0 cm to place tip in SVC near the cavoatrial junction. Normal heart size, mediastinal contours and pulmonary vascularity for technique. Bibasilar effusions and atelectasis greater on RIGHT. No pneumothorax. Bones demineralized. IMPRESSION: Recommend withdrawal of LEFT arm PICC line 3.0 cm to place tip in SVC near the cavoatrial junction. Bibasilar atelectasis and pleural effusions greater on RIGHT. Findings called to Idamae Lusher RN on 02/28/2017 at 1243 hrs. Electronically Signed   By: Lavonia Dana M.D.   On:  02/28/2017 12:44    Assessment:   Karrina Lye is a 51 y.o. female with metastatic cancer recently dxed, on [place] and on IV meroepenem as otpt for persistent pelvic abscess, now admitted with wbc up to 50s and daily temps. Her CT chest shows mets, possible consolidation and bil  Effusion CT abd shows multiple mets, some necrotic appearing in liver.  L hydronephrosis, necrotic L periaortic LN and an irregular and heterogeneous soft tissue mass incorporating the uterus She has been seen by urology and they do not believe etiology of leukocytosis is related to the hydronephrosis.  He UA is unimpressive.  She has a colostomy but no report of diarrhea and no colitis noted on CT scan so positive C diff ag with neg PCR and toxin is not likely significant I suspect her leukomoid reaction is due to necrotic progressive tumor. She is on Keytruda.   Recommendations Continue current meropenem, vanco and fluconazole Can dc on these meds if stable or if all cxs remain negative and no obvious etiology could consider changing or oral regimen for intraabdominal infection and remove PICC line and follow as otpt.  Will discuss further with oncology.  Thank you very much for allowing me to participate  in the care of this patient. Please call with questions.   Cheral Marker. Ola Spurr, MD

## 2017-03-03 NOTE — Plan of Care (Signed)
Problem: Nutrition: Goal: Adequate nutrition will be maintained Outcome: Not Progressing Pt unable to take more than 2 or 3 bites of anything at a time. Family encouraged to bring pt anything she thinks she can eat.

## 2017-03-03 NOTE — Progress Notes (Signed)
[facility]  Patient Status: Active  [facility] is providing the following services: SN & IV ABX (merepenem 1g q8 until 7/18)  If patient discharges after hours, please call [phone].   Anna [name] 03/03/2017, 9:32 AM

## 2017-03-04 DIAGNOSIS — Z7189 Other specified counseling: Secondary | ICD-10-CM

## 2017-03-04 DIAGNOSIS — Z515 Encounter for palliative care: Secondary | ICD-10-CM

## 2017-03-04 DIAGNOSIS — C799 Secondary malignant neoplasm of unspecified site: Secondary | ICD-10-CM

## 2017-03-04 LAB — BASIC METABOLIC PANEL
Anion gap: 7 (ref 5–15)
BUN: 10 mg/dL (ref 6–20)
CALCIUM: 7.8 mg/dL — AB (ref 8.9–10.3)
CO2: 29 mmol/L (ref 22–32)
CREATININE: 0.79 mg/dL (ref 0.44–1.00)
Chloride: 101 mmol/L (ref 101–111)
GFR calc non Af Amer: >60 mL/min (ref >60)
GLUCOSE: 107 mg/dL — AB (ref 65–99)
Potassium: 3.6 mmol/L (ref 3.5–5.1)
Sodium: 137 mmol/L (ref 135–145)

## 2017-03-04 LAB — BPAM RBC
Blood Product Expiration Date: 201807192359
ISSUE DATE / TIME: 201807091540
UNIT TYPE AND RH: 6200

## 2017-03-04 LAB — TYPE AND SCREEN
ABO/RH(D): A POS
ANTIBODY SCREEN: NEGATIVE
UNIT DIVISION: 0

## 2017-03-04 LAB — CBC
HEMATOCRIT: 28.3 % — AB (ref 35.0–47.0)
Hemoglobin: 9.2 g/dL — ABNORMAL LOW (ref 12.0–16.0)
MCH: 27 pg (ref 26.0–34.0)
MCHC: 32.5 g/dL (ref 32.0–36.0)
MCV: 83 fL (ref 80.0–100.0)
PLATELETS: 677 10*3/uL — AB (ref 150–440)
RBC: 3.4 MIL/uL — ABNORMAL LOW (ref 3.80–5.20)
RDW: 17 % — AB (ref 11.5–14.5)
WBC: 58.8 10*3/uL (ref 3.6–11.0)

## 2017-03-04 LAB — CULTURE, BLOOD (ROUTINE X 2)
Culture: NO GROWTH
Culture: NO GROWTH

## 2017-03-04 MED ORDER — DOCUSATE SODIUM 100 MG PO CAPS: 100.0000 mg | ORAL_CAPSULE | Freq: Every day | ORAL | 0 refills | Status: AC | PRN

## 2017-03-04 MED ORDER — VANCOMYCIN HCL IN DEXTROSE 750-5 MG/150ML-% IV SOLN: 750.0000 mg | Freq: Two times a day (BID) | INTRAVENOUS | Status: DC

## 2017-03-04 MED ORDER — FLUCONAZOLE 200 MG PO TABS: 200.0000 mg | ORAL_TABLET | Freq: Every day | ORAL | 0 refills | Status: AC

## 2017-03-04 MED ORDER — ENSURE ENLIVE PO LIQD: 237.0000 mL | Freq: Two times a day (BID) | ORAL | 0 refills | Status: AC

## 2017-03-04 MED ORDER — FLUCONAZOLE 200 MG PO TABS
200.0000 mg | ORAL_TABLET | Freq: Every day | ORAL | Status: DC
  Filled 2017-03-04: qty 1

## 2017-03-04 MED ORDER — MEROPENEM IV (FOR PTA / DISCHARGE USE ONLY): 1.0000 g | Freq: Three times a day (TID) | INTRAVENOUS | 0 refills | Status: AC

## 2017-03-04 MED ORDER — VANCOMYCIN IV (FOR PTA / DISCHARGE USE ONLY): 750.0000 mg | Freq: Two times a day (BID) | INTRAVENOUS | 0 refills | Status: AC

## 2017-03-04 MED ORDER — DILTIAZEM HCL ER COATED BEADS 240 MG PO CP24: 240.0000 mg | ORAL_CAPSULE | Freq: Every day | ORAL | Status: AC

## 2017-03-04 MED ORDER — FLUCONAZOLE 200 MG PO TABS: 200.0000 mg | ORAL_TABLET | Freq: Every day | ORAL | Status: DC

## 2017-03-04 NOTE — Progress Notes (Signed)
New referral for Home Palliative services received from [facility]. Referral made aware. Thank you. Flo Shanks RN, BSN, [facility] Liaison [phone] c

## 2017-03-04 NOTE — Progress Notes (Signed)
Pt is being discharged today. Discharge paperwork was reviewed with the patient and the spouse, both verified understanding. All belongings were packed and returned to the patient. Picc line was left in the left upper arm for prolonged IV infusion. Pt also discharged with her home wound vac. She was rolled out in a wheelchair by staff.

## 2017-03-04 NOTE — Discharge Summary (Signed)
[facility] [place] NAME: Anna [name]    MR#:  [id number]  DATE OF BIRTH:  10-14-1965  DATE OF ADMISSION:  02/28/2017 ADMITTING PHYSICIAN: Hillary Bow, MD  DATE OF DISCHARGE: 03/04/2017  2:27 PM  PRIMARY CARE PHYSICIAN: McLean-Scocuzza, Nino Glow, MD    ADMISSION DIAGNOSIS:  sepsis  DISCHARGE DIAGNOSIS:  Active Problems:   Fever   SECONDARY DIAGNOSIS:   Past Medical History:  Diagnosis Date  . Cancer ([place])   . Cancer with unknown primary site [facility])   . Colon polyp   . Diverticulosis   . Fibroid uterus   . Heme positive stool 08/01/2014  . HH (hiatus hernia)   . Large bowel perforation ([place]) 12/31/2016  . 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 Pelvic pain in female 01/13/2017  . Perforation of sigmoid colon [facility])     HOSPITAL COURSE:   1. Clinical sepsis with fever tachycardia and leukocytosis. Case discussed with infectious disease specialist Dr. Ola Spurr and with patient and we decided on IV meropenem and IV vancomycin to go home with. Oral fluconazole. These will be continued for another 2 weeks and reevaluation at that time. Her fever is likely due to her metastatic cancer. PICC line care as per protocol with home health agency. White blood cell count remained very elevated during the entire hospital course. 2. Anemia of chronic disease. Hemoglobin drifted down yesterday to 7.0 and she was transfused with good response. Hemoglobin came up to 9.2. 3. Persistent tachycardia on metoprolol and Cardizem. Patient hesitant on going up and any doses 4. Left hydronephrosis seen by urology and no further interventions offered 5. Metastatic carcinoma of unknown primary. Appreciate oncology opinion. Overall prognosis is poor. I did have discussions with the patient and family about her status. Patient made a DO NOT RESUSCITATE by palliative care team. At this time, the patient would like to continue her [facility] with oncology.  6. DVT  on Eliquis 7. Wound VAC for abdomen 8. Home health set up for IV antibiotics and physical therapy. 9. Palliative care should follow-up as outpatient  DISCHARGE CONDITIONS:   Guarded  CONSULTS OBTAINED:  Treatment Team:  Nickie Retort, MD  DRUG ALLERGIES:   Allergies  Allergen Reactions  . Cefepime Rash  . Penicillins Rash    As a child. Has patient had a PCN reaction causing immediate rash, facial/tongue/throat swelling, SOB or lightheadedness with hypotension: Unknown Has patient had a PCN reaction causing severe rash involving mucus membranes or skin necrosis: Unknown Has patient had a PCN reaction that required hospitalization: No Has patient had a PCN reaction occurring within the last 10 years: No If all of the above answers are "NO", then may proceed with Cephalosporin use.     DISCHARGE MEDICATIONS:   Discharge Medication List as of 03/04/2017 12:47 PM    START taking these medications   Details  docusate sodium (COLACE) 100 MG capsule Take 1 capsule (100 mg total) by mouth daily as needed., Starting Tue 03/04/2017, Until Wed 03/04/2018, Print    feeding supplement, ENSURE ENLIVE, (ENSURE ENLIVE) LIQD Take 237 mLs by mouth 2 (two) times daily between meals., Starting Tue 03/04/2017, Print    meropenem (MERREM) IVPB Inject 1 g into the vein every 8 (eight) hours. Indication:  Intra abdominal abscess Last Day of Therapy:  7/24 Labs - Once weekly:  CBC/D and BMP, Labs - Every other week:  ESR and CRP, Starting Tue 03/04/2017, Until Tue 03/18/2017, Print  vancomycin IVPB Inject 750 mg into the vein every 12 (twelve) hours. Indication:  Intra abdominal abscess Last Day of Therapy:  03/18/17 Labs - Sunday/Monday:  CBC/D, BMP, and vancomycin trough. Labs - Thursday:  BMP and vancomycin trough Labs - Every other week:  ES R and CRP, Starting Tue 03/04/2017, Until Tue 03/18/2017, Print      CONTINUE these medications which have CHANGED   Details  diltiazem (CARDIZEM  CD) 240 MG 24 hr capsule Take 1 capsule (240 mg total) by mouth at bedtime., Starting Tue 03/04/2017, No Print    fluconazole (DIFLUCAN) 200 MG tablet Take 1 tablet (200 mg total) by mouth daily at 6 PM., Starting Tue 03/04/2017, Print      CONTINUE these medications which have NOT CHANGED   Details  acetaminophen (TYLENOL) 500 MG tablet Take 500 mg by mouth as needed for mild pain., Historical Med    apixaban (ELIQUIS) 5 MG TABS tablet Take 1 tablet (5 mg total) by mouth 2 (two) times daily., Starting Tue 02/04/2017, Print    ferrous sulfate 325 (65 FE) MG tablet Take 1 tablet (325 mg total) by mouth daily with breakfast., Starting Wed 02/05/2017, Print    metoprolol tartrate (LOPRESSOR) 50 MG tablet Take 50 mg by mouth daily., Historical Med    potassium chloride SA (K-DUR,KLOR-CON) 20 MEQ tablet Take 1 tablet (20 mEq total) by mouth 2 (two) times daily., Starting Thu 02/27/2017, Normal      STOP taking these medications     meropenem 1 g in sodium chloride 0.9 % 100 mL      diphenhydrAMINE (BENADRYL) 25 mg capsule      ondansetron (ZOFRAN) 4 MG tablet      oxyCODONE-acetaminophen (ROXICET) 5-325 MG tablet      Vancomycin (VANCOCIN) 750-5 MG/150ML-% SOLN          DISCHARGE INSTRUCTIONS:   Follow-up with PMD one week Follow-up with oncology Follow-up with palliative care as outpatient  If you experience worsening of your admission symptoms, develop shortness of breath, life threatening emergency, suicidal or homicidal thoughts you must seek medical attention immediately by calling 911 or calling your MD immediately  if symptoms less severe.  You Must read complete instructions/literature along with all the possible adverse reactions/side effects for all the Medicines you take and that have been prescribed to you. Take any new Medicines after you have completely understood and accept all the possible adverse reactions/side effects.   Please note  You were cared for by a  hospitalist during your hospital stay. If you have any questions about your discharge medications or the care you received while you were in the hospital after you are discharged, you can call the unit and asked to speak with the hospitalist on call if the hospitalist that took care of you is not available. Once you are discharged, your primary care physician will handle any further medical issues. Please note that NO REFILLS for any discharge medications will be authorized once you are discharged, as it is imperative that you return to your primary care physician (or establish a relationship with a primary care physician if you do not have one) for your aftercare needs so that they can reassess your need for medications and monitor your lab values.    Today   CHIEF COMPLAINT:  Sent in for sepsis  HISTORY OF PRESENT ILLNESS:  Anna [name]  is a 51 y.o. female with a known history of Metastatic cancer unknown primary   VITAL SIGNS:  Blood pressure 112/69, pulse (!) 115, temperature 98.8 F (37.1 C), temperature source Oral, resp. rate 20, height 5' 3" (1.6 m), weight 57.3 kg (126 lb 6.4 oz), last menstrual period 03/01/2015, SpO2 93 %.    PHYSICAL EXAMINATION:  GENERAL:  51 y.o.-year-old patient lying in the bed with no acute distress.  EYES: Pupils equal, round, reactive to light and accommodation. No scleral icterus. Extraocular muscles intact.  HEENT: Head atraumatic, normocephalic. Oropharynx and nasopharynx clear.  NECK:  Supple, no jugular venous distention. No thyroid enlargement, no tenderness.  LUNGS: Normal breath sounds bilaterally, no wheezing, rales,rhonchi or crepitation. No use of accessory muscles of respiration.  CARDIOVASCULAR: S1, S2 tachycardic. No murmurs, rubs, or gallops.  ABDOMEN: Soft, tender, non-distended. Bowel sounds present. No organomegaly or mass.  EXTREMITIES: Trace pedal edema, no cyanosis, or clubbing.  NEUROLOGIC: Cranial nerves II through XII are  intact. Muscle strength 5/5 in all extremities. Sensation intact. Gait not checked.  PSYCHIATRIC: The patient is alert and oriented x 3.  SKIN: No obvious rash, lesion, or ulcer.   DATA REVIEW:   CBC  Recent Labs Lab 03/04/17 0440  WBC 58.8*  HGB 9.2*  HCT 28.3*  PLT 677*    Chemistries   Recent Labs Lab 02/28/17 1825  03/04/17 0440  NA 133*  < > 137  K 3.9  < > 3.6  CL 97*  < > 101  CO2 27  < > 29  GLUCOSE 153*  < > 107*  BUN 8  < > 10  CREATININE 0.70  < > 0.79  CALCIUM 7.9*  < > 7.8*  MG 1.7  --   --   AST 39  --   --   ALT 29  --   --   ALKPHOS 143*  --   --   BILITOT 0.5  --   --   < > = values in this interval not displayed.  Cardiac Enzymes No results for input(s): TROPONINI in the last 168 hours.  Microbiology Results  Results for orders placed or performed during the hospital encounter of 02/28/17  C difficile quick scan w PCR reflex     Status: Abnormal   Collection Time: 03/01/17  1:47 PM  Result Value Ref Range Status   C Diff antigen POSITIVE (A) NEGATIVE Final   C Diff toxin NEGATIVE NEGATIVE Final   C Diff interpretation Results are indeterminate. See PCR results.  Final  Clostridium Difficile by PCR     Status: None   Collection Time: 03/01/17  1:47 PM  Result Value Ref Range Status   Toxigenic C Difficile by pcr NEGATIVE NEGATIVE Final    Comment: Patient is colonized with non toxigenic C. difficile. May not need treatment unless significant symptoms are present.      Management plans discussed with the patient, family and they are in agreement.  CODE STATUS:     Code Status Orders        Start     Ordered   03/04/17 1038  Do not attempt resuscitation (DNR)  Continuous    Question Answer Comment  In the event of cardiac or respiratory ARREST Do not call a "code blue"   In the event of cardiac or respiratory ARREST Do not perform Intubation, CPR, defibrillation or ACLS   In the event of cardiac or respiratory ARREST Use  medication by any route, position, wound care, and other measures to relive pain and suffering. May use oxygen, suction and manual treatment of  airway obstruction as needed for comfort.      03/04/17 1037    Code Status History    Date Active Date Inactive Code Status Order ID Comments User Context   02/28/2017  4:47 PM 03/04/2017 10:37 AM Full Code [id number]  Dustin Flock, MD Inpatient   01/30/2017 12:09 AM 02/04/2017 10:43 PM Full Code [id number]  [place], Ubaldo Glassing, DO Inpatient   01/21/2017  9:00 PM 01/24/2017  5:38 PM Full Code [id number]  Fritzi Mandes, MD Inpatient   12/31/2016  3:58 AM 01/10/2017  4:29 PM Full Code [id number]  Jules Husbands, MD Inpatient   12/30/2016 10:48 PM 12/31/2016  3:58 AM Full Code [id number]  Schermerhorn, Gwen Her, MD ED   12/20/2016 11:16 AM 12/21/2016  1:51 PM Full Code [id number]  Schermerhorn, Gwen Her, MD Inpatient      TOTAL TIME TAKING CARE OF THIS PATIENT: 35 minutes.    Loletha Grayer M.D on 03/04/2017 at 4:48 PM  Between 7am to 6pm - Pager - [phone]  After 6pm go to [email or web address] - password [email or web address] [facility]  [phone]  CC: Primary care physician; McLean-Scocuzza, Nino Glow, MD

## 2017-03-04 NOTE — Consult Note (Signed)
Consultation Note Date: 03/04/17  Patient Name: Anna [name]  DOB: 04-Feb-1966  MRN: [id number]  Age / Sex: 51 y.o., female  PCP: McLean-Scocuzza, Nino Glow, MD Referring Physician: No att. providers found  Reason for Consultation: Establishing goals of care  HPI/Patient Profile: 51 y.o. female  with past medical history of metastatic cancer of unknown origin admitted on 02/28/2017 with fevers and leukocytosis. In May 2018, patient with colon perforation s/p colostomy secondary to carcinoma of unknown origin. Recent hospitalization for intra-abdominal abscesses requiring drain placement and IV antibiotics. Readmitted shortly after with worsening leukocytosis and fevers. CT revealed worsening metastatic disease with positive blood cultures. Followed by Dr. Rogue Bussing. Notes reviewed. Receiving Keytruda. This admit, patient found to have right arm DVT likely due to PICC line. Removed and placed on left. Infectious disease following. Poor prognosis. Palliative medicine consultation for goals of care.    Clinical Assessment and Goals of Care: I have reviewed medical records, discussed with care team, and met with patient and husband Anna [name]) at to discuss diagnosis, [place], EOL wishes, disposition and options.  Introduced Palliative Medicine as specialized medical care for people living with serious illness. It focuses on providing relief from the symptoms and stress of a serious illness. The goal is to improve quality of life for both the patient and the family.  We discussed a brief life review of the patient. Married to Ward for over 20 years. No children or pets. Healthy, independent individual up until recent diagnosis of metastatic cancer. Denies pain or symptoms of discomfort. Is able to ambulate short distances without walker. She has had a poor appetite.   Discussed hospital diagnoses, interventions, and  underlying metastatic cancer of unknown origin. Her and Anna [name] have had multiple conversations with Dr. Rogue Bussing and understand "this is terminal." Jiayi and Anna [name] become very tearful during the conversation. "We haven't really talked about it." "Still trying to process." Spiritual individuals with support from church community.   I attempted to elicit values and goals of care important to the patient. Being home with husband is priority but she is hopeful to continue Keytruda until Dr. Rogue Bussing no longer feels this is an option.   Advanced directives, concepts specific to code status, artifical feeding and hydration, and rehospitalization were considered and discussed. Educated on medical recommendation for DNR/DNI with underlying cancer and unlikelihood of meaningful survival. Educated on MOST form. After discussion, patient and husband agree with DNR/DNI and we completed MOST form. At this point, she would like to continue coming back to the hospital if necessary.   Palliative Care and hospice services outpatient were explained and offered. Patient agreeable with palliative to follow on discharge understanding this can transition to hospice services when they are ready.   Questions and concerns were addressed.  Hard Choices booklet left for review. Therapeutic listening and emotional/spiritual support provided.     SUMMARY OF RECOMMENDATIONS    After [place] discussion, patient/husband decide on DNR/DNI. Durable completed and placed in chart.   Educated and completed MOST form. Re-hospitalization if  necessary.   Patient/husband hopeful to continue Keytruda until this is no longer an option.   Educated on palliative and hospice services. Agreeable with palliative to follow on discharge understanding this can transition to hospice services when ready. Likely discharge today per attending.   Code Status/Advance Care Planning:  DNR  Symptom Management:   Per attending  Palliative  Prophylaxis:   Frequent Pain Assessment and Palliative Wound Care  Additional Recommendations (Limitations, Scope, Preferences):  Full Scope Treatment-except DNR/DNI  Psycho-social/Spiritual:   Desire for further Chaplaincy support: yes  Additional Recommendations: Caregiving  Support/Resources, Compassionate Wean Education and Education on Hospice  Prognosis:   Unable to determine: guarded with metastatic cancer of unknown origin, recurrent fevers and leukocytosis  Discharge Planning: Home with Home Health palliative services to follow       Primary Diagnoses: Present on Admission: . Fever   I have reviewed the medical record, interviewed the patient and family, and examined the patient. The following aspects are pertinent.  Past Medical History:  Diagnosis Date  . Cancer ([place])   . Cancer with unknown primary site [facility])   . Colon polyp   . Diverticulosis   . Fibroid uterus   . Heme positive stool 08/01/2014  . HH (hiatus hernia)   . Large bowel perforation ([place]) 12/31/2016  . 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 Pelvic pain in female 01/13/2017  . Perforation of sigmoid colon [facility])    Social History   Social History  . Marital status: Married    Spouse name: N/A  . Number of children: N/A  . Years of education: N/A   Social History Main Topics  . Smoking status: Never Smoker  . Smokeless tobacco: Never Used  . Alcohol use No  . Drug use: No  . Sexual activity: Yes   Other Topics Concern  . None   Social History Narrative  . None   Family History  Problem Relation Age of Onset  . Colon cancer Mother 68  . Diabetes Mother   . Asthma Father   . Breast cancer Neg Hx    Scheduled Meds:  Continuous Infusions:  PRN Meds:. Medications Prior to Admission:  Prior to Admission medications   Medication Sig Start Date End Date Taking? Authorizing Provider  acetaminophen (TYLENOL) 500 MG tablet Take 500 mg by mouth as needed for mild pain.   Yes  [provider]  apixaban (ELIQUIS) 5 MG TABS tablet Take 1 tablet (5 mg total) by mouth 2 (two) times daily. 02/04/17  Yes Wieting, Richard, MD  diltiazem (CARDIZEM CD) 240 MG 24 hr capsule Take 1 capsule (240 mg total) by mouth daily. Patient taking differently: Take 240 mg by mouth at bedtime.  02/05/17  Yes Loletha Grayer, MD  ferrous sulfate 325 (65 FE) MG tablet Take 1 tablet (325 mg total) by mouth daily with breakfast. 02/05/17  Yes Wieting, Richard, MD  meropenem 1 g in sodium chloride 0.9 % 100 mL Inject 1 g into the vein every 8 (eight) hours. 01/23/17  Yes Theodoro Grist, MD  metoprolol tartrate (LOPRESSOR) 50 MG tablet Take 50 mg by mouth daily.   Yes [provider]  potassium chloride SA (K-DUR,KLOR-CON) 20 MEQ tablet Take 1 tablet (20 mEq total) by mouth 2 (two) times daily. 02/27/17  Yes Cammie Sickle, MD  diphenhydrAMINE (BENADRYL) 25 mg capsule Take 1 capsule (25 mg total) by mouth every 6 (six) hours as needed (Rash). Patient not taking: Reported on 02/28/2017 01/23/17   Ether Griffins,  Rima, MD  ondansetron (ZOFRAN) 4 MG tablet Take 1 tablet (4 mg total) by mouth every 6 (six) hours as needed for nausea. Patient not taking: Reported on 02/28/2017 02/04/17   Loletha Grayer, MD  oxyCODONE-acetaminophen (ROXICET) 5-325 MG tablet Take 1 tablet by mouth every 6 (six) hours as needed. Patient not taking: Reported on 02/28/2017 02/04/17 02/04/18  Loletha Grayer, MD   Allergies  Allergen Reactions  . Cefepime Rash  . Penicillins Rash    As a child. Has patient had a PCN reaction causing immediate rash, facial/tongue/throat swelling, SOB or lightheadedness with hypotension: Unknown Has patient had a PCN reaction causing severe rash involving mucus membranes or skin necrosis: Unknown Has patient had a PCN reaction that required hospitalization: No Has patient had a PCN reaction occurring within the last 10 years: No If all of the above answers are "NO", then may proceed  with Cephalosporin use.    Review of Systems  Constitutional: Positive for fever.   Physical Exam  Constitutional: She is oriented to person, place, and time. She is cooperative.  HENT:  Head: Normocephalic and atraumatic.  Cardiovascular: Regular rhythm.   Pulmonary/Chest: Effort normal.  Abdominal: There is no tenderness.  Wound vac/colostomy  Neurological: She is alert and oriented to person, place, and time.  Skin: Skin is warm and dry. There is pallor.  Psychiatric: She has a normal mood and affect. Her speech is normal and behavior is normal. Cognition and memory are normal.  Nursing note and vitals reviewed.  Vital Signs: BP 112/69 (BP Location: Left Wrist)   Pulse (!) 115   Temp 98.8 F (37.1 C) (Oral)   Resp 20   Ht _0  (1.6 m)   Wt 57.3 kg (126 lb 6.4 oz)   LMP 03/01/2015 Comment: history of bilat ovary removal  SpO2 93%   BMI 22.39 kg/m  Pain Assessment: 0-10   Pain Score: 0-No pain  SpO2: SpO2: 93 % O2 Device:SpO2: 93 % O2 Flow Rate: .O2 Flow Rate (L/min): 2 L/min  IO: Intake/output summary:   Intake/Output Summary (Last 24 hours) at 03/05/17 0806 Last data filed at 03/04/17 1200  Gross per 24 hour  Intake              240 ml  Output                0 ml  Net              240 ml    LBM: Last BM Date: 03/03/17 Baseline Weight: Weight: 57.3 kg (126 lb 6.4 oz) Most recent weight: Weight: 57.3 kg (126 lb 6.4 oz)     Palliative Assessment/Data:  PPS 50%   Flowsheet Rows     Most Recent Value  Intake Tab  Referral Department  Hospitalist  Unit at Time of Referral  Oncology Unit  Palliative Care Primary Diagnosis  Cancer  Palliative Care Type  New Palliative care  Reason for referral  Clarify Goals of Care  Date first seen by Palliative Care  03/04/17  Clinical Assessment  Palliative Performance Scale Score  50%  Psychosocial & Spiritual Assessment  Palliative Care Outcomes  Patient/Family meeting held?  Yes  Who was at the meeting?  patient  and husband  Palliative Care Outcomes  Clarified goals of care, Provided end of life care assistance, Provided psychosocial or spiritual support, ACP counseling assistance, Completed durable DNR, Linked to palliative care logitudinal support, Provided advance care planning, Counseled regarding hospice  Time In: 0945 Time Out: 1100 Time Total: 50mn Greater than 50%  of this time was spent counseling and coordinating care related to the above assessment and plan.  Signed by:  MIhor Dow FNP-C Palliative Medicine Team  Phone: [phone]: [phone]  Please contact Palliative Medicine Team phone at [phone] questions and concerns.  For individual provider: See AShea Evans

## 2017-03-04 NOTE — Consult Note (Signed)
PHARMACY CONSULT NOTE FOR:  OUTPATIENT  PARENTERAL ANTIBIOTIC THERAPY (OPAT)  Indication: intra-abdominal abscess Regimen: vanc 750mg  q 12, meropenem 1g q 8hr End date: 03/18/17  IV antibiotic discharge orders are pended. To discharging provider:  please sign these orders via discharge navigator,  Select New Orders & click on the button choice - Manage This Unsigned Work.     Thank you for allowing pharmacy to be a part of this patient's care.  Ramond Dial 03/04/2017, 11:48 AM

## 2017-03-04 NOTE — Progress Notes (Signed)
Infectious Disease Long Term IV Antibiotic Orders  Diagnosis: Intraabdominal abscess  Culture results Lab Results  Component Value Date   WBC 58.8 (HH) 03/04/2017   HGB 9.2 (L) 03/04/2017   HCT 28.3 (L) 03/04/2017   MCV 83.0 03/04/2017   PLT 677 (H) 03/04/2017     Allergies:  Allergies  Allergen Reactions  . Cefepime Rash  . Penicillins Rash    As a child. Has patient had a PCN reaction causing immediate rash, facial/tongue/throat swelling, SOB or lightheadedness with hypotension: Unknown Has patient had a PCN reaction causing severe rash involving mucus membranes or skin necrosis: Unknown Has patient had a PCN reaction that required hospitalization: No Has patient had a PCN reaction occurring within the last 10 years: No If all of the above answers are "NO", then may proceed with Cephalosporin use.     Discharge antibiotics Vancomycin         750          mg  every          12     hours .     Goal vancomycin trough 15-20.    Pharmacy to adjust dosing based on levels Meropenem  1000 mg every  8 hours  Oral fluconazole 200 mg po qd  PICC Care per protocol Labs weekly while on IV antibiotics      CBC w diff   Comprehensive met panel Vancomycin Trough     Planned duration of antibiotics 2 weeks   Stop date 7/24  Follow up clinic date TBD  FAX weekly labs to [phone]  Leonel Ramsay, MD

## 2017-03-04 NOTE — Progress Notes (Signed)
CH received an OR to come complete a AD. CH went to room and helped PT finish filling out AD and got witnesses and notary to come and complete the AD process. 1 copy was put in PT's file and orignal was given to PT.    03/04/17 1115  Clinical Encounter Type  Visited With Patient and family together  Visit Type Follow-up  Referral From Nurse  Consult/Referral To Chaplain  Spiritual Encounters  Spiritual Needs Other (Comment)

## 2017-03-04 NOTE — Care Management (Signed)
Update from Dr. Leslye Peer. Ms. Schrade will be going home today. Will continue Meropenem IV in the home and Dr. Ola Spurr will be adding Vancomycin. Brenton Grills, [place] representative updated, Discharge to home today per Dr. Leslye Peer. Family will transport. Will be followed by Palliative Care in the home. Shelbie Ammons RN MSN CCM Care Management [phone]

## 2017-03-05 ENCOUNTER — Telehealth: Payer: Self-pay | Admitting: *Deleted

## 2017-03-05 ENCOUNTER — Ambulatory Visit (INDEPENDENT_AMBULATORY_CARE_PROVIDER_SITE_OTHER): Payer: Managed Care, Other (non HMO) | Admitting: Surgery

## 2017-03-05 ENCOUNTER — Other Ambulatory Visit: Payer: Self-pay | Admitting: *Deleted

## 2017-03-05 ENCOUNTER — Encounter: Payer: Self-pay | Admitting: Surgery

## 2017-03-05 VITALS — BP 120/83 | HR 104 | Temp 98.3°F | Ht 63.0 in | Wt 125.8 lb

## 2017-03-05 DIAGNOSIS — Z515 Encounter for palliative care: Secondary | ICD-10-CM

## 2017-03-05 DIAGNOSIS — Z7189 Other specified counseling: Secondary | ICD-10-CM

## 2017-03-05 DIAGNOSIS — Z09 Encounter for follow-up examination after completed treatment for conditions other than malignant neoplasm: Secondary | ICD-10-CM

## 2017-03-05 DIAGNOSIS — C799 Secondary malignant neoplasm of unspecified site: Secondary | ICD-10-CM

## 2017-03-05 NOTE — Telephone Encounter (Signed)
Left message on [place] VM to continue K+ bid and that O2 sat not a concern at this time. Re fevers, alternate Ibu and Tylenol q 6 - 8 hours per VO Dr Rogue Bussing

## 2017-03-05 NOTE — Progress Notes (Signed)
S/p hartmann's for perforation and abscesses Pt met disease Doing well surgical perspective Has a wound vac in place  PE NAD Wound vac removed, there were two pieces of foam, one was too large and was macerating healthy skin. Changed to a wet/dry. Colostomy pink and patent. No evidence of infection  A/p Doing well No surgical issues Wet/dry daily F/U prn

## 2017-03-05 NOTE — Patient Instructions (Signed)
If you need to call our office with any questions or concerns please feel free.

## 2017-03-05 NOTE — Telephone Encounter (Signed)
Patient discharged home yesterday and is on IV Abx, has concerns regarding O2 sats of 87 - 93% today with deep breathing to get it up. Has an appt with surgeon today, her Axiliary temp is 99.9. Also asking if she is to be taking potassium pills. Please advise

## 2017-03-06 ENCOUNTER — Telehealth: Payer: Self-pay | Admitting: *Deleted

## 2017-03-06 ENCOUNTER — Inpatient Hospital Stay: Payer: Managed Care, Other (non HMO)

## 2017-03-06 ENCOUNTER — Ambulatory Visit
Admission: RE | Admit: 2017-03-06 | Discharge: 2017-03-06 | Disposition: A | Discharge status: Home / Self Care | Payer: Managed Care, Other (non HMO) | Source: Ambulatory Visit | Attending: Internal Medicine | Admitting: Internal Medicine

## 2017-03-06 ENCOUNTER — Telehealth: Payer: Self-pay | Admitting: Pharmacist

## 2017-03-06 ENCOUNTER — Inpatient Hospital Stay (HOSPITAL_BASED_OUTPATIENT_CLINIC_OR_DEPARTMENT_OTHER): Payer: Managed Care, Other (non HMO) | Admitting: Internal Medicine

## 2017-03-06 VITALS — BP 115/82 | HR 123 | Temp 99.8°F | Resp 18 | Ht 63.0 in

## 2017-03-06 DIAGNOSIS — Z86010 Personal history of colonic polyps: Secondary | ICD-10-CM

## 2017-03-06 DIAGNOSIS — C7800 Secondary malignant neoplasm of unspecified lung: Secondary | ICD-10-CM

## 2017-03-06 DIAGNOSIS — Z79899 Other long term (current) drug therapy: Secondary | ICD-10-CM | POA: Diagnosis not present

## 2017-03-06 DIAGNOSIS — C787 Secondary malignant neoplasm of liver and intrahepatic bile duct: Secondary | ICD-10-CM

## 2017-03-06 DIAGNOSIS — N739 Female pelvic inflammatory disease, unspecified: Secondary | ICD-10-CM

## 2017-03-06 DIAGNOSIS — C801 Malignant (primary) neoplasm, unspecified: Secondary | ICD-10-CM

## 2017-03-06 DIAGNOSIS — D649 Anemia, unspecified: Secondary | ICD-10-CM | POA: Diagnosis not present

## 2017-03-06 DIAGNOSIS — Z7901 Long term (current) use of anticoagulants: Secondary | ICD-10-CM

## 2017-03-06 DIAGNOSIS — R112 Nausea with vomiting, unspecified: Secondary | ICD-10-CM

## 2017-03-06 DIAGNOSIS — C799 Secondary malignant neoplasm of unspecified site: Secondary | ICD-10-CM

## 2017-03-06 DIAGNOSIS — R5383 Other fatigue: Secondary | ICD-10-CM | POA: Diagnosis not present

## 2017-03-06 DIAGNOSIS — E876 Hypokalemia: Secondary | ICD-10-CM | POA: Diagnosis not present

## 2017-03-06 DIAGNOSIS — D72829 Elevated white blood cell count, unspecified: Secondary | ICD-10-CM | POA: Diagnosis not present

## 2017-03-06 DIAGNOSIS — I959 Hypotension, unspecified: Secondary | ICD-10-CM

## 2017-03-06 DIAGNOSIS — Z5112 Encounter for antineoplastic immunotherapy: Secondary | ICD-10-CM | POA: Diagnosis not present

## 2017-03-06 DIAGNOSIS — R0602 Shortness of breath: Secondary | ICD-10-CM | POA: Diagnosis not present

## 2017-03-06 DIAGNOSIS — R630 Anorexia: Secondary | ICD-10-CM | POA: Diagnosis not present

## 2017-03-06 DIAGNOSIS — Z800 Family history of malignant neoplasm of digestive organs: Secondary | ICD-10-CM

## 2017-03-06 DIAGNOSIS — K449 Diaphragmatic hernia without obstruction or gangrene: Secondary | ICD-10-CM

## 2017-03-06 DIAGNOSIS — M7989 Other specified soft tissue disorders: Secondary | ICD-10-CM | POA: Insufficient documentation

## 2017-03-06 DIAGNOSIS — J90 Pleural effusion, not elsewhere classified: Secondary | ICD-10-CM | POA: Diagnosis not present

## 2017-03-06 LAB — CBC WITH DIFFERENTIAL/PLATELET
Basophils Absolute: 0 10*3/uL (ref 0–0.1)
Basophils Relative: 0 %
EOS PCT: 0 %
Eosinophils Absolute: 0 10*3/uL (ref 0–0.7)
HCT: 27.8 % — ABNORMAL LOW (ref 35.0–47.0)
Hemoglobin: 9.2 g/dL — ABNORMAL LOW (ref 12.0–16.0)
Lymphocytes Relative: 1 %
Lymphs Abs: 0.7 10*3/uL — ABNORMAL LOW (ref 1.0–3.6)
MCH: 27.4 pg (ref 26.0–34.0)
MCHC: 33 g/dL (ref 32.0–36.0)
MCV: 82.9 fL (ref 80.0–100.0)
MONOS PCT: 2 %
Monocytes Absolute: 1.4 10*3/uL — ABNORMAL HIGH (ref 0.2–0.9)
Neutro Abs: 67.2 10*3/uL — ABNORMAL HIGH (ref 1.7–7.7)
Neutrophils Relative %: 97 %
Platelets: 763 10*3/uL — ABNORMAL HIGH (ref 150–440)
RBC: 3.35 MIL/uL — AB (ref 3.80–5.20)
RDW: 17.7 % — ABNORMAL HIGH (ref 11.5–14.5)
SMEAR REVIEW: INCREASED
WBC Morphology: INCREASED
WBC: 69.3 10*3/uL — AB (ref 3.6–11.0)

## 2017-03-06 LAB — BASIC METABOLIC PANEL
ANION GAP: 11 (ref 5–15)
BUN: 8 mg/dL (ref 6–20)
CHLORIDE: 96 mmol/L — AB (ref 101–111)
CO2: 25 mmol/L (ref 22–32)
Calcium: 7.9 mg/dL — ABNORMAL LOW (ref 8.9–10.3)
Creatinine, Ser: 0.67 mg/dL (ref 0.44–1.00)
GFR calc non Af Amer: >60 mL/min (ref >60)
Glucose, Bld: 99 mg/dL (ref 65–99)
POTASSIUM: 3.2 mmol/L — AB (ref 3.5–5.1)
SODIUM: 132 mmol/L — AB (ref 135–145)

## 2017-03-06 LAB — C-REACTIVE PROTEIN: CRP: 20.3 mg/dL — AB (ref <1.0)

## 2017-03-06 MED ORDER — OLAPARIB 150 MG PO TABS: 300.0000 mg | ORAL_TABLET | Freq: Two times a day (BID) | ORAL | 6 refills | Status: AC

## 2017-03-06 NOTE — Progress Notes (Signed)
[facility] OFFICE PROGRESS NOTE  Patient Care Team: McLean-Scocuzza, Nino Glow, MD as PCP - General (Internal Medicine) Clent Jacks, RN as Registered Nurse  Cancer Staging No matching staging information was found for the patient.   Oncology History   # MAY 2018- CARCINOMA of unknown primary [PDL-1 50%]   # 02/06/2017- KEYTRUDA   # [facility]- BRCA-2 positive**   # MAY 2018- PELVIC ABCESS/COLON PERF [Dr.Pabon; [facility]]; June 2018 PE on eliquis.      Carcinoma of unknown primary [facility])   INTERVAL HISTORY:  Anna [name] 51 y.o.  female pleasant patient above history of Carcinoma unknown primary- currently on palliative care on [place] the status post cycle #2 Approximately 1 week ago is here for follow-up.  Patient being discharged in the hospital for worsening leukocytosis; extensive workup for infection negative. Leukocytosis/fevers thought to be from worsening tumor/progression of disease. She continues to be on IV antibiotics.  She denies any pain. She admits to mild swelling of the right upper extremity [site of the PICC line].  Patient denies any diarrhea. Denies any worsening cough. Chronic shortness of breath. Fatigue.   REVIEW OF SYSTEMS: Positive for fatigue. Feels poorly.  A complete 10 point review of system is done which is negative except mentioned above/history of present illness.   PAST MEDICAL HISTORY :  Past Medical History:  Diagnosis Date  . Cancer ([place])   . Cancer with unknown primary site [facility])   . Colon polyp   . Diverticulosis   . Fibroid uterus   . Heme positive stool 08/01/2014  . HH (hiatus hernia)   . Large bowel perforation ([place]) 12/31/2016  . Metastatic adenocarcinoma ([place]) 12/20/2016  . Pelvic pain in female 01/13/2017  . Perforation of sigmoid colon ([place])     PAST SURGICAL HISTORY :   Past Surgical History:  Procedure Laterality Date  . COLONOSCOPY    . CYSTOSCOPY  12/20/2016   Procedure: CYSTOSCOPY;   Surgeon: Boykin Nearing, MD;  Location: [facility] ORS;  Service: Gynecology;;  . ESOPHAGOGASTRODUODENOSCOPY    . LAPAROSCOPIC BILATERAL SALPINGECTOMY Bilateral 12/20/2016   Procedure: LAPAROSCOPIC BILATERAL SALPINGOOPHERECTOMY,  Excision of anterior 5 cm calcified uterine fibroid;  Surgeon: Boykin Nearing, MD;  Location: [facility] ORS;  Service: Gynecology;  Laterality: Bilateral;  . LAPAROSCOPIC LYSIS OF ADHESIONS  12/20/2016   Procedure: LAPAROSCOPIC LYSIS OF ADHESIONS WITH DISSECTION OF LEFT TUBE AND OVARY.;  Surgeon: Olean Ree, MD;  Location: [facility] ORS;  Service: General;;  . LAPAROTOMY N/A 12/31/2016   Procedure: EXPLORATORY LAPAROTOMY, sigmoid colectomy, removal of omentum, colostomy;  Surgeon: Jules Husbands, MD;  Location: [facility] ORS;  Service: General;  Laterality: N/A;  . SIGMOIDOSCOPY  12/20/2016   Procedure: RIGID SIGMOIDOSCOPY;  Surgeon: Olean Ree, MD;  Location: [facility] ORS;  Service: General;;  . TONSILLECTOMY      FAMILY HISTORY :   Family History  Problem Relation Age of Onset  . Colon cancer Mother 63  . Diabetes Mother   . Asthma Father   . Breast cancer Neg Hx     SOCIAL HISTORY:   Social History  Substance Use Topics  . Smoking status: Never Smoker  . Smokeless tobacco: Never Used  . Alcohol use No    ALLERGIES:  is allergic to cefepime and penicillins.  MEDICATIONS:  Current Outpatient Prescriptions  Medication Sig Dispense Refill  . acetaminophen (TYLENOL) 500 MG tablet Take 500 mg by mouth as needed for mild pain.    Marland Kitchen apixaban (ELIQUIS)  5 MG TABS tablet Take 1 tablet (5 mg total) by mouth 2 (two) times daily. 60 tablet 0  . diltiazem (CARDIZEM CD) 240 MG 24 hr capsule Take 1 capsule (240 mg total) by mouth at bedtime.    . feeding supplement, ENSURE ENLIVE, (ENSURE ENLIVE) LIQD Take 237 mLs by mouth 2 (two) times daily between meals. 60 Bottle 0  . ferrous sulfate 325 (65 FE) MG tablet Take 1 tablet (325 mg total) by mouth daily with breakfast. 30 tablet  0  . fluconazole (DIFLUCAN) 200 MG tablet Take 1 tablet (200 mg total) by mouth daily at 6 PM. 14 tablet 0  . meropenem (MERREM) IVPB Inject 1 g into the vein every 8 (eight) hours. Indication:  Intra abdominal abscess Last Day of Therapy:  7/24 Labs - Once weekly:  CBC/D and BMP, Labs - Every other week:  ESR and CRP 42 Units 0  . vancomycin IVPB Inject 750 mg into the vein every 12 (twelve) hours. Indication:  Intra abdominal abscess Last Day of Therapy:  03/18/17 Labs - Sunday/Monday:  CBC/D, BMP, and vancomycin trough. Labs - Thursday:  BMP and vancomycin trough Labs - Every other week:  ESR and CRP 28 Units 0  . docusate sodium (COLACE) 100 MG capsule Take 1 capsule (100 mg total) by mouth daily as needed. (Patient not taking: Reported on 03/06/2017) 30 capsule 0  . olaparib (LYNPARZA) 150 MG tablet Take 2 tablets (300 mg total) by mouth 2 (two) times daily. Swallow whole. 120 tablet 6  . ondansetron (ZOFRAN) 4 MG tablet Take 1 tablet by mouth daily as needed.    . potassium chloride SA (K-DUR,KLOR-CON) 20 MEQ tablet Take 1 tablet (20 mEq total) by mouth 2 (two) times daily. (Patient not taking: Reported on 03/06/2017) 14 tablet 0   No current facility-administered medications for this visit.     PHYSICAL EXAMINATION: ECOG PERFORMANCE STATUS: 1 - Symptomatic but completely ambulatory  BP 115/82 (BP Location: Left Wrist, Patient Position: Sitting)   Pulse (!) 123   Temp 99.8 F (37.7 C) (Tympanic)   Resp 18   Ht 5' 3"  (1.6 m)   LMP 03/01/2015 Comment: history of bilat ovary removal  BMI 22.28 kg/m   There were no vitals filed for this visit.  GENERAL: moderately nourished well-developed; Alert, no distress and comfortable.   With her husband.  EYES: no pallor or icterus OROPHARYNX: no thrush or ulceration; good dentition  NECK: supple, no masses felt LYMPH:  no palpable lymphadenopathy in the cervical, axillary or inguinal regions LUNGS: clear to auscultation and  No wheeze or  crackles HEART/CVS: regular rate & rhythm and no murmurs; No lower extremity edema ABDOMEN:abdomen soft, non-tender and normal bowel sounds. Wound VAC is out. Musculoskeletal:no cyanosis of digits and no clubbing. Mild left upper extremity swelling/PICC line in place nontender PSYCH: alert & oriented x 3 with fluent speech NEURO: no focal motor/sensory deficits SKIN:  no rashes or significant lesions  LABORATORY DATA:  I have reviewed the data as listed    Component Value Date/Time   NA 132 (L) 03/06/2017 0914   K 3.2 (L) 03/06/2017 0914   CL 96 (L) 03/06/2017 0914   CO2 25 03/06/2017 0914   GLUCOSE 99 03/06/2017 0914   BUN 8 03/06/2017 0914   CREATININE 0.67 03/06/2017 0914   CALCIUM 7.9 (L) 03/06/2017 0914   PROT 5.1 (L) 02/28/2017 1825   ALBUMIN 1.8 (L) 02/28/2017 1825   AST 39 02/28/2017 1825   ALT  29 02/28/2017 1825   ALKPHOS 143 (H) 02/28/2017 1825   BILITOT 0.5 02/28/2017 1825   GFRNONAA >60 03/06/2017 0914   GFRAA >60 03/06/2017 0914    No results found for: SPEP, UPEP  Lab Results  Component Value Date   WBC 69.3 (HH) 03/06/2017   NEUTROABS 67.2 (H) 03/06/2017   HGB 9.2 (L) 03/06/2017   HCT 27.8 (L) 03/06/2017   MCV 82.9 03/06/2017   PLT 763 (H) 03/06/2017      Chemistry      Component Value Date/Time   NA 132 (L) 03/06/2017 0914   K 3.2 (L) 03/06/2017 0914   CL 96 (L) 03/06/2017 0914   CO2 25 03/06/2017 0914   BUN 8 03/06/2017 0914   CREATININE 0.67 03/06/2017 0914      Component Value Date/Time   CALCIUM 7.9 (L) 03/06/2017 0914   ALKPHOS 143 (H) 02/28/2017 1825   AST 39 02/28/2017 1825   ALT 29 02/28/2017 1825   BILITOT 0.5 02/28/2017 1825       RADIOGRAPHIC STUDIES: I have personally reviewed the radiological images as listed and agreed with the findings in the report. US Venous Img Upper Uni Left  Result Date: 03/06/2017 CLINICAL DATA:  Left upper extremity swelling. EXAM: LEFT UPPER EXTREMITY VENOUS DOPPLER ULTRASOUND TECHNIQUE:  Gray-scale sonography with graded compression, as well as color Doppler and duplex ultrasound were performed to evaluate the upper extremity deep venous system from the level of the subclavian vein and including the jugular, axillary, basilic, radial, ulnar and upper cephalic vein. Spectral Doppler was utilized to evaluate flow at rest and with distal augmentation maneuvers. COMPARISON:  None. FINDINGS: Contralateral Subclavian Vein: Thrombus identified, better characterized on the dedicated right upper extremity ultrasound of 02/27/2017. Internal Jugular Vein: No evidence of thrombus. Normal compressibility, respiratory phasicity and response to augmentation. Subclavian Vein: No evidence of thrombus. Normal compressibility, respiratory phasicity and response to augmentation. Axillary Vein: No evidence of thrombus. Normal compressibility, respiratory phasicity and response to augmentation. Cephalic Vein: No evidence of thrombus. Normal compressibility, respiratory phasicity and response to augmentation. Basilic Vein: No evidence of thrombus. Normal compressibility, respiratory phasicity and response to augmentation. Brachial Veins: No evidence of thrombus. Normal compressibility, respiratory phasicity and response to augmentation. Radial Veins: No evidence of thrombus. Normal compressibility, respiratory phasicity and response to augmentation. Ulnar Veins: No evidence of thrombus. Normal compressibility, respiratory phasicity and response to augmentation. Other Findings: PICC line catheter identified in the left basilic, axillary, and subclavian veins. IMPRESSION: 1. No evidence of DVT within the left upper extremity. 2. Known right subclavian vein thrombus as seen on previous ultrasound of 02/27/2017. Electronically Signed   By: Misty Stanley M.D.   On: 03/06/2017 12:32     ASSESSMENT & PLAN:  Carcinoma of unknown primary ([place]) # CUP- PDL-1 50%. Tolerated fairly well. S/p cycle #2 1 week ago. However, July 5th  CT C/A/P- show progressive disease in the liver; lungs; small bilateral pleural effusion/baseline consolidation; soft tissue mass in the pelvis [facility] discussion below]  # CT scan to early to assess for response- recommend addition of lynparza [as pt is BRCA-2 positive]; discussed the potential side effects. Nausea vomiting and anemia. We'll start 300 mg twice a day.  # Fevers leukocytosis of 63,000/no clear etiology of the infectious source noted. Likely tumor related. Continue current antibiotics.  # Left upper extremity swelling- PICC line- stat ultrasound. 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  # malnutition- recommend increased protein intake. Referral  to [facility]; nutrition  # Patient needs a letter for her employment stating-her disease is terminal; she would not be able to go back to work for at least 6 months from now. They'll call us with the fax number.  # # Proceed with palliative care consultation at home. Overall prognosis is poor.   # Given BRCA2 positivity-in the tumor will check for germ line mutation at next visit.   # follow up in 2 weeks/keytruda/ labs. Discussed with Dr. Perrin Maltese.   Addendum:   Ultrasound of the left upper extremity- negative for DVT.    Orders Placed This Encounter  Procedures  . US Venous Img Upper Uni Left    Standing Status:   Future    Number of Occurrences:   1    Standing Expiration Date:   05/06/2018    Order Specific Question:   Reason for Exam (SYMPTOM  OR DIAGNOSIS REQUIRED)    Answer:   left upper extrmity swelling    Order Specific Question:   Preferred imaging location?    Answer:   [facility]    Order Specific Question:   Call Results- Best Contact Number?    Answer:   [phone], hold pt  . CBC with Differential    Standing Status:   Future    Standing Expiration Date:   03/06/2018  . Comprehensive metabolic panel    Standing Status:   Future    Standing Expiration Date:   03/06/2018  . Amb  Referral to Nutrition and Diabetic E    Referral Priority:   Routine    Referral Type:   Consultation    Referral Reason:   Specialty Services Required    Number of Visits Requested:   1  . Hold Tube- Blood Bank    Standing Status:   Future    Standing Expiration Date:   03/06/2018   All questions were answered. The patient knows to call the clinic with any problems, questions or concerns.      Cammie Sickle, MD 03/06/2017 5:14 PM

## 2017-03-06 NOTE — Telephone Encounter (Signed)
Received free drug(Keytruda) for patient from [employer].

## 2017-03-06 NOTE — Patient Instructions (Signed)
Olaparib oral capsules What is this medicine? OLAPARIB (oh LA pa rib) is a chemotherapy drug. It targets specific enzymes within cancer cells and stops the cancer cell from growing. This medicine is used to treat ovarian cancer. This medicine may be used for other purposes; ask your health care provider or pharmacist if you have questions. COMMON BRAND NAME(S): Lynparza What should I tell my health care provider before I take this medicine? They need to know if you have any of these conditions: -anemia -kidney disease -liver disease -lung disease -low blood counts, like low white cell, platelet, or red cell counts -an unusual or allergic reaction to olaparib, other medicines, foods, dyes, or preservatives -pregnant or trying to get pregnant -breast-feeding How should I use this medicine? Take this medicine by mouth with a glass of water. Follow the directions on the prescription label. Do not cut, crush, or chew this medicine. You can take it with or without food. If it upsets your stomach, take it with food. However, avoid grapefruit juice, grapefruit or [place] oranges while on this medicine. Take your medicine at regular intervals. Do not take it more often than directed. Do not stop taking except on your doctor's advice. A special MedGuide will be given to you by the pharmacist with each prescription and refill. Be sure to read this information carefully each time. Talk to your pediatrician regarding the use of this medicine in children. Special care may be needed. Overdosage: If you think you have taken too much of this medicine contact a poison control center or emergency room at once. NOTE: This medicine is only for you. Do not share this medicine with others. What if I miss a dose? If you miss a dose, take it as soon as you can. If it is almost time for your next dose, take only that dose. Do not take double or extra doses. What may interact with this medicine? -antiviral medicines  for hepatitis, HIV or AIDS -aprepitant -boceprevir -bosentan -carbamazepine -certain medicines for fungal infections like fluconazole, ketoconazole, itraconazole, posaconazole, and voriconazole -certain medicines for infections, such as ciprofloxacin, clarithromycin, erythromycin, telithromycin -crizotinib -diltiazem -grapefruit juice -imatinib -modafinil -nafcillin -nefazodone -phenobarbital -phenytoin -rifampin -[place] oranges -St. John's Wort -telaprevir -verapamil This list may not describe all possible interactions. Give your health care provider a list of all the medicines, herbs, non-prescription drugs, or dietary supplements you use. Also tell them if you smoke, drink alcohol, or use illegal drugs. Some items may interact with your medicine. What should I watch for while using this medicine? This drug may make you feel generally unwell. This is not uncommon, as chemotherapy can affect healthy cells as well as cancer cells. Report any side effects. Continue your course of treatment even though you feel ill unless your doctor tells you to stop. You will need blood work done while you are taking this medicine. This medicine may increase your risk to bruise or bleed. Call your doctor or health care professional if you notice any unusual bleeding. Call your doctor or health care professional for advice if you get a fever, chills or sore throat, or other symptoms of a cold or flu. Do not treat yourself. This drug decreases your body's ability to fight infections. Try to avoid being around people who are sick. If you are going to have surgery or any other procedures, tell your doctor you are taking this medicine. Do not become pregnant while taking this medicine or for 6 months after the last dose. Women should   inform their doctor if they wish to become pregnant or think they might be pregnant. Men should not father a child while taking this medicine and for 3 months after stopping it.  Do not donate sperm while taking this medicine and for 3 months after you stop taking this medicine. There is a potential for serious side effects to an unborn child. Talk to your health care professional or pharmacist for more information. Women who are able to become pregnant should use effective birth control during treatment and for at least 6 months after receiving the last dose. Talk to your healthcare provider about birth control methods that may be right for you. Do not breast-feed an infant while taking this medicine or for 1 month after the last dose. What side effects may I notice from receiving this medicine? Side effects that you should report to your doctor or health care professional as soon as possible: -allergic reactions like skin rash, itching or hives, swelling of the face, lips, or tongue -breathing problems, like shortness of breath, cough, or wheezing -fever -low blood counts - this medicine may decrease the number of white blood cells, red blood cells and platelets. You may be at increased risk for infections and bleeding. -signs and symptoms of bleeding such as bloody or black, tarry stools; red or dark-brown urine; spitting up blood or brown material that looks like coffee grounds; red spots on the skin; unusual bruising or bleeding from the eye, gums, or nose -signs and symptoms of a blood clot such as changes in vision; chest pain with breathing problems; severe, sudden headache; pain, swelling, warmth in the leg; trouble speaking; sudden numbness or weakness of the face, arm or leg -signs and symptoms of low magnesium like muscle cramps or muscle pain; tingling or tremors; muscle weakness; seizures; or fast, irregular heartbeat -signs and symptoms of infection like fever or chills; cough; sore throat; pain or trouble passing urine -weak or tired Side effects that usually do not require medical attention (report to your doctor or health care professional if they continue or  are bothersome): -changes in taste -diarrhea -headache -heartburn, indigestion -loss of appetite -muscle or joint pain -nausea/vomiting -runny nose -stomach pain -weight loss This list may not describe all possible side effects. Call your doctor for medical advice about side effects. You may report side effects to FDA at 1-800-FDA-1088. Where should I keep my medicine? Keep out of the reach of children. Store between 20 and 25 degrees C (68 and 77 degrees F). Do not store at temperatures greater than 40 degrees C (104 degrees F) and do not take this medicine if you think it may have been stored at a temperature greater than 104 degrees F. Throw away any unused medicine after the expiration date. NOTE: This sheet is a summary. It may not cover all possible information. If you have questions about this medicine, talk to your doctor, pharmacist, or health care provider.  2018 Elsevier/Gold Standard (2016-09-10 12:45:04)  

## 2017-03-06 NOTE — Progress Notes (Signed)
Patient here for hp f/u. Pt c/o edema in left arm. picc line present in left arm. Patient stated that she missed her am dose of IV vanc. The IV tubing did not want to "work." pt reports defect in IV tubing-would not infuse. Iv fluids-"leaking out of iv connection site". Husband tossed iv vac in trash at home and will let home health nurse that they will be short 1 bag of IV vanc. Per pt, she will resume her pm dose as scheduled.  Pt provided with written information on LYNPARZA. Patient instructed that medication will be ordered via mail order pharmacy; she understands that she will have a formal education session with RN to teach her about this medication.

## 2017-03-06 NOTE — Assessment & Plan Note (Addendum)
#  CUP- PDL-1 50%. Tolerated fairly well. S/p cycle #2 1 week ago. However, July 5th CT C/A/P- show progressive disease in the liver; lungs; small bilateral pleural effusion/baseline consolidation; soft tissue mass in the pelvis [C discussion below]  # CT scan to early to assess for response- recommend addition of lynparza [as pt is BRCA-2 positive]; discussed the potential side effects. Nausea vomiting and anemia. We'll start 300 mg twice a day.  # Fevers leukocytosis of 63,000/no clear etiology of the infectious source noted. Likely tumor related. Continue current antibiotics.  # Left upper extremity swelling- PICC line- stat ultrasound. 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  # malnutition- recommend increased protein intake. Referral to Julie; nutrition  # Patient needs a letter for her employment stating-her disease is terminal; she would not be able to go back to work for at least 6 months from now. They'll call us with the fax number.  # # Proceed with palliative care consultation at home. Overall prognosis is poor.   # Given BRCA2 positivity-in the tumor will check for germ line mutation at next visit.   # follow up in 2 weeks/keytruda/ labs. Discussed with Dr. Pabone.   Addendum:   Ultrasound of the left upper extremity- negative for DVT.  

## 2017-03-06 NOTE — Telephone Encounter (Signed)
rcvd incoming call from u/s at 115pm. Doppler left upper ext. Negative for dvt. Dr. Rogue Bussing made aware of results at 1400

## 2017-03-07 ENCOUNTER — Telehealth: Payer: Self-pay | Admitting: *Deleted

## 2017-03-07 ENCOUNTER — Encounter: Payer: Self-pay | Admitting: *Deleted

## 2017-03-07 NOTE — Telephone Encounter (Addendum)
Pt and pt's husband called x 3.  Husband called RN wanted to know why the letter has not been sent to pt's HR. Also wanted to know why she was not called yesterday regarding her u/s results. I returned phone call and informed the patient her husband that the letter was faxed this afternoon to [phone] [employer] per patient request. I explained to patient that the u/s tech informed me yesterday that the patient was aware the the u/s was negative for a clot.  The husband stated that they were promised a return phone call from [place] to provide that information even though they knew that she didn't have a clot.  Active listening provided to pt and pt's wife. Pt reassured that her u/s was negative and that the letter was faxed to her employer per her request.

## 2017-03-10 ENCOUNTER — Telehealth: Payer: Self-pay | Admitting: *Deleted

## 2017-03-10 ENCOUNTER — Telehealth: Payer: Self-pay

## 2017-03-10 NOTE — Telephone Encounter (Signed)
[employer]  - per insurance will not accept third party prior auth for lynparza. Clinical team will need to initiate this tomorrow.

## 2017-03-10 NOTE — Telephone Encounter (Signed)
Cathie Beams is calling from [facility] to receive an order on this patient. She would like to recert, have a caregiver to change her dry dressing daily, and discontinue her wound vac. I went ahead and gave her the verbal order to do that

## 2017-03-11 NOTE — Telephone Encounter (Signed)
Prior auth submitted to [place] via covermymeds for pt Anna [name] (Key: [id number]) -mrn:  [id number] Pending insurance review.  Dr. Rogue Bussing made aware that prior auth was needed.

## 2017-03-13 ENCOUNTER — Inpatient Hospital Stay: Payer: Managed Care, Other (non HMO)

## 2017-03-13 NOTE — Progress Notes (Signed)
Nutrition  Patient was a no show for nutrition appointment today.    Kaityln Kallstrom B. Janasha Barkalow, RD, LDN Registered Dietitian [phone] (pager)   

## 2017-03-14 NOTE — Telephone Encounter (Signed)
Contacted [employer]-spoke with Danae Chen. Prior auth approved. Will run it back through to process claim.

## 2017-03-14 NOTE — Telephone Encounter (Signed)
  [facility] (Key: [id number]) - [id number] Lonie Peak 150MG  OR TABS Status: PA Response - Approved

## 2017-03-17 ENCOUNTER — Telehealth: Payer: Self-pay | Admitting: Internal Medicine

## 2017-03-17 ENCOUNTER — Telehealth: Payer: Self-pay | Admitting: *Deleted

## 2017-03-17 NOTE — Telephone Encounter (Signed)
Hassan Rowan Ask pt to stop taking diltiazem[ given interaction with lynparza]; pt can start medictaion as recommended when available; will follow up as planned this week.

## 2017-03-17 NOTE — Telephone Encounter (Signed)
See original note

## 2017-03-17 NOTE — Telephone Encounter (Signed)
Pharmacy informed that we will change the Diltiazem and to go ahead and ship Lynparza as planned.  I then called patient and spoke with husband and advised that there is an interaction between meds and she is to stop the Diltiazem. He repeated this back to me

## 2017-03-17 NOTE — Telephone Encounter (Signed)
[employer] called to report that there is an interaction with Lynparza and Diltiazem that can increase the amount of Lynparza in the blood stream. Recommendation is to change the dose from 300 mg BID to 150 mg BID. For questions, please call Ashleigh [phone] ext [phone]

## 2017-03-17 NOTE — Telephone Encounter (Signed)
See md phone note back to [facility] encounter

## 2017-03-18 ENCOUNTER — Inpatient Hospital Stay: Payer: Managed Care, Other (non HMO)

## 2017-03-19 ENCOUNTER — Telehealth: Payer: Self-pay | Admitting: *Deleted

## 2017-03-19 NOTE — Telephone Encounter (Signed)
Patient received lynparza in mail and would like to know when she should start medication.  Per md- he will discuss tomorrow at md visit.

## 2017-03-20 ENCOUNTER — Other Ambulatory Visit: Payer: Self-pay | Admitting: *Deleted

## 2017-03-20 ENCOUNTER — Ambulatory Visit
Admission: RE | Admit: 2017-03-20 | Discharge: 2017-03-20 | Disposition: A | Discharge status: Home / Self Care | Payer: Managed Care, Other (non HMO) | Source: Ambulatory Visit | Attending: Internal Medicine | Admitting: Internal Medicine

## 2017-03-20 ENCOUNTER — Inpatient Hospital Stay: Payer: Managed Care, Other (non HMO)

## 2017-03-20 ENCOUNTER — Inpatient Hospital Stay (HOSPITAL_BASED_OUTPATIENT_CLINIC_OR_DEPARTMENT_OTHER): Payer: Managed Care, Other (non HMO) | Admitting: Internal Medicine

## 2017-03-20 ENCOUNTER — Telehealth: Payer: Self-pay | Admitting: *Deleted

## 2017-03-20 VITALS — BP 96/65 | HR 130 | Temp 97.6°F | Resp 16

## 2017-03-20 DIAGNOSIS — R5383 Other fatigue: Secondary | ICD-10-CM

## 2017-03-20 DIAGNOSIS — J90 Pleural effusion, not elsewhere classified: Secondary | ICD-10-CM

## 2017-03-20 DIAGNOSIS — D649 Anemia, unspecified: Secondary | ICD-10-CM

## 2017-03-20 DIAGNOSIS — C787 Secondary malignant neoplasm of liver and intrahepatic bile duct: Secondary | ICD-10-CM

## 2017-03-20 DIAGNOSIS — K449 Diaphragmatic hernia without obstruction or gangrene: Secondary | ICD-10-CM

## 2017-03-20 DIAGNOSIS — Z7901 Long term (current) use of anticoagulants: Secondary | ICD-10-CM

## 2017-03-20 DIAGNOSIS — R630 Anorexia: Secondary | ICD-10-CM

## 2017-03-20 DIAGNOSIS — M7989 Other specified soft tissue disorders: Secondary | ICD-10-CM

## 2017-03-20 DIAGNOSIS — D72829 Elevated white blood cell count, unspecified: Secondary | ICD-10-CM

## 2017-03-20 DIAGNOSIS — Z959 Presence of cardiac and vascular implant and graft, unspecified: Secondary | ICD-10-CM | POA: Diagnosis not present

## 2017-03-20 DIAGNOSIS — N739 Female pelvic inflammatory disease, unspecified: Secondary | ICD-10-CM | POA: Diagnosis not present

## 2017-03-20 DIAGNOSIS — C801 Malignant (primary) neoplasm, unspecified: Secondary | ICD-10-CM

## 2017-03-20 DIAGNOSIS — C7800 Secondary malignant neoplasm of unspecified lung: Secondary | ICD-10-CM

## 2017-03-20 DIAGNOSIS — T82524A Displacement of infusion catheter, initial encounter: Secondary | ICD-10-CM

## 2017-03-20 DIAGNOSIS — I959 Hypotension, unspecified: Secondary | ICD-10-CM

## 2017-03-20 DIAGNOSIS — E876 Hypokalemia: Secondary | ICD-10-CM

## 2017-03-20 DIAGNOSIS — Z800 Family history of malignant neoplasm of digestive organs: Secondary | ICD-10-CM

## 2017-03-20 DIAGNOSIS — R112 Nausea with vomiting, unspecified: Secondary | ICD-10-CM

## 2017-03-20 DIAGNOSIS — Z86010 Personal history of colonic polyps: Secondary | ICD-10-CM

## 2017-03-20 DIAGNOSIS — Z79899 Other long term (current) drug therapy: Secondary | ICD-10-CM

## 2017-03-20 DIAGNOSIS — R0602 Shortness of breath: Secondary | ICD-10-CM | POA: Diagnosis not present

## 2017-03-20 DIAGNOSIS — T82528A Displacement of other cardiac and vascular devices and implants, initial encounter: Principal | ICD-10-CM

## 2017-03-20 MED ORDER — SODIUM CHLORIDE 0.9% FLUSH
10.0000 mL | Freq: Once | INTRAVENOUS | Status: AC
  Administered 2017-03-20: 10 mL
  Filled 2017-03-20: qty 10

## 2017-03-20 MED ORDER — CARVEDILOL 6.25 MG PO TABS: 6.2500 mg | ORAL_TABLET | Freq: Two times a day (BID) | ORAL | 3 refills | Status: AC

## 2017-03-20 MED ORDER — SODIUM CHLORIDE 0.9 % IV SOLN
Freq: Once | INTRAVENOUS | Status: AC
  Administered 2017-03-20: 10:00:00 via INTRAVENOUS
  Filled 2017-03-20: qty 1000

## 2017-03-20 MED ORDER — HEPARIN SOD (PORK) LOCK FLUSH 100 UNIT/ML IV SOLN: 250.0000 [IU] | Freq: Once | INTRAVENOUS | Status: DC | PRN

## 2017-03-20 NOTE — Assessment & Plan Note (Addendum)
#  CUP- PDL-1 50%. Tolerated fairly well. S/p cycle #2 appx 3 week ago. However, July 5th CT C/A/P- show progressive disease in the liver; lungs; small bilateral pleural effusion/baseline consolidation; soft tissue mass in the pelvis [facility] discussion below].  # I had a long discussion the patient and her husband regarding the concerns for progressive disease/worsening malignancy causing the multitude of symptoms. After lengthy discussion- patient/ husband agreed to proceed with Keytruda cycle #3 next week- once PICC line is replaced. Also recommend starting Lynparza at 300 mg once a day dose-discussed the potential side effects.  # Central line/PICC line- malfunction;  recommend replacement.  #  Shortness of breath/cough -Tachycardia/hypotension- likely secondary to third space/ intravascular depletion. Recommend stat chest x-ray.  # Follow up again next week/symptom management.  # 40 minutes face-to-face with the patient discussing the above plan of care; more than 50% of time spent on prognosis/ natural history; counseling and coordination.

## 2017-03-20 NOTE — Progress Notes (Signed)
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 Past medical history of diverticulosis, lysis of adhesions with dissection of left tube and ovary, large bowel perforation with sigmoid colectomy, removal of omentum and colostomy in 12/2016.  Patient reports no appetite and feels full quickly.  No report of nausea, has colostomy but does not report issues.  Typically eats yogurt for breakfast, ramen noodles for lunch and meat and vegetables for dinner.  Has stopped drinking premier protein and ensure enlive shakes but has some at home.    Nutrition Focused Physical Exam: deferred  Medications: colace, diflucan, zofran, potassium  Labs: Na 132, K 3.2  Anthropometrics:   Height: 63 inches Weight: 125 lb on 7/11 UBW:  Noted 140 lb 1 year ago BMI: 22  Noted recent weight gain from 116 lb in 01/2017 to 125 lb 11% weight loss in the last year.   Estimated Energy Needs  Kcals: 7124-5809 calories/d Protein: 68-86 g/d Fluid: 1.9 L/d  NUTRITION DIAGNOSIS: Inadequate food and beverage intake related to feeling of fullness quickly, cancer and bowel surgery in May 2018 as evidenced by 11% weight loss in the last year   MALNUTRITION DIAGNOSIS: continue to monitor   INTERVENTION:   Encouraged small frequent meals of high calorie, high protein foods.   Encouraged intake of premier protein/ensure enlive shakes for added nutrition. Samples and coupons given today. Discussed medical nutrition therapy with new colostomy.  Handout from the Academy of Nutrition and Dietetics given.    MONITORING, EVALUATION, GOAL: Patient will consume adequate calories and protein to meet nutritional needs   NEXT VISIT:  As needed  Yesli Vanderhoff B. Zenia Resides, [place], [place] Registered Dietitian [phone] (pager)

## 2017-03-20 NOTE — Progress Notes (Signed)
[place] OFFICE PROGRESS NOTE  Patient Care Team: McLean-Scocuzza, Nino Glow, MD as PCP - General (Internal Medicine) Clent Jacks, RN as Registered Nurse  Cancer Staging No matching staging information was found for the patient.   Oncology History   # MAY 2018- CARCINOMA of unknown primary [PDL-1 50%]   # 02/06/2017- KEYTRUDA   # [facility]- BRCA-2 positive**   # MAY 2018- PELVIC ABCESS/COLON PERF [Dr.Pabon; [place]]; June 2018 PE on eliquis.      Carcinoma of unknown primary [facility])   INTERVAL HISTORY:  Anna [name] 51 y.o.  female pleasant patient above history of Carcinoma unknown primary- currently on palliative care on [place] the status post cycle #2 Approximately 3 weeks ago is here for follow-up.  In the interim patient was evaluated by palliative care at home.   Patient continues to be on IV antibiotics/PICC line. However today noted to have difficulty with blood return through the PICC line. Overall patient continues to feel poorly. She complains of worsening shortness of breath/cough. Poor appetite. Swelling in the legs. Denies any significant pain. Patient denies any diarrhea.  Worsening fatigue   REVIEW OF SYSTEMS:  A complete 10 point review of system is done which is negative except mentioned above/history of present illness.   PAST MEDICAL HISTORY :  Past Medical History:  Diagnosis Date  . Cancer ([place])   . Cancer with unknown primary site [facility])   . Colon polyp   . Diverticulosis   . Fibroid uterus   . Heme positive stool 08/01/2014  . HH (hiatus hernia)   . Large bowel perforation ([place]) 12/31/2016  . Metastatic adenocarcinoma ([place]) 12/20/2016  . Pelvic pain in female 01/13/2017  . Perforation of sigmoid colon ([place])     PAST SURGICAL HISTORY :   Past Surgical History:  Procedure Laterality Date  . COLONOSCOPY    . CYSTOSCOPY  12/20/2016   Procedure: CYSTOSCOPY;  Surgeon: Boykin Nearing, MD;  Location: [facility]  ORS;  Service: Gynecology;;  . ESOPHAGOGASTRODUODENOSCOPY    . LAPAROSCOPIC BILATERAL SALPINGECTOMY Bilateral 12/20/2016   Procedure: LAPAROSCOPIC BILATERAL SALPINGOOPHERECTOMY,  Excision of anterior 5 cm calcified uterine fibroid;  Surgeon: Boykin Nearing, MD;  Location: [facility] ORS;  Service: Gynecology;  Laterality: Bilateral;  . LAPAROSCOPIC LYSIS OF ADHESIONS  12/20/2016   Procedure: LAPAROSCOPIC LYSIS OF ADHESIONS WITH DISSECTION OF LEFT TUBE AND OVARY.;  Surgeon: Olean Ree, MD;  Location: [facility] ORS;  Service: General;;  . LAPAROTOMY N/A 12/31/2016   Procedure: EXPLORATORY LAPAROTOMY, sigmoid colectomy, removal of omentum, colostomy;  Surgeon: Jules Husbands, MD;  Location: [facility] ORS;  Service: General;  Laterality: N/A;  . SIGMOIDOSCOPY  12/20/2016   Procedure: RIGID SIGMOIDOSCOPY;  Surgeon: Olean Ree, MD;  Location: [facility] ORS;  Service: General;;  . TONSILLECTOMY      FAMILY HISTORY :   Family History  Problem Relation Age of Onset  . Colon cancer Mother 33  . Diabetes Mother   . Asthma Father   . Breast cancer Neg Hx     SOCIAL HISTORY:   Social History  Substance Use Topics  . Smoking status: Never Smoker  . Smokeless tobacco: Never Used  . Alcohol use No    ALLERGIES:  is allergic to cefepime and penicillins.  MEDICATIONS:  Current Outpatient Prescriptions  Medication Sig Dispense Refill  . acetaminophen (TYLENOL) 500 MG tablet Take 500 mg by mouth as needed for mild pain.    Marland Kitchen apixaban (ELIQUIS) 5 MG TABS tablet Take  1 tablet (5 mg total) by mouth 2 (two) times daily. 60 tablet 0  . diltiazem (CARDIZEM CD) 240 MG 24 hr capsule Take 1 capsule (240 mg total) by mouth at bedtime.    . docusate sodium (COLACE) 100 MG capsule Take 1 capsule (100 mg total) by mouth daily as needed. (Patient not taking: Reported on 03/21/2017) 30 capsule 0  . feeding supplement, ENSURE ENLIVE, (ENSURE ENLIVE) LIQD Take 237 mLs by mouth 2 (two) times daily between meals. 60 Bottle 0  .  ferrous sulfate 325 (65 FE) MG tablet Take 1 tablet (325 mg total) by mouth daily with breakfast. 30 tablet 0  . ondansetron (ZOFRAN) 4 MG tablet Take 1 tablet by mouth daily as needed.    . carvedilol (COREG) 6.25 MG tablet Take 1 tablet (6.25 mg total) by mouth 2 (two) times daily with a meal. 60 tablet 3  . fluconazole (DIFLUCAN) 200 MG tablet Take 1 tablet (200 mg total) by mouth daily at 6 PM. 14 tablet 0  . olaparib (LYNPARZA) 150 MG tablet Take 2 tablets (300 mg total) by mouth 2 (two) times daily. Swallow whole. (Patient not taking: Reported on 03/20/2017) 120 tablet 6  . potassium chloride SA (K-DUR,KLOR-CON) 20 MEQ tablet Take 1 tablet (20 mEq total) by mouth 2 (two) times daily. (Patient not taking: Reported on 03/06/2017) 14 tablet 0   No current facility-administered medications for this visit.     PHYSICAL EXAMINATION: ECOG PERFORMANCE STATUS: 2 - Symptomatic, <50% confined to bed  BP 96/65   Pulse (!) 130   Temp 97.6 F (36.4 C) (Tympanic)   Resp 16   LMP 03/01/2015 Comment: history of bilat ovary removal  There were no vitals filed for this visit.  GENERAL: moderately nourished well-developed; Alert, no distress and comfortable.   With her husband. She is in a wheelchair. Anasarca noted EYES: no pallor or icterus OROPHARYNX: no thrush or ulceration; good dentition  NECK: supple, no masses felt LYMPH:  no palpable lymphadenopathy in the cervical, axillary or inguinal regions LUNGS: Decreased breath sounds to auscultation and  No wheeze or crackles HEART/CVS: regular rate & rhythm and no murmurs; 3+ bilateral lower extremity edema ABDOMEN:abdomen soft, non-tender and normal bowel sounds. Wound VAC is out. Musculoskeletal:no cyanosis of digits and no clubbing. Mild left upper extremity swelling/PICC line in place nontender PSYCH: alert & oriented x 3 with fluent speech NEURO: no focal motor/sensory deficits SKIN:  no rashes or significant lesions  LABORATORY DATA:  I  have reviewed the data as listed    Component Value Date/Time   NA 132 (L) 03/06/2017 0914   K 3.2 (L) 03/06/2017 0914   CL 96 (L) 03/06/2017 0914   CO2 25 03/06/2017 0914   GLUCOSE 99 03/06/2017 0914   BUN 8 03/06/2017 0914   CREATININE 0.67 03/06/2017 0914   CALCIUM 7.9 (L) 03/06/2017 0914   PROT 5.1 (L) 02/28/2017 1825   ALBUMIN 1.8 (L) 02/28/2017 1825   AST 39 02/28/2017 1825   ALT 29 02/28/2017 1825   ALKPHOS 143 (H) 02/28/2017 1825   BILITOT 0.5 02/28/2017 1825   GFRNONAA >60 03/06/2017 0914   GFRAA >60 03/06/2017 0914    No results found for: SPEP, UPEP  Lab Results  Component Value Date   WBC 69.3 (HH) 03/06/2017   NEUTROABS 67.2 (H) 03/06/2017   HGB 9.2 (L) 03/06/2017   HCT 27.8 (L) 03/06/2017   MCV 82.9 03/06/2017   PLT 763 (H) 03/06/2017  Chemistry      Component Value Date/Time   NA 132 (L) 03/06/2017 0914   K 3.2 (L) 03/06/2017 0914   CL 96 (L) 03/06/2017 0914   CO2 25 03/06/2017 0914   BUN 8 03/06/2017 0914   CREATININE 0.67 03/06/2017 0914      Component Value Date/Time   CALCIUM 7.9 (L) 03/06/2017 0914   ALKPHOS 143 (H) 02/28/2017 1825   AST 39 02/28/2017 1825   ALT 29 02/28/2017 1825   BILITOT 0.5 02/28/2017 1825       RADIOGRAPHIC STUDIES: I have personally reviewed the radiological images as listed and agreed with the findings in the report. No results found.   ASSESSMENT & PLAN:  Carcinoma of unknown primary ([place]) # CUP- PDL-1 50%. Tolerated fairly well. S/p cycle #2 appx 3 week ago. However, July 5th CT C/A/P- show progressive disease in the liver; lungs; small bilateral pleural effusion/baseline consolidation; soft tissue mass in the pelvis [facility] discussion below].  # I had a long discussion the patient and her husband regarding the concerns for progressive disease/worsening malignancy causing the multitude of symptoms. After lengthy discussion- patient/ husband agreed to proceed with Keytruda cycle #3 next week- once PICC line is  replaced. Also recommend starting Lynparza at 300 mg once a day dose-discussed the potential side effects.  # Central line/PICC line- malfunction;  recommend replacement.  #  Shortness of breath/cough -Tachycardia/hypotension- likely secondary to third space/ intravascular depletion. Recommend stat chest x-ray.  # Follow up again next week/symptom management.  # 40 minutes face-to-face with the patient discussing the above plan of care; more than 50% of time spent on prognosis/ natural history; counseling and coordination.   Orders Placed This Encounter  Procedures  . DG Chest 2 View    Standing Status:   Future    Number of Occurrences:   1    Standing Expiration Date:   03/20/2018    Order Specific Question:   Reason for Exam (SYMPTOM  OR DIAGNOSIS REQUIRED)    Answer:   Verification of pic placement    Order Specific Question:   Is patient pregnant?    Answer:   No    Order Specific Question:   Preferred imaging location?    Answer:   [facility]    Order Specific Question:   Call Results- Best Contact Number?    Answer:   [phone]    Order Specific Question:   Radiology Contrast Protocol - do NOT remove file path    Answer:   [email or web address]   All questions were answered. The patient knows to call the clinic with any problems, questions or concerns.      Cammie Sickle, MD 03/25/2017 8:14 AM

## 2017-03-20 NOTE — Telephone Encounter (Signed)
Pt to come Monday 03/24/17-8:30 am for lab/md/keytruda.  Patient will have left picc line removed in sds tom 7/27 and new line placed in left arm- spoke with Pat in [place]. Patient will hold her Eliquis tonight and tom. Am in preparation for the picc line procedure. picc line orders entered in SDS.   Brooke, CMA spoke with patient. - Patient notified of Dr. Aletha Halim comments and verbalized understanding. Patient notified to be at appt at same day surgery for PICC line removal and placement tomorrow at 11:30am. Read back process was completed and patient verbalized understanding.

## 2017-03-21 ENCOUNTER — Ambulatory Visit
Admission: RE | Admit: 2017-03-21 | Discharge: 2017-03-21 | Disposition: A | Discharge status: Home / Self Care | Payer: Managed Care, Other (non HMO) | Source: Ambulatory Visit | Attending: Internal Medicine | Admitting: Internal Medicine

## 2017-03-21 ENCOUNTER — Telehealth: Payer: Self-pay | Admitting: Internal Medicine

## 2017-03-21 DIAGNOSIS — T82598A Other mechanical complication of other cardiac and vascular devices and implants, initial encounter: Secondary | ICD-10-CM | POA: Diagnosis present

## 2017-03-21 DIAGNOSIS — T82524A Displacement of infusion catheter, initial encounter: Secondary | ICD-10-CM

## 2017-03-21 DIAGNOSIS — Z452 Encounter for adjustment and management of vascular access device: Secondary | ICD-10-CM | POA: Insufficient documentation

## 2017-03-21 DIAGNOSIS — T82528A Displacement of other cardiac and vascular devices and implants, initial encounter: Secondary | ICD-10-CM

## 2017-03-21 NOTE — Telephone Encounter (Signed)
Long discussion with the patient and family- in same day waiting area. Given the significant decline in the performance status/Progression of disease- patient is interested in hospice/withholding any active treatments. I think it's reasonable. Recommend hospice evaluation over the weekend. We will make hospice referral ASAP. Will discontinue PICC line. Discontinue antibiotics.  # Given patient was BRCA2 positive- on the tumor sampling. Patient has 2 brothers/father- we'll need to be referred for genetic counseling/testing. Discussed at length with the patient and her husband. They agree to inform the family.

## 2017-03-21 NOTE — OR Nursing (Signed)
Dr. Burlene Arnt notified per Nigel Bridgeman RN of patient arrival to unit, VS and that patient and husband would like to talk with him to discuss plan of care.  Patient offered oxygen, patient refused at this time.

## 2017-03-21 NOTE — Telephone Encounter (Signed)
Hospice orders faxed to [facility].

## 2017-03-21 NOTE — Telephone Encounter (Signed)
Dr. Jacinto Reap provided hand off to Magda Paganini, NP at [facility]

## 2017-03-21 NOTE — OR Nursing (Signed)
Dr Rogue Bussing in to see patient, had a long discussion with patient and husband about plan of care.  Patient desires to have hospice care and does not want to be admitted to the hospital at this time.  Dr Rogue Bussing is contacting hospice to have someone see her today.  Dr Burlene Arnt also ordered PICC  line to be removed today and not be replaced as originally planned.  Patient and husband both in agreement with plan of care.

## 2017-03-21 NOTE — OR Nursing (Signed)
PICC removed per vascular wellness team.  Patient and husband desire to go home at this time.

## 2017-03-24 ENCOUNTER — Inpatient Hospital Stay: Payer: Managed Care, Other (non HMO) | Admitting: Internal Medicine

## 2017-03-24 ENCOUNTER — Inpatient Hospital Stay: Payer: Managed Care, Other (non HMO)

## 2017-03-26 ENCOUNTER — Telehealth: Payer: Self-pay | Admitting: *Deleted

## 2017-03-26 NOTE — Telephone Encounter (Signed)
Pharmacy called to confirm that patient was not taking Lymparza. Informed pharmacy per Dr. Andre Lefort note that patient had been discharged to hospice.

## 2017-03-26 NOTE — Telephone Encounter (Signed)
Noted  

## 2017-04-26 DEATH — deceased

## 2017-07-31 ENCOUNTER — Other Ambulatory Visit: Payer: Self-pay | Admitting: Nurse Practitioner

## 2017-11-20 IMAGING — CT CT ABD-PELV W/ CM
3 of 11 series · 11 of 46 positions shown, 17 images · IV contrast (isovue)
Comparison: CT scan of January 21, 2017.

CLINICAL DATA: Fever, tachycardia.

EXAM:
CT ANGIOGRAPHY CHEST
CT ABDOMEN AND PELVIS WITH CONTRAST
TECHNIQUE: Multidetector CT imaging of the chest was performed using the
standard protocol during bolus administration of intravenous
contrast. Multiplanar CT image reconstructions and MIPs were
obtained to evaluate the vascular anatomy. Multidetector CT imaging
of the abdomen and pelvis was performed using the standard protocol
during bolus administration of intravenous contrast.
CONTRAST:  100 mL of Isovue 370 intravenously.

[Series 5: thins · axial · 0.71mm/px · z∈[-294,-167]mm · 5 of 255 slices shown]
[im 16/255  soft-tissue]
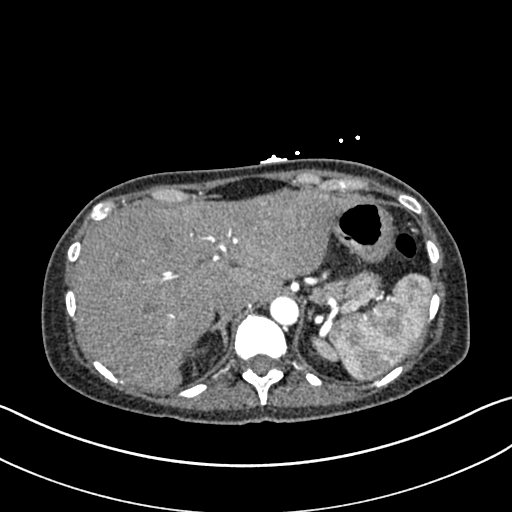
[im 48/255  soft-tissue]
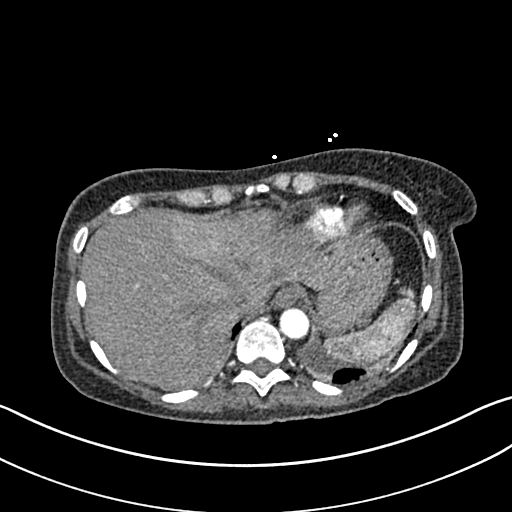
[im 80/255  soft-tissue]
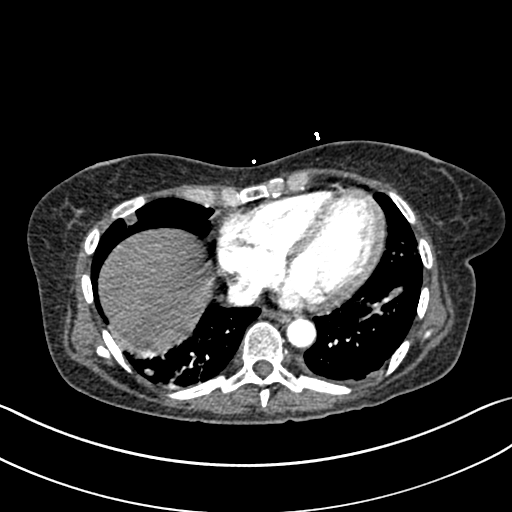
[im 112/255  soft-tissue]
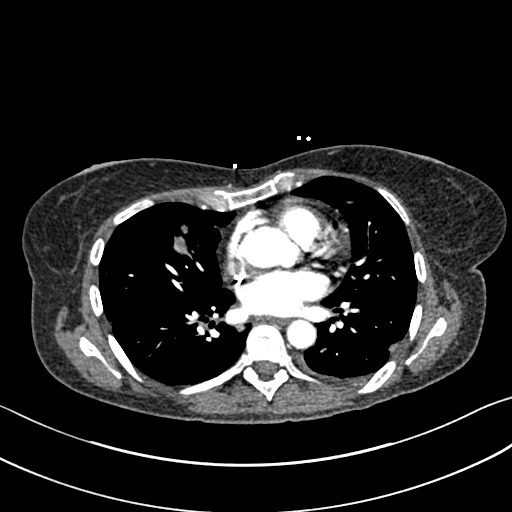
[im 143/255  soft-tissue]
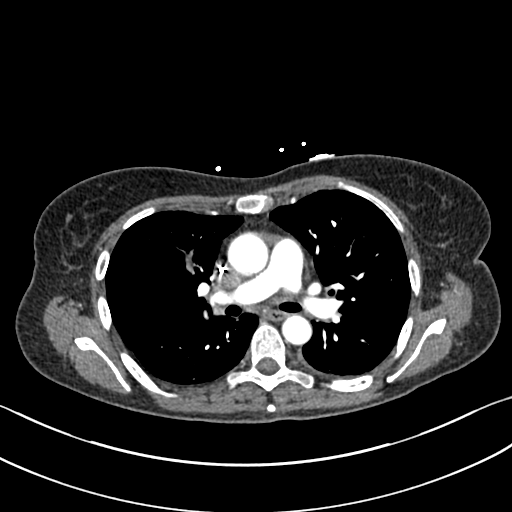

[Series 7: coronal mpr · coronal · 0.53mm/px · 2 of 97 slices shown, 3 images]
[im 33/97  soft-tissue]
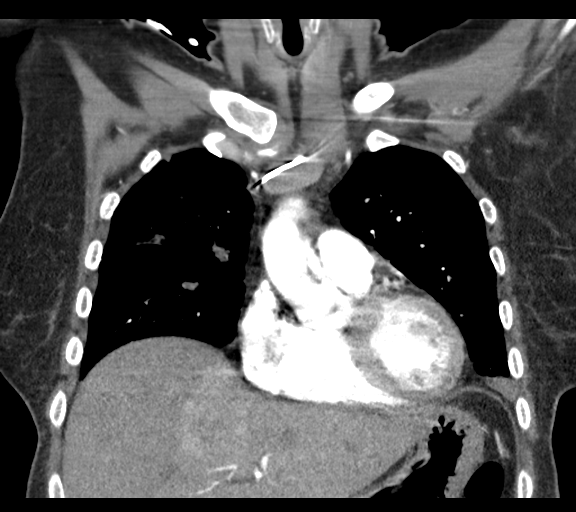
[im 33/97  bone]
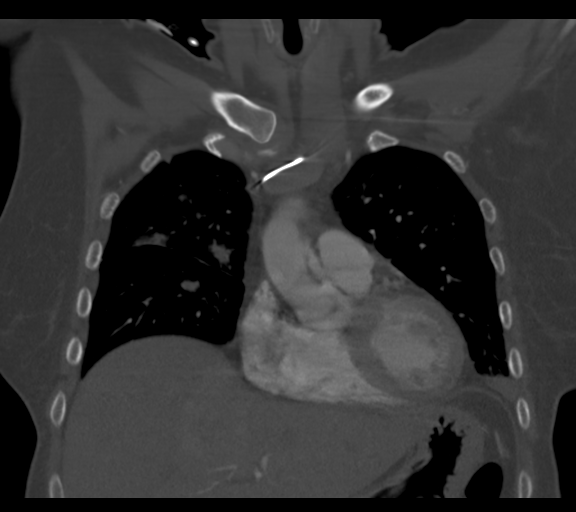
[im 65/97  soft-tissue]
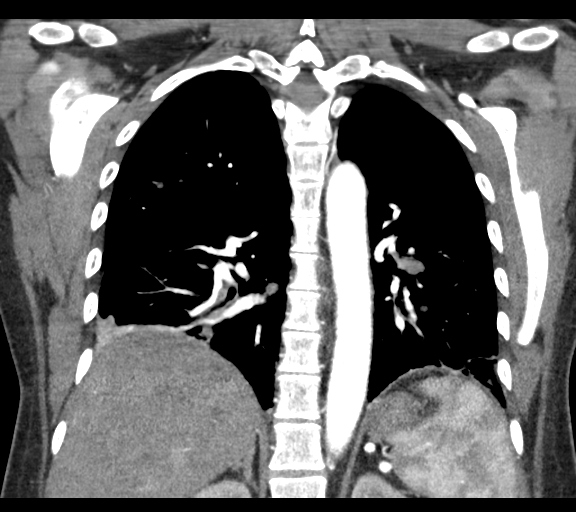

[Series 11: axial st · axial · 0.70mm/px · z∈[-557,-287]mm · 4 of 92 slices shown, 9 images]
[im 19/92  soft-tissue]
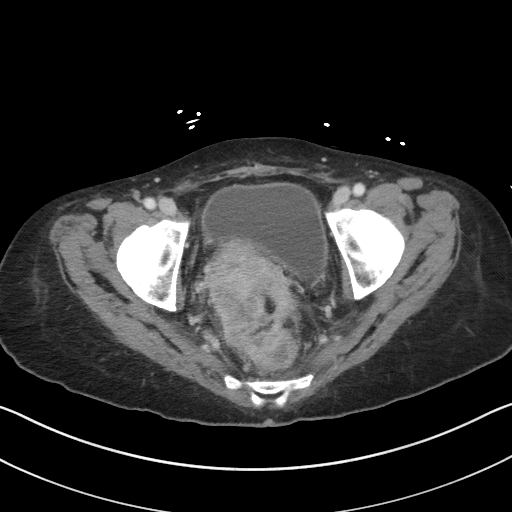
[im 19/92  lung]
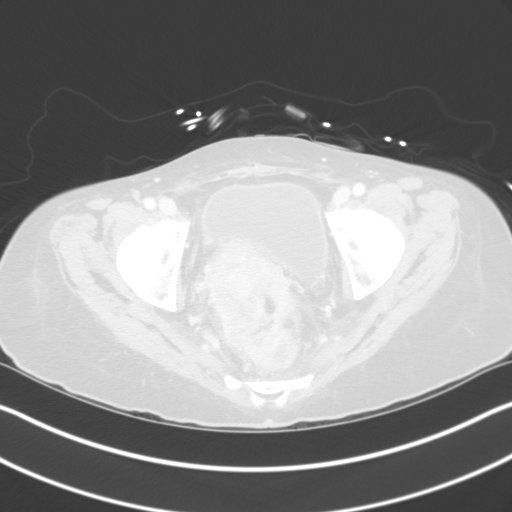
[im 19/92  bone]
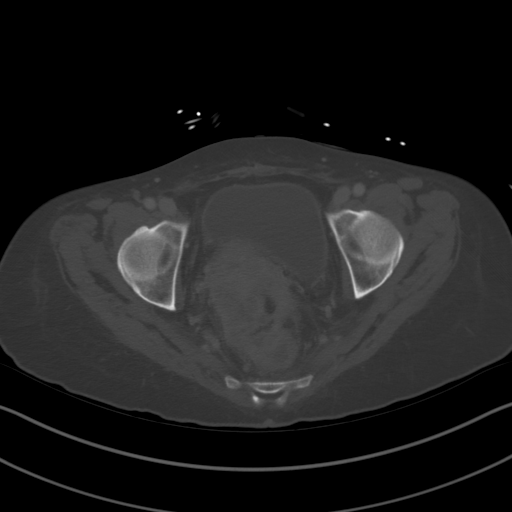
[im 37/92  soft-tissue]
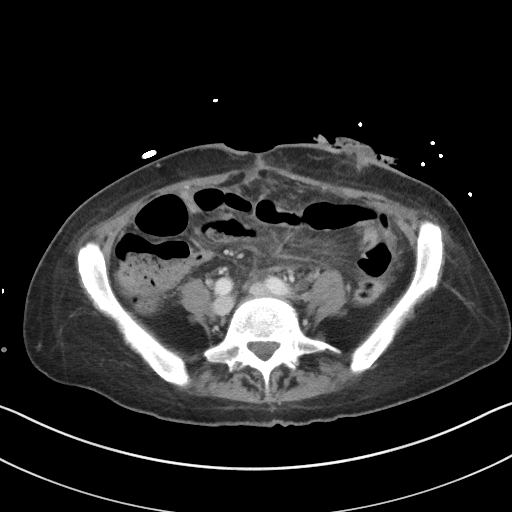
[im 37/92  lung]
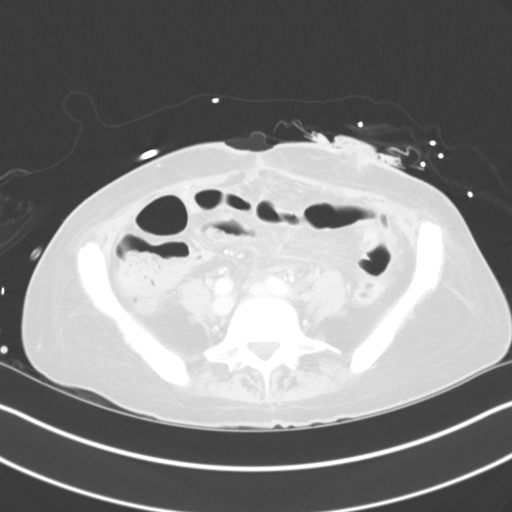
[im 55/92  soft-tissue]
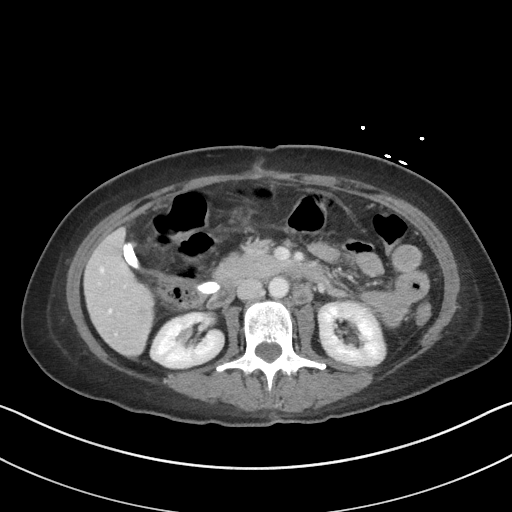
[im 55/92  lung]
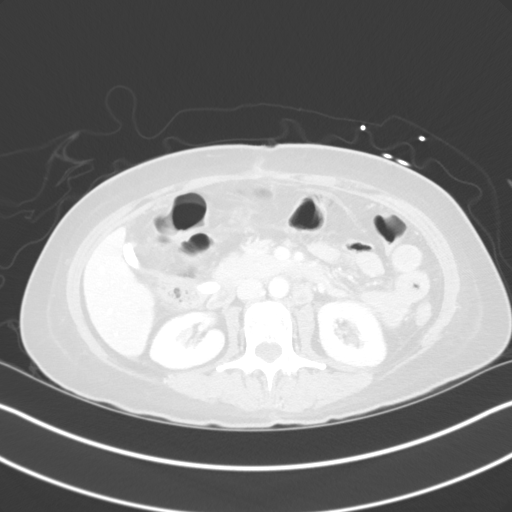
[im 73/92  soft-tissue]
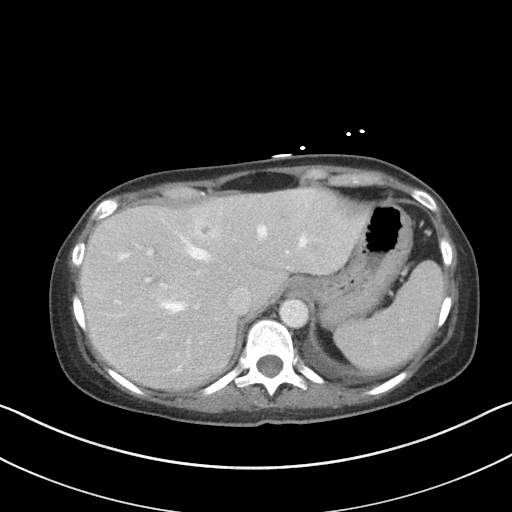
[im 73/92  lung]
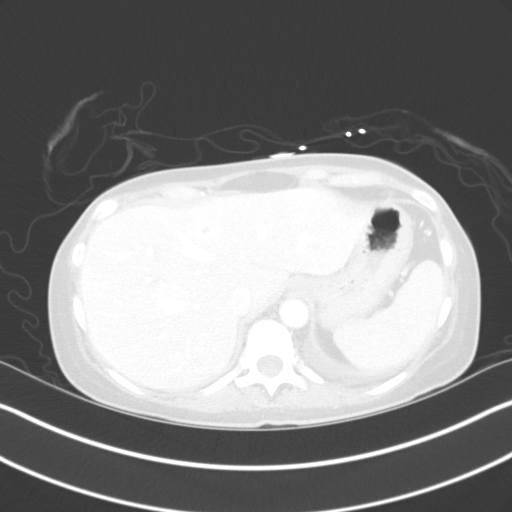

[11 of 46 positions shown; findings below may reference images not displayed]

FINDINGS: CTA CHEST FINDINGS

Cardiovascular: Satisfactory opacification of the pulmonary arteries
to the segmental level. No evidence of pulmonary embolism. Normal
heart size. No pericardial effusion.

Mediastinum/Nodes: No enlarged mediastinal, hilar, or axillary lymph
nodes. Thyroid gland, trachea, and esophagus demonstrate no
significant findings.

Lungs/Pleura: Multiple nodular densities are noted throughout both
lungs consistent with metastatic disease. Minimal bibasilar
subsegmental atelectasis is noted. No pneumothorax or pleural
effusion is noted.

Musculoskeletal: No chest wall abnormality. No acute or significant
osseous findings.

Review of the MIP images confirms the above findings.

CT ABDOMEN and PELVIS FINDINGS

Hepatobiliary: The gallstones are noted. Stable hepatic lesions are
noted consistent with metastatic disease.

Pancreas: Unremarkable. No pancreatic ductal dilatation or
surrounding inflammatory changes.

Spleen: Normal in size without focal abnormality.

Adrenals/Urinary Tract: Adrenal glands appear normal. No
hydronephrosis or renal obstruction is noted. No renal or ureteral
calculi are noted. Urinary bladder is unremarkable. Stable complex
low density is noted posteriorly in the left renal cortex concerning
for infection, infarction or possibly metastatic disease.

Stomach/Bowel: There is no evidence of bowel obstruction. Colostomy
is seen in the left lower quadrant.

Vascular/Lymphatic: No significant vascular abnormality is noted.
Left periaortic adenopathy is noted consistent with metastatic
disease. The largest measures 15 mm.

Reproductive: Reportedly, both ovaries have been removed. Probable
fibroid uterus is noted. Continued presence of inflammatory changes
seen around the uterus in the pelvis with multiple small fluid
collections, the largest measuring 33 x 21 mm. These do not appear
to be significantly changed compared to prior exam.

Other: Surgical drain remains in the subhepatic space. No fluid
collection is noted around this drain. Continued and stable presence
of pigtail drainage catheter seen in the right lower quadrant with
no significant fluid collection noted around it. Midline surgical
incision is noted.

Musculoskeletal: No acute or significant osseous findings.

Review of the MIP images confirms the above findings.
IMPRESSION: No definite evidence of pulmonary embolus.

Continued presence of multiple pulmonary and hepatic metastatic
lesions.

Stable complex low density seen posteriorly in left renal cortex
concerning for infection, infarction or possibly metastatic lesion.

Left periaortic adenopathy is noted with largest lymph node
measuring 15 mm. This is consistent with metastatic disease.

Continued presence of surgical drain seen in subhepatic space and
pigtail drainage catheter in the right lower quadrant, with no
significant fluid collection is noted around either catheter.

Continued presence of inflammatory changes in the pelvis adjacent to
the fibroid uterus, with multiple small complex fluid collections
which are not significantly changed compared to prior exam.
